# Patient Record
Sex: Male | Born: 1940
Health system: Southern US, Community
[De-identification: ages and names within clinical notes are randomized; demographics above are authoritative.]

## PROBLEM LIST (undated history)

## (undated) DIAGNOSIS — I219 Acute myocardial infarction, unspecified: Secondary | ICD-10-CM

## (undated) DIAGNOSIS — F0390 Unspecified dementia without behavioral disturbance: Secondary | ICD-10-CM

## (undated) DIAGNOSIS — K529 Noninfective gastroenteritis and colitis, unspecified: Secondary | ICD-10-CM

## (undated) DIAGNOSIS — G459 Transient cerebral ischemic attack, unspecified: Secondary | ICD-10-CM

## (undated) DIAGNOSIS — B3781 Candidal esophagitis: Secondary | ICD-10-CM

## (undated) DIAGNOSIS — K922 Gastrointestinal hemorrhage, unspecified: Secondary | ICD-10-CM

## (undated) DIAGNOSIS — E119 Type 2 diabetes mellitus without complications: Secondary | ICD-10-CM

## (undated) DIAGNOSIS — E785 Hyperlipidemia, unspecified: Secondary | ICD-10-CM

## (undated) DIAGNOSIS — K5520 Angiodysplasia of colon without hemorrhage: Secondary | ICD-10-CM

## (undated) DIAGNOSIS — I10 Essential (primary) hypertension: Secondary | ICD-10-CM

## (undated) DIAGNOSIS — I2510 Atherosclerotic heart disease of native coronary artery without angina pectoris: Secondary | ICD-10-CM

## (undated) DIAGNOSIS — J984 Other disorders of lung: Secondary | ICD-10-CM

## (undated) DIAGNOSIS — G4730 Sleep apnea, unspecified: Secondary | ICD-10-CM

## (undated) DIAGNOSIS — I6523 Occlusion and stenosis of bilateral carotid arteries: Secondary | ICD-10-CM

## (undated) DIAGNOSIS — K3189 Other diseases of stomach and duodenum: Secondary | ICD-10-CM

## (undated) DIAGNOSIS — F32A Depression, unspecified: Secondary | ICD-10-CM

## (undated) DIAGNOSIS — N400 Enlarged prostate without lower urinary tract symptoms: Secondary | ICD-10-CM

## (undated) DIAGNOSIS — J449 Chronic obstructive pulmonary disease, unspecified: Secondary | ICD-10-CM

## (undated) DIAGNOSIS — D649 Anemia, unspecified: Secondary | ICD-10-CM

## (undated) DIAGNOSIS — K766 Portal hypertension: Secondary | ICD-10-CM

## (undated) DIAGNOSIS — K219 Gastro-esophageal reflux disease without esophagitis: Secondary | ICD-10-CM

## (undated) DIAGNOSIS — F329 Major depressive disorder, single episode, unspecified: Secondary | ICD-10-CM

## (undated) HISTORY — DX: Gastrointestinal hemorrhage, unspecified: K92.2

## (undated) HISTORY — DX: Atherosclerotic heart disease of native coronary artery without angina pectoris: I25.10

## (undated) HISTORY — DX: Occlusion and stenosis of bilateral carotid arteries: I65.23

## (undated) HISTORY — DX: Candidal esophagitis: B37.81

## (undated) HISTORY — DX: Type 2 diabetes mellitus without complications: E11.9

## (undated) HISTORY — PX: OTHER SURGICAL HISTORY: SHX169

## (undated) HISTORY — DX: Gastro-esophageal reflux disease without esophagitis: K21.9

## (undated) HISTORY — DX: Depression, unspecified: F32.A

## (undated) HISTORY — PX: CORONARY ANGIOPLASTY WITH STENT PLACEMENT: SHX49

## (undated) HISTORY — DX: Anemia, unspecified: D64.9

## (undated) HISTORY — PX: COLONOSCOPY: SHX174

## (undated) HISTORY — DX: Essential (primary) hypertension: I10

## (undated) HISTORY — PX: CORONARY ARTERY BYPASS GRAFT: SHX141

## (undated) HISTORY — DX: Other disorders of lung: J98.4

## (undated) HISTORY — DX: Benign prostatic hyperplasia without lower urinary tract symptoms: N40.0

## (undated) HISTORY — DX: Noninfective gastroenteritis and colitis, unspecified: K52.9

## (undated) HISTORY — PX: CARDIAC CATHETERIZATION: SHX172

## (undated) HISTORY — DX: Major depressive disorder, single episode, unspecified: F32.9

## (undated) HISTORY — DX: Angiodysplasia of colon without hemorrhage: K55.20

## (undated) HISTORY — DX: Transient cerebral ischemic attack, unspecified: G45.9

## (undated) HISTORY — DX: Hyperlipidemia, unspecified: E78.5

---

## 2000-10-17 ENCOUNTER — Emergency Department (HOSPITAL_COMMUNITY): Admission: EM | Admit: 2000-10-17 | Discharge: 2000-10-17 | Payer: Self-pay | Admitting: Emergency Medicine

## 2005-12-21 ENCOUNTER — Emergency Department (HOSPITAL_COMMUNITY): Admission: EM | Admit: 2005-12-21 | Discharge: 2005-12-21 | Payer: Self-pay | Admitting: Emergency Medicine

## 2006-05-04 ENCOUNTER — Inpatient Hospital Stay (HOSPITAL_COMMUNITY): Admission: EM | Admit: 2006-05-04 | Discharge: 2006-05-07 | Payer: Self-pay | Admitting: Emergency Medicine

## 2006-05-04 ENCOUNTER — Ambulatory Visit: Payer: Self-pay | Admitting: Internal Medicine

## 2006-05-04 ENCOUNTER — Ambulatory Visit: Payer: Self-pay | Admitting: Cardiology

## 2006-05-05 ENCOUNTER — Ambulatory Visit: Payer: Self-pay | Admitting: Gastroenterology

## 2006-05-05 ENCOUNTER — Encounter (INDEPENDENT_AMBULATORY_CARE_PROVIDER_SITE_OTHER): Payer: Self-pay | Admitting: Specialist

## 2006-05-07 ENCOUNTER — Ambulatory Visit: Payer: Self-pay | Admitting: Gastroenterology

## 2006-05-15 ENCOUNTER — Emergency Department (HOSPITAL_COMMUNITY): Admission: EM | Admit: 2006-05-15 | Discharge: 2006-05-15 | Payer: Self-pay | Admitting: Emergency Medicine

## 2006-05-21 ENCOUNTER — Ambulatory Visit (HOSPITAL_COMMUNITY): Admission: RE | Admit: 2006-05-21 | Discharge: 2006-05-21 | Payer: Self-pay | Admitting: Gastroenterology

## 2006-05-27 ENCOUNTER — Ambulatory Visit: Payer: Self-pay | Admitting: Gastroenterology

## 2006-06-22 HISTORY — PX: COLONOSCOPY: SHX174

## 2006-06-24 ENCOUNTER — Ambulatory Visit: Payer: Self-pay | Admitting: *Deleted

## 2006-06-28 ENCOUNTER — Ambulatory Visit: Payer: Self-pay | Admitting: Gastroenterology

## 2006-07-12 ENCOUNTER — Encounter (INDEPENDENT_AMBULATORY_CARE_PROVIDER_SITE_OTHER): Payer: Self-pay | Admitting: Specialist

## 2006-07-12 ENCOUNTER — Ambulatory Visit (HOSPITAL_COMMUNITY): Admission: RE | Admit: 2006-07-12 | Discharge: 2006-07-12 | Payer: Self-pay | Admitting: Gastroenterology

## 2006-07-21 ENCOUNTER — Ambulatory Visit: Payer: Self-pay | Admitting: Gastroenterology

## 2007-01-13 ENCOUNTER — Ambulatory Visit: Payer: Self-pay | Admitting: Gastroenterology

## 2007-01-20 ENCOUNTER — Ambulatory Visit: Payer: Self-pay | Admitting: *Deleted

## 2007-03-24 HISTORY — PX: OTHER SURGICAL HISTORY: SHX169

## 2007-06-21 ENCOUNTER — Ambulatory Visit: Payer: Self-pay | Admitting: Gastroenterology

## 2007-08-08 ENCOUNTER — Ambulatory Visit (HOSPITAL_COMMUNITY): Admission: RE | Admit: 2007-08-08 | Discharge: 2007-08-08 | Payer: Self-pay | Admitting: Internal Medicine

## 2007-08-10 ENCOUNTER — Encounter (HOSPITAL_COMMUNITY): Admission: RE | Admit: 2007-08-10 | Discharge: 2007-09-09 | Payer: Self-pay | Admitting: Internal Medicine

## 2007-08-11 ENCOUNTER — Emergency Department (HOSPITAL_COMMUNITY): Admission: EM | Admit: 2007-08-11 | Discharge: 2007-08-11 | Payer: Self-pay | Admitting: Emergency Medicine

## 2007-08-11 ENCOUNTER — Ambulatory Visit (HOSPITAL_COMMUNITY): Payer: Self-pay | Admitting: Oncology

## 2007-08-17 ENCOUNTER — Ambulatory Visit: Payer: Self-pay | Admitting: Gastroenterology

## 2007-10-13 ENCOUNTER — Ambulatory Visit: Payer: Self-pay | Admitting: Gastroenterology

## 2007-10-14 ENCOUNTER — Encounter (HOSPITAL_COMMUNITY): Admission: RE | Admit: 2007-10-14 | Discharge: 2007-11-13 | Payer: Self-pay | Admitting: Gastroenterology

## 2007-10-26 ENCOUNTER — Ambulatory Visit (HOSPITAL_COMMUNITY): Admission: RE | Admit: 2007-10-26 | Discharge: 2007-10-26 | Payer: Self-pay | Admitting: Gastroenterology

## 2007-10-30 ENCOUNTER — Ambulatory Visit: Payer: Self-pay | Admitting: Gastroenterology

## 2007-11-22 HISTORY — PX: ESOPHAGOGASTRODUODENOSCOPY: SHX1529

## 2007-11-22 HISTORY — PX: UPPER GASTROINTESTINAL ENDOSCOPY: SHX188

## 2007-11-23 ENCOUNTER — Ambulatory Visit (HOSPITAL_COMMUNITY): Payer: Self-pay | Admitting: Internal Medicine

## 2007-11-23 ENCOUNTER — Encounter (HOSPITAL_COMMUNITY): Admission: RE | Admit: 2007-11-23 | Discharge: 2007-12-19 | Payer: Self-pay | Admitting: Internal Medicine

## 2007-11-23 ENCOUNTER — Ambulatory Visit: Payer: Self-pay | Admitting: Gastroenterology

## 2007-11-30 ENCOUNTER — Ambulatory Visit: Payer: Self-pay | Admitting: Gastroenterology

## 2007-11-30 ENCOUNTER — Ambulatory Visit (HOSPITAL_COMMUNITY): Admission: RE | Admit: 2007-11-30 | Discharge: 2007-11-30 | Payer: Self-pay | Admitting: Gastroenterology

## 2007-11-30 ENCOUNTER — Encounter: Payer: Self-pay | Admitting: Gastroenterology

## 2007-12-06 ENCOUNTER — Ambulatory Visit (HOSPITAL_COMMUNITY): Admission: RE | Admit: 2007-12-06 | Discharge: 2007-12-06 | Payer: Self-pay | Admitting: Family Medicine

## 2008-02-22 ENCOUNTER — Ambulatory Visit: Payer: Self-pay | Admitting: Gastroenterology

## 2008-05-21 HISTORY — PX: OTHER SURGICAL HISTORY: SHX169

## 2008-05-25 ENCOUNTER — Encounter: Payer: Self-pay | Admitting: Gastroenterology

## 2008-05-25 ENCOUNTER — Ambulatory Visit: Payer: Self-pay | Admitting: Gastroenterology

## 2008-05-25 DIAGNOSIS — K5900 Constipation, unspecified: Secondary | ICD-10-CM | POA: Insufficient documentation

## 2008-05-25 DIAGNOSIS — R197 Diarrhea, unspecified: Secondary | ICD-10-CM | POA: Insufficient documentation

## 2008-05-25 DIAGNOSIS — K219 Gastro-esophageal reflux disease without esophagitis: Secondary | ICD-10-CM | POA: Insufficient documentation

## 2008-05-25 DIAGNOSIS — D500 Iron deficiency anemia secondary to blood loss (chronic): Secondary | ICD-10-CM | POA: Insufficient documentation

## 2008-05-31 ENCOUNTER — Telehealth (INDEPENDENT_AMBULATORY_CARE_PROVIDER_SITE_OTHER): Payer: Self-pay | Admitting: *Deleted

## 2008-06-08 ENCOUNTER — Encounter: Payer: Self-pay | Admitting: Gastroenterology

## 2008-06-08 LAB — CONVERTED CEMR LAB
Ferritin: 8 ng/mL — ABNORMAL LOW (ref 22–322)
HCT: 37.5 % — ABNORMAL LOW (ref 39.0–52.0)
Hemoglobin: 11.3 g/dL — ABNORMAL LOW (ref 13.0–17.0)

## 2008-06-18 ENCOUNTER — Ambulatory Visit (HOSPITAL_COMMUNITY): Admission: RE | Admit: 2008-06-18 | Discharge: 2008-06-18 | Payer: Self-pay | Admitting: Gastroenterology

## 2008-06-20 ENCOUNTER — Ambulatory Visit: Payer: Self-pay | Admitting: Gastroenterology

## 2008-07-06 ENCOUNTER — Encounter: Payer: Self-pay | Admitting: Gastroenterology

## 2008-07-09 ENCOUNTER — Encounter: Payer: Self-pay | Admitting: Gastroenterology

## 2008-07-19 ENCOUNTER — Emergency Department (HOSPITAL_COMMUNITY): Admission: EM | Admit: 2008-07-19 | Discharge: 2008-07-19 | Payer: Self-pay | Admitting: Emergency Medicine

## 2008-07-20 ENCOUNTER — Telehealth (INDEPENDENT_AMBULATORY_CARE_PROVIDER_SITE_OTHER): Payer: Self-pay

## 2008-07-22 ENCOUNTER — Emergency Department (HOSPITAL_COMMUNITY): Admission: EM | Admit: 2008-07-22 | Discharge: 2008-07-22 | Payer: Self-pay | Admitting: Emergency Medicine

## 2008-08-06 ENCOUNTER — Encounter (INDEPENDENT_AMBULATORY_CARE_PROVIDER_SITE_OTHER): Payer: Self-pay

## 2008-08-17 ENCOUNTER — Encounter: Payer: Self-pay | Admitting: Gastroenterology

## 2008-08-21 ENCOUNTER — Telehealth: Payer: Self-pay | Admitting: Gastroenterology

## 2008-09-10 ENCOUNTER — Encounter: Payer: Self-pay | Admitting: Gastroenterology

## 2008-09-12 LAB — CONVERTED CEMR LAB: Hemoglobin: 11.3 g/dL — ABNORMAL LOW (ref 13.0–17.0)

## 2008-09-13 DIAGNOSIS — K200 Eosinophilic esophagitis: Secondary | ICD-10-CM | POA: Insufficient documentation

## 2008-09-13 DIAGNOSIS — K7689 Other specified diseases of liver: Secondary | ICD-10-CM | POA: Insufficient documentation

## 2008-09-13 DIAGNOSIS — F17200 Nicotine dependence, unspecified, uncomplicated: Secondary | ICD-10-CM | POA: Insufficient documentation

## 2008-09-13 DIAGNOSIS — D126 Benign neoplasm of colon, unspecified: Secondary | ICD-10-CM | POA: Insufficient documentation

## 2008-09-13 DIAGNOSIS — K648 Other hemorrhoids: Secondary | ICD-10-CM | POA: Insufficient documentation

## 2008-09-13 DIAGNOSIS — E119 Type 2 diabetes mellitus without complications: Secondary | ICD-10-CM | POA: Insufficient documentation

## 2008-09-19 ENCOUNTER — Ambulatory Visit: Payer: Self-pay | Admitting: Gastroenterology

## 2008-09-20 ENCOUNTER — Encounter: Payer: Self-pay | Admitting: Gastroenterology

## 2008-09-20 DIAGNOSIS — Q279 Congenital malformation of peripheral vascular system, unspecified: Secondary | ICD-10-CM | POA: Insufficient documentation

## 2008-09-27 ENCOUNTER — Ambulatory Visit (HOSPITAL_COMMUNITY): Payer: Self-pay | Admitting: Oncology

## 2008-09-27 ENCOUNTER — Encounter (HOSPITAL_COMMUNITY): Admission: RE | Admit: 2008-09-27 | Discharge: 2008-10-27 | Payer: Self-pay | Admitting: Oncology

## 2008-12-03 ENCOUNTER — Ambulatory Visit (HOSPITAL_COMMUNITY): Payer: Self-pay | Admitting: Oncology

## 2008-12-03 ENCOUNTER — Encounter (HOSPITAL_COMMUNITY): Admission: RE | Admit: 2008-12-03 | Discharge: 2008-12-19 | Payer: Self-pay | Admitting: Oncology

## 2009-01-16 ENCOUNTER — Ambulatory Visit: Payer: Self-pay | Admitting: Gastroenterology

## 2009-03-04 ENCOUNTER — Ambulatory Visit (HOSPITAL_COMMUNITY): Payer: Self-pay | Admitting: Oncology

## 2009-03-04 ENCOUNTER — Encounter (HOSPITAL_COMMUNITY): Admission: RE | Admit: 2009-03-04 | Discharge: 2009-03-20 | Payer: Self-pay | Admitting: Oncology

## 2009-04-17 ENCOUNTER — Encounter (HOSPITAL_COMMUNITY): Admission: RE | Admit: 2009-04-17 | Discharge: 2009-05-17 | Payer: Self-pay | Admitting: Oncology

## 2009-04-19 ENCOUNTER — Ambulatory Visit (HOSPITAL_COMMUNITY): Payer: Self-pay | Admitting: Oncology

## 2009-05-07 ENCOUNTER — Ambulatory Visit: Payer: Self-pay | Admitting: Gastroenterology

## 2009-06-21 ENCOUNTER — Ambulatory Visit (HOSPITAL_COMMUNITY): Payer: Self-pay | Admitting: Oncology

## 2009-06-21 ENCOUNTER — Encounter (HOSPITAL_COMMUNITY): Admission: RE | Admit: 2009-06-21 | Discharge: 2009-07-21 | Payer: Self-pay | Admitting: Oncology

## 2009-08-23 ENCOUNTER — Ambulatory Visit (HOSPITAL_COMMUNITY): Payer: Self-pay | Admitting: Oncology

## 2009-08-23 ENCOUNTER — Encounter (HOSPITAL_COMMUNITY): Admission: RE | Admit: 2009-08-23 | Discharge: 2009-09-22 | Payer: Self-pay | Admitting: Oncology

## 2009-09-04 ENCOUNTER — Encounter: Payer: Self-pay | Admitting: Gastroenterology

## 2009-10-18 ENCOUNTER — Ambulatory Visit (HOSPITAL_COMMUNITY): Payer: Self-pay | Admitting: Oncology

## 2009-10-18 ENCOUNTER — Encounter (HOSPITAL_COMMUNITY)
Admission: RE | Admit: 2009-10-18 | Discharge: 2009-11-17 | Payer: Self-pay | Source: Home / Self Care | Admitting: Oncology

## 2009-11-20 ENCOUNTER — Ambulatory Visit: Payer: Self-pay | Admitting: Gastroenterology

## 2009-11-20 DIAGNOSIS — K589 Irritable bowel syndrome without diarrhea: Secondary | ICD-10-CM | POA: Insufficient documentation

## 2009-11-21 DIAGNOSIS — K529 Noninfective gastroenteritis and colitis, unspecified: Secondary | ICD-10-CM

## 2009-11-21 HISTORY — DX: Noninfective gastroenteritis and colitis, unspecified: K52.9

## 2009-12-04 ENCOUNTER — Ambulatory Visit (HOSPITAL_COMMUNITY): Payer: Self-pay | Admitting: Oncology

## 2009-12-04 ENCOUNTER — Encounter (HOSPITAL_COMMUNITY)
Admission: RE | Admit: 2009-12-04 | Discharge: 2010-01-03 | Payer: Self-pay | Source: Home / Self Care | Admitting: Oncology

## 2009-12-16 ENCOUNTER — Encounter: Payer: Self-pay | Admitting: *Deleted

## 2009-12-17 ENCOUNTER — Encounter: Payer: Self-pay | Admitting: Cardiology

## 2009-12-17 ENCOUNTER — Encounter (INDEPENDENT_AMBULATORY_CARE_PROVIDER_SITE_OTHER): Payer: Self-pay | Admitting: *Deleted

## 2009-12-24 ENCOUNTER — Ambulatory Visit: Payer: Self-pay | Admitting: Cardiology

## 2009-12-25 ENCOUNTER — Encounter: Payer: Self-pay | Admitting: Cardiology

## 2010-01-02 ENCOUNTER — Encounter: Payer: Self-pay | Admitting: Gastroenterology

## 2010-01-03 ENCOUNTER — Ambulatory Visit: Payer: Self-pay | Admitting: Cardiology

## 2010-01-03 ENCOUNTER — Encounter: Payer: Self-pay | Admitting: Cardiology

## 2010-01-03 ENCOUNTER — Encounter (HOSPITAL_COMMUNITY)
Admission: RE | Admit: 2010-01-03 | Discharge: 2010-02-02 | Payer: Self-pay | Source: Home / Self Care | Admitting: Cardiology

## 2010-01-07 ENCOUNTER — Encounter: Payer: Self-pay | Admitting: Cardiology

## 2010-01-08 ENCOUNTER — Encounter (INDEPENDENT_AMBULATORY_CARE_PROVIDER_SITE_OTHER): Payer: Self-pay | Admitting: *Deleted

## 2010-01-08 ENCOUNTER — Encounter: Payer: Self-pay | Admitting: Cardiology

## 2010-01-15 ENCOUNTER — Ambulatory Visit: Payer: Self-pay | Admitting: Cardiology

## 2010-01-21 ENCOUNTER — Encounter: Payer: Self-pay | Admitting: Cardiology

## 2010-01-29 ENCOUNTER — Encounter: Payer: Self-pay | Admitting: Cardiology

## 2010-02-12 ENCOUNTER — Encounter: Payer: Self-pay | Admitting: Cardiology

## 2010-02-21 ENCOUNTER — Ambulatory Visit (HOSPITAL_COMMUNITY): Payer: Self-pay | Admitting: Oncology

## 2010-02-21 ENCOUNTER — Encounter (HOSPITAL_COMMUNITY)
Admission: RE | Admit: 2010-02-21 | Discharge: 2010-03-23 | Payer: Self-pay | Source: Home / Self Care | Attending: Oncology | Admitting: Oncology

## 2010-02-27 ENCOUNTER — Emergency Department (HOSPITAL_COMMUNITY): Admission: EM | Admit: 2010-02-27 | Discharge: 2009-12-07 | Payer: Self-pay | Admitting: Emergency Medicine

## 2010-03-25 ENCOUNTER — Ambulatory Visit
Admission: RE | Admit: 2010-03-25 | Discharge: 2010-03-25 | Payer: Self-pay | Source: Home / Self Care | Attending: Gastroenterology | Admitting: Gastroenterology

## 2010-03-25 ENCOUNTER — Telehealth (INDEPENDENT_AMBULATORY_CARE_PROVIDER_SITE_OTHER): Payer: Self-pay

## 2010-04-02 ENCOUNTER — Encounter (HOSPITAL_COMMUNITY)
Admission: RE | Admit: 2010-04-02 | Discharge: 2010-04-22 | Payer: Self-pay | Source: Home / Self Care | Attending: Oncology | Admitting: Oncology

## 2010-04-22 NOTE — Miscellaneous (Signed)
Summary: echo 01/03/2010  Clinical Lists Changes  Observations: Added new observation of ECHOINTERP:  Study Conclusions    - Left ventricle: The cavity size was normal. Wall thickness was     increased in a pattern of moderate LVH. The estimated ejection     fraction was 55%. Probable hypokinesis of the basal inferolateral     myocardium. The study is not technically sufficient to allow     evaluation of LV diastolic function.   - Aortic valve: Mildly calcified annulus. Mildly calcified leaflets.   - Mitral valve: Mild regurgitation.   - Left atrium: The atrium was moderately dilated.   - Tricuspid valve: Mild regurgitation.   - Pulmonary arteries: Systolic pressure was mildly increased.   - Pericardium, extracardiac: There was no pericardial effusion.  (01/03/2010 9:37)      Echocardiogram  Procedure date:  01/03/2010  Findings:       Study Conclusions    - Left ventricle: The cavity size was normal. Wall thickness was     increased in a pattern of moderate LVH. The estimated ejection     fraction was 55%. Probable hypokinesis of the basal inferolateral     myocardium. The study is not technically sufficient to allow     evaluation of LV diastolic function.   - Aortic valve: Mildly calcified annulus. Mildly calcified leaflets.   - Mitral valve: Mild regurgitation.   - Left atrium: The atrium was moderately dilated.   - Tricuspid valve: Mild regurgitation.   - Pulmonary arteries: Systolic pressure was mildly increased.   - Pericardium, extracardiac: There was no pericardial effusion.

## 2010-04-22 NOTE — Letter (Signed)
Summary: Appointment - Missed  [place], [place]    Phone:   Fax:      December 17, 2009 MRN: [id number]   Donald Gaines [place], [place]  [address]   Dear Donald Gaines,  Our records indicate you missed your appointment on        12/17/09                with Dr.       .       MCDOWELL                             It is very important that we reach you to reschedule this appointment. We look forward to participating in your health care needs. Please contact us at the number listed above at your earliest convenience to reschedule this appointment.     Sincerely,    Public relations account executive

## 2010-04-22 NOTE — Letter (Signed)
Summary: DR Nevada Crane RECORDS  DR [facility]   Imported By: Alphonsus Sias [facility] 12/17/2009 16:50:16  _____________________________________________________________________  External Attachment:    Type:   Image     Comment:   External Document

## 2010-04-22 NOTE — Letter (Signed)
Summary: LABS 12/06/09  LABS 12/06/09   Imported By: Nevada Crane 12/25/2009 09:46:18  _____________________________________________________________________  External Attachment:    Type:   Image     Comment:   External Document

## 2010-04-22 NOTE — Letter (Signed)
Summary: DR Nevada Crane OFFICE NOTE 12/13/09  DR HALL OFFICE NOTE 12/13/09   Imported By: Nevada Crane 12/25/2009 09:47:07  _____________________________________________________________________  External Attachment:    Type:   Image     Comment:   External Document

## 2010-04-22 NOTE — Letter (Signed)
Summary: [facility]   Imported By: Hoy Morn 01/02/2010 10:41:40  _____________________________________________________________________  External Attachment:    Type:   Image     Comment:   External Document

## 2010-04-22 NOTE — Assessment & Plan Note (Signed)
Summary: **np6-full cardiac work up disc [facility]   Visit Type:  Initial Consult Primary Tyller Bowlby:  Dr. Wende Neighbors   History of Present Illness: 70 year old male referred for cardiology consultation, having missed his original visit. Records indicate that the patient has followed from a cardiac perspective at [facility] in the past, Dr. Quentin Cornwall, and has also been seen by Dr. Wynonia Lawman as well as in consultation by Dr. Lattie Haw. He states that he was due to see Dr. Quentin Cornwall yesterday for a followup, in light of his emergency department visit back in September, however decided to cancel this and establish care with our practice.  He was seen in the emergency department back in mid September reporting progressive orthopnea and lower extremity edema as well as intermittent chest pain. He had to get up out of bed and sit up to catch his breath, and started to use nitroglycerin more regularly. Lasix dosing was increased and he reports improvement in symptoms with only a single sublingual nitroglycerin since that time. Does not appear that he has had any followup cardiac structural or ischemic evaluation.  Recent labs from September showed CK-MB 2.0, total CK 93, sodium 142, potassium 3.7, BUN 11, creatinine 0.8, AST 14, ALT 10, hemoglobin 13.0, platelets 29, troponin I 0.02, BNP 487. Followup labs more recently showed a BNP 41, potassium 4.1, BUN 12, creatinine 0.8.  He is very inactive, stating that he "sits around" most of the time. No regular exercise. Reports arthritic leg pain. No palpitations or syncope. He has not been on regular aspirin since prior trouble with GI bleeding. He has been able to tolerate Plavix.  We discussed smoking cessation. He made an attempt the past, quitting for 3 months. He has used both Chantix and Wellbutrin SR in the past. He does state that he has been thinking about quitting, not yet prepared for a quit date however. We discussed the Quit Line and also strategies to help him  stop smoking.   Preventive Screening-Counseling & Management  Alcohol-Tobacco     Smoking Status: current  Current Medications (verified): 1)  Plavix 75 Mg Tabs (Clopidogrel Bisulfate) .... Qd 2)  Omeprazole 20 Mg  Cpdr (Omeprazole) .Marland Kitchen.. 1 30 Minutes Before Meals Twice Daily 3)  Isosorbide Mononitrate Cr 60 Mg Xr24h-Tab (Isosorbide Mononitrate) .... Take 1 Tablet By Mouth Once A Day 4)  Janumet 50-1000 Mg Tabs (Sitagliptin-Metformin Hcl) .... Take 1 Tablet By Mouth Two Times A Day 5)  Glimepiride 2 Mg Tabs (Glimepiride) .... Take One Tablet in The Am 6)  Furosemide 40 Mg Tabs (Furosemide) .... Take 1 Tablet By Mouth Once A Day 7)  Crestor 20 Mg Tabs (Rosuvastatin Calcium) .... Take 1 Tablet By Mouth Once A Day 8)  Carvedilol 6.25 Mg Tabs (Carvedilol) .... Take 1 Tablet By Mouth Two Times A Day 9)  Paroxetine Hcl 30 Mg Tabs (Paroxetine Hcl) .... Take 1 Tablet By Mouth Once A Day 10)  Flomax 0.4 Mg Xr24h-Cap (Tamsulosin Hcl) .... Take 1 Capsule By Mouth At Bedtime 11)  Avodart 0.5 Mg Caps (Dutasteride) .... Take 1 Capsule By Mouth Once A Day 12)  Advair Diskus .... Take Two Times A Day 13)  Proair .... As Directed 14)  Cetirizine Hcl 10 Mg Tabs (Cetirizine Hcl) .... Take 1 Tablet By Mouth Once A Day 15)  Alprazolam 0.5 Mg Tabs (Alprazolam) .... One Tablet At Bedtime and One During Day As Needed 16)  Vicodin 5-500 Mg Tabs (Hydrocodone-Acetaminophen) .... As Needed 17)  Potassium .... As Directed  18)  Losartan Potassium 25 Mg Tabs (Losartan Potassium) .... Take 1 Tablet By Mouth Once A Day  Allergies (verified): 1)  ! Penicillin  Comments:  Nurse/Medical Assistant: reviewed meds with patient and wife reviewed med list from emergency room visit   Past History:  Family History: Last updated: 12/24/2009 No FH of Colon Cancer Mother: died with hypertensive crisis Siblings: 2 brothers suffered fatal myocardial infarctions  Social History: Last updated: 12/24/2009 Married Tobacco  Use - Yes. Alcohol Use - no (former history of abuse)  Past Medical History: TIA 2005 GERD CAD - multivessel, BMS SVG to RCA 2000, reportedly "2" additional stents in the interim Hyperlipidemia Hypertension Bilateral carotid artery stenosis Diabetes Type 2 Melena & anemia **EGD/TCS 2008x2-Duodenal lipoma, SIMPLE ADENOMA,  two large cecal ulcer ?from ASA, simple adenoma, ASA held & restarted 3/09,  **MAY 2009: Hb 8.5, MCV 77.9, FERRITIN 5-->EGD/TCS/ablation: SEP 2009: cecal ulcer resolved,  AVMS-ascending colon, duodenum, and stomach, nl gasric and duodenal mucosa, OCT 2009: HB 10.9, MCV 79 **CAPSULE ENDOSCOPY: MAR 2010-multiple small bowel AVMs, no active bleeding Hemorrhoids IBS-m **2009: HYDROGEN BREATH TEST/GES for NVBLOATING: NL Candida esophagitis 2009 Renal and hepatic cysts CT 2008: LEFT ADRENAL ADENOMA CT 2009: L adrena ladenoma unchanged, Gallstones, fatty liver  Past Surgical History: Right inguinal hernia repair CABG 1994  Family History: No FH of Colon Cancer Mother: died with hypertensive crisis Siblings: 2 brothers suffered fatal myocardial infarctions  Social History: Married Tobacco Use - Yes. Alcohol Use - no (former history of abuse)  Review of Systems       The patient complains of dyspnea on exertion.  The patient denies anorexia, fever, chest pain, syncope, peripheral edema, prolonged cough, hemoptysis, melena, hematochezia, and severe indigestion/heartburn.         Otherwise reviewed and negative except as outlined.  Vital Signs:  Patient profile:   70 year old male Weight:      208 pounds BMI:     33.69 Pulse rate:   69 / minute BP sitting:   146 / 78  (right arm)  Vitals Entered By: Doretha Sou, CNA (December 24, 2009 9:00 AM)  Physical Exam  Additional Exam:  Obese male no acute distress. HEENT: Conjunctiva and lids normal, oropharynx with poor dentition. Neck: Supple, no elevated JVP or bruits, or thyromegaly. Lungs: Clear to  auscultation, nonlabored. Cardiac: Regular rate and rhythm, indistinct PMI, soft basal systolic murmur, no S3 gallop. Abdomen: Protuberant, obese, bowel sounds present, no tenderness. Skin: Warm and dry, scattered tattoos noted. Extremities: Trace ankle edema, distal pulses one plus. Musculoskeletal: No kyphosis. Neuropsychiatric: Alert and oriented x3, affect appropriate.   Echocardiogram  Procedure date:  05/04/2006  Findings:       M-MODE:  Aorta 3.3, left atrium 5.1, septum 1.6, posterior wall 1.6, LV  diastole 4.5, LV systole 3.4.   IMPRESSION:  1. Technically suboptimal but adequate echocardiographic study.  2. Moderate left atrial enlargement; normal right atrial size.  3. Normal right ventricular size and function; moderate RVH.  4. Normal mitral valve; mild annular calcification.  5. Mild sclerosis of a trileaflet aortic valve.  6. Normal internal dimension of the proximal ascending aorta; mild      calcification of the wall and annulus.  7. Normal tricuspid valve with physiologic regurgitation.  8. Normal pulmonic valve and proximal pulmonary artery.  9. Normal left ventricular size; moderate hypertrophy; normal regional      and global function.  Carotid Doppler  Procedure date:  01/20/2007  Findings:  IMPRESSION:  20-39% ICA stenosis bilaterally   EKG  Procedure date:  12/24/2009  Findings:      Sinus rhythm at 65 beats per minutes with inferolateral T-wave changes, possibly repolarization abnormalities, rule out ischemia or evidence of prior infarct. Occasional fusion beats.  Impression & Recommendations:  Problem # 1:  CORONARY ATHEROSCLEROSIS NATIVE CORONARY ARTERY (ICD-414.01)  History of multivessel CAD based on limited information, status post previous bypass surgery in 1994 with subsequent percutaneous interventions, details pending. Most of his cardiology care over the years has been through [facility]. No recent ischemic or structural cardiac  evaluation. In light of symptoms noted below, plan will be to proceed with a 2-D echocardiogram as well as a Lexiscan Myoview on medical therapy. I will bring him back to the office to discuss the results and additional plans. Encouraged him to seek urgent medical attention if his symptoms suddenly escalate.  His updated medication list for this problem includes:    Plavix 75 Mg Tabs (Clopidogrel bisulfate) ..... Qd    Isosorbide Mononitrate Cr 60 Mg Xr24h-tab (Isosorbide mononitrate) .Marland Kitchen... Take 1 tablet by mouth once a day    Carvedilol 6.25 Mg Tabs (Carvedilol) .Marland Kitchen... Take 1 tablet by mouth two times a day  Orders: 2-D Echocardiogram (2D Echo) Nuclear Stress Test (Nuc Stress Test)  Problem # 2:  SHORTNESS OF BREATH (ICD-786.05)  Symptoms consistent with element of volume overload, perhaps also ischemic in etiology. Last LVEF was in normal range as of 2008, not more recently assessed as best I can tell. BNP level was also modestly increased recently. Continue Lasix at 40 mg p.o. b.i.d., and follow up with 2-D echocardiogram as noted above.  His updated medication list for this problem includes:    Furosemide 40 Mg Tabs (Furosemide) .Marland Kitchen... Take 1 tablet by mouth once a day    Carvedilol 6.25 Mg Tabs (Carvedilol) .Marland Kitchen... Take 1 tablet by mouth two times a day    Losartan Potassium 25 Mg Tabs (Losartan potassium) .Marland Kitchen... Take 1 tablet by mouth once a day  Orders: 2-D Echocardiogram (2D Echo) Nuclear Stress Test (Nuc Stress Test)  Problem # 3:  CAROTID ARTERY STENOSIS (ICD-433.10)  Nonobstructive by Doppler study in 2008. Concurrently on Plavix.  His updated medication list for this problem includes:    Plavix 75 Mg Tabs (Clopidogrel bisulfate) ..... Qd  Problem # 4:  TOBACCO ABUSE (ICD-305.1)  We discussed smoking cessation today as described.  Problem # 5:  HYPERLIPIDEMIA (B2193296.4)  Continues on Crestor. Has been followed by Dr. Nevada Crane.  His updated medication list for this problem  includes:    Crestor 20 Mg Tabs (Rosuvastatin calcium) .Marland Kitchen... Take 1 tablet by mouth once a day  Problem # 6:  HYPERTENSION (ICD-401.9)  Blood pressure elevated today. He will likely need further medication titration over time. Also discussed sodium restriction.  His updated medication list for this problem includes:    Furosemide 40 Mg Tabs (Furosemide) .Marland Kitchen... Take 1 tablet by mouth once a day    Carvedilol 6.25 Mg Tabs (Carvedilol) .Marland Kitchen... Take 1 tablet by mouth two times a day    Losartan Potassium 25 Mg Tabs (Losartan potassium) .Marland Kitchen... Take 1 tablet by mouth once a day  Patient Instructions: 1)  Your physician recommends that you schedule a follow-up appointment in: 3 weeks 2)  Your physician recommends that you continue on your current medications as directed. Please refer to the Current Medication list given to you today. 3)  Your physician has requested that you  have an echocardiogram.  Echocardiography is a painless test that uses sound waves to create images of your heart. It provides your doctor with information about the size and shape of your heart and how well your heart's chambers and valves are working.  This procedure takes approximately one hour. There are no restrictions for this procedure. 4)  Your physician has requested that you have an [employer].  For further information please visit [email or web address].  Please follow instruction sheet, as given.

## 2010-04-22 NOTE — Letter (Signed)
Summary: LABS BY DR Nevada Crane  LABS BY DR Nevada Crane   Imported By: Nevada Crane 02/12/2010 13:06:45  _____________________________________________________________________  External Attachment:    Type:   Image     Comment:   External Document

## 2010-04-22 NOTE — Letter (Signed)
Summary: [facility]  [facility] RECORDS   Imported By: Nevada Crane 01/07/2010 16:47:53  _____________________________________________________________________  External Attachment:    Type:   Image     Comment:   External Document

## 2010-04-22 NOTE — Letter (Signed)
Summary: Mona Treadmill ([place])  [facility] [place]. [address], [place] [address]   Phone: [phone]  Fax: [phone]    Nuclear Medicine 1-Day Stress Test Information Sheet  Re:     KERION WIEBELHAUS   DOB:     Aug 10, 1940 MRN:     [id number] Weight:  Appointment Date: Register at: Appointment Time: Referring MD:  ___Exercise Stress  __Adenosine   __Dobutamine  _X_Lexiscan  __Persantine   __Thallium  Urgency: ____1 (next day)   ____2 (one week)    ____3 (PRN)  Patient will receive Follow Up call with results: Patient needs follow-up appointment:  Instructions regarding medication:  How to prepare for your stress test: 1. DO NOT eat or dring 6 hours prior to your arrival time. This includes no caffeine (coffee, tea, sodas, chocolate) if you were instructed to take your medications, drink water with it. 2. DO NOT use any tobacco products for at leaset 8 hours prior to arrival. 3. DO NOT wear dresses or any clothing that may have metal clasps or buttons. 4. Wear short sleeve shirts, loose clothing, and comfortalbe walking shoes. 5. DO NOT use lotions, oils or powder on your chest before the test. 6. The test will take approximately 3-4 hours from the time you arrive until completion. 7. To register the day of the test, go to the Short Stay entrance at [facility]. 8. If you must cancel your test, call [phone] as soon as you are aware. 9. Please do not take Furosemide and Glimepiride the morning of test.  After you arrive for test:   When you arrive at [facility], you will go to Short Stay to be registered. They will then send you to Radiology to check in. The Nuclear Medicine Tech will get you and start an IV in your arm or hand. A small amount of a radioactive tracer will then be injected into your IV. This tracer will then have to circulate for 30-45 minutes. During this time you will wait in the waiting room and you will be able  to drink something without caffeine. A series of pictures will be taken of your heart follwoing this waiting period. After the 1st set of pictures you will go to the stress lab to get ready for your stress test. During the stress test, another small amount of a radioactive tracer will be injected through your IV. When the stress test is complete, there is a short rest period while your heart rate and blood pressure will be monitored. When this monitoring period is complete you will have another set of pictrues taken. (The same as the 1st set of pictures). These pictures are taken between 15 minutes and 1 hour after the stress test. The time depends on the type of stress test you had. Your doctor will inform you of your test results within 7 days after test.    The possibilities of certain changes are possible during the test. They include abnormal blood pressure and disorders of the heart. Side effects of persantine or adenosine can include flushing, chest pain, shortness of breath, stomach tightness, headache and light-headedness. These side effects usually do not last long and are self-resolving. Every effort will be made to keep you comfortable and to minimize complications by obtaining a medical history and by close observation during the test. Emergency equipment, medications, and trained personnel are available to deal with any unusual situation which may arise.  Please notify office  at least 48 hours in advance if you are unable to keep this appt.

## 2010-04-22 NOTE — Letter (Signed)
Summary: External Other  External Other   Imported By: Sherry Ruffing 09/04/2009 14:43:41  _____________________________________________________________________  External Attachment:    Type:   Image     Comment:   External Document

## 2010-04-22 NOTE — Letter (Signed)
Summary: [facility], general practice at [place]. [address], [place] [address]   Phone: [phone]  Fax: [phone]      January 21, 2010 MRN: [id number]   LEONIDAS DUNSTAN [place], [place]  [address]   Dear Mr. Ojo,  Your test ordered by Rande Lawman has been reviewed by your physician (or physician assistant) and was found to be normal or stable. Your physician (or physician assistant) felt no changes were needed at this time.  ____ Echocardiogram  ____ Cardiac Stress Test  ____ Lab Work  __X__ Peripheral vascular study of arms, legs or neck  ____ CT scan or X-ray  ____ Lung or Breathing test  ____ Other:  Please continue on current medical treatment.  Thank you.   Rozann Lesches, MD, F.A.C.C

## 2010-04-22 NOTE — Assessment & Plan Note (Signed)
Summary: 3 wk f/uper checkout on 12/24/09/tg   Visit Type:  Follow-up Primary Donald Gaines:  Dr. Wende Neighbors   History of Present Illness: 70 year old male presents for followup. He was seen as a new patient earlier in the month. Outside cardiac records as well as followup cardiac testing are reviewed below. He has followed with Dr. Quentin Cornwall of [facility], last seen in October 2010. Previous followup Myoview had been in 2007, details not entirely clear.  He is here with his wife today. I reviewed the results of his stress testing and echocardiogram. The studies are consistent with his history of CAD, although symptomatically he reports no major changes. He has occasional angina and nitroglycerin use, NYHA class 2-3 dyspnea exertion the setting of ongoing tobacco use.  He is interested in quitting smoking. We have discussed this before. He plans to call the Quit Line to help him work out a quit date. Use of Wellbutrin SR and nicotine patches would be a good option for him. He seems to be fairly motivated. Has support from his wife.  I discussed with him continuing medical therapy at this point, although we could certainly consider further assessment if he manifests progressive symptomatology.  Current Medications (verified): 1)  Plavix 75 Mg Tabs (Clopidogrel Bisulfate) .... Qd 2)  Omeprazole 20 Mg  Cpdr (Omeprazole) .Marland Kitchen.. 1 30 Minutes Before Meals Twice Daily 3)  Isosorbide Mononitrate Cr 60 Mg Xr24h-Tab (Isosorbide Mononitrate) .... Take 1 Tablet By Mouth Once A Day 4)  Janumet 50-1000 Mg Tabs (Sitagliptin-Metformin Hcl) .... Take 1 Tablet By Mouth Two Times A Day 5)  Glimepiride 2 Mg Tabs (Glimepiride) .... Take One Tablet in The Am 6)  Furosemide 40 Mg Tabs (Furosemide) .... Take 1 Tablet By Mouth Once A Day 7)  Crestor 20 Mg Tabs (Rosuvastatin Calcium) .... Take 1 Tablet By Mouth Once A Day 8)  Carvedilol 6.25 Mg Tabs (Carvedilol) .... Take 1 Tablet By Mouth Two Times A Day 9)  Paroxetine Hcl 30 Mg  Tabs (Paroxetine Hcl) .... Take 1 Tablet By Mouth Once A Day 10)  Flomax 0.4 Mg Xr24h-Cap (Tamsulosin Hcl) .... Take 1 Capsule By Mouth At Bedtime 11)  Avodart 0.5 Mg Caps (Dutasteride) .... Take 1 Capsule By Mouth Once A Day 12)  Advair Diskus .... Take Two Times A Day 13)  Proair .... As Directed 14)  Cetirizine Hcl 10 Mg Tabs (Cetirizine Hcl) .... Take 1 Tablet By Mouth Once A Day 15)  Alprazolam 0.5 Mg Tabs (Alprazolam) .... One Tablet At Bedtime and One During Day As Needed 16)  Vicodin 5-500 Mg Tabs (Hydrocodone-Acetaminophen) .... As Needed 17)  Potassium .... As Directed 18)  Losartan Potassium 25 Mg Tabs (Losartan Potassium) .... Take 1 Tablet By Mouth Once A Day  Allergies: 1)  ! Penicillin  Comments:  Nurse/Medical Assistant: patient reviewed med list from previous ov and stated all meds are correct  Past History:  Past Surgical History: Last updated: 12/24/2009 Right inguinal hernia repair CABG 1994  Social History: Last updated: 12/24/2009 Married Tobacco Use - Yes. Alcohol Use - no (former history of abuse)  Past Medical History: TIA 2005 GERD CAD - multivessel, BMS SVG to RCA 2000, reportedly "2" additional stents in the interim Hyperlipidemia Hypertension Bilateral carotid artery stenosis Paralyzed right hemidiaphragm Depression Diabetes Type 2 Restrictive lung disease  Melena & anemia **EGD/TCS 2008x2-Duodenal lipoma, SIMPLE ADENOMA,  two large cecal ulcer ?from ASA, simple adenoma, ASA held & restarted 3/09,  **MAY 2009: Hb 8.5, MCV 77.9, FERRITIN  5-->EGD/TCS/ablation: SEP 2009: cecal ulcer resolved,  AVMS-ascending colon, duodenum, and stomach, nl gasric and duodenal mucosa, OCT 2009: HB 10.9, MCV 79 **CAPSULE ENDOSCOPY: MAR 2010-multiple small bowel AVMs, no active bleeding Hemorrhoids IBS-m **2009: HYDROGEN BREATH TEST/GES for NVBLOATING: NL Candida esophagitis 2009 Renal and hepatic cysts CT 2008: LEFT ADRENAL ADENOMA CT 2009: L adrena  ladenoma unchanged, Gallstones, fatty liver  Review of Systems       The patient complains of dyspnea on exertion.  The patient denies anorexia, fever, weight loss, chest pain, syncope, peripheral edema, prolonged cough, melena, and hematochezia.         Otherwise reviewed and negative.  Vital Signs:  Patient profile:   70 year old male Weight:      203 pounds BMI:     32.88 Pulse rate:   65 / minute BP sitting:   118 / 74  (right arm)  Vitals Entered By: Doretha Sou, CNA (January 15, 2010 1:47 PM)  Physical Exam  Additional Exam:  Obese male no acute distress. HEENT: Conjunctiva and lids normal, oropharynx with poor dentition. Neck: Supple, no elevated JVP or bruits, or thyromegaly. Lungs: Clear to auscultation, nonlabored. Cardiac: Regular rate and rhythm, indistinct PMI, soft basal systolic murmur, no S3 gallop. Abdomen: Protuberant, obese, bowel sounds present, no tenderness. Skin: Warm and dry, scattered tattoos noted. Extremities: Trace ankle edema, distal pulses one plus. Musculoskeletal: No kyphosis. Neuropsychiatric: Alert and oriented x3, affect appropriate.   Echocardiogram  Procedure date:  01/03/2010  Findings:       Study Conclusions    - Left ventricle: The cavity size was normal. Wall thickness was     increased in a pattern of moderate LVH. The estimated ejection     fraction was 55%. Probable hypokinesis of the basal inferolateral     myocardium. The study is not technically sufficient to allow     evaluation of LV diastolic function.   - Aortic valve: Mildly calcified annulus. Mildly calcified leaflets.   - Mitral valve: Mild regurgitation.   - Left atrium: The atrium was moderately dilated.   - Tricuspid valve: Mild regurgitation.   - Pulmonary arteries: Systolic pressure was mildly increased.   - Pericardium, extracardiac: There was no pericardial effusion.  Nuclear Study  Procedure date:  01/03/2010  Findings:      IMPRESSION:    Abnormal Lexiscan Myoview as outlined.  There were no clearly   diagnostic ST-segment changes noted.  Perfusion data show evidence   of probable scar affecting the mid to basal inferolateral wall with   mild periinfarct ischemia, also potential element of scar in the   basal anterior wall.  LVEF is 53% with suggestion of inferior   hypokinesis to akinesis.  This would be consistent with underlying   CAD.  Impression & Recommendations:  Problem # 1:  CORONARY ATHEROSCLEROSIS NATIVE CORONARY ARTERY (ICD-414.01)  Records reviewed from [facility] Quentin [facility] at [facility]. Also went over followup echocardiogram and Myoview. He has evidence of ischemic heart disease, predominately with element of scar in the mid to basal inferolateral wall, mild peri-infarct ischemia, and LVEF 53%. Medical therapy is fairly good. Blood pressure and heart rate are stable. At this point plan to continue observation on medical therapy. We will see him back in 6 months, sooner if needed.  His updated medication list for this problem includes:    Plavix 75 Mg Tabs (Clopidogrel bisulfate) ..... Qd    Isosorbide Mononitrate Cr 60 Mg Xr24h-tab (Isosorbide mononitrate) .Marland Kitchen... Take 1 tablet  by mouth once a day    Carvedilol 6.25 Mg Tabs (Carvedilol) .Marland Kitchen... Take 1 tablet by mouth two times a day  Problem # 2:  TOBACCO ABUSE (ICD-305.1)  He seems motivated to quit. Quit Line number was given. Next step is picking a quit date, proceeded 2 weeks by initiation of Wellbutrin SR, and then use of nicotine patches on his actual quit date. He seems to have fairly good support.  Problem # 3:  CAROTID ARTERY STENOSIS (ICD-433.10)  This has not been assessed in some time. We plan a followup carotid duplex.  His updated medication list for this problem includes:    Plavix 75 Mg Tabs (Clopidogrel bisulfate) ..... Qd  Orders: Carotid Duplex (Carotid Duplex)  Problem # 4:  HYPERTENSION (ICD-401.9)  Blood pressure well controlled today.  His  updated medication list for this problem includes:    Furosemide 40 Mg Tabs (Furosemide) .Marland Kitchen... Take 1 tablet by mouth once a day    Carvedilol 6.25 Mg Tabs (Carvedilol) .Marland Kitchen... Take 1 tablet by mouth two times a day    Losartan Potassium 25 Mg Tabs (Losartan potassium) .Marland Kitchen... Take 1 tablet by mouth once a day  Patient Instructions: 1)  Your physician recommends that you schedule a follow-up appointment in: 6 months 2)  Your physician recommends that you continue on your current medications as directed. Please refer to the Current Medication list given to you today. 3)  Your physician discussed the hazards of tobacco use.  Tobacco use cessation is recommended and techniques and options to help you quit were discussed. 4)  Your physician has requested that you have a carotid duplex. This test is an ultrasound of the carotid arteries in your neck. It looks at blood flow through these arteries that supply the brain with blood. Allow one hour for this exam. There are no restrictions or special instructions.

## 2010-04-22 NOTE — Letter (Signed)
Summary: External Other  External Other   Imported By: Sherry Ruffing 09/04/2009 14:03:49  _____________________________________________________________________  External Attachment:    Type:   Image     Comment:   External Document

## 2010-04-22 NOTE — Letter (Signed)
Summary: TRANSTHORACIC  TRANSTHORACIC   Imported By: Nevada Crane 01/08/2010 10:49:36  _____________________________________________________________________  External Attachment:    Type:   Image     Comment:   External Document

## 2010-04-22 NOTE — Letter (Signed)
Summary: FAX TO [facility] FOR RECORDS  FAX TO [facility] FOR RECORDS   Imported By: Nevada Crane 01/07/2010 13:05:20  _____________________________________________________________________  External Attachment:    Type:   Image     Comment:   External Document

## 2010-04-22 NOTE — Assessment & Plan Note (Signed)
Summary: IBS, AVMS   Visit Type:  Follow-up Visit Primary Care Provider:  Wende Neighbors, M.D.  Chief Complaint:  F/U.  History of Present Illness: No blood in his stools. Having lower abdpain and up into L side. No problems with urinating. Flomax helping. Buining when he passes his urine: 3x/week. Has dark urine but no red blood in urine. Bms: 3 times a day. Happens right before bms-pain better after BMs. Feeling sick in stomach because everytime he eats 15 mins later in the BR w/ diarrhea. Feels like a gas build up and then explosive BMs. Last labs: ??  Still getting iron IV. Still eating ice cream. ? non-dairy creamer with coffee. Cheese: no.  Current Medications (verified): 1)  Plavix 75 Mg Tabs (Clopidogrel Bisulfate) .... Qd 2)  Omeprazole 20 Mg  Cpdr (Omeprazole) .Marland Kitchen.. 1 30 Minutes Before Meals Twice Daily 3)  Isosorbide Mononitrate Cr 60 Mg Xr24h-Tab (Isosorbide Mononitrate) .... Take 1 Tablet By Mouth Once A Day 4)  Janumet 50-1000 Mg Tabs (Sitagliptin-Metformin Hcl) .... Take 1 Tablet By Mouth Two Times A Day 5)  Glimepiride 2 Mg Tabs (Glimepiride) .... Take One Tablet in The Am 6)  Furosemide 40 Mg Tabs (Furosemide) .... Take 1 Tablet By Mouth Once A Day 7)  Crestor 20 Mg Tabs (Rosuvastatin Calcium) .... Take 1 Tablet By Mouth Once A Day 8)  Carvedilol 6.25 Mg Tabs (Carvedilol) .... Take 1 Tablet By Mouth Two Times A Day 9)  Paroxetine Hcl 30 Mg Tabs (Paroxetine Hcl) .... Take 1 Tablet By Mouth Once A Day 10)  Flomax 0.4 Mg Xr24h-Cap (Tamsulosin Hcl) .... Take 1 Capsule By Mouth At Bedtime 11)  Avodart 0.5 Mg Caps (Dutasteride) .... Take 1 Capsule By Mouth Once A Day 12)  Advair Diskus .... Take Two Times A Day 13)  Proair .... As Directed 14)  Cetirizine Hcl 10 Mg Tabs (Cetirizine Hcl) .... Take 1 Tablet By Mouth Once A Day 15)  Alprazolam 0.5 Mg Tabs (Alprazolam) .... One Tablet At Bedtime and One During Day As Needed 16)  Vicodin 5-500 Mg Tabs (Hydrocodone-Acetaminophen) .... As  Needed 17)  Potassium .... As Directed 18)  Losartan Potassium 25 Mg Tabs (Losartan Potassium) .... Take 1 Tablet By Mouth Once A Day  Allergies (verified): 1)  ! Penicillin  Past History:  Past Medical History: Last updated: 05/07/2009 Diabetes GERD CAD/STENT-on Plavix indefinitely Melena & anemia **EGD/TCS 2008x2-Duodenal lipoma, SIMPLE ADENOMA,  two large cecal ulcer ?from ASA, simple adenoma, ASA held & restarted 3/09,  **MAY 2009: Hb 8.5, MCV 77.9, FERRITIN 5-->EGD/TCS/ablation: SEP 2009: cecal ulcer resolved,  AVMS-ascending colon, duodenum, and stomach, nl gasric and duodenal mucosa, OCT 2009: HB 10.9, MCV 79 **CAPSULE ENDOSCOPY: MAR 2010-multiple small bowel AVMs, no active bleeding Hemorrhoids IBS-m **2009: HYDROGEN BREATH TEST/GES for NVBLOATING: NL Hyperlipidemia Hypertension Bilateral carotid artery stenosis Candida esophagitis 2009 Renal and hepatic cysts CT 2008: LEFT ADRENAL ADENOMA CT 2009: L adrena ladenoma unchanged, Gallstones, fatty liver  Review of Systems       JUNE 2011: HB 13.1  MCV 94      FERRITIN 98     PLT 189  JULY 2011: HB 13.2 MCV 96.2    FERRITIN 166   PLT 133  Vital Signs:  Patient profile:   70 year old male Height:      66 inches Weight:      205 pounds BMI:     33.21 Temp:     97.9 degrees F oral Pulse rate:  64 / minute BP sitting:   138 / 80  (left arm) Cuff size:   regular  Vitals Entered By: Waldon Merl LPN (August 31, 624THL 9:32 AM)  Physical Exam  General:  Well developed, well nourished, no acute distress. Head:  Normocephalic and atraumatic. Eyes:  PERRL, no icterus. Mouth:  No deformity or lesions. Neck:  Supple; no masses. Lungs:  Clear throughout to auscultation. Heart:  Regular rate and rhythm; no murmurs Abdomen:  Soft, nontender and nondistended. Normal bowel sounds. Extremities:  No edema or deformities noted. Neurologic:  Alert and  oriented x4;  grossly normal neurologically.  Impression &  Recommendations:  Problem # 1:  IBS (ICD-564.1) Assessment Unchanged Mixed pattern and may be exacerbated by dairy intake. FOLLOW A DAIRY FREE/HIGH FIBER Diabetic diet. SEE HOs. Take Calcium every morning with meals. Follow up in 6 mos.  Problem # 2:  ARTERIOVENOUS MALFORMATION (ICD-747.60) No active bleeding. Obtain labs from Dr. Nevada Crane. OPV in 6 mos.  CC: Drs. Neijstrom and Hall  Patient Instructions: 1)  FOLLOW A DAIRY FREE/HIGH FIBER Diabetic diet. SEE HOs. 2)  Take Calcium every morning with meals. 3)  Follow up in 6 mos. 4)  The medication list was reviewed and reconciled.  All changed / newly prescribed medications were explained.  A complete medication list was provided to the patient / caregiver.  Appended Document: Orders Update    Clinical Lists Changes  Orders: Added new Service order of Est. Patient Level IV [id number]) - Signed      Appended Document: IBS, AVMS 6 MONTH F/U OPV IS IN THE COMPUTER

## 2010-04-22 NOTE — Letter (Signed)
Summary: [facility]  [facility]   Imported By: Nevada Crane 01/29/2010 11:52:21  _____________________________________________________________________  External Attachment:    Type:   Image     Comment:   External Document

## 2010-04-22 NOTE — Assessment & Plan Note (Signed)
Summary: OCCULT GIB/GI AVMs   Visit Type:  Follow-up Visit Primary Care Provider:  Wende Neighbors  Chief Complaint:  "Losing iron".  History of Present Illness: Not seeing any blood. Appetite: not good. BM: "runs every AM", 1-2x. No blood in stool, or black stool that looks like tar. Took off iron pills and started infusions 2 weeks ago. Eats [employer] PB ice cream but not every day.  Current Medications (verified): 1)  Plavix 75 Mg Tabs (Clopidogrel Bisulfate) .... Qd 2)  Omeprazole 20 Mg  Cpdr (Omeprazole) .Marland Kitchen.. 1 30 Minutes Before Meals Twice Daily 3)  Isosorbide Mononitrate Cr 60 Mg Xr24h-Tab (Isosorbide Mononitrate) .... Take 1 Tablet By Mouth Once A Day 4)  Janumet 50-1000 Mg Tabs (Sitagliptin-Metformin Hcl) .... Take 1 Tablet By Mouth Two Times A Day 5)  Glimepiride 2 Mg Tabs (Glimepiride) .... Take One Tablet in The Am 6)  Furosemide 40 Mg Tabs (Furosemide) .... Take 1 Tablet By Mouth Once A Day 7)  Crestor 20 Mg Tabs (Rosuvastatin Calcium) .... Take 1 Tablet By Mouth Once A Day 8)  Carvedilol 6.25 Mg Tabs (Carvedilol) .... Take 1 Tablet By Mouth Once A Day 9)  Cyclobenzaprine Hcl 10 Mg Tabs (Cyclobenzaprine Hcl) .... Take 1 Tablet By Mouth Two Times A Day 10)  Paroxetine Hcl 30 Mg Tabs (Paroxetine Hcl) .... Take 1 Tablet By Mouth Once A Day 11)  Flomax 0.4 Mg Xr24h-Cap (Tamsulosin Hcl) .... Take 1 Capsule By Mouth At Bedtime 12)  Avodart 0.5 Mg Caps (Dutasteride) .... Take 1 Capsule By Mouth Once A Day 13)  Advair Diskus .... Take Two Times A Day 14)  Proair .... As Directed 15)  Cetirizine Hcl 10 Mg Tabs (Cetirizine Hcl) .... Take 1 Tablet By Mouth Once A Day 16)  Alprazolam 0.5 Mg Tabs (Alprazolam) .... 1/2 To One Tablet Two Times A Day As Needed 17)  Vicodin 5-500 Mg Tabs (Hydrocodone-Acetaminophen) .... Take 1 Tablet At Bedtime 18)  Potassium .... As Directed  Allergies (verified): 1)  ! Penicillin  Past History:  Past Medical History: Diabetes GERD CAD/STENT-on Plavix  indefinitely Melena & anemia **EGD/TCS 2008x2-Duodenal lipoma, SIMPLE ADENOMA,  two large cecal ulcer ?from ASA, simple adenoma, ASA held & restarted 3/09,  **MAY 2009: Hb 8.5, MCV 77.9, FERRITIN 5-->EGD/TCS/ablation: SEP 2009: cecal ulcer resolved,  AVMS-ascending colon, duodenum, and stomach, nl gasric and duodenal mucosa, OCT 2009: HB 10.9, MCV 79 **CAPSULE ENDOSCOPY: MAR 2010-multiple small bowel AVMs, no active bleeding Hemorrhoids IBS-m **2009: HYDROGEN BREATH TEST/GES for NVBLOATING: NL Hyperlipidemia Hypertension Bilateral carotid artery stenosis Candida esophagitis 2009 Renal and hepatic cysts CT 2008: LEFT ADRENAL ADENOMA CT 2009: L adrena ladenoma unchanged, Gallstones, fatty liver  Family History: Reviewed history from 01/16/2009 and no changes required. No FH of Colon Cancer  Physical Exam  General:  Well developed, well nourished, no acute distress. Head:  Normocephalic and atraumatic. Eyes:  PERRLA, no icterus. Mouth:  No deformity or lesions. Neck:  Supple; no masses. Lungs:  Clear throughout to auscultation. Heart:  Regular rate and rhythm; no murmurs Abdomen:  Soft, nontender and nondistended. Normal bowel sounds. Neurologic:  Alert and  oriented x4;  grossly normal neurologically.  Impression & Recommendations:  Problem # 1:  ABDOMINAL BLOATING (ICD-787.3) and pain likley 2o to lactose intolerance, IBS-M, AND/OR Diabetic enteropathy. Add Digestive Adv LI daily when available. OPV in 6 mos.   ADDENDUM [id number]: PT CONTACTED. Will pick up DA-LI.  Problem # 2:  ANEMIA (N067566.9) Assessment: Unchanged 2o to occult GIB/GI  AVMs. Continue iron infusions. Obtain labs from Dr. Tressie Stalker. If unable to maintain Hb, then will need evaluation and ?DBE at [facility]. EGD or TCS at [facility] f t has active bleeding.  CC: PCP and Dr. Ivin Poot  Problem # 3:  COLONIC POLYPS, ADENOMATOUS, BENIGN (ICD-569.0) Assessment: Comment Only TCS 2019.  Other Orders: Est. Patient Level III  [id number])    Appended Document: OCCULT GIB/GI AVMs DEC 2011: HB 12.8 PLT 236  CR 4.78 NL HFP FERRITIN 31 Mar 2009: HB 11.3 PLT 248 FERRITIN 6

## 2010-04-24 NOTE — Progress Notes (Signed)
Summary: RX REFILL  Phone Note Call from Patient Call back at Home Phone [phone]   Caller: PT WIFE Summary of Call: PT IS [place], [place], [place], [place]. [facility]  Initial call taken by: Nevada Crane,  March 25, 2010 1:17 PM  Follow-up for Phone Call        Pt. aware. Follow-up by: Jeani Hawking Via LPN,  January  3, [id number] 2:30 PM    Prescriptions: CRESTOR 20 MG TABS (ROSUVASTATIN CALCIUM) Take 1 tablet by mouth once a day  #30 x 4   Entered by:   Jeani Hawking Via LPN   Authorized by:   Beckie Salts, MD, [facility]   Signed by:   Jeani Hawking Via LPN on 075-GRM   Method used:   [facility]        [id number]       [address], [place]  [address]       Ph: [phone] or [phone]       Fax: [phone]   RxID:   [id number] ISOSORBIDE MONONITRATE CR 60 MG XR24H-TAB (ISOSORBIDE MONONITRATE) Take 1 tablet by mouth once a day  #30 x 4   Entered by:   Jeani Hawking Via LPN   Authorized by:   Beckie Salts, MD, [facility]   Signed by:   Jeani Hawking Via LPN on 075-GRM   Method used:   [facility]        [id number]       [address]       [place], [place]  [address]       Ph: [phone] or [phone]       Fax: [phone]   RxID:   [id number] PLAVIX 75 MG TABS (CLOPIDOGREL BISULFATE) qd  #30 x 4   Entered by:   Jeani Hawking Via LPN   Authorized by:   Beckie Salts, MD, [facility]   Signed by:   Jeani Hawking Via LPN on 075-GRM   Method used:   [facility]ronically to        [id number]       [address]       [place], [place]  [address]       Ph: [phone] or [phone]       Fax: [phone]   RxID:   [id number]

## 2010-04-24 NOTE — Assessment & Plan Note (Signed)
Summary: WT LOSS,DIARRHEA,INCONTINENCE/SS   Visit Type:  Follow-up Visit Primary Care Provider:  Dr. Wende Neighbors  CC:  weight loss and diarrhea.  History of Present Illness: Donald Gaines presents today in follow-up, hx of IBS and IDA. Has been drinking cream in coffee but avoiding other types of milk products such as milk, icecream, etc. Last seen August 2011 with Dr. Oneida Alar. States feels IBS has worsened since last visit. Yesterday avoided creamer, reports no diarrhea. Today lost check book, got nervous, then had diarrhea. Reports normally has several loose stool daily, no brbpr, no melena. Lower abdominal cramp, relieved after using bathroom. Described as feeling like "trapped gas". No supplemental fiber. Not taking calcium as instructed at last visit.   Current Medications (verified): 1)  Plavix 75 Mg Tabs (Clopidogrel Bisulfate) .... Qd 2)  Omeprazole 20 Mg  Cpdr (Omeprazole) .Marland Kitchen.. 1 30 Minutes Before Meals Twice Daily 3)  Isosorbide Mononitrate Cr 60 Mg Xr24h-Tab (Isosorbide Mononitrate) .... Take 1 Tablet By Mouth Once A Day 4)  Janumet 50-1000 Mg Tabs (Sitagliptin-Metformin Hcl) .... Take 1 Tablet By Mouth Two Times A Day 5)  Glimepiride 2 Mg Tabs (Glimepiride) .... Take One Tablet in The Am 6)  Furosemide 40 Mg Tabs (Furosemide) .... Take 1 Tablet By Mouth Once A Day 7)  Crestor 20 Mg Tabs (Rosuvastatin Calcium) .... Take 1 Tablet By Mouth Once A Day 8)  Carvedilol 6.25 Mg Tabs (Carvedilol) .... Take 1 Tablet By Mouth Two Times A Day 9)  Paroxetine Hcl 30 Mg Tabs (Paroxetine Hcl) .... Take 1 Tablet By Mouth Once A Day 10)  Flomax 0.4 Mg Xr24h-Cap (Tamsulosin Hcl) .... Take 1 Capsule By Mouth At Bedtime 11)  Avodart 0.5 Mg Caps (Dutasteride) .... Take 1 Capsule By Mouth Once A Day 12)  Advair Diskus .... Take Two Times A Day 13)  Proair .... As Directed 14)  Alprazolam 0.5 Mg Tabs (Alprazolam) .... One Tablet At Bedtime and One During Day As Needed 15)  Vicodin 5-500 Mg Tabs  (Hydrocodone-Acetaminophen) .... As Needed 16)  Potassium .... As Directed 17)  Losartan Potassium 25 Mg Tabs (Losartan Potassium) .... Take 1 Tablet By Mouth Once A Day  Allergies (verified): 1)  ! Penicillin  Past History:  Past Medical History: Last updated: 01/15/2010 TIA 2005 GERD CAD - multivessel, BMS SVG to RCA 2000, reportedly "2" additional stents in the interim Hyperlipidemia Hypertension Bilateral carotid artery stenosis Paralyzed right hemidiaphragm Depression Diabetes Type 2 Restrictive lung disease  Melena & anemia **EGD/TCS 2008x2-Duodenal lipoma, SIMPLE ADENOMA,  two large cecal ulcer ?from ASA, simple adenoma, ASA held & restarted 3/09,  **MAY 2009: Hb 8.5, MCV 77.9, FERRITIN 5-->EGD/TCS/ablation: SEP 2009: cecal ulcer resolved,  AVMS-ascending colon, duodenum, and stomach, nl gasric and duodenal mucosa, OCT 2009: HB 10.9, MCV 79 **CAPSULE ENDOSCOPY: MAR 2010-multiple small bowel AVMs, no active bleeding Hemorrhoids IBS-m **2009: HYDROGEN BREATH TEST/GES for NVBLOATING: NL Candida esophagitis 2009 Renal and hepatic cysts CT 2008: LEFT ADRENAL ADENOMA CT 2009: L adrena ladenoma unchanged, Gallstones, fatty liver  Review of Systems General:  Denies fever, chills, and anorexia. Eyes:  Denies blurring, irritation, and discharge. ENT:  Denies sore throat, hoarseness, and difficulty swallowing. CV:  Denies chest pains and syncope. Resp:  Denies dyspnea at rest and wheezing. GI:  Complains of diarrhea; denies difficulty swallowing, pain on swallowing, nausea, indigestion/heartburn, abdominal pain, bloody BM's, and black BMs. GU:  Denies urinary burning and urinary frequency. MS:  Denies joint pain / LOM, joint swelling, and joint  stiffness. Derm:  Denies rash, itching, and dry skin. Neuro:  Denies weakness and syncope. Psych:  Denies depression and anxiety. Endo:  Denies cold intolerance and heat intolerance. Heme:  Denies bruising and bleeding.  Vital  Signs:  Patient profile:   70 year old male Height:      66 inches Weight:      202 pounds BMI:     32.72 Temp:     97.7 degrees F oral Pulse rate:   68 / minute BP sitting:   120 / 80  (left arm) Cuff size:   regular  Vitals Entered By: Burnadette Peter LPN (January  3, [id number] 9:17 AM)  Physical Exam  General:  Well developed, well nourished, no acute distress. Head:  Normocephalic and atraumatic. Eyes:  sclera without icterus, conjuctiva clear Mouth:  No deformity or lesions, dentition normal. Lungs:  Clear throughout to auscultation. Heart:  Regular rate and rhythm; no murmurs, rubs,  or bruits. Abdomen:  normal bowel sounds, obese, without guarding, without rebound, no tenderness, no masses, and no hepatomegally or splenomegaly.   Msk:  Symmetrical with no gross deformities. Normal posture. Pulses:  Normal pulses noted. Extremities:  No clubbing, cyanosis, edema or deformities noted. Neurologic:  Alert and  oriented x4;  grossly normal neurologically. Skin:  Intact without significant lesions or rashes. Psych:  Alert and cooperative. Normal mood and affect.  Impression & Recommendations:  Problem # 1:  IBS (ICD-59.52)  70 year old Caucasian male with hx of IBS, mixed symptoms, feels stool frequency increased since last visit in August. Denies brbpr or melena. Lower abdominal cramping relieved by BM. Continues to use dairy creamer, yet had no diarrhea yesterday when avoided. Not taking fiber or calcium as instructed at last visit.  Calcium two tabs daily Fiber of choice daily (metamucil samples given) Avoid dairy, including coffee creamer Return in 3 mos  Orders: Est. Patient Level II [id number])  Patient Instructions: 1)  Take 2 tums daily in the morning  2)  Supplemental fiber of choice daily (gave Metamucil samples) 3)  Avoid creamer in coffee, follow lactose-free diet 4)  Return in 3 mos 5)  The medication list was reviewed and reconciled.  All changed / newly prescribed  medications were explained.  A complete medication list was provided to the patient / caregiver.   Appended Document: WT LOSS,DIARRHEA,INCONTINENCE/SS 3 MONTH F/U OPV IS IN THE COMPUTER

## 2010-05-09 ENCOUNTER — Encounter (INDEPENDENT_AMBULATORY_CARE_PROVIDER_SITE_OTHER): Payer: Self-pay | Admitting: *Deleted

## 2010-05-14 ENCOUNTER — Telehealth (INDEPENDENT_AMBULATORY_CARE_PROVIDER_SITE_OTHER): Payer: Self-pay

## 2010-05-14 NOTE — Letter (Signed)
Summary: Recall Office Visit  [facility]  [address]   [place], [place] [address]   Phone: [phone]  Fax: [phone]      May 09, 2010   Donald Gaines [address] [place], [place]  [address] [birth year]   Dear Mr. Helget,   According to our records, it is time for you to schedule a follow-up office visit with [place].   At your convenience, please call [phone] to schedule an office visit. If you have any questions, concerns, or feel that this letter is in error, we would appreciate your call.   Sincerely,    [facility] Ph: [phone]   Fax: [phone]

## 2010-05-20 NOTE — Progress Notes (Signed)
Summary: Plavix Questions  Phone Note Call from Patient   Caller: Patient Reason for Call: Talk to Nurse Summary of Call: pt would like to speak with nurse regarding Plavix / states he just to find out some information / states he has seen some commercials on TV about the side effects of it / tg Initial call taken by: Alphonsus Sias [facility],  May 14, 2010 4:04 PM  Follow-up for Phone Call        Pt. states he has been on Plavix since 2006 and is concerned about side effects of medication. He wants to know if he can stop taking. Please advise. Follow-up by: Jeani Hawking Via LPN,  February 22, [id number] 5:05 PM  Additional Follow-up for Phone Call Additional follow up Details #1::        In review of his records, seemingly he has tolerated Plavix fairly well. He has not however been on aspirin, with reported prior history of GI bleeding. If he stops Plavix, I would suggest that he start back on enteric coated aspirin, perhaps beginning at 81 mg daily and see if he tolerates it. Being on no antiplatelet therapy going forward would not be a good option with his known CAD and previous intervention. Additional Follow-up by: Beckie Salts, MD, [facility],  May 14, 2010 6:01 PM    Additional Follow-up for Phone Call Additional follow up Details #2::    Pt. advised that he can stop taking Plavix and start taking 81mg  enteric coated asa. He states he understands instructions given. Follow-up by: Jeani Hawking Via LPN,  February 23, [id number] 9:26 AM  New/Updated Medications: ASPIRIN 81 MG TBEC (ASPIRIN) Take one tablet by mouth daily

## 2010-05-27 ENCOUNTER — Encounter: Payer: Self-pay | Admitting: Gastroenterology

## 2010-06-02 ENCOUNTER — Ambulatory Visit (INDEPENDENT_AMBULATORY_CARE_PROVIDER_SITE_OTHER): Payer: Medicare Other | Admitting: Gastroenterology

## 2010-06-02 ENCOUNTER — Encounter: Payer: Self-pay | Admitting: Gastroenterology

## 2010-06-02 DIAGNOSIS — D509 Iron deficiency anemia, unspecified: Secondary | ICD-10-CM

## 2010-06-02 DIAGNOSIS — Q279 Congenital malformation of peripheral vascular system, unspecified: Secondary | ICD-10-CM

## 2010-06-02 DIAGNOSIS — K589 Irritable bowel syndrome without diarrhea: Secondary | ICD-10-CM

## 2010-06-02 LAB — CBC
Hemoglobin: 12.7 g/dL — ABNORMAL LOW (ref 13.0–17.0)
MCH: 33.2 pg (ref 26.0–34.0)
MCHC: 35.4 g/dL (ref 30.0–36.0)
MCV: 93.7 fL (ref 78.0–100.0)
Platelets: 164 10*3/uL (ref 150–400)
RBC: 3.83 MIL/uL — ABNORMAL LOW (ref 4.22–5.81)

## 2010-06-05 LAB — CBC
HCT: 35.9 % — ABNORMAL LOW (ref 39.0–52.0)
HCT: 37 % — ABNORMAL LOW (ref 39.0–52.0)
Hemoglobin: 12.4 g/dL — ABNORMAL LOW (ref 13.0–17.0)
Hemoglobin: 12.9 g/dL — ABNORMAL LOW (ref 13.0–17.0)
MCH: 33.6 pg (ref 26.0–34.0)
MCH: 33.8 pg (ref 26.0–34.0)
MCHC: 34.6 g/dL (ref 30.0–36.0)
MCV: 96.5 fL (ref 78.0–100.0)
Platelets: 186 10*3/uL (ref 150–400)
RBC: 3.83 MIL/uL — ABNORMAL LOW (ref 4.22–5.81)
RDW: 14 % (ref 11.5–15.5)
WBC: 5.9 10*3/uL (ref 4.0–10.5)

## 2010-06-05 LAB — BASIC METABOLIC PANEL
BUN: 10 mg/dL (ref 6–23)
Chloride: 105 mEq/L (ref 96–112)
Glucose, Bld: 144 mg/dL — ABNORMAL HIGH (ref 70–99)
Potassium: 3.4 mEq/L — ABNORMAL LOW (ref 3.5–5.1)

## 2010-06-05 LAB — POCT CARDIAC MARKERS: Myoglobin, poc: 81.4 ng/mL (ref 12–200)

## 2010-06-05 LAB — DIFFERENTIAL
Basophils Absolute: 0 10*3/uL (ref 0.0–0.1)
Basophils Relative: 1 % (ref 0–1)
Eosinophils Absolute: 0.1 10*3/uL (ref 0.0–0.7)
Monocytes Absolute: 0.5 10*3/uL (ref 0.1–1.0)
Monocytes Relative: 8 % (ref 3–12)
Neutro Abs: 3.9 10*3/uL (ref 1.7–7.7)

## 2010-06-07 LAB — CBC
HCT: 38.4 % — ABNORMAL LOW (ref 39.0–52.0)
Hemoglobin: 13.2 g/dL (ref 13.0–17.0)
MCH: 32.9 pg (ref 26.0–34.0)
MCHC: 34.3 g/dL (ref 30.0–36.0)
MCV: 96 fL (ref 78.0–100.0)
RBC: 4 MIL/uL — ABNORMAL LOW (ref 4.22–5.81)

## 2010-06-07 LAB — FERRITIN: Ferritin: 166 ng/mL (ref 22–322)

## 2010-06-08 LAB — CBC
HCT: 34.8 % — ABNORMAL LOW (ref 39.0–52.0)
Hemoglobin: 11.3 g/dL — ABNORMAL LOW (ref 13.0–17.0)
MCV: 85.2 fL (ref 78.0–100.0)
Platelets: 248 10*3/uL (ref 150–400)
RDW: 15.5 % (ref 11.5–15.5)

## 2010-06-09 LAB — CBC
MCHC: 33.8 g/dL (ref 30.0–36.0)
MCV: 94.8 fL (ref 78.0–100.0)
RBC: 4.09 MIL/uL — ABNORMAL LOW (ref 4.22–5.81)
RDW: 15.7 % — ABNORMAL HIGH (ref 11.5–15.5)

## 2010-06-09 LAB — FERRITIN: Ferritin: 98 ng/mL (ref 22–322)

## 2010-06-10 NOTE — Assessment & Plan Note (Signed)
Summary: IRON DEF/ANEMIA/LAW   Vital Signs:  Patient profile:   70 year old male Height:      66 inches Weight:      198.50 pounds BMI:     32.15 Temp:     97.7 degrees F oral Pulse rate:   60 / minute BP sitting:   160 / 100  (left arm)  Vitals Entered By: Loney Loh LPN (March 12, [id number] AM)  Visit Type:  f/u  Primary Care Provider:  Wende Neighbors  Chief Complaint:  IDA.  History of Present Illness: Patient here for six month f/u. H/O IDA from SB AVMs. Some breakthrough heartburn, but not often. BMs much better, less diarrhea. Three BMs daily. Only one episode of abd pain since last visit. BMs dark and light, no obvious blood. No significant weight loss. Taking Plavix but not ASA. Still receiving iron infusions. Recently had labs with Dr. Nevada Crane, will request records.  12/11-->ferritin 277, Hgb 12.7.  Current Medications (verified): 1)  Omeprazole 20 Mg  Cpdr (Omeprazole) .Marland Kitchen.. 1 30 Minutes Before Meals Twice Daily 2)  Isosorbide Mononitrate Cr 60 Mg Xr24h-Tab (Isosorbide Mononitrate) .... Take 1 Tablet By Mouth Once A Day 3)  Janumet 50-1000 Mg Tabs (Sitagliptin-Metformin Hcl) .... Take 1 Tablet By Mouth Two Times A Day 4)  Glimepiride 2 Mg Tabs (Glimepiride) .... Take One Tablet in The Am 5)  Furosemide 40 Mg Tabs (Furosemide) .... Take 1 Tablet By Mouth Once A Day 6)  Crestor 20 Mg Tabs (Rosuvastatin Calcium) .... Take 1 Tablet By Mouth Once A Day 7)  Carvedilol 6.25 Mg Tabs (Carvedilol) .... Take 1 Tablet By Mouth Two Times A Day 8)  Paroxetine Hcl 30 Mg Tabs (Paroxetine Hcl) .... Take 1 Tablet By Mouth Once A Day 9)  Flomax 0.4 Mg Xr24h-Cap (Tamsulosin Hcl) .... Take 1 Capsule By Mouth At Bedtime 10)  Avodart 0.5 Mg Caps (Dutasteride) .... Take 1 Capsule By Mouth Once A Day 11)  Advair Diskus .... Take Two Times A Day 12)  Proair .... As Directed 13)  Alprazolam 0.5 Mg Tabs (Alprazolam) .... One Tablet At Bedtime and One During Day As Needed 14)  Vicodin 5-500 Mg Tabs  (Hydrocodone-Acetaminophen) .... As Needed 15)  Potassium .... As Directed 16)  Losartan Potassium 25 Mg Tabs (Losartan Potassium) .... Take 1 Tablet By Mouth Once A Day 17)  Plavix 75 Mg Tabs (Clopidogrel Bisulfate) .... Take One Once Daily 18)  Nitrostat 0.4 Mg Subl (Nitroglycerin) .... Take One Under Tongue As Directed 19)  Fluoxetine Hcl 20 Mg Caps (Fluoxetine Hcl) .... Take One Two Times A Day 20)  Fluticasone Propionate 50 Mcg/act Susp (Fluticasone Propionate) .... Squirt Two Sprays in Each Nostrile As Directed Prn 21)  Vesicare 5 Mg Tabs (Solifenacin Succinate) .... Take One Once Daily  Allergies: 1)  ! Penicillin  Past History:  Past Medical History: Last updated: 01/15/2010 TIA 2005 GERD CAD - multivessel, BMS SVG to RCA 2000, reportedly "2" additional stents in the interim Hyperlipidemia Hypertension Bilateral carotid artery stenosis Paralyzed right hemidiaphragm Depression Diabetes Type 2 Restrictive lung disease  Melena & anemia **EGD/TCS 2008x2-Duodenal lipoma, SIMPLE ADENOMA,  two large cecal ulcer ?from ASA, simple adenoma, ASA held & restarted 3/09,  **MAY 2009: Hb 8.5, MCV 77.9, FERRITIN 5-->EGD/TCS/ablation: SEP 2009: cecal ulcer resolved,  AVMS-ascending colon, duodenum, and stomach, nl gasric and duodenal mucosa, OCT 2009: HB 10.9, MCV 79 **CAPSULE ENDOSCOPY: MAR 2010-multiple small bowel AVMs, no active bleeding Hemorrhoids IBS-m **2009: HYDROGEN BREATH  TEST/GES for NVBLOATING: NL Candida esophagitis 2009 Renal and hepatic cysts CT 2008: LEFT ADRENAL ADENOMA CT 2009: L adrena ladenoma unchanged, Gallstones, fatty liver  Review of Systems      See HPI  Physical Exam  General:  Well developed, well nourished, no acute distress. Head:  Normocephalic and atraumatic. Eyes:  sclera nonicterc Mouth:  op moist Abdomen:  Bowel sounds normal.  Abdomen is soft, nontender, nondistended.  No rebound or guarding.  No hepatosplenomegaly, masses or hernias.  No  abdominal bruits.  Extremities:  No clubbing, cyanosis, edema or deformities noted. Neurologic:  Alert and  oriented x4;  grossly normal neurologically. Skin:  Intact without significant lesions or rashes. Psych:  Alert and cooperative. Normal mood and affect.   Impression & Recommendations:  Problem # 1:  IBS (ICD-564.1)  Doing well at this time.  Orders: Est. Patient Level II [id number])  Problem # 2:  ANEMIA-IRON DEFICIENCY (ICD-280.9) IDA with h/o SB AVMS and prior cecal ulcer possibly related to ASA. Labs looked good in 12/11. Will see what most recent labs with Dr. Wende Neighbors showed. OV in six months for f/u.   Orders: Est. Patient Level II [id number])   Orders Added: 1)  Est. Patient Level II [id number]  Appended Document: IRON DEF/ANEMIA/LAW 6 MONTH F/U OPV IS IN THE COMPUTER

## 2010-06-11 LAB — CBC
HCT: 39 % (ref 39.0–52.0)
MCV: 89.8 fL (ref 78.0–100.0)
Platelets: 203 10*3/uL (ref 150–400)
RDW: 18.3 % — ABNORMAL HIGH (ref 11.5–15.5)

## 2010-06-11 LAB — FERRITIN: Ferritin: 24 ng/mL (ref 22–322)

## 2010-06-12 ENCOUNTER — Ambulatory Visit (HOSPITAL_COMMUNITY): Payer: Medicare Other

## 2010-06-19 ENCOUNTER — Ambulatory Visit (HOSPITAL_COMMUNITY): Payer: Medicare Other

## 2010-06-19 ENCOUNTER — Encounter (HOSPITAL_COMMUNITY): Payer: Medicare Other | Attending: Oncology

## 2010-06-19 DIAGNOSIS — D509 Iron deficiency anemia, unspecified: Secondary | ICD-10-CM

## 2010-06-24 LAB — CBC
MCHC: 33.2 g/dL (ref 30.0–36.0)
RBC: 4.37 MIL/uL (ref 4.22–5.81)
RDW: 15 % (ref 11.5–15.5)

## 2010-06-24 LAB — FERRITIN: Ferritin: 9 ng/mL — ABNORMAL LOW (ref 22–322)

## 2010-06-25 NOTE — Progress Notes (Signed)
Please call and request most recent labs from Dr. Wende Neighbors.

## 2010-06-25 NOTE — Progress Notes (Signed)
Requested from [place] at Dr. Juel Burrow office.

## 2010-06-26 ENCOUNTER — Other Ambulatory Visit: Payer: Self-pay

## 2010-06-26 ENCOUNTER — Encounter (HOSPITAL_COMMUNITY): Payer: Medicare Other | Attending: Oncology

## 2010-06-26 DIAGNOSIS — D509 Iron deficiency anemia, unspecified: Secondary | ICD-10-CM

## 2010-06-26 DIAGNOSIS — D649 Anemia, unspecified: Secondary | ICD-10-CM

## 2010-06-26 NOTE — Progress Notes (Signed)
Pt informed. Lab orders faxed to [facility].

## 2010-06-26 NOTE — Progress Notes (Signed)
Received labs from Dr. Juel Burrow office. Done in November 2011. LFTs normal. No CBC done then. Last CBC in December 2011 by Dr. Tressie Stalker.  Please check CBC now. Keep OV planned in 5-6 months.

## 2010-06-27 LAB — CBC
Platelets: 200 10*3/uL (ref 150–400)
RBC: 4.36 MIL/uL (ref 4.22–5.81)
WBC: 5.5 10*3/uL (ref 4.0–10.5)

## 2010-06-27 LAB — FERRITIN: Ferritin: 35 ng/mL (ref 22–322)

## 2010-06-29 LAB — COMPREHENSIVE METABOLIC PANEL
ALT: 8 U/L (ref 0–53)
AST: 18 U/L (ref 0–37)
Alkaline Phosphatase: 86 U/L (ref 39–117)
CO2: 28 mEq/L (ref 19–32)
Calcium: 9.2 mg/dL (ref 8.4–10.5)
Chloride: 104 mEq/L (ref 96–112)
GFR calc Af Amer: >60 mL/min (ref >60)
GFR calc non Af Amer: >60 mL/min (ref >60)
Potassium: 4 mEq/L (ref 3.5–5.1)
Sodium: 138 mEq/L (ref 135–145)
Total Bilirubin: 0.4 mg/dL (ref 0.3–1.2)

## 2010-06-29 LAB — IRON AND TIBC: Iron: 83 ug/dL (ref 42–135)

## 2010-06-29 LAB — FERRITIN: Ferritin: 6 ng/mL — ABNORMAL LOW (ref 22–322)

## 2010-06-29 LAB — TSH: TSH: 2.064 u[IU]/mL (ref 0.350–4.500)

## 2010-06-29 LAB — DIFFERENTIAL
Eosinophils Absolute: 0.1 10*3/uL (ref 0.0–0.7)
Eosinophils Relative: 1 % (ref 0–5)
Lymphs Abs: 1.2 10*3/uL (ref 0.7–4.0)

## 2010-06-29 LAB — HEMOCCULT GUIAC POC 1CARD (OFFICE)
Fecal Occult Bld: POSITIVE
Fecal Occult Bld: POSITIVE
Fecal Occult Bld: POSITIVE

## 2010-06-29 LAB — CBC
RBC: 4.26 MIL/uL (ref 4.22–5.81)
WBC: 7.4 10*3/uL (ref 4.0–10.5)

## 2010-06-30 LAB — CREATININE, SERUM
Albumin: 5.2
Alkaline Phosphatase: 96 U/L

## 2010-07-01 LAB — URINE MICROSCOPIC-ADD ON

## 2010-07-01 LAB — CBC
MCHC: 32.6 g/dL (ref 30.0–36.0)
MCV: 81.7 fL (ref 78.0–100.0)
Platelets: 312 10*3/uL (ref 150–400)

## 2010-07-01 LAB — DIFFERENTIAL
Eosinophils Relative: 0 % (ref 0–5)
Lymphocytes Relative: 14 % (ref 12–46)
Lymphs Abs: 2 10*3/uL (ref 0.7–4.0)
Monocytes Absolute: 1 10*3/uL (ref 0.1–1.0)
Neutro Abs: 11.3 10*3/uL — ABNORMAL HIGH (ref 1.7–7.7)

## 2010-07-01 LAB — COMPREHENSIVE METABOLIC PANEL
AST: 27 U/L (ref 0–37)
Albumin: 3.6 g/dL (ref 3.5–5.2)
BUN: 10 mg/dL (ref 6–23)
Calcium: 8.8 mg/dL (ref 8.4–10.5)
Creatinine, Ser: 0.92 mg/dL (ref 0.4–1.5)
GFR calc Af Amer: >60 mL/min (ref >60)
GFR calc non Af Amer: >60 mL/min (ref >60)

## 2010-07-01 LAB — URINALYSIS, ROUTINE W REFLEX MICROSCOPIC
Leukocytes, UA: NEGATIVE
Nitrite: NEGATIVE
Specific Gravity, Urine: 1.02 (ref 1.005–1.030)
pH: 6 (ref 5.0–8.0)

## 2010-07-02 LAB — APTT: aPTT: 39 seconds — ABNORMAL HIGH (ref 24–37)

## 2010-07-02 LAB — BRAIN NATRIURETIC PEPTIDE: Pro B Natriuretic peptide (BNP): 232 pg/mL — ABNORMAL HIGH (ref 0.0–100.0)

## 2010-07-02 LAB — CBC
HCT: 36.1 % — ABNORMAL LOW (ref 39.0–52.0)
Hemoglobin: 11.9 g/dL — ABNORMAL LOW (ref 13.0–17.0)
MCV: 81 fL (ref 78.0–100.0)
Platelets: 293 10*3/uL (ref 150–400)
WBC: 10.5 10*3/uL (ref 4.0–10.5)

## 2010-07-02 LAB — BASIC METABOLIC PANEL
BUN: 12 mg/dL (ref 6–23)
Chloride: 106 mEq/L (ref 96–112)
GFR calc non Af Amer: >60 mL/min (ref >60)
Potassium: 3.6 mEq/L (ref 3.5–5.1)
Sodium: 138 mEq/L (ref 135–145)

## 2010-07-02 LAB — POCT CARDIAC MARKERS
CKMB, poc: <1 ng/mL — ABNORMAL LOW (ref 1.0–8.0)
Myoglobin, poc: 65.7 ng/mL (ref 12–200)
Troponin i, poc: <0.05 ng/mL (ref 0.00–0.09)
Troponin i, poc: <0.05 ng/mL (ref 0.00–0.09)

## 2010-07-03 ENCOUNTER — Encounter: Payer: Self-pay | Admitting: Gastroenterology

## 2010-07-03 ENCOUNTER — Other Ambulatory Visit: Payer: Self-pay | Admitting: Gastroenterology

## 2010-07-04 LAB — CBC WITH DIFFERENTIAL/PLATELET
Basophils Absolute: 0 10*3/uL (ref 0.0–0.1)
Basophils Relative: 1 % (ref 0–1)
Eosinophils Relative: 1 % (ref 0–5)
HCT: 39.3 % (ref 39.0–52.0)
Hemoglobin: 12.8 g/dL — ABNORMAL LOW (ref 13.0–17.0)
MCH: 31.7 pg (ref 26.0–34.0)
MCHC: 32.6 g/dL (ref 30.0–36.0)
MCV: 97.3 fL (ref 78.0–100.0)
Monocytes Absolute: 0.4 10*3/uL (ref 0.1–1.0)
Monocytes Relative: 7 % (ref 3–12)
Neutro Abs: 4.5 10*3/uL (ref 1.7–7.7)
RDW: 14.6 % (ref 11.5–15.5)

## 2010-07-08 ENCOUNTER — Other Ambulatory Visit: Payer: Self-pay

## 2010-07-08 DIAGNOSIS — D649 Anemia, unspecified: Secondary | ICD-10-CM

## 2010-07-09 ENCOUNTER — Ambulatory Visit (INDEPENDENT_AMBULATORY_CARE_PROVIDER_SITE_OTHER): Payer: Medicare Other | Admitting: Cardiology

## 2010-07-09 ENCOUNTER — Encounter: Payer: Self-pay | Admitting: Cardiology

## 2010-07-09 VITALS — BP 120/68 | HR 65 | Ht 65.0 in | Wt 196.0 lb

## 2010-07-09 DIAGNOSIS — I10 Essential (primary) hypertension: Secondary | ICD-10-CM

## 2010-07-09 DIAGNOSIS — I6529 Occlusion and stenosis of unspecified carotid artery: Secondary | ICD-10-CM

## 2010-07-09 DIAGNOSIS — I2510 Atherosclerotic heart disease of native coronary artery without angina pectoris: Secondary | ICD-10-CM

## 2010-07-09 DIAGNOSIS — E782 Mixed hyperlipidemia: Secondary | ICD-10-CM

## 2010-07-09 NOTE — Assessment & Plan Note (Signed)
LDL tightly controlled based on most recent lab work.

## 2010-07-09 NOTE — Patient Instructions (Signed)
Your physician recommends that you continue on your current medications as directed. Please refer to the Current Medication list given to you today.  Your physician recommends that you schedule a follow-up appointment in: 6 months  

## 2010-07-09 NOTE — Progress Notes (Signed)
Clinical Summary Mr. Breed is a 70 y.o.male presenting for followup. He was seen in October 2011.  He reports regular episodes of chest pain, sometimes left-sided, sometimes right-sided. Description of the least some of these episodes seemed consistent with angina, others more suggestive of reflux. He is using sublingual nitroglycerin with good effect. He's had no prolonged episodes.  We reviewed his most recent cardiac testing, outlined below. Medical therapy looks fairly reasonable, and blood pressure is well controlled. Today we discussed continuing observation on medical therapy, versus proceeding to invasive cardiac testing to assess for any potential revascularization options. At this point he is most comfortable with observation. We discussed warning signs, and earlier followup if symptoms escalate.  LDL was 46 in November 2011.   Allergies  Allergen Reactions  . Penicillins     Current outpatient prescriptions:Albuterol Sulfate (PROAIR HFA IN), Inhale into the lungs.  , Disp: , Rfl: ;  ALPRAZolam (XANAX) 0.5 MG tablet, Take 0.5 mg by mouth at bedtime as needed.  , Disp: , Rfl: ;  carvedilol (COREG) 6.25 MG tablet, Take 6.25 mg by mouth 2 (two) times daily with a meal.  , Disp: , Rfl: ;  cetirizine (ZYRTEC) 10 MG tablet, Take 10 mg by mouth daily.  , Disp: , Rfl:  dutasteride (AVODART) 0.5 MG capsule, Take 0.5 mg by mouth daily. , Disp: , Rfl: ;  FLUoxetine (PROZAC) 20 MG capsule, Take 20 mg by mouth 2 (two) times daily. , Disp: , Rfl: ;  fluticasone (FLONASE) 50 MCG/ACT nasal spray, , Disp: , Rfl: ;  furosemide (LASIX) 40 MG tablet, Take 40 mg by mouth 1 dose over 46 hours.  , Disp: , Rfl: ;  glimepiride (AMARYL) 2 MG tablet, Take 2 mg by mouth daily before breakfast.  , Disp: , Rfl:  HYDROcodone-acetaminophen (VICODIN) 5-500 MG per tablet, Take 1 tablet by mouth every 6 (six) hours as needed.  , Disp: , Rfl: ;  isosorbide mononitrate (IMDUR) 60 MG 24 hr tablet, Take 60 mg by mouth daily.   , Disp: , Rfl: ;  NITROSTAT 0.4 MG SL tablet, , Disp: , Rfl: ;  omeprazole (PRILOSEC) 20 MG capsule, Take 20 mg by mouth 2 (two) times daily. 1 30 MIN BEFORE MEALS TWICE DAILY, Disp: , Rfl:  PLAVIX 75 MG tablet, Take 75 mg by mouth daily. , Disp: , Rfl: ;  rosuvastatin (CRESTOR) 20 MG tablet, Take 20 mg by mouth daily.  , Disp: , Rfl: ;  sitaGLIPtan-metformin (JANUMET) 50-1000 MG per tablet, Take 1 tablet by mouth 2 (two) times daily with a meal.  , Disp: , Rfl: ;  Tamsulosin HCl (FLOMAX) 0.4 MG CAPS, Take 0.4 mg by mouth at bedtime as needed.  , Disp: , Rfl: ;  VESICARE 5 MG tablet, Take 5 mg by mouth daily. , Disp: , Rfl:  DISCONTD: aspirin 81 MG tablet, Take 81 mg by mouth daily.  , Disp: , Rfl: ;  DISCONTD: losartan (COZAAR) 25 MG tablet, Take 25 mg by mouth daily.  , Disp: , Rfl: ;  DISCONTD: PARoxetine (PAXIL) 30 MG tablet, Take 30 mg by mouth every morning.  , Disp: , Rfl:   Past Medical History  Diagnosis Date  . TIA (transient ischemic attack)     2005  . GERD (gastroesophageal reflux disease)   . Coronary atherosclerosis of native coronary artery     Multivessel, BMS SVG TO RCA 2000, reportedly  "2" additional stents in interim  . Hyperlipidemia   .  Hypertension   . Bilateral carotid artery stenosis   . Depression   . Diabetes mellitus, type 2   . Restrictive lung disease   . Candida esophagitis   . Hemorrhoids   . Arteriovenous malformation small bowel     Capsule study 3/10  . GI bleeding     Cecal ulcers, arteriovenous malformations    Social History Mr. Frymire reports that he has quit smoking. His smoking use included Cigarettes. He has never used smokeless tobacco. Mr. Criger reports that he does not drink alcohol.  Review of Systems Otherwise reviewed and negative except as outlined.  Physical Examination Filed Vitals:   07/09/10 1404  BP: 120/68  Pulse: 65   Obese male no acute distress. HEENT: Conjunctiva and lids normal, oropharynx with poor  dentition. Neck: Supple, no elevated JVP or bruits, or thyromegaly. Lungs: Clear to auscultation, nonlabored. Cardiac: Regular rate and rhythm, indistinct PMI, soft basal systolic murmur, no S3 gallop. Abdomen: Protuberant, obese, bowel sounds present, no tenderness. Skin: Warm and dry, scattered tattoos noted. Extremities: Trace ankle edema, distal pulses one plus. Musculoskeletal: No kyphosis. Neuropsychiatric: Alert and oriented x3, affect appropriate.  ECG Normal sinus rhythm at 61 beats per minutes with R. Prime in lead V1, inferolateral T-wave inversions which are old.  Studies Echocardiogram 01/03/2010: - Left ventricle: The cavity size was normal. Wall thickness was     increased in a pattern of moderate LVH. The estimated ejection     fraction was 55%. Probable hypokinesis of the basal inferolateral     myocardium. The study is not technically sufficient to allow     evaluation of LV diastolic function.   - Aortic valve: Mildly calcified annulus. Mildly calcified leaflets.   - Mitral valve: Mild regurgitation.   - Left atrium: The atrium was moderately dilated.   - Tricuspid valve: Mild regurgitation.   - Pulmonary arteries: Systolic pressure was mildly increased.   - Pericardium, extracardiac: There was no pericardial effusion.  Myoview 01/03/2010: Abnormal Lexiscan Myoview as outlined.  There were no clearly   diagnostic ST-segment changes noted.  Perfusion data show evidence   of probable scar affecting the mid to basal inferolateral wall with   mild periinfarct ischemia, also potential element of scar in the   basal anterior wall.  LVEF is 53% with suggestion of inferior   hypokinesis to akinesis.  This would be consistent with underlying   CAD.  Carotid Dopplers 01/21/2010: Minimal bilateral carotid atherosclerosis.  No hemodynamically significant ICA stenosis on either side.  Problem List and Plan

## 2010-07-09 NOTE — Assessment & Plan Note (Signed)
Blood pressure is well controlled, no changes made today.

## 2010-07-09 NOTE — Progress Notes (Signed)
Pt has OV for 7/12 @ 3pm with SF

## 2010-07-09 NOTE — Assessment & Plan Note (Signed)
He is having angina, however prefers observation and medical therapy for the time being. Certainly if his symptoms escalate, we can discuss a cardiac catheterization to evaluate for revascularization options. He is already on fairly good medical therapy with good blood pressure heart rate control.

## 2010-07-09 NOTE — Assessment & Plan Note (Signed)
No hemodynamically significant stenoses noted by Doppler in November 2011.

## 2010-07-28 DIAGNOSIS — I10 Essential (primary) hypertension: Secondary | ICD-10-CM | POA: Insufficient documentation

## 2010-07-28 DIAGNOSIS — E782 Mixed hyperlipidemia: Secondary | ICD-10-CM | POA: Insufficient documentation

## 2010-07-28 NOTE — Progress Notes (Signed)
Addended by: Tye Savoy on: 07/28/2010 04:19 PM   Modules accepted: Orders

## 2010-08-05 NOTE — Op Note (Signed)
NAME:  Donald Gaines, Donald Gaines              ACCOUNT NO.:  [id number]   MEDICAL RECORD NO.:  [id number]          PATIENT TYPE:  AMB   LOCATION:  DAY                           FACILITY:  [facility]   PHYSICIAN:  Caro Hight, M.D.      DATE OF BIRTH:  04-24-1940   DATE OF PROCEDURE:  DATE OF DISCHARGE:                               OPERATIVE REPORT   REFERRING PHYSICIAN:  Delphina Cahill, MD   PROCEDURE:  1. Ileocolonoscopy with BICAP of ascending colon AVM.  2. Esophagogastroduodenoscopy with cold forceps biopsy of the gastric      and duodenal mucosa as well as ablation of a gastric AVM.   INDICATION FOR EXAM:  Donald Gaines is a 70 year old male who complains  of loose stool and has a transfusion-dependent anemia.  He has a history  of cecal ulcer due to aspirin use.  He can maintain chronically on  Plavix due to history of coronary artery disease with stent placement.  He continues to complain of abdominal pain and bloating.   FINDINGS:  1. Normal terminal ileum, approximately 10 cm visualized.  2. Single ascending colon AVM, ablated using BICAP (200/20).  3. Frequent sigmoid colon diverticula.  Otherwise, no polyps, masses,      or inflammatory changes seen.  4. Small internal hemorrhoids.  Otherwise, normal retroflexed view the      rectum.  5. Normal esophagus without evidence of Barrett's mass, erosion,      ulceration or stricture.  6. Patchy erythema of the gastric mucosa extending into the antrum and      sparing the antrum.  Biopsies obtained via cold forceps to evaluate      for H. pylori gastritis.  7. Single gastric AVM seen in the proximal stomach, which was ablated      using BICAP (200/25).  8. Normal duodenal bulb and second portion of the duodenum.  Moderate      bile staining.  Biopsies obtained via cold forceps to evaluate for      celiac sprue as an etiology for his anemia and his diarrhea.   DIAGNOSES:  1. Gastritis could explain his abdominal pain.  2. It is possible that  his anemia may be due to gastric and ascending      colon AVMs.  He may have small bowel AVMs that were not visualized      within the limitations of the scope.   RECOMMENDATIONS:  1. Continue Plavix.  2. If his anemia persists, then would add IV iron.  He states oral      iron makes his nipples hard and tender.  He also will need capsule      endoscopy if no evidence of celiac sprue is found on his biopsies      and his anemia persists.  3. Recheck his hemoglobin and hematocrit in 2 weeks.  4. He should continue his omeprazole.  5. Will await his biopsies and call him with the results.  6. Schedule CT scan of the abdomen and pelvis with IV contrast and      oral contrast to evaluate his abdominal  pain and bloating.   MEDICATIONS:  MAC provided by anesthesia.   PROCEDURE TECHNIQUE:  Physical exam was performed.  Informed consent was  obtained from the patient explaining the benefits, risks, and  alternatives of the procedure.  The patient was connected to monitor and  placed in left lateral position.  Continuous oxygen was provided by  nasal cannula.  IV medicine administered through an indwelling cannula.  After administration of sedation and rectal exam, the patient's rectum  was intubated.  The scope was advanced under direct visualization to the  distal terminal ileum.  The scope was removed slowly by carefully  examining the color, texture, anatomy, and integrity of mucosa on the  way out.   After the colonoscopy, the patient's esophagus was intubated with a  diagnostic gastroscope.  Endoscope was advanced under direct  visualization to the second portion of the duodenum.  The scope was  removed slowly by carefully examining the color, texture, anatomy, and  integrity of mucosa on the way out.  The patient was recovered in  endoscopy and discharged home in satisfactory condition. Discussed this  plan with Dr. Delphina Cahill.      Caro Hight, M.D.  Electronically  Signed     SM/MEDQ  D:  11/30/2007  T:  12/01/2007  Job:  [id number]   cc:   Delphina Cahill, M.D.  Fax: [phone]

## 2010-08-05 NOTE — Op Note (Signed)
NAME:  DRAXTON, PARROW NO.:  [id number]   MEDICAL RECORD NO.:  [id number]          PATIENT TYPE:  AMB   LOCATION:  DAY                           FACILITY:  [facility]   PHYSICIAN:  Caro Hight, M.D.      DATE OF BIRTH:  1941/01/07   DATE OF PROCEDURE:  10/30/2007  DATE OF DISCHARGE:                               OPERATIVE REPORT   REFERRING PHYSICIAN:  Delphina Cahill, MD   PROCEDURE:  Hydrogen breath test.   INDICATION FOR EXAM:  Mr. Woolard is a 69 year old male who complains  of swelling in his feet and bloating in his abdomen.  He did states that  he has one good bowel movement a day.  He also has nausea.  He states  Cialis really make his stomach swell.  He had a gastric emptying  studying to evaluate for delayed gastric emptying due to his history of  diabetes.  His gastric emptying study was normal.  He presents for  evaluation for small bowel bacterial overgrowth possibly secondary to  his diabetic enteropathy. His last CT scan was in February 2008 and it  only revealed gallstones, diverticulosis, and an enlarged prostate.   RESULTS:  At 30 minutes the breathalyzer read 17 parts per million.  A  trough occurred at 75 minutes at 7 parts per million.  The second peak  occurred at 120 minutes at 11 parts per million.   IMPRESSION:  Hydrogen breath test not consistent with small bowel  bacterial overgrowth.  Most likely etiology for Mr. Mitchell's symptoms  are dietary fiber, mild diabetic enteropathy, or irritable bowel  syndrome.   RECOMMENDATIONS:  He should continue to avoid dairy products.  He was  given a handout to clearly define what contained dairy and what does  not.  He also may want to decrease the fiber in his diet, which may be  contributing to bloating.      Caro Hight, M.D.  Electronically Signed     SM/MEDQ  D:  11/02/2007  T:  11/03/2007  Job:  [id number]   cc:   Delphina Cahill, M.D.  Fax: [phone]   Dr. Caffie Damme

## 2010-08-05 NOTE — Assessment & Plan Note (Signed)
NAME:  Donald Gaines, Donald Gaines               CHART#:  [id number]   DATE:  06/21/2007                       DOB:  12-03-1940   REFERRING PHYSICIAN:  Dr. Delphina Cahill.   PROBLEM LIST:  1. Gastroesophageal reflux disease.  2. Constipation.  3. Diabetes.  4. Hypertension.  5. Hyperlipidemia.  6. Cecal ulcer, likely secondary to NSAIDs.  7. Coronary artery disease/CABG with stent placement.  8. Colonoscopy and esophagogastroduodenoscopy in February of 2008 with      adenomatous polyp.  9. Small left adrenal adenoma seen on CT scan in 2009.  10.History of alcohol abuse, last alcohol use 5 years ago.  11.Tobacco abuse.  12.Carotid artery stenosis bilaterally.   SUBJECTIVE:  Mr. Petricca is a 70 year old who presents as a routine  patient visit. He complains of intermittent gas pain two times a week in  his left lower quadrant. Passing gas makes it better. He is still having  to strain to have bowel movements. He has a bowel movement every two  days. He is using MiraLax intermittently. His heartburn is controlled  with omeprazole twice a day.   MEDICATIONS:  1. Vytorin.  2. Cyclobenzaprine.  3. Coreg.  4. Lasix.  5. Aricept.  6. Amlodipine.  7. Cozaar.  8. Glimepiride.  9. Paxil.  10.Plavix.  11.Omeprazole 20 mg b.i.d.  12.Advair.  13.New medicine for enlarged prostate.  14.New blood sugar pill.   OBJECTIVE:  Weight 220 pounds (up 12 pounds since April 2008), height 5  foot 5-1/2 inches, temperature 97.6, blood pressure 130/62, pulse 88.  GENERAL:  He is in no apparent distress, alert and oriented x4. LUNGS:  Clear to auscultation bilaterally. CARDIOVASCULAR EXAM:  Regular rhythm.  No murmur.  ABDOMEN:  Bowel sounds are present, soft, nontender, nondistended.   ASSESSMENT:  Mr. Hajny is a 70 year old male with gastroesophageal  reflux disease that is well controlled. He has intermittent lower  abdominal pain which is likely secondary to irritable bowel and is  exacerbated by  constipation. Thank you for allowing me to see Mr.  Jowers in consultation. My recommendations follow.   RECOMMENDATIONS:  1. He was concerned about his gallstones which were seen on CT scan in      2009. He is not having any symptoms of biliary colic. I explained      those symptoms to him, and if he does those symptoms, he is asked      to notify me, and we will arrange for him to have his gallbladder      removed.  2. He is asked to use MiraLax Monday, Wednesday, Friday to treat his      constipation.  3. He should continue his omeprazole 20 mg b.i.d. and is given a      prescription.  4. He has a follow up appointment to see me in 6 months.       Caro Hight, M.D.  Electronically Signed     SM/MEDQ  D:  06/21/2007  T:  06/21/2007  Job:  [id number]   cc:   Delphina Cahill, M.D.

## 2010-08-05 NOTE — Assessment & Plan Note (Signed)
NAME:  Donald Gaines, Donald Gaines               CHART#:  [id number]   DATE:  10/13/2007                       DOB:  1941-02-24   PRIMARY CARDIOLOGIST:  Dr Eileen Stanford.   PRIMARY CARE PHYSICIAN:  Delphina Cahill, MD   PROBLEM LIST:  1. Cecal ulceration and anemia, felt secondary to aspirin-induced      disease.  2. Adenomatous polyps in April 2008.  3. GERD.  4. Constipation.  5. Diabetes.  6. Hypertension.  7. Hyperlipidemia.  8. Coronary artery disease status post bypass graft with stent      placement.  9. Left adrenal adenoma.  10.History of alcohol abuse, quit 5 years ago.  11.Tobacco abuse.  12.Carotid artery stenosis bilaterally.  13.Asymptomatic cholelithiasis.  14.Small renal and hepatic cyst.   SUBJECTIVE:  The patient is a 70 year old male who has presented for  return patient visit.  He was last seen in May 2009.  I had a discussion  with Dr. Quentin Cornwall, and he felt comfortable allowing the aspirin to be  discontinued due to his persistent anemia and no other source was  identified for his anemia except for the presumed NSAID-induced ulcer.  He states he cannot use a CPAP because it causes him to have nightmares.  He is complaining of his feet swelling up and bloating his abdomen.  He  does get pain in his lower abdomen when he is distended.  He has one  good bowel movement a day.  He is not using any MiraLax.  If he used it  his bowel movements are usually fairly large.  He has not changed his  diet.  He denies consumption of milk, cheese, or ice cream.  He does use  non-dairy creamer in his coffee.  He is nauseated 2-3 times a week.  He  denies any vomiting.  He does have a problem with warm water in my  mouth a lot.  He has heartburn and indigestion 3 times a week.  He  denies any problems swallowing.  He does eat broccoli and cauliflower,  and has tried to add fiber to his diet.  Salad really makes his stomach  swell up.  He denies any swelling in his neck, face, or arms.   Sometimes  he has swelling in his wrist.  He has been sleeping on 3 pillows for  over 20 years.  He reports waking up 3 times a week, short of breath in  the middle of the night.   MEDICATIONS:  1. Isosorbide.  2. Janumet.  3. Glimepiride.  4. Amlodipine.  5. Lasix.  6. Vytorin.  7. Crestor.  8. Aricept.  9. Plavix 75 mg daily.  10.Carvedilol.  11.Cyclobenzaprine.  12.Fluoxetine.  13.Exforge.  14.Flomax.  15.Omeprazole 20 mg b.i.d.  16.Avodart.  17.Remeron 15 mg q.h.s. for insomnia.   OBJECTIVE:   PHYSICAL EXAMINATION:  VITAL SIGNS:  Weight 219 pounds (up 6 pounds  since March 2008), height 5 feet 5 inches, temperature 98.1, blood  pressure 130/70, and pulse 80.  GENERAL:  He is in no apparent distress.  Alert and oriented x4. HEENT:  Atraumatic and normocephalic.  Pupils equal and reactive to light.  Mouth, no oral lesions.  Posterior pharynx is without erythema or  exudate. LUNGS:  Clear to auscultation bilaterally. CARDIOVASCULAR:  Regular rhythm.  No murmurs. ABDOMEN:  Bowel sounds  are present.  Soft,  nontender, and nondistended.  No rebound or guarding.  Obese.  EXTREMITIES:  Trace edema bilaterally. NEURO:  There is no focal  neurologic deficits.   ASSESSMENT:  The patient is a 70 year old male who complains of nausea  and postprandial bloating.  It is likely secondary to a diabetic  neuropathy, or functional gut disorder, or gastroparesis.  He may have  lactose intolerance.  Thank you for allowing me to see the patient in  consultation.  My recommendations follow.   RECOMMENDATIONS:  1. Will order gastric emptying study and if it does not reveal any      etiology for his bloating and his nausea, then we would consider      hydrogen breath test.  2. He should avoid dairy products.  I gave him a handout on lactose-      free diet.  3. He is asked to call Dr. Quentin Cornwall, if the swelling in his legs and      his wrists persist or he has      chest pain.  4. He  has a follow up appointment to see me in 6 weeks.       Caro Hight, M.D.  Electronically Signed     SM/MEDQ  D:  10/13/2007  T:  10/14/2007  Job:  [id number]   cc:   Delphina Cahill, M.D.  Dr Eileen Stanford

## 2010-08-05 NOTE — Assessment & Plan Note (Signed)
NAME:  Donald Gaines               CHART#:  [id number]   DATE:  08/17/2007                       DOB:  1940/11/23   CARDIOVASCULAR THORACIC SURGEON:  Dr. Eileen Stanford at [facility].   PRIMARY CARE PHYSICIAN:  Dr. Delphina Cahill.   CHIEF COMPLAINT:  GI bleed.   PROBLEM LIST:  1. History of cecal ulcers felt to be secondary to nonsteroidal anti-      inflammatory drugs.  2. Significant anemia requiring transfusion on Aug 08, 2007.      Hemoglobin 8.5, up to 10 post transfusion.  3. History of adenomatous colon polyps.  On last colonoscopy he was      found to have adenomatous colon polyp on July 12, 2006 by Dr.      Stann Mainland.  The cecum and ileocecal valve were normal at that time.  He      has sigmoid colon diverticulosis and small internal hemorrhoids.      Colonoscopy from May 05, 2006 showed cecal and non-cecal valve      ulcers of unclear etiology felt to be due to nonsteroidal anti-      inflammatory drugs versus ischemia.  Biopsy consistent with ulcer;      it had markedly atypical cells characterized by nuclear      enlargement, irregular with prominent nucleoli and a few mitotic      figures.  4. Gastroesophageal reflux disease.  5. Constipation.  6. Diabetes mellitus.  7. Hypertension.  8. Hyperlipidemia.  9. Coronary disease status post coronary artery bypass graft with      stent placement.  10.Small left stable adrenal adenoma.  11.History of alcohol abuse, quit 5 years ago.  12.Tobacco abuse.  13.Carotid artery stenosis bilaterally, followed by Dr. Quentin Cornwall.  14.Asymptomatic cholelithiasis.  15.Small renal and hepatic cysts.   SUBJECTIVE:  [name] is a 70 year old Caucasian male.  He had a  recent drop in his hemoglobin down to 8.5 grams.  He was transfused 2  units of packed RBCs on Aug 10, 2007.  Just prior to that he describes 1  episode of large-volume melena.  He is having some cramp-like bilateral  lower  quadrant pain.  He had some nausea but denies any daily abdominal  pain.  His aspirin was discontinued with his episode of melena.  He  continues to remain on Plavix 75 mg daily.  He denies any further NSAID  use including Goody's Powders or BCs.  He denies any heartburn,  indigestion, dysphagia, odynophagia, anorexia or early satiety.  He had  a bowel movement.  He generally has a bowel movement every 2 days.  Denies any constipation at this point.  He is on omeprazole 20 mg daily.   CURRENT MEDICATIONS:  See the list from Aug 17, 2007.   ALLERGIES:  PENICILLIN.   OBJECTIVE:  Laboratory studies from Aug 08, 2007, WBC 8.8, platelets  284.  Normal comprehensive metabolic panel except for a glucose of 116.  He had BMP of 506.  He had a hemoglobin A1c of 7.4.   PHYSICAL EXAM:  VITAL SIGNS:  Weight 219, pounds, height 65-1/2 inches,  temp 98.1, blood pressure 130/72, pulse 80.  GENERAL:  Donald Gaines is an obese Caucasian male who is alert,  pleasant, cooperative, in no acute distress.  HEENT:  Clear, nonicteric.  Oropharynx pink and moist without lesions.  NECK:  Supple without any  masses or thyromegaly.  CHEST:  Heart regular rate and rhythm.  Normal  S1 and S2.  Without murmurs, rubs, or gallops.  LUNGS:  Clear to auscultation bilaterally.  ABDOMEN:  Positive bowel  sounds x4.  No bruits auscultated.  Soft, nontender, nondistended.  He  does have diathesis rectus.  There was no rebound tenderness or  guarding.  No hepatosplenomegaly or masses.  Good body habitus.  EXTREMITIES:  Without clubbing or edema bilaterally.   ASSESSMENT:  Donald Gaines is a 70 year old Caucasian male with melena  and anemia requiring transfusion.  Melena has resolved at this point  since his aspirin has been held.  I have discussed this case with Dr.  Stann Mainland, who suspects that he is having recurrent colonic ulcers as the  culprit of his bleeding.   PLAN:  1. She would like him to hold his Plavix for 2 weeks  and then resume      and will discuss this further with Dr. Quentin Cornwall.  2. She also feels as though his aspirin should be held indefinitely.  3. Will check a CBC tomorrow.  4. I would like him to continue omeprazole 20 mg daily.  5. He is going to give [place] call back if he has recurrence of melena or      significant bleeding.  6. Office visit in 6 weeks with Dr. Caro Hight.   ADDENDUM:  Spoke with Dr. Quentin Cornwall. Okay to hold ASA.       Vickey Huger, N.P.  Electronically Signed     Caro Hight, M.D.  Electronically Signed    KJ/MEDQ  D:  08/17/2007  T:  08/17/2007  Job:  [id number]   cc:   Eileen Stanford, MD  Delphina Cahill, M.D.

## 2010-08-05 NOTE — Assessment & Plan Note (Signed)
NAME:  LIEM, MURAOKA               CHART#:  [id number]   DATE:  02/22/2008                       DOB:  [birth year]   REFERRING PHYSICIAN:  Delphina Cahill, M.D.   PROBLEM LIST:  1. Candida esophagitis treated with Diflucan.  2. Gallstones.  3. Fatty liver disease.  4. Coronary artery disease.  5. Ablation of gastric and ascending colon arteriovenous malformations      in September 2009.  6. Cecal ulcers caused by aspirin in February 2008 with a normal      followup colonoscopy in April 2008.  7. Internal hemorrhoids.  8. Adenomatous polyps in April 2008.  9. Colon lymphoma.  10.Gastroesophageal reflux disease.  11.Constipation with intermittent diarrhea.  12.Diabetes.  13.Hypertension.  14.Hyperlipidemia.  15.Left renal adenoma.  16.History of alcohol abuse, quit 5 years ago.  17.Tobacco abuse.  18.Bilateral carotid artery stenosis.  19.Renal and hepatic cyst.   SUBJECTIVE:  The patient is a 70 year old male who presents as a return  patient visit.  He was last seen in September 2009.  He presents  complaining of a chest cold today.  He is to be on iron pills for 3  weeks.  He denies any blood in his stool.  He denies any black tarry  stools.  He has 1 bowel movement a day. Sometimes it is normal, and  sometimes it is hard.  He denies any nausea or vomiting.  He denies any  heartburn.  He only had indigestion once since his last visit.  The  Thanksgiving went well.   MEDICATIONS:  1. Isosorbide.  2. Janumet.  3. Glimepiride.  4. Lasix.  5. Crestor.  6. Plavix daily.  7. Carvedilol.  8. Cyclobenzaprine.  9. Paroxetine.  10.Exforge.  11.Flomax.  12.Omeprazole 20 mg b.i.d.  13.Avodart.  14.Remeron 15 mg nightly.  15.Digestive enzyme daily.  16.Iron pills daily.  17.Advair twice a day.   OBJECTIVE:  VITAL SIGNS:  Weight 218 pounds (up 4 pounds since September  2009), height 5 feet 5 inches, temperature 97.7, blood pressure 128/82,  and pulse 76.  GENERAL:  He is in  no apparent distress.  Alert and oriented x4.HEENT:  Atraumatic, normocephalic.  Pupils equal and reactive to light, mouth  has 1 white plaque in the left upper hard palate posteriorly.  It was  nontender.  His posterior pharynx had no erythema or exudate.  Actually,  his posterior pharynx had mild erythema, but no exudate.NECK:  Full  range of motion.  No lymphadenopathy.LUNGS:  Have coarse breath sounds  bilaterally.CARDIOVASCULAR:  Regular rhythm.  No murmur.  Normal S1 and  S2.ABDOMEN:  Bowel sounds are present, soft, nontender, nondistended,  obese.EXTREMITIES:  No cyanosis or edema.  NEUROLOGIC:  He has no focal neurologic deficits.   ASSESSMENT:  The patient is a 70 year old male who has had an extensive  gastrointestinal workup for anemia.  His hemoglobin was 8.8 in August  2009 and increased to 10.3 in October 2009.  His aspirin has been  discontinued and he continues on Plavix.  Currently, the oral iron is  not making his nipples hard or tender.  Thank you for allowing me to see  the patient in consultation.  My recommendations follow.  He also  appears to have thrush.   RECOMMENDATIONS:  1. He is to increase his iron to twice a  day.  If he is unable to      tolerate oral iron, then he will be referred to Dr. Tressie Stalker for      IV iron.  We could also consider completing his evaluation with a      capsule endoscopy.  The last time his ferritin was measured was in      September 2009 prior to his ablation of his AVMs.  2. Follow up visit in March in 2010 and repeat this hemoglobin,      hematocrit, and ferritin at that time.  3. Will add nystatin oral suspension 1 teaspoon 4 times a day swish      and swallow for 10 days for his thrush.  He is to call me in 2      weeks if his symptoms is the white patch on his hard      palate does not resolve.  If it does not resolve, he will need an      ear, nose, and throat referral.       Caro Hight, M.D.  Electronically  Signed     SM/MEDQ  D:  02/22/2008  T:  02/22/2008  Job:  [id number]   cc:   Delphina Cahill, M.D.  Milana Kidney, MD

## 2010-08-05 NOTE — Procedures (Signed)
CAROTID DUPLEX EXAM   INDICATION:  Followup evaluation of known carotid artery disease.   HISTORY:  Diabetes:  No  Cardiac:  Coronary artery bypass graft in 1994, 3 coronary artery stents  Hypertension:  Yes  Smoking:  Pack per day  Previous Surgery:  No  CV History:  Every 2-3 months the patient experiences 10 minute episode  of left arm and leg numbness  Amaurosis Fugax No, Paresthesias Yes, Hemiparesis No                                       RIGHT             LEFT  Brachial systolic pressure:         144               156  Brachial Doppler waveforms:         Triphasic         Triphasic  Vertebral direction of flow:        Antegrade         Antegrade  DUPLEX VELOCITIES (cm/sec)  CCA peak systolic                   98                [id number]  ECA peak systolic                   144               0000000  ICA peak systolic                   108               [id number]  ICA end diastolic                   34                30  PLAQUE MORPHOLOGY:                  Mixed             Mixed  PLAQUE AMOUNT:                      Mild to moderate  Mild  PLAQUE LOCATION:                    Proximal ICA, ECA Proximal ICA   IMPRESSION:  20-39% ICA stenosis bilaterally   ___________________________________________  P. Drucie Opitz, M.D.   MC/MEDQ  D:  01/20/2007  T:  01/21/2007  Job:  [id number]
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
## 2010-08-05 NOTE — H&P (Signed)
NAME:  JOREY, JAHODA NO.:  [id number]   MEDICAL RECORD NO.:  [id number]         PATIENT TYPE:  PAMB   LOCATION:                                FACILITY:  [facility]   PHYSICIAN:  Caro Hight, M.D.      DATE OF BIRTH:  1941/02/27   DATE OF ADMISSION:  11/23/2007  DATE OF DISCHARGE:                              HISTORY & PHYSICAL   PRIMARY CARE PHYSICIAN:  Delphina Cahill, M.D.   PRIMARY CARDIOLOGIST:  Eileen Stanford, M.D.   PROBLEM LIST:  1. History of large cecal ulcers causing anemia in February 2008 with      follow-up colonoscopy in April 2008, showing no evidence of cecal      ulcers.  2. Internal hemorrhoids.  3. Colon lipoma.  4. Adenomatous polyps in April 2008.  5. Gastroesophageal reflux disease.  6. Constipation.  7. Diabetes.  8. Hypertension.  9. Hyperlipidemia.  10.Coronary artery disease, status post bypass graft with stent      placement requiring Plavix.  11.Left renal adenoma.  12.History of alcohol abuse, quit 5 years ago.  13.Tobacco abuse.  14.Bilateral carotid artery stenosis.  15.Gallstones.  16.Renal and hepatic cysts.   SUBJECTIVE:  Mr. Trayer is a 70 year old male who presents as a return  patient visit.  He was last seen in February 2009, when he was  complaining of nausea and postprandial bloating.  It was thought that he  either has a functional gut disorder, diabetic enteropathy or  gastroparesis or lactose intolerance.  He had a gastric emptying study  and it was normal.  His hydrogen breath test was normal.  He complained  of feeling tired and weak and Dr. Nevada Crane ordered a CBC.  On November 18, 2007, his hemoglobin was 8.8 with an MCV of 76.3 and a platelet count of  280.  His BUN was 11, his creatinine was 0.82.  He denies seeing any  rectal bleeding.  He has persistent diarrhea.  He has nausea but no  vomiting.  Rarely has heartburn.  He never has indigestion.  His bowels  have been runny since July 2009.  He did try the  Digestive Advantage  but does not feel like it is making any difference.  He does not consume  any dairy products.  He does not consume any processed foods that might  inadvertently contain dairy.  He has city water, has never traveled and  has not taken any antibiotics.  He is not having any fever.  He does not  like taking oral iron because it makes his nipples hard and when the  shirt rubs up against it, it rubs the skin off.   MEDICATIONS:  1. Isosorbide.  2. Janumet 50/500.  3. Glimepiride 2 mg b.i.d.  4. Furosemide 40 mg daily.  5. Crestor.  6. Plavix 75 mg daily.  7. Carvedilol 6.25 mg b.i.d.  8. Cyclobenzaprine 10 mg t.i.d.  9. Paroxetine 30 mg daily.  10.Exforge 10/160 daily.  11.Flomax 0.4 mg nightly.  12.Omeprazole 20 mg b.i.d.  13.Avodart 0.5 mg daily.  14.Remeron 15 mg nightly.  15.Digestive  Advantage daily.   PHYSICAL EXAMINATION:  VITAL SIGNS:  Weight 214 pounds (down 5 pounds  since July 2009), height 5 feet 5 inches, BMI 35.6 (severely obese),  temperature 97.8, blood pressure 112/78, pulse 72. GENERAL APPEARANCE:  He is in no apparent distress, alert and oriented x4.NECK:  Full range  of motion with no lymphadenopathy.  LUNGS:  Clear to auscultation  bilaterally. CARDIOVASCULAR:  Regular rhythm, no murmur.  Normal S1 and  S2. ABDOMEN:  Bowel sounds present, soft, nontender, nondistended, no  rebound or guarding, obese. NEUROLOGIC:  He has no focal neurologic  deficits.   ASSESSMENT:  Mr. Finkle is a 70 year old male who presents with a  microcytic anemia and a history of a cecal ulcer which is felt to be  nonsteroidal anti-inflammatory drug-induced.  His anemia persists in  spite of his discontinuing the use of aspirin.  The differential  diagnosis for his microcytic anemia includes early iron-deficiency  anemia secondary to arteriovenous malformations, return of cecal ulcer,  inflammatory bowel disease, and a low likelihood of celiac sprue. Thank  you  for allowing me to see Mr. Dollens in consultation.  My  recommendations follow.   RECOMMENDATIONS:  1. He is given a prescription for Bentyl and asked to take 10 mg 30      minutes before breakfast, lunch and supper.  2. He will be scheduled for an EGD and colonoscopy with propofol next      Wednesday.  3. He needs propofol because he has required high doses of Demerol,      Versed and Phenergan for his last 2 procedures, likely due to his      use of chronic pain medications and anxiolytics.  He will have      TriLyte bowel prep.  4. He will hold his Janumet the morning of his procedure and his      glimepiride the evening and the morning of his procedure.  He      should continue his Plavix.  5. Will check TIBC, ferritin, basic metabolic panel, CBC with      differential, fecal lactoferrin and Giardia antigen.  I think he      has a low likelihood of an infectious etiology for his diarrhea but      it remains in the differential diagnosis.  6. He has a follow-up appointment to see me in 2-3 months.      Caro Hight, M.D.  Electronically Signed     SM/MEDQ  D:  11/23/2007  T:  11/23/2007  Job:  [id number]   cc:   Delphina Cahill, M.D.

## 2010-08-08 NOTE — Consult Note (Signed)
NAME:  Donald Gaines, Donald Gaines              ACCOUNT NO.:  [id number]   MEDICAL RECORD NO.:  [id number]          PATIENT TYPE:  INP   LOCATION:  A204                          FACILITY:  [facility]   PHYSICIAN:  Hildred Laser, M.D.    DATE OF BIRTH:  31-Jul-1940   DATE OF CONSULTATION:  05/04/2006  DATE OF DISCHARGE:                                 CONSULTATION   REQUESTING PHYSICIAN:  [facility]   REASON FOR CONSULTATION:  Melena, GI bleed, anemia.   PRIMARY CARE PHYSICIAN:  Duke Salvia, M.D., [facility].   HISTORY OF PRESENT ILLNESS:  The patient is 70 year old Caucasian  gentleman who presented to [facility] emergency department with  atypical chest pain and shortness of breath.  He had actually gone to  [facility] yesterday but was told that he would have a  long wait, and he decided to come here.  He states over the last few  weeks he has had some intermittent crampy lungs.  He has had some  shortness of breath and cough.  Yesterday, however, the pain was very  severe, and he thought he was going to die.  He also found out about  three weeks ago that he was anemic.  He was being scheduled for an  outpatient colonoscopy, but this has not been done yet.  He was started  on one iron tablet daily about a week ago, and he took about four doses,  the last dose about three days ago.  He states that his stool had turned  very black recently but had been very dark off and on for a couple of  weeks.  He has not seen any fresh blood per rectum.  He generally has  two to three stools a week.  He complains of nausea but no vomiting.  About three weeks ago, he started having some dysphagia to solid foods.  He denies any heartburn as long as he is on his omeprazole.  He  complains of a five-year history of intermittent crampy lower abdominal  pain which he has had ever since his inguinal hernia repair.  He says he  has not had any workup of this.   He has had cardiac enzymes x1  which were negative.  There are no old  EKGs to compare his current EKGs with.  He is being ruled out for MI.   HOME MEDICATIONS:  1. Aricept 10 mg daily.  2. Aspirin 81 mg daily.  3. Coreg 3.125 mg b.i.d.  4. Cozaar 50 mg daily.  5. Flexeril 01 mg t.i.d.  6. Glimepiride 2 mg daily.  7. Isosorbide mononitrate 120 mg daily.  8. Lasix 40 mg daily.  9. Mirtazapine 15 mg q.h.s.  10.Paroxetine 30 mg daily.  11.Plavix 75 mg daily.  12.Norvasc 10 mg daily.  13.Vytorin 10/20 mg daily.  14.Omeprazole 40 mg daily.  15.Vicodin p.r.n. pain.   He denies any BC's, Goody Powders, Advil, Aleve, or other NSAIDs.   ALLERGIES:  PENICILLIN.   PAST MEDICAL HISTORY:  1. Diabetes mellitus.  2. Hypertension.  3. Hypercholesterolemia.  4. Mild dementia.  5. GERD.  6. Coronary artery disease, status post coronary artery bypass graft.      He subsequently had some stents placed.  7. Right inguinal hernia repair in 2002.  8. Last EGD and colonoscopy in 2002.  He denies having a history of      polyps or ulcers.   FAMILY HISTORY:  Brother succumbed to lung cancer.  Another brother had  heart disease.  No family history of colorectal cancer.   SOCIAL HISTORY:  He lives in [place] with his wife.  He is on disability.  He has one child.  He smokes one pack of cigarettes daily.  He has over  a 50-pack-year history of tobacco use.  Previously, heavier alcohol  consumer but has had no alcohol in five years.   REVIEW OF SYSTEMS:  GI:  See HPI.  CONSTITUTIONAL:  He denies any  significant weight loss.  CARDIOPULMONARY:  He presented with atypical  chest pain and initially started on the left and migrated to the right.  He describes it as crampy lung pain.  Worse with breathing.  He has  had a cough but no production of sputum.  He states that his chest pain  is much better this morning.   PHYSICAL EXAMINATION:  VITAL SIGNS:  Temperature 97.5, pulse 66,  respirations 20, blood pressure 114/67, weight 95.9  kg, height 65  inches.  GENERAL:  Pleasant, elderly, obese Caucasian male in no acute distress.  SKIN:  Warm and dry.  No jaundice.  HEENT:  Sclerae nonicteric.  Oropharyngeal mucosa moist and pink.  CHEST:  Lungs are clear to auscultation.  CARDIAC:  Regular rate and rhythm.  No murmurs, rubs, or gallops.  ABDOMEN:  Positive bowel sounds.  Obese but symmetrical.  Soft.  Moderate tenderness throughout the entire lower abdomen to deep  palpation.  No rebound tenderness or guarding.  No organomegaly or  masses.  EXTREMITIES:  No edema.   LABORATORY DATA:  From May 03, 2006, white count 9400, hemoglobin  8.3, hematocrit 26.6, MCV 68, platelets 397,000.  BUN 11, creatinine  0.99, potassium 3.9.  INR 1.3, D-dimer 0.4.  Today, his LFTs are normal.  Total CK 265, CK-MB 2.7, troponin 0.03, BNP 140.   Chest x-ray revealed cardiomegaly with mild vascular congestion and mild  bibasilar atelectasis.   IMPRESSION:  The patient is a 70 year old gentleman with:  1. Microcytic anemia and heme-positive stools.  He has had some black      or darker stools since starting his iron.  I doubt that we are      dealing with true melena.  His last iron dose was two to three days      ago.  Currently, his stool is brown in the emergency room.  Suspect      chronic occult gastrointestinal bleed which needs to be further      evaluated with colonoscopy.  In addition, he is having some      dysphagia and chronic gastroesophageal reflux disease.  I will go      ahead and proceed with esophagogastroduodenoscopy as well.  I      discussed the risks, alternatives, and benefits with the patient      with regards to the risks of bleeding, infection, perforation, and      he is agreeable to proceed.  2. Chronic abdominal pain of greater than five years' duration since      his inguinal hernia repair.  Will consider outpatient workup if  not     previously done, i.e., CT of the abdomen and pelvis.  3. Atypical  chest pain being worked up by [facility].  He is in      the process of being ruled out for a myocardial infarction.   RECOMMENDATIONS:  1. EGD and colonoscopy tomorrow.  2. Followup pending anemia panel.  Will check H&H one hour post-      transfusion and again in the morning.  3. Continue PPI therapy as planned.      Neil Crouch, P.A.      Hildred Laser, M.D.  Electronically Signed    LL/MEDQ  D:  05/04/2006  T:  05/04/2006  Job:  [id number]   cc:   [facility]   Duke Salvia, M.D.  Fax: [phone]

## 2010-08-08 NOTE — Consult Note (Signed)
NAME:  Donald Gaines, Donald Gaines              ACCOUNT NO.:  [id number]   MEDICAL RECORD NO.:  [id number]          PATIENT TYPE:  INP   LOCATION:  A204                          FACILITY:  [facility]   PHYSICIAN:  Cristopher Estimable. Lattie Haw, MD, FACCDATE OF BIRTH:  [birth year]   DATE OF CONSULTATION:  05/04/2006  DATE OF DISCHARGE:                                 CONSULTATION   REFERRING PHYSICIAN:  Dr. Maryland Pink.   PRIMARY CARDIOLOGIST:  Dr. Wynonia Lawman and Dr. Quentin Cornwall at [facility].   HISTORY OF PRESENT ILLNESS:  A 70 year old gentleman admitted to the  hospital with chest pain in the setting of anemia and apparent GI  bleeding.  The patient was interviewed and examined.  His case was  discussed with his family.  Prior records from [facility] and Dr.  Ruffin Frederick office were obtained.  Mr. Langbehn cardiac history dates  to 70 when he underwent CABG surgery.  He subsequently did well until  2000 when repeat catheterization revealed stenosis in the saphenous vein  graft to the right coronary artery.  A stent was placed with good  results.  He subsequently underwent stress testing intermittently with  evidence for inferolateral ischemia, but repeat coronary angiography was  not undertaken.  Risk factors were modified appropriately except that  the patient has never refrained from cigarette smoking.  Hypertension  and hyperlipidemia have apparently been well treated as has his  diabetes.   The patient has additional vascular disease.  He suffered an apparent  TIA in 2005 and was found to have left external carotid artery disease  at that time.  He has peripheral vascular disease and has previously  undergone angiography.  Abdominal and renal vessels looked fairly good.   Additional medical problems include restrictive lung disease, sleep  apnea, a history of excessive alcohol use, a history of paralysis of the  right hemidiaphragm, presumably related to bypass surgery, erectile  dysfunction,  depression, chronic headaches.  The patient describes a  right inguinal herniorrhaphy that resulted in some sort of complication,  which is not clear based upon his description.  The surgery occurred in  June of 2001.   The patient presents to our emergency department complaining  predominantly of leg cramps.  These did not clearly represent  claudication.  With additional discussion, he also reported melena,  chest discomfort and palpitations.  He describes his chest discomfort as  left-sided and mild to at worst moderate.  There was some radiation  towards the right.  The quality is crampy discomfort that has a pressure  component.  There was some intermittency to his symptoms.  There was no  associated dyspnea nor diaphoresis.  There was a pleuritic component.   SOCIAL HISTORY:  Remote history of excessive alcohol use; greater than  100 pack-year history of cigarette smoking, which continues at  approximately one pack per day.  No use of illicit drugs.  Disabled.   FAMILY HISTORY:  Two brothers suffered fatal myocardial infarctions; his  mother died with hypertensive crisis.   REVIEW OF SYSTEMS:  Is notable for vaccinations being up to date.  The  patient  reports some urinary hesitancy.  He has had arthritic discomfort  in the knees and hips.  All other systems reviewed and are negative.   EXAM:  Pleasant gentleman in no acute distress.  The blood pressure is  120/75, heart rate 75 and regular, respirations 16, afebrile.  HEENT:  Anicteric sclerae; normal lids and conjunctivae.  NECK:  Left carotid bruits; normal carotid upstrokes.  ENDOCRINE:  No thyromegaly.  SKIN:  No significant lesions.  HEMATOPOIETIC:  No adenopathy.  LUNGS:  Decreased breath sounds at the bases; some increase in the  expiratory phase.  Few bibasilar rales.  CARDIAC:  Normal first and second heart sounds; fourth heart sound and  modest basilar systolic ejection murmur noted; normal PMI.  ABDOMEN:  Soft  and nontender; no bruits; no organomegaly.  No masses.  EXTREMITIES:  Decreased but present.  Distal pulses; no edema.  NEUROMUSCULAR:  Symmetric strength and tone; normal cranial nerves.   EKG:  Normal sinus rhythm; deep anterior T-wave inversions with more  shallow lateral inverted T-waves.  Chest x-ray:  Cardiomegaly; basilar  atelectasis; vascular redistribution.  Cardiac markers are negative.  Chemistry profile is normal.  Initial CBC showed an MCV of 68,  hemoglobin of 8.3 and hematocrit of 26.6.   IMPRESSION:  Mr. Nutting has known widespread atherosclerotic disease.  This reflects multiple cardiovascular risk factors and ongoing tobacco  abuse.  His chest discomfort is not classic for angina, but he certainly  could have experienced myocardial ischemia related to anemia and  underlying coronary disease.  The rule out myocardial infarction  protocol will be completed.  An echocardiogram will be obtained to  verify that he continues to have normal left ventricular systolic  function, especially in light of his EKG abnormalities.  If symptoms  abate with treatment of his anemia, no urgent further testing will be  required.  Since he has had positive stress test in the past, this might  not be of value.  Consideration will need to be given to repeat coronary  angiography.   Mr. Sobers has a carotid bruit.  Vascular studies have not been  obtained for some time.  Carotid ultrasound will be ordered.   He appears to have received good medical therapy for hypertension,  diabetes and hyperlipidemia.  His treatment will be reassessed.   He has been told that he has no significant lung disease other than  sleep apnea, but this appears unlikely.  We will assess him  symptomatically and monitor oxygen saturation.   I greatly appreciate the request for consultation and would be happy to  follow this gentleman with you in hospital.  If he chooses to move his care from [facility], I will  be happy to see him on an outpatient basis as  well.      Cristopher Estimable. Lattie Haw, MD, [facility]  Electronically Signed     RMR/MEDQ  D:  05/05/2006  T:  05/05/2006  Job:  [id number]

## 2010-08-08 NOTE — Op Note (Signed)
NAME:  Donald Gaines, Donald Gaines              ACCOUNT NO.:  [id number]   MEDICAL RECORD NO.:  [id number]          PATIENT TYPE:  AMB   LOCATION:  DAY                           FACILITY:  [facility]   PHYSICIAN:  Caro Hight, M.D.      DATE OF BIRTH:  [birth year]   DATE OF PROCEDURE:  07/12/2006  DATE OF DISCHARGE:                               OPERATIVE REPORT   REFERRING PHYSICIAN:  Delphina Cahill, M.D.   PROCEDURE:  Colonoscopy with cold forceps polypectomy and snare cautery  polypectomy.   INDICATION FOR EXAM:  Donald Gaines is a 70 year old male who was  initially seen for colonoscopy in March 2008 when he presented with  black, tarry stool and anemia.  He had been maintained chronically on  aspirin for his coronary artery disease.  The biopsy of a large ulcer at  the ileocecal valve and the cecum showed some markedly atypical cells  characterized by nuclear enlargement and irregularity with prominent  nucleoli and few mitotic figures.  They may have represented reactive  inflammatory atypia related to the presence of ulceration and fibrosis,  but the etiology of the findings was unclear.  It was felt that these  ulcers may be related to ischemia or anti-inflammatory drugs.The  colonoscopy is done to reevaluate this area and to ensure that healing  has occurred and these ulcers are not malignant.   FINDINGS:  1. The cecum and the ileocecal valve are normal in appearance.  2. Three mm cecal polyp removed via cold forceps.  3. Six mm descending colon polyp and 6 mm sigmoid colon polyp removed      via snare cautery.  Otherwise, no masses, inflammatory changes seen      throughout the colon.  4. Sigmoid colon diverticulosis.  5. Normal retroflexed view of the rectum.  6. Donald Gaines has a tortuous colon which made intubating the cecum      rather challenging.  7. Small internal hemorrhoids.   RECOMMENDATIONS:  1. High fiber diet:  Donald Gaines is given information on      diverticulosis, polyps,  high fiber diet, and hemorrhoids.  2. No aspirin, anti-inflammatory drugs, or anticoagulation x7 days.  3. He may restart his Plavix on July 20, 2006.  4. We will call Donald Gaines with the results of his biopsies.  5. Screening colonoscopy in 5 years.  He should have a polyethylene      Glycol bowel prep.   MEDICATIONS:  1. Demerol 125 mg IV.  2. Versed 8 mg IV.  3. Phenergan 12.5 mg IV.   PROCEDURE TECHNIQUE:  Physical exam was performed, and informed consent  was obtained from the patient after explaining the benefits, risks, and  alternatives to the procedure.  The patient was connected to the monitor  and placed in the left lateral position.  Continuous oxygen was provided  by nasal cannula and IV medicine administered through an indwelling  cannula.  After administration of sedation and rectal exam, the  patient's rectum was intubated, and the scope was advanced under direct  visualization to the cecum.  The scope was subsequently  removed slowly  by carefully examining the color, texture, anatomy, and integrity of the  mucosa on the way out.  The patient was recovered in endoscopy and  discharged home in satisfactory condition.      Caro Hight, M.D.  Electronically Signed     SM/MEDQ  D:  07/12/2006  T:  07/12/2006  Job:  [id number]   cc:   Delphina Cahill, M.D.  Fax: [phone]

## 2010-08-08 NOTE — H&P (Signed)
NAME:  Donald Gaines, Donald Gaines NO.:  [id number]   MEDICAL RECORD NO.:  [id number]          PATIENT TYPE:  INP   LOCATION:  A204                          FACILITY:  [facility]   PHYSICIAN:  Bonnielee Haff, MD     DATE OF BIRTH:  06/23/1940   DATE OF ADMISSION:  05/03/2006  DATE OF DISCHARGE:  LH                              HISTORY & PHYSICAL   PRIMARY CARE PHYSICIAN:  Dr. Jeanie Cooks at [place], [place].   ADMISSION DIAGNOSES:  1. Atypical chest pain, unclear etiology.  2. Melena, rule out gastrointestinal bleeding.  3. Microcytic anemia, possibly from gastrointestinal bleeding.  4. Type 2 diabetes.  5. Hypertension.  6. Coronary artery disease.   CHIEF COMPLAINT:  Chest pain, weakness for the past two days.   HISTORY OF PRESENT ILLNESS:  The patient is a 70 year old Caucasian male  with a history of diabetes, hypertension, coronary artery disease,  dementia who was doing well until about two to three days ago when he  started noticing left-sided chest pain which apparently migrated to the  right side.  He describes them as lungs cramping.  He has had this  cramping-type sensation on and off.  At worst it was about 9/10 in  intensity, associated with shortness of breath but no palpitations, no  diaphoresis, no nausea and no vomiting.  No history of fever, slight  chills today.  His wife has been sick for the past two days.  The pain  is worse with movement and with breathing.  He has also noted a cough  with no expectoration.   He also mentioned that about two weeks ago he had a blood test done in  his doctor's office and was told that he has a severe anemia and he was  scheduled for a colonoscopy in the near future.  According to the  patient, he was supposed to see this gastroenterologist tomorrow.   The patient also complains of lower abdominal pain and leg cramping in  both his calves which has been ongoing on and off since 2002, ever since  he had a hernia  surgery.   MEDICATIONS AT HOME:  1. Aricept 10 mg once a day.  2. Aspirin 81 mg once a day.  3. Coreg 3.125 mg b.i.d.  4. Cozaar 50 mg daily.  5. Cyclobenzaprine 10 mg t.i.d.  6. Glimepiride 2 mg once a day.  7. Isosorbide mononitrate 120 mg once a day.  8. Lasix 40 mg once a day.  9. Mirtazapine 15 mg q.h.s.  10.Paroxetine 30 mg once a day.  11.Plavix 75 mg once a day.  12.Norvasc 10 mg once a day.  13.Vytorin 10/20 once a day.  14.Omeprazole 40 mg once a day.  15.Hydrocodone as needed for pain.   ALLERGIES:  PENICILLIN unknown reaction.   PAST MEDICAL HISTORY:  1. Coronary artery disease, status post coronary artery bypass      grafting done in 1994.  He has had stent placement as well.  2. He also has a history of diabetes type 2.  3. Dyslipidemia.  4. Hypertension.  5. Dementia.  6. GERD.   PAST SURGICAL HISTORY:  Includes right-sided possible inguinal hernia  repair in 2002.   SOCIAL HISTORY:  Lives in [place] with his wife.  He is on disability.  Smokes a pack of cigarettes per day.  Has a 50 pack-year history of  smoking.  No alcohol consumption in five years.  No illicit drug use.  Independent with his daily activities.   FAMILY HISTORY:  Includes lung cancer, some other kind of unknown  cancer, hypertension, diabetes and cardiac disease.   REVIEW OF SYSTEMS:  HEENT:  Unremarkable.  CARDIOVASCULAR:  See HPI.  RESPIRATORY:  See HPI.  GI:  Positive for black stools over the past two  weeks.  No history suggestive of hematochezia.  GU:  Unremarkable.  RENAL:  Unremarkable.  ID: Unremarkable.  MUSCULOSKELETAL:  Unremarkable, maybe the leg cramping but I am not sure at this point if  they are related to musculoskeletal issues or not.  NEUROLOGICAL:  Unremarkable.   PHYSICAL EXAMINATION:  VITAL SIGNS:  Temperature 97.8, blood pressure  122/53, heart rate 77, respiratory rate 18, saturation 98%.  I was told  it dropped to about 80% on room air and then when he was put  back on  oxygen it went up to 90 something.  GENERAL:  An obese white male in no distress.  HEENT:  There is mild pallor.  No icterus.  Mucus membrane is moist.  No  oral lesions are noted.  LUNGS:  Clear to auscultation bilaterally.  CARDIOVASCULAR:  S1-S2, regular.  Systolic murmur appreciated at the  aortic area.  No carotid bruits appreciated.  No S3-S4.  ABDOMEN:  Obese, nontender, nondistended.  Bowel sounds present.  There  is slight discomfort in the right lower quadrant but no rebound,  rigidity or guarding is present.  RECTAL:  Done by the ED physician and was positive for occult blood.  There was brown stool.  EXTREMITIES:  Without edema.  Peripheral pulses are poor by palpable.  NEUROLOGICAL:  The patient does not have any focal neurological  deficits.  He is alert and oriented x3.   LABORATORY DATA:  Normal white count, hemoglobin is 8.3 with MCV of 68,  platelet count of 297.  RDW is 19.6, d-dimer is 0.4, BMET reveals a  glucose of 125, otherwise unremarkable.  Occult blood was positive in  the stool.   He also had an EKG which showed a sinus rhythm with normal axis and does  appear to be within normal range.  I do not appreciate any Q-waves.  There are a lot of T-wave inversions in noted I, aVL, V5, V6 and also in  V2-V4, also in lead II.  Mild ST depression in V3, V4 and V5 also noted.  I do not have any old EKGs to compare at this time.  He also had a chest  x-ray which showed cardiomegaly with mild vascular congestion, mild  basilar subsegmental atelectasis.   ASSESSMENT:  1. This is a 70 year old Caucasian male with medical problems stated      above who presents with three main issues:  One of them is chest      pain, which is very atypical for coronary artery disease.  He does      have nonspecific EKG changes.  I do not know how much of this is      new and how much is old since we do not have an old EKG.  His d-     dimer  is normal, hence pulmonary embolus  is less likely.  It could      be related to acid reflux disease.  2. He also has anemia along with a history of black stools.  Hence,      the gastrointestinal bleeding has to be in the top of the      differential reasons for his anemia.  He has a cramping pain in his      lower extremities which are also not very specific but have been      ongoing for a few years; hence, they are not acute at this time.   PLAN:  1. Chest pain:  We will admit to telemetry.  Rule him out for acute      coronary syndrome.  Check a lipid profile in the morning.  Check      TSH.  I will order EKGs to make sure he does not have any dynamic      changes.  We will defer cardiology input for now unless he rules      in.  We will also check an echocardiogram to look for his EF and      wall motion and also to evaluate his systolic murmur as he could      have aortic stenosis.  2. Possible gastrointestinal bleed with history of melena and anemia:      His stool was positive for hemoccult testing.  We will obtain a GI      consult in the morning to consider upper and lower endoscopy on      this individual.  Continue PPI for now.  3. Anemia:  We will check iron profile studies.  Because of his      history of coronary artery disease, I think we will go ahead and      transfuse him two units of blood to make sure he is around 10 grams      of hemoglobin.  We will also check LFTs.  4. Coronary artery disease as above.  5. Diabetes:  We will hold his glimepiride and just put him on sliding      scale.  Give him some diabetic clear liquids for now.  6. Hypertension:  Continue all of his medications.  Keep a close watch      on his blood pressure.  7. DVT prophylaxis with TED stockings and SCDs and we want to avoid      any heparin products at this time.  We will also withhold his      aspirin and Plavix for now.  As soon as GI has made the      recommendations and if it is okay, this may be initiated in the       next day or two.   He is a full code at this time.   Further management decision will be based on his initial testing and the  patient's response to treatment.      Bonnielee Haff, MD  Electronically Signed     GK/MEDQ  D:  05/04/2006  T:  05/04/2006  Job:  [id number]   cc:   Duke Salvia, M.D.  Fax: [phone]

## 2010-08-08 NOTE — Procedures (Signed)
NAME:  Donald Gaines, Donald Gaines              ACCOUNT NO.:  [id number]   MEDICAL RECORD NO.:  [id number]          PATIENT TYPE:  INP   LOCATION:  A204                          FACILITY:  [facility]   PHYSICIAN:  Edward L. Luan Pulling, M.D.DATE OF BIRTH:  [birth year]   DATE OF PROCEDURE:  DATE OF DISCHARGE:  05/07/2006                            PULMONARY FUNCTION TEST   1. Spirometry shows a mild ventilatory defect with evidence of airflow      obstruction.  2. Lung volumes show a total lung capacity of 77%, suggesting a      separate restrictive lung disease.  Noting the patient's height and      weight this may be related to body habitus.  3. DLCO is moderately reduced.  4. Arterial blood gases are normal.      Edward L. Luan Pulling, M.D.  Electronically Signed     ELH/MEDQ  D:  05/08/2006  T:  05/08/2006  Job:  [id number]   cc:   Cristopher Estimable. Lattie Haw, [place], [address]  [place], [place] [id number]

## 2010-08-08 NOTE — Consult Note (Signed)
NAME:  Donald Gaines, Donald Gaines              ACCOUNT NO.:  [id number]   MEDICAL RECORD NO.:  [id number]          PATIENT TYPE:  AMB   LOCATION:                                FACILITY:  [facility]   PHYSICIAN:  Caro Hight, M.D.      DATE OF BIRTH:  12-Sep-1940   DATE OF CONSULTATION:  DATE OF DISCHARGE:                                 CONSULTATION   PROBLEM LIST:  1. Cecal and ileocecal valve ulcers of unclear etiology.  The      differential diagnosis includes ischemia versus nonsteroidal anti-      inflammatory drug-induced ulcer.  Pathology included fragments of      exudate with small amounts of attached granulation tissue      consistent with ulcer.  The tissue had markedly atypical cells      characterized by nuclear enlargement and irregularity with      prominent nucleoli and few mitotic figures.  While the cells were      atypical and associated with exudate most likely represent atypical      stromal or endothelial cells in the setting of ablation and no high-      grade dysplasia or invasive carcinoma was identified, the etiology      of these cells were unclear.  2. Coronary artery disease.  3. Diabetes.  4. Adenomatous polyps diagnosed in 2008.  5. Duodenal lipoma.  6. Hyperlipidemia.  7. Gastroesophageal reflux disease.  8. History of coronary artery bypass graft in 1994.  9. Angioplasty in 2000.  10.Right inguinal hernia repair.  11.History of alcohol abuse, last alcohol use 5 years ago.  12.Tobacco abuse, 15 cigarettes per day.   SUBJECTIVE:  Donald Gaines is a 70 year old male who presents as a return  patient visit.  He has had no bleeding from his bowels.  He has 2-3  bowel movements a day.  He complains of sweating all the time.  He has  no abdominal pain.  He saw Dr. Michela Pitcher for urinary his complaints.  He  was seen and evaluated by Dr. Amedeo Plenty for his coronary artery stenosis and  he was told to stop smoking.  He is down from one pack a day to 15  cigarettes a day.  He was  seen by Dr. Delphina Cahill for his hypertension.   MEDICATIONS:  1. Vytorin 10/20 daily.  2. Cyclobenzaprine 10 mg daily.  3. Coreg 6.5 mg b.i.d.  4. Lasix 40 mg daily.  5. Aricept 10 mg daily.  6. Amlodipine 10 mg daily.  7. Cozaar 50 mg daily.  8. Glimepiride 2 mg daily.  9. Paxil 30 mg daily.  10.Plavix 75 mg daily.  11.Omeprazole 20 mg b.i.d.  12.Advair 250/50 daily.   OBJECTIVE:  VITAL SIGNS:  Weight 208 pounds, height 5 feet 7-1/2 inches,  temperature 97.9, blood pressure 100/60, pulse 80.  GENERAL:  He is in no apparent distress.LUNGS:  Clear to auscultation  bilaterally.CARDIOVASCULAR:  Exam is a regular rhythm.  No murmur.  Normal S1 and S2.ABDOMEN:  Bowel sounds present and soft, nontender,  nondistended, no rebound or guarding.  ASSESSMENT:  Donald Gaines is a 70 year old male with a history of cecal  ulcer with atypical cells.  The ulcers were thought secondary to  ischemia versus nonsteroidal anti-inflammatory drug-induced. Thank you  for allowing me to see Donald Gaines in consultation.  My recommendations  follow.   RECOMMENDATIONS:  1. A colonoscopy will be scheduled within the next month to reevaluate      his cecum because of the atypical cells with nuclear enlargement      and irregularity seen on the biopsies performed in February 2008.      I would like to document the healing of this area and if the ulcers      persist re-biopsy to look for dysplasia and evidence of invasive      carcinoma.  If atypical cells persist then Donald Gaines would      consider right hemicolectomy.  He is asked to stop his Plavix 5      days prior to his procedure.  He will hold his glimepiride on the      morning of his procedure.  He will hold his amlodipine of the      morning of his procedure.  He will take half of his Lasix dose on      the day prior to his procedure.  2. He will be scheduled a follow up appointment based on the results      of his colonoscopy.  He will take a  HalfLytely bowel prep.      Caro Hight, M.D.  Electronically Signed     SM/MEDQ  D:  06/28/2006  T:  06/28/2006  Job:  [id number]   cc:   Delphina Cahill, M.D.  Fax: [phone]

## 2010-08-08 NOTE — Op Note (Signed)
NAME:  JAGAN, VODA              ACCOUNT NO.:  [id number]   MEDICAL RECORD NO.:  [id number]          PATIENT TYPE:  INP   LOCATION:  A204                          FACILITY:  [facility]   PHYSICIAN:  Caro Hight, M.D.      DATE OF BIRTH:  April 20, 1940   DATE OF PROCEDURE:  05/05/2006  DATE OF DISCHARGE:                                PROCEDURE NOTE   PROCEDURE:  1. Colonoscopy with cold forceps biopsy and snare cautery polypectomy.  2. Esophagogastroduodenoscopy with cold forceps biopsy.   INDICATION FOR EXAMINATION:  Mr. Escue is a 70 year old male who  presents with reported black tarry stools and anemia.  He is chronically  maintained on aspirin due to his coronary artery disease.  He was  originally scheduled for an upper endoscopy today as an outpatient.  He  presented to the emergency department on yesterday with shortness of  breath and atypical chest pain.  He denies any bright red blood per  rectum.  He denies any use of BCs, Goody's, Advil, Aleve, ibuprofen, or  Motrin.  He does take Flexeril, Remeron, Paxil, and Vicodin as an  outpatient.  His last colonoscopy was in 2002, and he denies any history  of polyps.  His hemoglobin on admission was 10.7 with an MCV of 74.3.   FINDINGS:  1. Mr. Rosenboom had a tortuous sigmoid colon which made intubating his      terminal ileum impossible.  I was able to intubate his cecum.  He      had a 6-mm flat cecal ulcer with no visible vessel.  Biopsies were      obtained via cold forceps.  2. He had 2 large ileocecal ulcers that were flat.  Biopsies were      taken via jumbo forceps.  He had left-sided diverticulosis.  3. A 6-mm rectosigmoid polyp removed via snare cautery.  4. Moderate-sized internal hemorrhoids.  5. A 6-mm submucosal lesion with pill-like consistency, likely a      lipoma.  Cold forceps biopsies taken.  Otherwise, normal esophagus      without evidence of Barrett's.  Normal stomach without evidence of      erythema,  erosions, or ulcerations.   RECOMMENDATIONS:  1. Mr. Hadsell cecal and ileocecal valve ulcers are likely the      source of his GI Bleed.  He should continue to take his iron as an      outpatient.  I will call him with the results of his biopsies.  2. He should continue a low-residue diabetic diet for the next 3      weeks.  3. He should avoid aspirin and Plavix until he sees me as a followup      in 3 weeks.  4. He should have no anticoagulation for 7 days due to his biopsy and      polyp removal.  5. He will likely need a screening colonoscopy in 5 years.  If this      polyp adenomatous, then his children and brothers and sisters      should have a screening colonoscopy beginning  at age 71 and then      every 10 years, unless they are diagnosed with an adenomatous      polyp.   MEDICATIONS:  1. Colonoscopy-Demerol 125 mg IV, Versed 7 mg IV.  2. Esophagogastroduodenoscopy-Demerol 25 mg IV, Versed 1 mg IV.   PROCEDURAL TECHNIQUE:  Physical exam was performed and informed consent  was obtained for the patient after explaining the benefits, risks, and  alternatives to the procedure.  The patient was connected to the monitor  and placed in the left lateral position.  Continuous oxygen was provided  by nasal cannula and IV medicine administered through the indwelling  cannula.  After administration of sedation and rectal exam, the  patient's rectum was intubated and the scope was advanced under direct  visualization to the cecum.  The scope was subsequently removed slowly  by carefully examining the color, texture, anatomy, integrity, and  mucosa on the way out.   After the colonoscopy, the patient's esophagus was intubated with a  diagnostic gastroscope and the scope was advanced under direct  visualization to the second portion of the duodenum.  The scope was  subsequently removed slowly by carefully examining the color, texture,  anatomy, integrity, and mucosa on the way out.   Patient was recovered in  the endoscopy suite and discharged to the floor in satisfactory  condition.      Caro Hight, M.D.  Electronically Signed     SM/MEDQ  D:  05/05/2006  T:  05/05/2006  Job:  [id number]   cc:   Duke Salvia, M.D.  Fax: [phone]

## 2010-08-08 NOTE — Procedures (Signed)
NAME:  Donald Gaines, Donald Gaines              ACCOUNT NO.:  [id number]   MEDICAL RECORD NO.:  [id number]          PATIENT TYPE:  INP   LOCATION:  A204                          FACILITY:  [facility]   PHYSICIAN:  Cristopher Estimable. Lattie Haw, MD, FACCDATE OF BIRTH:  October 28, 1940   DATE OF PROCEDURE:  05/04/2006  DATE OF DISCHARGE:                                ECHOCARDIOGRAM   REFERRING:  1. Dr. Maryland Pink.  2. Dr. Lattie Haw.   CLINICAL DATA:  A 70 year old gentleman with a systolic murmur,  myocardial infarction, hypertension and diabetes.   M-MODE:  Aorta 3.3, left atrium 5.1, septum 1.6, posterior wall 1.6, LV  diastole 4.5, LV systole 3.4.   IMPRESSION:  1. Technically suboptimal but adequate echocardiographic study.  2. Moderate left atrial enlargement; normal right atrial size.  3. Normal right ventricular size and function; moderate RVH.  4. Normal mitral valve; mild annular calcification.  5. Mild sclerosis of a trileaflet aortic valve.  6. Normal internal dimension of the proximal ascending aorta; mild      calcification of the wall and annulus.  7. Normal tricuspid valve with physiologic regurgitation.  8. Normal pulmonic valve and proximal pulmonary artery.  9. Normal left ventricular size; moderate hypertrophy; normal regional      and global function.   Per      Cristopher Estimable. Lattie Haw, MD, [facility]  Electronically Signed     RMR/MEDQ  D:  05/04/2006  T:  05/05/2006  Job:  [id number]

## 2010-08-08 NOTE — Op Note (Signed)
NAME:  BATU, KITTLES              ACCOUNT NO.:  [id number]   MEDICAL RECORD NO.:  [id number]          PATIENT TYPE:  AMB   LOCATION:  DAY                           FACILITY:  [facility]   PHYSICIAN:  Caro Hight, M.D.      DATE OF BIRTH:  05-26-1940   DATE OF PROCEDURE:  06/20/2008  DATE OF DISCHARGE:  06/18/2008                               OPERATIVE REPORT   PRIMARY CARE PHYSICIAN:  Delphina Cahill, M.D.   PROCEDURE:  Capsule endoscopy.   DATE OF PROCEDURE:  June 20, 2008.   INDICATION FOR EXAM:  Mr. Graney is a 70 year old male who presented  with a persistent iron deficiency anemia.  A ferritin was 8 and his  hemoglobin was 11.3.  He has a history of anemia caused by gastric and  descending colon AVMs as well as cecal ulcers caused by aspirin.  He  requires chronic maintenance on Plavix for his coronary artery disease.   PATIENT DATA:  Height 67 inches, weight 209 pounds, waist 47 inches,  build protuberant.  Gastric passage time 28 minutes, small bowel passage  time 3 hours and 42 minutes.   FINDINGS:  1. Limited visualization of the gastric mucosa due to retained      contents.  The evaluation of the small bowel begins at 30 hours and      15 minutes.  At 33 hours and 31 minutes the first AVM was      visualized.  He had continued visualization of AVMs up until 1 hour      and 11 minutes and 25 seconds.  The AVMs are located in the first      20% of the small intestines and constitute 18% of the small bowel      capsule time.  The AVMs seemed to be most numerous between 33 hours      33 minutes 31 seconds and 41 minutes 40 seconds.  No active      bleeding was noted.  No masses were seen or ulcerations.  Limited      views of the colon due to retained colonic material.   DIAGNOSIS:  Mr. Dehring has a persistent iron-deficiency anemia  secondary to small-bowel arteriovenous malformations.  His condition is  exacerbated by the need for antiplatelet therapy.    RECOMMENDATIONS:  1. I discussed the findings of the capsule endoscopy with Mr.      Ebersole.  He is to continue his Plavix.  He is to continue iron      twice a day indefinitely.  If he is unable to keep up with his iron      losses then could consider IV iron infusion or double balloon      enteroscopy to ablate the AVMs, which could be performed at [facility].  2. He has a follow-up appointment to see me in 3 months and will      recheck his hemoglobin and hematocrit at that time.   ADDENDUM 6110:  Hb 11-May 2010, OPV June 30.  Caro Hight, M.D.  Electronically Signed     SM/MEDQ  D:  06/28/2008  T:  06/28/2008  Job:  [id number]   cc:   Delphina Cahill, M.D.  Fax: [phone]

## 2010-08-08 NOTE — Discharge Summary (Signed)
NAME:  Donald Gaines, CAMPI NO.:  [id number]   MEDICAL RECORD NO.:  [id number]          PATIENT TYPE:  INP   LOCATION:  A204                          FACILITY:  [facility]   PHYSICIAN:  Salem Caster, DO    DATE OF BIRTH:  Oct 24, 1940   DATE OF ADMISSION:  05/03/2006  DATE OF DISCHARGE:  02/15/2008LH                               DISCHARGE SUMMARY   DISCHARGE SUMMARY   DISCHARGE DIAGNOSES:  1. Acute gastrointestinal bleed.  2. Ileocecal ulcers.  3. Atypical chest pain.  4. Anemia.  5. Type 2 diabetes.  6. Hypertension.  7. Coronary artery disease.   BRIEF HOSPITAL COURSE:  This is a 70 year old Caucasian male with a  history of diabetes, hypertension, coronary artery disease and some  dementia who presented with 2-3 days of left-sided chest pain which  migrated to his right side.  The patient at that time described the pain  as 9/10 and associated with shortness of breath.  Denied any  palpitations, diaphoresis, nausea or vomiting.  The patient also stated  that about 2 weeks ago he had a blood test done in his primary care  physician's office and was told he had severe anemia and was scheduled  for a colonoscopy in the near future.  Patient was seen in the emergency  room and found to have his hemoglobin in the 8.3 range and he was  hemoccult positive for his stool.  The patient also had an EKG which  showed normal sinus rhythm.  He did not have any Q-waves.  There were  some T-wave inversions noted.  He had some ST depression.  There were no  old EKGs for comparison at that time.  Chest x-ray showed some  cardiomegaly with some mild vascular congestion and some mild basilar  submental atelectasis.  The patient was admitted, placed on chest pain  protocol and patient had a Cardiology consult.  The patient's enzymes  were negative and he no longer had any chest pain.  The patient was  instructed to have a follow-up visit with Cardiology as an outpatient.  The  patient did have a carotid ultrasound which showed plaque formation  bilaterally, carotid, with the right ICA 50-69 percent stenosed, the  left ICA slightly less and probably approaching 50 percent.  For  patient's anemia, patient was transfused 2 units of packed red blood  cells.  GI consult was also obtained and patient was found to have  ileocecal ulcers.  The patient was stopped on all aspirin, NSAIDs or an  anticoagulants during hospital stay.  The patient's hemoglobin has  improved.  The patient is pain free and now ready for discharge.   Vitals on discharge:  Temperature is 97.1, pulse 67, respirations 18,  blood pressure is 152/84.  The patient was sating 96% on room air.  Patient's white count was 7.1, hemoglobin was 10.4, hematocrit 33.3.  Sodium 143, potassium 3.8, chloride 106, CO2 26, glucose 109, BUN 8,  creatinine 0.99.   MEDICATIONS ON DISCHARGE:  Will include:  Aricept 10 mg daily.  Coreg 3.125 p.o. b.i.d.  Cozaar 50 mg daily.  Flexeril 10 mg t.i.d.  Glimepiride 2 mg daily.  Isosorbide mononitrate 120 mg once a day.  Lasix 40 mg daily.  Mirtazapine 50 mg q.h.s.  Paroxetine 30 mg once a day.  Norvasc 10 mg daily.  Vytorin 10/20 once a day.  Omeprazole 20 mg once a day.  Hydrocodone p.o. p.r.n.  Hemocyte 1 tab p.o. b.i.d.   CONDITION ON DISCHARGE:  Stable.   DIET:  The patient is to be on a low fiber diet.   ACTIVITY:  Increase activity slowly.   Patient is instructed to follow up with Dr. Feliciana Rossetti in 1-2 weeks.  Patient  probably will also need a repeat CBC to check his hemoglobin level.  Patient also instructed to follow up with Dr. Stann Mainland in 3 weeks.  The  phone number is [phone].  Patient approached about leaving AMA before  discharge instructions were given.  I tried to explain to patient need  to have discharge instructions before he left.  Patient was adamant that  he wanted to leave, so patient was not given these discharge  instructions.  These were to  be kept on the chart and sent to the  patient.  Patient was instructed to follow up again in the emergency  room if he notices any rectal bleeding or severe abdominal pain.  Of  note, patient's carotid did show some stenosis.  Patient will need a  consult or a referral to a vascular surgeon regarding his carotid artery  stenosis.  Unable to explain this to patient.  The patient probably  needs to follow up with Dr. Feliciana Rossetti regarding a referral to a vascular  surgeon regarding this.      Salem Caster, DO  Electronically Signed     SM/MEDQ  D:  05/07/2006  T:  05/07/2006  Job:  [id number]   cc:   Duke Salvia, M.D.  Fax: [phone]   Caro Hight, M.D.  [address]  [place] , [place] [address]

## 2010-08-25 ENCOUNTER — Other Ambulatory Visit: Payer: Self-pay

## 2010-08-25 MED ORDER — CLOPIDOGREL BISULFATE 75 MG PO TABS: 75.0000 mg | ORAL_TABLET | Freq: Every day | ORAL | Status: DC

## 2010-08-25 MED ORDER — ROSUVASTATIN CALCIUM 20 MG PO TABS: 20.0000 mg | ORAL_TABLET | Freq: Every day | ORAL | Status: DC

## 2010-08-27 ENCOUNTER — Other Ambulatory Visit: Payer: Self-pay | Admitting: *Deleted

## 2010-08-27 MED ORDER — CLOPIDOGREL BISULFATE 75 MG PO TABS: 75.0000 mg | ORAL_TABLET | Freq: Every day | ORAL | Status: DC

## 2010-09-03 ENCOUNTER — Other Ambulatory Visit: Payer: Self-pay | Admitting: *Deleted

## 2010-09-03 MED ORDER — ROSUVASTATIN CALCIUM 20 MG PO TABS: 20.0000 mg | ORAL_TABLET | Freq: Every day | ORAL | Status: DC

## 2010-09-18 ENCOUNTER — Ambulatory Visit (INDEPENDENT_AMBULATORY_CARE_PROVIDER_SITE_OTHER): Payer: Medicare Other | Admitting: Cardiology

## 2010-09-18 ENCOUNTER — Encounter: Payer: Self-pay | Admitting: Cardiology

## 2010-09-18 VITALS — BP 100/59 | HR 68 | Ht 65.0 in | Wt 191.0 lb

## 2010-09-18 DIAGNOSIS — E782 Mixed hyperlipidemia: Secondary | ICD-10-CM

## 2010-09-18 DIAGNOSIS — F17200 Nicotine dependence, unspecified, uncomplicated: Secondary | ICD-10-CM

## 2010-09-18 DIAGNOSIS — I2510 Atherosclerotic heart disease of native coronary artery without angina pectoris: Secondary | ICD-10-CM

## 2010-09-18 DIAGNOSIS — I10 Essential (primary) hypertension: Secondary | ICD-10-CM

## 2010-09-18 NOTE — Patient Instructions (Signed)
Your physician recommends that you continue on your current medications as directed. Please refer to the Current Medication list given to you today.  Your physician recommends that you schedule a follow-up appointment in: 6 months  

## 2010-09-18 NOTE — Assessment & Plan Note (Signed)
Continue to recommend smoking cessation. 

## 2010-09-18 NOTE — Assessment & Plan Note (Signed)
Blood pressure well-controlled today. 

## 2010-09-18 NOTE — Progress Notes (Signed)
Clinical Summary [name] is a 70 y.o.male presenting for followup. He was last seen in April.  He presents for followup of his wife. Reports no progressive angina or shortness of breath, has used to sublingual much with her and since his last visit. Medical therapy looks good overall, and blood pressure and heart rate are well controlled.  We have discussed the possibility of ultimately considering a cardiac catheterization if angina progresses on optimal medical therapy. Limiting this strategy however, is a history of gastrointestinal bleeding with small bowel AVMs. Fortunately, he has been able to stay on Plavix, although has not been on dual antiplatelet therapy for quite some time. Obviously, if he did have a percutaneous intervention, he would need at least a limited course of dual antiplatelet therapy, therefore increasing his bleeding risk.   Allergies  Allergen Reactions  . Penicillins     Current outpatient prescriptions:Albuterol Sulfate (PROAIR HFA IN), Inhale into the lungs.  , Disp: , Rfl: ;  ALPRAZolam (XANAX) 0.5 MG tablet, Take 0.5 mg by mouth at bedtime as needed.  , Disp: , Rfl: ;  carvedilol (COREG) 6.25 MG tablet, Take 6.25 mg by mouth 2 (two) times daily with a meal.  , Disp: , Rfl: ;  cetirizine (ZYRTEC) 10 MG tablet, Take 10 mg by mouth daily.  , Disp: , Rfl:  clopidogrel (PLAVIX) 75 MG tablet, Take 1 tablet (75 mg total) by mouth daily., Disp: 30 tablet, Rfl: 6;  dutasteride (AVODART) 0.5 MG capsule, Take 0.5 mg by mouth daily. , Disp: , Rfl: ;  FLUoxetine (PROZAC) 20 MG capsule, Take 20 mg by mouth 2 (two) times daily. , Disp: , Rfl: ;  fluticasone (FLONASE) 50 MCG/ACT nasal spray, , Disp: , Rfl: ;  furosemide (LASIX) 40 MG tablet, Take 40 mg by mouth 1 dose over 46 hours.  , Disp: , Rfl:  glimepiride (AMARYL) 2 MG tablet, Take 2 mg by mouth daily before breakfast.  , Disp: , Rfl: ;  HYDROcodone-acetaminophen (VICODIN) 5-500 MG per tablet, Take 1 tablet by mouth every 6  (six) hours as needed.  , Disp: , Rfl: ;  isosorbide mononitrate (IMDUR) 60 MG 24 hr tablet, Take 60 mg by mouth daily.  , Disp: , Rfl: ;  NITROSTAT 0.4 MG SL tablet, , Disp: , Rfl:  omeprazole (PRILOSEC) 20 MG capsule, Take 20 mg by mouth 2 (two) times daily. 1 30 MIN BEFORE MEALS TWICE DAILY, Disp: , Rfl: ;  rosuvastatin (CRESTOR) 20 MG tablet, Take 1 tablet (20 mg total) by mouth daily., Disp: 30 tablet, Rfl: 6;  sitaGLIPtan-metformin (JANUMET) 50-1000 MG per tablet, Take 1 tablet by mouth 2 (two) times daily with a meal.  , Disp: , Rfl:  Tamsulosin HCl (FLOMAX) 0.4 MG CAPS, Take 0.4 mg by mouth at bedtime as needed.  , Disp: , Rfl: ;  VESICARE 5 MG tablet, Take 5 mg by mouth daily. , Disp: , Rfl:   Past Medical History  Diagnosis Date  . TIA (transient ischemic attack)     2005  . GERD (gastroesophageal reflux disease)   . Coronary atherosclerosis of native coronary artery     Multivessel, BMS SVG TO RCA 2000, reportedly  "2" additional stents in interim  . Hyperlipidemia   . Hypertension   . Bilateral carotid artery stenosis   . Depression   . Diabetes mellitus, type 2   . Restrictive lung disease   . Candida esophagitis   . Hemorrhoids   . Arteriovenous malformation small  bowel     Capsule study 3/10  . GI bleeding     Cecal ulcers, arteriovenous malformations    Past Surgical History  Procedure Date  . Esophagogastroduodenoscopy 03/09    DUODENAL LIPOMA  . Colonoscopy 03/09  . Capsule endo 03/10  . Right inguinal hernia repair   . Coronary artery bypass graft 1994  . Hydrogen breath test 2009    No family history on file.  Social History [name] reports that he has been smoking Cigarettes.  He has never used smokeless tobacco. [name] reports that he does not drink alcohol.  Review of Systems Complains of problems with intermittent bowel and bladder incontinence. Stable appetite. Does report weight loss approximately 20 pounds over several months. No fevers  or chills. No orthopnea or PND. No reported melena or hematochezia. Otherwise negative.  Physical Examination Filed Vitals:   09/18/10 1131  BP: 100/59  Pulse: 68   Obese male no acute distress.  HEENT: Conjunctiva and lids normal, oropharynx with poor dentition.  Neck: Supple, no elevated JVP or bruits, or thyromegaly.  Lungs: Clear to auscultation, nonlabored.  Cardiac: Regular rate and rhythm, indistinct PMI, soft basal systolic murmur, no S3 gallop.  Abdomen: Protuberant, obese, bowel sounds present, no tenderness.  Skin: Warm and dry, scattered tattoos noted.  Extremities: Trace ankle edema, distal pulses one plus.  Musculoskeletal: No kyphosis.  Neuropsychiatric: Alert and oriented x3, affect appropriate.   ECG Sinus rhythm at 72 beats per minute with nonspecific ST-T wave changes and LVH.   Problem List and Plan

## 2010-09-18 NOTE — Assessment & Plan Note (Signed)
Has been tightly controlled over time on statin therapy.

## 2010-09-18 NOTE — Assessment & Plan Note (Signed)
At this point relatively stable symptomatically on medical therapy. No changes made today.

## 2010-09-26 ENCOUNTER — Encounter: Payer: Self-pay | Admitting: Cardiology

## 2010-09-30 ENCOUNTER — Other Ambulatory Visit: Payer: Self-pay | Admitting: Gastroenterology

## 2010-10-01 LAB — HEMOGLOBIN: Hemoglobin: 12.4 g/dL — ABNORMAL LOW (ref 13.0–17.0)

## 2010-10-02 ENCOUNTER — Encounter: Payer: Self-pay | Admitting: Gastroenterology

## 2010-10-02 ENCOUNTER — Ambulatory Visit (INDEPENDENT_AMBULATORY_CARE_PROVIDER_SITE_OTHER): Payer: Medicare Other | Admitting: Gastroenterology

## 2010-10-02 VITALS — BP 92/55 | HR 73 | Temp 98.0°F | Ht 65.0 in | Wt 192.4 lb

## 2010-10-02 DIAGNOSIS — R197 Diarrhea, unspecified: Secondary | ICD-10-CM

## 2010-10-02 MED ORDER — DICYCLOMINE HCL 10 MG PO CAPS: ORAL_CAPSULE | ORAL | Status: DC

## 2010-10-02 NOTE — Progress Notes (Signed)
Cc to PCP & Dr. Lattie Haw

## 2010-10-02 NOTE — Patient Instructions (Addendum)
Take TWO TUMS WITH MEALS. Use no more than 6 TUMS a day. Use DICYCLOMINE as needed for diarrhea. Submit stool studies. Follow up in 3 mos.

## 2010-10-02 NOTE — Assessment & Plan Note (Signed)
Etiology unclear: ? Infection, diabetic enteropathy, lactose intolerance, IBS-mixed pattern.  Take TWO TUMS WITH MEALS. Use no more than 6 TUMS a day. Use DICYCLOMINE as needed for diarrhea. Submit stool studies. Follow up in 3 mos.

## 2010-10-02 NOTE — Progress Notes (Signed)
Subjective:    Patient ID: Donald Gaines, male    DOB: 14-Apr-1940, 70 y.o.   MRN: [id number]  PCP: [facility]  1o CV: ROTHBART  HPI WAS CONSTIPATED NOW HAS DIARRHEA. Loose stool and 80% time it's brown water. Sx since SEP last year. Today: nl stool. Pain in abd(lwr): 2x/week. Gets better after BM. Appetite: no good, doesn't care whether he eats. Just doesn't get hungry. Late evening he eats. Decreased appetite for 3 mos. No blood in stool. Does eat chocolate ice cream. Eats NABS AND BANANAS during the day to keep sugar up. NO TRAVEL, OR ABX IN 2012.   Past Medical History  Diagnosis Date  . TIA (transient ischemic attack)     2005  . GERD (gastroesophageal reflux disease)   . Coronary atherosclerosis of native coronary artery     Multivessel, BMS SVG TO RCA 2000, reportedly  "2" additional stents in interim  . Hyperlipidemia   . Hypertension   . Bilateral carotid artery stenosis   . Depression   . Diabetes mellitus, type 2   . Restrictive lung disease   . Candida esophagitis   . Hemorrhoids   . Arteriovenous malformation small bowel     Capsule study 3/10  . GI bleeding     Cecal ulcers, arteriovenous malformations  . Chronic diarrhea SEP 2011    ?ETIOLOGY-DIABETIC ENTEROPATHY, LACTOSE INTOLERANCE,  OR IBS-MIXED  . Bloating AUG 2009    NL HBT FOR SIBO  . Anemia FEB 2008 FEDA 2o to AVMs, & large colon ulcers    HB 10.7 MCV 74.3    Past Surgical History  Procedure Date  . Esophagogastroduodenoscopy SEP 09    DUODENAL LIPOMA  . Capsule endo 03/10  . Right inguinal hernia repair   . Coronary artery bypass graft 1994  . Hydrogen breath test 2009  . Colonoscopy APR 2008    SIMPLE ADENOMA, [place] TICS, IH  . Colonoscopy FEB 2008 ANEMIA. MELENA     2 LARGE ILEOCEAL ULCERS 2o to ASA, [place] TICS, IH  . Colonoscopy SEP 2009 TRANSFUSION DEP ANEMIA    AC AVM-ABLATED, [place] TICS, IH  . Upper gastrointestinal endoscopy SEP 2009    GASTRIC AVM ABLATED, NL DUO Bx   Allergies  Allergen  Reactions  . Penicillins     Current Outpatient Prescriptions  Medication Sig Dispense Refill  . ADVAIR DISKUS 250-50 MCG/DOSE AEPB       . Albuterol Sulfate (PROAIR HFA IN) Inhale into the lungs.        . ALPRAZolam (XANAX) 0.5 MG tablet Take 0.5 mg by mouth at bedtime as needed.        . carvedilol (COREG) 6.25 MG tablet Take 6.25 mg by mouth 2 (two) times daily with a meal.        . cetirizine (ZYRTEC) 10 MG tablet Take 10 mg by mouth daily.        . clopidogrel (PLAVIX) 75 MG tablet Take 1 tablet (75 mg total) by mouth daily.  30 tablet  6  . dutasteride (AVODART) 0.5 MG capsule Take 0.5 mg by mouth daily.       Marland Kitchen Fesoterodine Fumarate (TOVIAZ) 8 MG TB24 Take 8 mg by mouth.        Marland Kitchen FLUoxetine (PROZAC) 20 MG capsule Take 20 mg by mouth 2 (two) times daily.       . fluticasone (FLONASE) 50 MCG/ACT nasal spray       . furosemide (LASIX) 40 MG tablet Take 40 mg  by mouth 1 dose over 46 hours.        Marland Kitchen glimepiride (AMARYL) 2 MG tablet Take 2 mg by mouth at BEDTIME        . HYDROcodone-acetaminophen (VICODIN) 5-500 MG per tablet Take 1 tablet by mouth every 6 (six) hours as needed.        . isosorbide mononitrate (IMDUR) 60 MG 24 hr tablet Take 60 mg by mouth daily.        Marland Kitchen losartan (COZAAR) 50 MG tablet       . NITROSTAT 0.4 MG SL tablet       . omeprazole (PRILOSEC) 20 MG capsule Take 20 mg by mouth 2 (two) times daily. 1 30 MIN BEFORE MEALS TWICE DAILY      . rosuvastatin (CRESTOR) 20 MG tablet Take 1 tablet (20 mg total) by mouth daily.  30 tablet  6  . sitaGLIPtan-metformin (JANUMET) 50-1000 MG per tablet Take 1 tablet by mouth daily with a meal.        . Tamsulosin HCl (FLOMAX) 0.4 MG CAPS Take 0.4 mg by mouth at bedtime as needed.        . VESICARE 5 MG tablet Take 5 mg by mouth daily.            Review of Systems     Objective:   Physical Exam  Vitals reviewed. Constitutional: He is oriented to person, place, and time. He appears well-nourished. No distress.  HENT:    Head: Normocephalic.  Mouth/Throat: No oropharyngeal exudate.  Eyes: Pupils are equal, round, and reactive to light. No scleral icterus.  Neck: Normal range of motion. Neck supple.  Cardiovascular: Normal rate, regular rhythm and normal heart sounds.   Pulmonary/Chest: Effort normal and breath sounds normal.  Abdominal: Soft. Bowel sounds are normal. He exhibits no distension. There is no tenderness.  Lymphadenopathy:    He has no cervical adenopathy.  Neurological: He is alert and oriented to person, place, and time.  Psychiatric: He has a normal mood and affect.          Assessment & Plan:

## 2010-10-03 ENCOUNTER — Telehealth: Payer: Self-pay

## 2010-10-03 NOTE — Telephone Encounter (Signed)
Per Dr Oneida Alar, I called Dr. Juel Burrow this AM , spoke to [facility] and scheduled pt appt at 11:15 AM today. Called and told the pt's wife who said they will be sure and go.

## 2010-10-04 NOTE — Telephone Encounter (Signed)
NOTED

## 2010-10-07 NOTE — Progress Notes (Signed)
PT SCHEDULED FOR 42M APPT ON 10/10 W/ DR FIELDS

## 2010-10-09 ENCOUNTER — Encounter (HOSPITAL_COMMUNITY): Payer: Medicare Other | Attending: Oncology

## 2010-10-09 ENCOUNTER — Other Ambulatory Visit: Payer: Self-pay | Admitting: Gastroenterology

## 2010-10-09 VITALS — BP 103/69 | HR 59 | Temp 98.2°F

## 2010-10-09 DIAGNOSIS — D638 Anemia in other chronic diseases classified elsewhere: Secondary | ICD-10-CM

## 2010-10-09 DIAGNOSIS — D509 Iron deficiency anemia, unspecified: Secondary | ICD-10-CM

## 2010-10-09 LAB — CBC
MCH: 32.7 pg (ref 26.0–34.0)
MCHC: 34.2 g/dL (ref 30.0–36.0)
Platelets: 165 10*3/uL (ref 150–400)
RBC: 3.76 MIL/uL — ABNORMAL LOW (ref 4.22–5.81)

## 2010-10-09 MED ORDER — FERUMOXYTOL INJECTION 510 MG/17 ML
510.0000 mg | Freq: Once | INTRAVENOUS | Status: AC
  Administered 2010-10-09: 510 mg via INTRAVENOUS
  Filled 2010-10-09: qty 17

## 2010-10-10 LAB — FERRITIN: Ferritin: 282 ng/mL (ref 22–322)

## 2010-10-15 ENCOUNTER — Other Ambulatory Visit (HOSPITAL_COMMUNITY): Payer: Self-pay | Admitting: Oncology

## 2010-10-16 ENCOUNTER — Encounter (HOSPITAL_BASED_OUTPATIENT_CLINIC_OR_DEPARTMENT_OTHER): Payer: Medicare Other

## 2010-10-16 VITALS — BP 105/66 | HR 59 | Temp 97.7°F

## 2010-10-16 DIAGNOSIS — D509 Iron deficiency anemia, unspecified: Secondary | ICD-10-CM

## 2010-10-16 MED ORDER — HEPARIN SOD (PORK) LOCK FLUSH 100 UNIT/ML IV SOLN: 500.0000 [IU] | Freq: Once | INTRAVENOUS | Status: DC | PRN

## 2010-10-16 MED ORDER — SODIUM CHLORIDE 0.9 % IV SOLN: Freq: Once | INTRAVENOUS | Status: DC

## 2010-10-16 MED ORDER — SODIUM CHLORIDE 0.9 % IV SOLN
1020.0000 mg | Freq: Once | INTRAVENOUS | Status: AC
  Administered 2010-10-16: 1020 mg via INTRAVENOUS
  Filled 2010-10-16: qty 34

## 2010-10-16 MED ORDER — SODIUM CHLORIDE 0.9 % IJ SOLN: 3.0000 mL | INTRAMUSCULAR | Status: DC | PRN

## 2010-10-16 MED ORDER — SODIUM CHLORIDE 0.9 % IJ SOLN
INTRAMUSCULAR | Status: AC
  Administered 2010-10-16: 10 mL
  Filled 2010-10-16: qty 10

## 2010-10-16 MED ORDER — ALTEPLASE 2 MG IJ SOLR: 2.0000 mg | Freq: Once | INTRAMUSCULAR | Status: DC | PRN

## 2010-10-16 MED ORDER — HEPARIN SOD (PORK) LOCK FLUSH 100 UNIT/ML IV SOLN: 250.0000 [IU] | Freq: Once | INTRAVENOUS | Status: DC | PRN

## 2010-10-16 MED ORDER — SODIUM CHLORIDE 0.9 % IJ SOLN
10.0000 mL | INTRAMUSCULAR | Status: DC | PRN
  Administered 2010-10-16: 10 mL

## 2010-12-03 ENCOUNTER — Encounter (HOSPITAL_COMMUNITY): Payer: BC Managed Care – PPO

## 2010-12-03 ENCOUNTER — Ambulatory Visit (HOSPITAL_COMMUNITY): Payer: Self-pay

## 2010-12-03 ENCOUNTER — Encounter (HOSPITAL_COMMUNITY): Payer: BC Managed Care – PPO | Attending: Oncology | Admitting: Oncology

## 2010-12-03 ENCOUNTER — Encounter (HOSPITAL_COMMUNITY): Payer: Self-pay | Admitting: Oncology

## 2010-12-03 VITALS — BP 104/61 | HR 71 | Temp 97.5°F | Ht 65.0 in

## 2010-12-03 DIAGNOSIS — D638 Anemia in other chronic diseases classified elsewhere: Secondary | ICD-10-CM

## 2010-12-03 DIAGNOSIS — R3915 Urgency of urination: Secondary | ICD-10-CM

## 2010-12-03 DIAGNOSIS — D509 Iron deficiency anemia, unspecified: Secondary | ICD-10-CM

## 2010-12-03 LAB — URINALYSIS, ROUTINE W REFLEX MICROSCOPIC
Leukocytes, UA: NEGATIVE
Protein, ur: NEGATIVE mg/dL
Urobilinogen, UA: 0.2 mg/dL (ref 0.0–1.0)

## 2010-12-03 NOTE — Progress Notes (Addendum)
Wende Neighbors, [place]. [address] [place] [address]  1. Urinary urgency  Urinalysis, Routine w reflex microscopic  2. ANEMIA-IRON DEFICIENCY  CBC, Differential, Comprehensive metabolic panel, Ferritin  3. ANEMIA OF CHRONIC DISEASE  CBC, Differential, Comprehensive metabolic panel, Ferritin    CURRENT THERAPY: Feraheme 1020 mg every 16 weeks  INTERVAL HISTORY: Donald Gaines 70 y.o. male returns for  regular  visit for followup of anemia of chronic disease and iron deficiency anemia.  The patient, his wife, and I had a very long discussion about tobacco abuse and cessation.  Patient education was given to the patient about smoking, side effects of smoking, and cessation options.  We spent a significant amount of time on that subject.  We spoke about the electronic cigarette.  This may be a good option for him at this point in time with the end goal of complete cessation.  The patient reports that he has quit alcohol, marijuana, and cocaine in the past, but he cannot kick the nicotine habit.  A significant amount of time was spent going over the major side effects of tobacco abuse.  The patient reports that his large toes are beginning to change colors and have a cyanotic hue.  The patient tells me that his PCP is following this.  I went over patient education regarding his anemia.  I spent time going over the pathophysiology of iron deficiency anemia and the role og Hgb and ferritin within the body.  The patient admits to urinary urgency and pain.  The patient denies any hematologic complaints.  He denies any cough, SOB, Chest pain, blood in stool, black tarry stool, hematuria.   Past Medical History  Diagnosis Date  . TIA (transient ischemic attack)     2005  . GERD (gastroesophageal reflux disease)   . Coronary atherosclerosis of native coronary artery     Multivessel, BMS SVG TO RCA 2000, reportedly  "2" additional stents in interim  . Hyperlipidemia   . Hypertension   .  Bilateral carotid artery stenosis   . Depression   . Diabetes mellitus, type 2   . Restrictive lung disease   . Candida esophagitis   . Hemorrhoids   . Arteriovenous malformation small bowel     Capsule study 3/10  . GI bleeding     Cecal ulcers, arteriovenous malformations  . Chronic diarrhea SEP 2011    ?ETIOLOGY-DIABETIC ENTEROPATHY, LACTOSE INTOLERANCE,  OR IBS-MIXED  . Bloating AUG 2009    NL HBT FOR SIBO  . Anemia FEB 2008 FEDA 2o to AVMs, & large colon ulcers    HB 10.7 MCV 74.3    has LYMPHOMA; COLONIC POLYPS, ADENOMATOUS; BENIGN NEOPLASM OF ADRENAL GLAND; DIABETES MELLITUS; HYPERLIPIDEMIA; ANEMIA-IRON DEFICIENCY; ANEMIA OF CHRONIC DISEASE; ANEMIA; TOBACCO ABUSE; HYPERTENSION; CORONARY ARTERY DISEASE; CORONARY ATHEROSCLEROSIS NATIVE CORONARY ARTERY; CAROTID ARTERY STENOSIS; HEMORRHOIDS, INTERNAL; EOSINOPHILIC ESOPHAGITIS; GERD; CONSTIPATION; IBS; COLONIC POLYPS, ADENOMATOUS, BENIGN; FATTY LIVER DISEASE; HEPATIC CYST; GALLSTONES; RENAL CYST; ARTERIOVENOUS MALFORMATION; SHORTNESS OF BREATH; NAUSEA; ABDOMINAL BLOATING; DIARRHEA; ABDOMINAL PAIN-GENERALIZED; ALCOHOL ABUSE, HX OF; Essential hypertension, benign; and Mixed hyperlipidemia on his problem list.     is allergic to penicillins.  Donald Gaines does not currently have medications on file.  Past Surgical History  Procedure Date  . Esophagogastroduodenoscopy SEP 09    DUODENAL LIPOMA  . Capsule endo 03/10  . Right inguinal hernia repair   . Coronary artery bypass graft 1994  . Hydrogen breath test 2009  . Colonoscopy APR 2008    SIMPLE ADENOMA,  [place] TICS, IH  . Colonoscopy FEB 2008 ANEMIA. MELENA     2 LARGE ILEOCEAL ULCERS 2o to ASA, [place] TICS, IH  . Colonoscopy SEP 2009 TRANSFUSION DEP ANEMIA    AC AVM-ABLATED,  TICS, IH  . Upper gastrointestinal endoscopy SEP 2009    GASTRIC AVM ABLATED, NL DUO Bx    Denies any headaches, dizziness, double vision, fevers, chills, night sweats, nausea, vomiting, diarrhea,  constipation, chest pain, heart palpitations, shortness of breath, blood in stool, black tarry stool, urinary pain, urinary burning, urinary frequency, hematuria.   PHYSICAL EXAMINATION  ECOG PERFORMANCE STATUS: 1 - Symptomatic but completely ambulatory  Filed Vitals:   12/03/10 1125  BP: 104/61  Pulse: 71  Temp: 97.5 F (36.4 C)    GENERAL:alert, no distress, well nourished, well developed, comfortable, cooperative and smiling SKIN: skin color, texture, turgor are normal HEAD: Normocephalic EYES: normal EARS: External ears normal OROPHARYNX:mucous membranes are moist  NECK: trachea midline LYMPH:  No epitrochlear nodes noted BREAST:not examined LUNGS: clear to auscultation and percussion, with diffusely decreased breath sounds HEART: regular rate & rhythm, no murmurs, no gallops, S1 normal and S2 normal ABDOMEN:abdomen soft, non-tender, obese and normal bowel sounds BACK: Back symmetric, no curvature., No CVA tenderness EXTREMITIES:less then 2 second capillary refill, no joint deformities, effusion, or inflammation, no edema, no skin discoloration, no clubbing, no cyanosis  NEURO: alert & oriented x 3 with fluent speech, no focal motor/sensory deficits, gait normal   LABORATORY DATA: CBC    Component Value Date/Time   WBC 6.9 10/09/2010 1328   RBC 3.76* 10/09/2010 1328   HGB 12.3* 10/09/2010 1328   HCT 36.0* 10/09/2010 1328   PLT 165 10/09/2010 1328   MCV 95.7 10/09/2010 1328   MCH 32.7 10/09/2010 1328   MCHC 34.2 10/09/2010 1328   RDW 13.3 10/09/2010 1328   LYMPHSABS 1.1 07/03/2010 1415   MONOABS 0.4 07/03/2010 1415   EOSABS 0.1 07/03/2010 1415   BASOSABS 0.0 07/03/2010 1415     ASSESSMENT:  1. Iron deficiency anemia 2. Anemia of chronic disease 3. Tobacco abuse   PLAN:  1. Feraheme 1020mg  every 16 weeks as ordered 2. Lab work on 02/05/11:CBC diff, CMET, ferritin 3. Return in 6 months for follow-up 4. Feraheme 1020 mg as scheduled on 02/05/11 5. I personally reviewed  and went over laboratory results with the patient. 6. Patient education regarding tobacco abuse, side effects, and cessation programs. 7. Patient education regarding anemia 8. UA today   All questions were answered. The patient knows to call the clinic with any problems, questions or concerns. We can certainly see the patient much sooner if necessary.  I spent 40 minutes counseling the patient face to face. The total time spent in the appointment was 50 minutes. More than 50% of the time spent with the patient was utilized for counseling.   KEFALAS,THOMAS

## 2010-12-03 NOTE — Patient Instructions (Signed)
[facility]  Discharge Instructions  RECOMMENDATIONS MADE BY THE CONSULTANT AND ANY TEST RESULTS WILL BE SENT TO YOUR REFERRING DOCTOR.     MEDICATIONS PRESCRIBED: Feraheme every 16 weeks and is due in November with labwork   SPECIAL INSTRUCTIONS/FOLLOW-UP: Return to Clinic on  See schedule of appts. given    I acknowledge that I have been informed and understand all the instructions given to me and received a copy. I do not have any more questions at this time, but understand that I may call the Specialty Clinic at [facility] at [phone] during business hours should I have any further questions or need assistance in obtaining follow-up care.    __________________________________________  _____________  __________ Signature of Patient or Authorized Representative            Date                   Time    __________________________________________ Nurse's Signature

## 2010-12-04 ENCOUNTER — Telehealth (HOSPITAL_COMMUNITY): Payer: Self-pay

## 2010-12-04 NOTE — Telephone Encounter (Signed)
Notes Recorded by Robynn Pane, PA on 12/04/2010 at 9:07 AM No UTI. Follow-up with Dr. Maryland Pink  12/04/10 1548 Information given to wife. Simmie Davies, RN

## 2010-12-12 ENCOUNTER — Other Ambulatory Visit: Payer: Self-pay | Admitting: *Deleted

## 2010-12-12 MED ORDER — ISOSORBIDE MONONITRATE ER 60 MG PO TB24: 60.0000 mg | ORAL_TABLET | Freq: Every day | ORAL | Status: DC

## 2010-12-17 LAB — COMPREHENSIVE METABOLIC PANEL
AST: 22
Albumin: 3.6
Calcium: 8.9
Creatinine, Ser: 1.18
GFR calc Af Amer: >60
GFR calc non Af Amer: >60
Total Protein: 6.9

## 2010-12-17 LAB — DIFFERENTIAL
Eosinophils Relative: 2
Lymphocytes Relative: 21
Lymphs Abs: 1.6
Monocytes Relative: 9

## 2010-12-17 LAB — HEMOGLOBIN AND HEMATOCRIT, BLOOD
HCT: 26.1 — ABNORMAL LOW
Hemoglobin: 8.4 — ABNORMAL LOW

## 2010-12-17 LAB — CROSSMATCH: ABO/RH(D): O POS

## 2010-12-17 LAB — CBC
MCHC: 33.3
MCV: 75.8 — ABNORMAL LOW
Platelets: 279
RDW: 18.4 — ABNORMAL HIGH

## 2010-12-24 LAB — BASIC METABOLIC PANEL
BUN: 8
Chloride: 106
Glucose, Bld: 105 — ABNORMAL HIGH
Potassium: 3.9

## 2010-12-24 LAB — CROSSMATCH
Antibody Screen: POSITIVE
Donor AG Type: NEGATIVE
PT AG Type: NEGATIVE

## 2010-12-24 LAB — CBC
HCT: 35.3 — ABNORMAL LOW
MCV: 74.9 — ABNORMAL LOW
Platelets: 271
RDW: 20.1 — ABNORMAL HIGH

## 2010-12-24 LAB — HEMOGLOBIN AND HEMATOCRIT, BLOOD: Hemoglobin: 8.9 — ABNORMAL LOW

## 2010-12-31 ENCOUNTER — Ambulatory Visit: Payer: Medicare Other | Admitting: Gastroenterology

## 2010-12-31 ENCOUNTER — Telehealth: Payer: Self-pay | Admitting: Gastroenterology

## 2010-12-31 NOTE — Telephone Encounter (Signed)
Pt was a no show

## 2011-01-05 ENCOUNTER — Encounter: Payer: Self-pay | Admitting: Cardiology

## 2011-01-07 ENCOUNTER — Encounter: Payer: Self-pay | Admitting: Cardiology

## 2011-01-07 ENCOUNTER — Ambulatory Visit (INDEPENDENT_AMBULATORY_CARE_PROVIDER_SITE_OTHER): Payer: BC Managed Care – PPO | Admitting: Cardiology

## 2011-01-07 VITALS — BP 125/69 | HR 68 | Resp 16 | Ht 65.0 in | Wt 196.0 lb

## 2011-01-07 DIAGNOSIS — I2510 Atherosclerotic heart disease of native coronary artery without angina pectoris: Secondary | ICD-10-CM

## 2011-01-07 DIAGNOSIS — Q279 Congenital malformation of peripheral vascular system, unspecified: Secondary | ICD-10-CM

## 2011-01-07 DIAGNOSIS — F17200 Nicotine dependence, unspecified, uncomplicated: Secondary | ICD-10-CM

## 2011-01-07 DIAGNOSIS — I10 Essential (primary) hypertension: Secondary | ICD-10-CM

## 2011-01-07 DIAGNOSIS — E782 Mixed hyperlipidemia: Secondary | ICD-10-CM

## 2011-01-07 NOTE — Progress Notes (Signed)
Clinical Summary Donald Gaines is a 70 y.o.male presenting for followup. He was seen back in June. He is here with his wife, reports no progressive angina or dyspnea over baseline.  He indicates a fall recently, accidentally  tripped over a chair leg. No palpitations or syncope.  He reports compliance with his medications. His wife helps him keep track of things as he says his memory is "not that good."  We discussed followup lipids.  He has just started using an electronic cigarette to try and help stop smoking.   Allergies  Allergen Reactions  . Penicillins     Medication list reviewed.  Past Medical History  Diagnosis Date  . TIA (transient ischemic attack)     2005  . GERD (gastroesophageal reflux disease)   . Coronary atherosclerosis of native coronary artery     Multivessel, BMS SVG TO RCA 2000, reportedly  "2" additional stents in interim  . Hyperlipidemia   . Hypertension   . Bilateral carotid artery stenosis   . Depression   . Diabetes mellitus, type 2   . Restrictive lung disease   . Candida esophagitis   . Hemorrhoids   . Arteriovenous malformation small bowel     Capsule study 3/10  . GI bleeding     Cecal ulcers, arteriovenous malformations  . Chronic diarrhea SEP 2011    ?ETIOLOGY-DIABETIC ENTEROPATHY, LACTOSE INTOLERANCE,  OR IBS-MIXED  . Bloating AUG 2009    NL HBT FOR SIBO  . Anemia FEB 2008 FEDA 2o to AVMs, & large colon ulcers    HB 10.7 MCV 74.3    Past Surgical History  Procedure Date  . Esophagogastroduodenoscopy SEP 09    DUODENAL LIPOMA  . Capsule endo 03/10  . Right inguinal hernia repair   . Coronary artery bypass graft 1994  . Hydrogen breath test 2009  . Colonoscopy APR 2008    SIMPLE ADENOMA, [place] TICS, IH  . Colonoscopy FEB 2008 ANEMIA. MELENA     2 LARGE ILEOCEAL ULCERS 2o to ASA, [place] TICS, IH  . Colonoscopy SEP 2009 TRANSFUSION DEP ANEMIA    AC AVM-ABLATED, [place] TICS, IH  . Upper gastrointestinal endoscopy SEP 2009    GASTRIC AVM  ABLATED, NL DUO Bx    No family history on file.  Social History Donald Gaines reports that he has been smoking Cigarettes.  He has been smoking about 1 pack per day. He has never used smokeless tobacco. Donald Gaines reports that he does not drink alcohol.  Review of Systems No reported melena or hematochezia. Otherwise as outlined above.  Physical Examination Filed Vitals:   01/07/11 1015  BP: 125/69  Pulse: 68  Resp: 16    Obese male no acute distress.  HEENT: Conjunctiva and lids normal, oropharynx with poor dentition.  Neck: Supple, no elevated JVP or bruits, or thyromegaly.  Lungs: Clear to auscultation, nonlabored.  Cardiac: Regular rate and rhythm, indistinct PMI, soft basal systolic murmur, no S3 gallop.  Abdomen: Protuberant, obese, bowel sounds present, no tenderness.  Skin: Warm and dry, scattered tattoos noted.  Extremities: Trace ankle edema, distal pulses one plus.  Musculoskeletal: No kyphosis.  Neuropsychiatric: Alert and oriented x3, affect appropriate.    Problem List and Plan

## 2011-01-07 NOTE — Patient Instructions (Signed)
**Note De-Identified  Obfuscation** Your physician recommends that you return for lab work in: this week  Your physician recommends that you continue on your current medications as directed. Please refer to the Current Medication list given to you today.  Your physician recommends that you schedule a follow-up appointment in: 6 months

## 2011-01-07 NOTE — Assessment & Plan Note (Signed)
We continue to discuss smoking cessation strategies. He has not been able to quit.

## 2011-01-07 NOTE — Assessment & Plan Note (Signed)
History of iron deficiency anemia and recurrent GI bleeding. He has been able to tolerate his current antiplatelet regimen. This diagnosis may represent a significant barrier to future percutaneous interventions and more aggressive dual antiplatelet or anticoagulant therapy.

## 2011-01-07 NOTE — Assessment & Plan Note (Signed)
Followup fasting lipid profile and liver function tests will be obtained.

## 2011-01-07 NOTE — Assessment & Plan Note (Signed)
Symptomatically stable on medical therapy. No change in pattern or intensity of angina or baseline shortness of breath. Continue observation for now.

## 2011-01-07 NOTE — Assessment & Plan Note (Signed)
Blood pressure is well-controlled today. 

## 2011-01-09 LAB — HEPATIC FUNCTION PANEL
Albumin: 4.5 g/dL (ref 3.5–5.2)
Total Protein: 6.8 g/dL (ref 6.0–8.3)

## 2011-01-09 LAB — LIPID PANEL
HDL: 37 mg/dL — ABNORMAL LOW (ref >39)
LDL Cholesterol: 50 mg/dL (ref 0–99)
Triglycerides: 167 mg/dL — ABNORMAL HIGH (ref <150)
VLDL: 33 mg/dL (ref 0–40)

## 2011-01-12 ENCOUNTER — Telehealth: Payer: Self-pay | Admitting: Cardiology

## 2011-01-12 NOTE — Telephone Encounter (Signed)
Pt wife is calling to let us know that she has already taken patient to get labs done. Patient was told to get it done a week before seeing Dr De Hollingshead, that is not till 04/2011. She has been having a lot going on and wants to know if this is going to be okay or is she going to have to take him again.

## 2011-01-16 ENCOUNTER — Telehealth: Payer: Self-pay | Admitting: Gastroenterology

## 2011-01-16 NOTE — Telephone Encounter (Signed)
Pt's wife called for pt to let us know that the medicine he is taking (doesn't know the name) is causing him to be constipated with discomfort and was asking if something else could be called in for him.  Please return call to [phone]

## 2011-01-16 NOTE — Telephone Encounter (Signed)
Spoke with pt's wife. He is still having problems with diarrhea. Got constipated once after taking a dicyclomine and never took any more. Scheduled an OV on Mon 01/19/2011 with Vickey Huger, NP.

## 2011-01-19 ENCOUNTER — Ambulatory Visit (INDEPENDENT_AMBULATORY_CARE_PROVIDER_SITE_OTHER): Payer: BC Managed Care – PPO | Admitting: Urgent Care

## 2011-01-19 ENCOUNTER — Encounter: Payer: Self-pay | Admitting: Urgent Care

## 2011-01-19 DIAGNOSIS — K219 Gastro-esophageal reflux disease without esophagitis: Secondary | ICD-10-CM

## 2011-01-19 DIAGNOSIS — D126 Benign neoplasm of colon, unspecified: Secondary | ICD-10-CM

## 2011-01-19 DIAGNOSIS — R197 Diarrhea, unspecified: Secondary | ICD-10-CM

## 2011-01-19 DIAGNOSIS — K589 Irritable bowel syndrome without diarrhea: Secondary | ICD-10-CM

## 2011-01-19 DIAGNOSIS — Q279 Congenital malformation of peripheral vascular system, unspecified: Secondary | ICD-10-CM

## 2011-01-19 DIAGNOSIS — D509 Iron deficiency anemia, unspecified: Secondary | ICD-10-CM

## 2011-01-19 NOTE — Progress Notes (Signed)
Referring Provider: Wende Neighbors, MD Primary Care Physician:  Wende Neighbors, MD Primary Gastroenterologist:  Dr. Oneida Alar  Chief Complaint  Patient presents with  . Diarrhea   HPI:  Donald Gaines is a 70 y.o. male here for follow up for diarrhea, iron deficiency anemia, history of cecal AVM and small bowel ulcers/AVMs.  C/o diarrhea.  Wears depends all the time.  C/o severe fatigue & weakness.  Having problems w/ memory.  2-3 BMs awaken from sleeping and 2 or so BM per day.  Denies rectal bleeding or melena.  Intermittent sharp pains across bilat lower abd.  Took 1 docyclomine & caused 4 days of constipation.  So he has not tried any more anti-spasmodics. He also gives history of glaucoma. Denies nausea, vomiting.  Rare heartburn & indigestion.  Takes prilosec 20mg  BID.  Takes Tums before meals at times.  Worse in evenings.    Past Medical History  Diagnosis Date  . TIA (transient ischemic attack)     2005  . GERD (gastroesophageal reflux disease)   . Coronary atherosclerosis of native coronary artery     Multivessel, BMS SVG TO RCA 2000, reportedly  "2" additional stents in interim  . Hyperlipidemia   . Hypertension   . Bilateral carotid artery stenosis   . Depression   . Diabetes mellitus, type 2   . Restrictive lung disease   . Candida esophagitis   . Hemorrhoids   . Arteriovenous malformation small bowel     Capsule study 3/10  . GI bleeding     Cecal ulcers, arteriovenous malformations  . Chronic diarrhea SEP 2011    ?ETIOLOGY-DIABETIC ENTEROPATHY, LACTOSE INTOLERANCE,  OR IBS-MIXED  . Bloating AUG 2009    NL HBT FOR SIBO  . Anemia FEB 2008 FEDA 2o to AVMs, & large colon ulcers    HB 10.7 MCV 74.3; Dr Tressie Stalker  . BPH (benign prostatic hypertrophy)     Dr Maryland Pink  . Glaucoma   . Cataract    Past Surgical History  Procedure Date  . Esophagogastroduodenoscopy SEP 09    DUODENAL LIPOMA  . Capsule endo 03/10  . Right inguinal hernia repair   . Coronary artery bypass graft  1994  . Hydrogen breath test 2009  . Colonoscopy APR 2008    SIMPLE ADENOMA, [place] TICS, IH  . Colonoscopy FEB 2008 ANEMIA. MELENA     2 LARGE ILEOCEAL ULCERS 2o to ASA, [place] TICS, IH  . Colonoscopy SEP 2009 TRANSFUSION DEP ANEMIA    AC AVM-ABLATED, [place] TICS, IH  . Upper gastrointestinal endoscopy SEP 2009    GASTRIC AVM ABLATED, NL DUO Bx   Current Outpatient Prescriptions  Medication Sig Dispense Refill  . ADVAIR DISKUS 250-50 MCG/DOSE AEPB 1 puff.       . Albuterol Sulfate (PROAIR HFA IN) Inhale into the lungs.        . ALPRAZolam (XANAX) 0.5 MG tablet Take 0.5 mg by mouth at bedtime as needed.        . carvedilol (COREG) 6.25 MG tablet Take 6.25 mg by mouth 2 (two) times daily with a meal.        . cetirizine (ZYRTEC) 10 MG tablet Take 10 mg by mouth daily.        . clopidogrel (PLAVIX) 75 MG tablet Take 1 tablet (75 mg total) by mouth daily.  30 tablet  6  . dutasteride (AVODART) 0.5 MG capsule Take 0.5 mg by mouth daily.       Marland Kitchen Fesoterodine Fumarate (TOVIAZ)  8 MG TB24 Take 8 mg by mouth.        Marland Kitchen FLUoxetine (PROZAC) 20 MG capsule Take 20 mg by mouth 2 (two) times daily.       . fluticasone (FLONASE) 50 MCG/ACT nasal spray as needed.       . furosemide (LASIX) 40 MG tablet Take 40 mg by mouth 2 (two) times daily.       Marland Kitchen glimepiride (AMARYL) 2 MG tablet Take 2 mg by mouth daily before breakfast.        . HYDROcodone-acetaminophen (VICODIN) 5-500 MG per tablet Take 1 tablet by mouth every 6 (six) hours as needed.       . isosorbide mononitrate (IMDUR) 60 MG 24 hr tablet Take 1 tablet (60 mg total) by mouth daily.  30 tablet  6  . losartan (COZAAR) 50 MG tablet Take 50 mg by mouth daily.       Marland Kitchen NITROSTAT 0.4 MG SL tablet Place under the tongue as needed.       Marland Kitchen omeprazole (PRILOSEC) 20 MG capsule Take 20 mg by mouth 2 (two) times daily. 1 30 MIN BEFORE MEALS TWICE DAILY      . rosuvastatin (CRESTOR) 20 MG tablet Take 1 tablet (20 mg total) by mouth daily.  30 tablet  6  .  sitaGLIPtan-metformin (JANUMET) 50-1000 MG per tablet Take 1 tablet by mouth 2 (two) times daily with a meal.        . Tamsulosin HCl (FLOMAX) 0.4 MG CAPS Take 0.4 mg by mouth daily. Takes in AM       Allergies as of 01/19/2011 - Review Complete 01/19/2011  Allergen Reaction Noted  . Penicillins  05/25/2008   Review of Systems: Gen: see HPI CV: Denies chest pain, angina, palpitations, syncope, orthopnea, PND, peripheral edema, and claudication. Resp: Denies dyspnea at rest, dyspnea with exercise, cough, sputum, wheezing, coughing up blood, and pleurisy. GI: Denies vomiting blood, jaundice.  Derm: Denies rash, itching, dry skin, hives, moles, warts, or unhealing ulcers.  Psych: Denies depression, anxiety, memory loss, suicidal ideation, hallucinations, paranoia, and confusion. Heme: Denies bruising and enlarged lymph nodes.  Physical Exam: BP 117/66  Pulse 65  Temp(Src) 97.6 F (36.4 C) (Temporal)  Ht 5\' 5"  (1.651 m)  Wt 195 lb 12.8 oz (88.814 kg)  BMI 32.58 kg/m2 General:   Alert,  Well-developed, obese, pleasant and cooperative in NAD. Accompanied by his wife. Head:  Normocephalic and atraumatic. Eyes:  Sclera clear, no icterus.   Conjunctiva pink. Mouth:  No deformity or lesions, oropharynx pink and moist. Neck:  Supple; no masses or thyromegaly. Heart:  Regular rate and rhythm; no murmurs, clicks, rubs,  or gallops. Abdomen:  Soft, protuberant, nontender and nondistended. No masses, hepatosplenomegaly or hernias noted. Normal bowel sounds, without guarding, and without rebound.   Msk:  Symmetrical without gross deformities. Normal posture. Pulses:  Normal pulses noted. Extremities:  With clubbing.  Trace pretibial edema bilaterally. Neurologic:  Alert and  oriented x4;  grossly normal neurologically. Skin:  Intact without significant lesions or rashes. Cervical Nodes:  No significant cervical adenopathy. Psych:  Alert and cooperative. Normal mood and affect.

## 2011-01-19 NOTE — Patient Instructions (Signed)
Imodium 2mg  every morning before you get out of bed ALIGN 1 daily Go get your labs draw & we will call w/ results

## 2011-01-20 ENCOUNTER — Encounter: Payer: Self-pay | Admitting: Urgent Care

## 2011-01-20 LAB — CBC WITH DIFFERENTIAL/PLATELET
Basophils Absolute: 0 10*3/uL (ref 0.0–0.1)
Basophils Relative: 0 % (ref 0–1)
Eosinophils Relative: 1 % (ref 0–5)
Lymphocytes Relative: 15 % (ref 12–46)
MCV: 97.1 fL (ref 78.0–100.0)
Neutro Abs: 5.5 10*3/uL (ref 1.7–7.7)
Platelets: 204 10*3/uL (ref 150–400)
RDW: 13.3 % (ref 11.5–15.5)
WBC: 7.3 10*3/uL (ref 4.0–10.5)

## 2011-01-20 LAB — COMPREHENSIVE METABOLIC PANEL
AST: 13 U/L (ref 0–37)
Albumin: 4.5 g/dL (ref 3.5–5.2)
Alkaline Phosphatase: 101 U/L (ref 39–117)
BUN: 12 mg/dL (ref 6–23)
Creat: 1 mg/dL (ref 0.50–1.35)
Potassium: 4.2 mEq/L (ref 3.5–5.3)
Total Bilirubin: 0.6 mg/dL (ref 0.3–1.2)

## 2011-01-20 LAB — IGA: IgA: 144 mg/dL (ref 68–379)

## 2011-01-20 NOTE — Progress Notes (Signed)
Chronic diarrhea SINCE SEP 2011-?ETIOLOGY-DIABETIC ENTEROPATHY, LACTOSE INTOLERANCE, OR IBS-MIXED. PT HAD NL SB bX, & HBT. REVIEWED.

## 2011-01-20 NOTE — Assessment & Plan Note (Addendum)
Chronic diarrhea.  Unable to tolerate anti-spasmodic. Known small bowel ulcers, AVMs, & cecal AVM in setting of previous NSAIDs. He also has iron deficiency anemia and is currently on Plavix.  Possibility of underlying Crohn's disease & celiac disease remain in the differential as well.    Imodium 2mg  every morning before you get out of bed ALIGN 1 daily TTG IgA, IgA, CMP

## 2011-01-20 NOTE — Assessment & Plan Note (Signed)
Controlled on PPI °

## 2011-01-20 NOTE — Assessment & Plan Note (Signed)
Check CBC and ferritin 

## 2011-01-20 NOTE — Progress Notes (Signed)
Cc to PCP 

## 2011-01-21 NOTE — Progress Notes (Signed)
Quick Note:  Pt informed ______ 

## 2011-01-21 NOTE — Progress Notes (Signed)
Quick Note:  Please call patient his blood count is stable and there is no evidence of celiac disease on his lab work. Trial of Imodium each morning and probiotic. If no relief would consider Questran. Office visit with Dr. Oneida Alar only in 6 weeks ______

## 2011-01-23 ENCOUNTER — Encounter: Payer: Self-pay | Admitting: Gastroenterology

## 2011-01-27 ENCOUNTER — Ambulatory Visit (HOSPITAL_COMMUNITY): Payer: BC Managed Care – PPO

## 2011-01-31 ENCOUNTER — Other Ambulatory Visit (HOSPITAL_COMMUNITY): Payer: Self-pay | Admitting: Oncology

## 2011-02-02 ENCOUNTER — Other Ambulatory Visit (HOSPITAL_COMMUNITY): Payer: Self-pay | Admitting: Oncology

## 2011-02-03 ENCOUNTER — Encounter (HOSPITAL_COMMUNITY): Payer: Medicare Other | Attending: Oncology

## 2011-02-03 DIAGNOSIS — D509 Iron deficiency anemia, unspecified: Secondary | ICD-10-CM | POA: Insufficient documentation

## 2011-02-03 DIAGNOSIS — D638 Anemia in other chronic diseases classified elsewhere: Secondary | ICD-10-CM | POA: Insufficient documentation

## 2011-02-03 LAB — COMPREHENSIVE METABOLIC PANEL
Albumin: 3.8 g/dL (ref 3.5–5.2)
BUN: 9 mg/dL (ref 6–23)
Creatinine, Ser: 0.97 mg/dL (ref 0.50–1.35)
GFR calc Af Amer: >90 mL/min (ref >90)
Glucose, Bld: 96 mg/dL (ref 70–99)
Total Protein: 7.3 g/dL (ref 6.0–8.3)

## 2011-02-03 LAB — DIFFERENTIAL
Basophils Relative: 1 % (ref 0–1)
Eosinophils Absolute: 0.1 10*3/uL (ref 0.0–0.7)
Eosinophils Relative: 1 % (ref 0–5)
Lymphs Abs: 1.1 10*3/uL (ref 0.7–4.0)
Monocytes Absolute: 0.4 10*3/uL (ref 0.1–1.0)
Monocytes Relative: 7 % (ref 3–12)

## 2011-02-03 LAB — CBC
HCT: 36.7 % — ABNORMAL LOW (ref 39.0–52.0)
Hemoglobin: 12.1 g/dL — ABNORMAL LOW (ref 13.0–17.0)
MCH: 31.9 pg (ref 26.0–34.0)
MCHC: 33 g/dL (ref 30.0–36.0)
MCV: 96.8 fL (ref 78.0–100.0)

## 2011-02-03 LAB — FERRITIN: Ferritin: 397 ng/mL — ABNORMAL HIGH (ref 22–322)

## 2011-02-03 NOTE — Progress Notes (Signed)
Labs drawn today for cmp,cbc/diff,ferritin

## 2011-02-04 ENCOUNTER — Other Ambulatory Visit (HOSPITAL_COMMUNITY): Payer: Self-pay | Admitting: Oncology

## 2011-02-04 DIAGNOSIS — D509 Iron deficiency anemia, unspecified: Secondary | ICD-10-CM

## 2011-02-05 ENCOUNTER — Other Ambulatory Visit (HOSPITAL_COMMUNITY): Payer: BC Managed Care – PPO

## 2011-02-06 ENCOUNTER — Ambulatory Visit (HOSPITAL_COMMUNITY): Payer: BC Managed Care – PPO

## 2011-02-13 ENCOUNTER — Other Ambulatory Visit: Payer: Self-pay | Admitting: *Deleted

## 2011-02-13 MED ORDER — CLOPIDOGREL BISULFATE 75 MG PO TABS: 75.0000 mg | ORAL_TABLET | Freq: Every day | ORAL | Status: DC

## 2011-02-13 MED ORDER — ROSUVASTATIN CALCIUM 20 MG PO TABS: 20.0000 mg | ORAL_TABLET | Freq: Every day | ORAL | Status: DC

## 2011-03-05 ENCOUNTER — Encounter: Payer: Self-pay | Admitting: Gastroenterology

## 2011-03-05 ENCOUNTER — Ambulatory Visit (INDEPENDENT_AMBULATORY_CARE_PROVIDER_SITE_OTHER): Payer: Medicare Other | Admitting: Gastroenterology

## 2011-03-05 DIAGNOSIS — Q279 Congenital malformation of peripheral vascular system, unspecified: Secondary | ICD-10-CM

## 2011-03-05 DIAGNOSIS — R197 Diarrhea, unspecified: Secondary | ICD-10-CM

## 2011-03-05 NOTE — Patient Instructions (Signed)
CONTINUE IMODIUM. CONTINUE ALIGN OR [employer] PROBIOTIC EVERY DAY. FOLLOW UP IN 4 MOS.

## 2011-03-05 NOTE — Assessment & Plan Note (Signed)
MOST LIKELY 2O TO DIABETIC ENTEROPATHY & IBS. Sx improved with imodium and align.  CONTINUE IMODIUM. CONTINUE ALIGN OR [employer] PROBIOTIC EVERY DAY. FOLLOW UP IN 4 MOS.

## 2011-03-05 NOTE — Assessment & Plan Note (Signed)
Hb stable at [place] 2012. NO ACTIVE GIB. OPV IN 4 MOS. WILL REVIEW LABS AT THAT TIME.

## 2011-03-05 NOTE — Progress Notes (Signed)
Cc to PCP 

## 2011-03-05 NOTE — Progress Notes (Signed)
Subjective:    Patient ID: Donald Gaines, male    DOB: 1941-02-03, 70 y.o.   MRN: [id number]  PCP: [facility] 1o CARDS: DR. MCDOWELL  HPI TAKING IRON IV. NO INFUSION FOR AT LEAST 6 MOS. NO RECTAL BLEEDING. Sx improved with Imodium and ALign prior to getting out of bed in the AM. Needs some Align. Bms: sometimes twice. Appetite: doesn't care if he doesn't eat. EATING WEIGHT WATCHER BECAUSE WIFE IS ON THE PLAN. LOST 7 LBS [place].  Past Medical History  Diagnosis Date  . TIA (transient ischemic attack)     2005  . GERD (gastroesophageal reflux disease)   . Coronary atherosclerosis of native coronary artery     Multivessel, BMS SVG TO RCA 2000, reportedly  "2" additional stents in interim  . Hyperlipidemia   . Hypertension   . Bilateral carotid artery stenosis   . Depression   . Diabetes mellitus, type 2   . Restrictive lung disease   . Candida esophagitis   . Hemorrhoids   . Arteriovenous malformation small bowel     Capsule study 3/10  . GI bleeding     Cecal ulcers, arteriovenous malformations  . Chronic diarrhea SEP 2011    ?ETIOLOGY-DIABETIC ENTEROPATHY, LACTOSE INTOLERANCE,  OR IBS-MIXED  . Bloating AUG 2009    NL HBT FOR SIBO  . Anemia FEB 2008 FEDA 2o to AVMs, & large colon ulcers    HB 10.7 MCV 74.3; Dr Tressie Stalker  . BPH (benign prostatic hypertrophy)     Dr [place] Pink  . Glaucoma   . Cataract     Past Surgical History  Procedure Date  . Esophagogastroduodenoscopy SEP 09    DUODENAL LIPOMA  . Capsule endo 03/10  . Right inguinal hernia repair   . Coronary artery bypass graft 1994  . Hydrogen breath test 2009  . Colonoscopy APR 2008    SIMPLE ADENOMA, [place] TICS, IH  . Colonoscopy FEB 2008 ANEMIA. MELENA     2 LARGE ILEOCEAL ULCERS 2o to ASA, [place] TICS, IH  . Colonoscopy SEP 2009 TRANSFUSION DEP ANEMIA    AC AVM-ABLATED, [place] TICS, IH  . Upper gastrointestinal endoscopy SEP 2009    GASTRIC AVM ABLATED, NL DUO Bx    Allergies  Allergen Reactions  .  Penicillins     Current Outpatient Prescriptions  Medication Sig Dispense Refill  . ADVAIR DISKUS 250-50 MCG/DOSE AEPB Inhale 1 puff into the lungs.       . Albuterol Sulfate (PROAIR HFA IN) Inhale into the lungs.        . ALPRAZolam (XANAX) 0.5 MG tablet Take 0.5 mg by mouth at bedtime as needed.        . carvedilol (COREG) 6.25 MG tablet Take 6.25 mg by mouth 2 (two) times daily with a meal.        . cetirizine (ZYRTEC) 10 MG tablet Take 10 mg by mouth daily.        Marland Kitchen dutasteride (AVODART) 0.5 MG capsule Take 0.5 mg by mouth daily.       Marland Kitchen Fesoterodine Fumarate (TOVIAZ) 8 MG TB24 Take 8 mg by mouth.        Marland Kitchen FLUoxetine (PROZAC) 20 MG capsule Take 20 mg by mouth 2 (two) times daily.       . fluticasone (FLONASE) 50 MCG/ACT nasal spray as needed.       . furosemide (LASIX) 40 MG tablet Take 40 mg by mouth 2 (two) times daily.       Marland Kitchen  glimepiride (AMARYL) 2 MG tablet Take 2 mg by mouth daily before breakfast.        . HYDROcodone-acetaminophen (VICODIN) 5-500 MG per tablet Take 1 tablet by mouth every 6 (six) hours as needed.       . isosorbide mononitrate (IMDUR) 60 MG 24 hr tablet Take 1 tablet (60 mg total) by mouth daily.    Marland Kitchen losartan (COZAAR) 50 MG tablet Take 50 mg by mouth daily.     Marland Kitchen NITROSTAT 0.4 MG SL tablet Place under the tongue as needed.     Marland Kitchen omeprazole (PRILOSEC) 20 MG capsule Take 20 mg by mouth 2 (two) times daily. 1 30 MIN BEFORE MEALS TWICE DAILY    . Probiotic Product (ALIGN) 4 MG CAPS Take 4 mg by mouth daily.      . rosuvastatin (CRESTOR) 20 MG tablet Take 1 tablet (20 mg total) by mouth daily.    . sitaGLIPtan-metformin (JANUMET) 50-1000 MG per tablet Take 1 tablet by mouth 2 (two) times daily with a meal.      . Tamsulosin HCl (FLOMAX) 0.4 MG CAPS Take 0.4 mg by mouth daily. Takes in AM    . clopidogrel (PLAVIX) 75 MG tablet Take 1 tablet (75 mg total) by mouth daily.     No family history on file.     Review of Systems     Objective:   Physical Exam    Vitals reviewed. Constitutional: He is oriented to person, place, and time. He appears well-developed and well-nourished. No distress.  HENT:  Head: Normocephalic and atraumatic.  Mouth/Throat: Oropharynx is clear and moist. No oropharyngeal exudate.  Eyes: No scleral icterus.  Neck: Normal range of motion. Neck supple.  Cardiovascular: Normal rate, regular rhythm and normal heart sounds.   Pulmonary/Chest: Effort normal and breath sounds normal. No respiratory distress.  Abdominal: Soft. Bowel sounds are normal. He exhibits no distension. There is no tenderness.  Musculoskeletal: He exhibits no edema.  Lymphadenopathy:    He has no cervical adenopathy.  Neurological: He is alert and oriented to person, place, and time.       NO FOCAL DEFICITS           Assessment & Plan:

## 2011-03-12 NOTE — Progress Notes (Signed)
Reminder in epic to follow up in 4 months °

## 2011-03-19 ENCOUNTER — Other Ambulatory Visit (HOSPITAL_COMMUNITY): Payer: BC Managed Care – PPO

## 2011-03-26 ENCOUNTER — Other Ambulatory Visit (HOSPITAL_COMMUNITY): Payer: Self-pay

## 2011-04-03 ENCOUNTER — Encounter: Payer: Self-pay | Admitting: Gastroenterology

## 2011-04-15 ENCOUNTER — Other Ambulatory Visit (HOSPITAL_COMMUNITY): Payer: Self-pay | Admitting: Oncology

## 2011-04-17 ENCOUNTER — Other Ambulatory Visit: Payer: Self-pay

## 2011-04-17 ENCOUNTER — Other Ambulatory Visit: Payer: Self-pay | Admitting: *Deleted

## 2011-04-17 MED ORDER — CLOPIDOGREL BISULFATE 75 MG PO TABS: 75.0000 mg | ORAL_TABLET | Freq: Every day | ORAL | Status: DC

## 2011-04-17 MED ORDER — ROSUVASTATIN CALCIUM 20 MG PO TABS: 20.0000 mg | ORAL_TABLET | Freq: Every day | ORAL | Status: DC

## 2011-04-21 ENCOUNTER — Other Ambulatory Visit: Payer: Self-pay | Admitting: *Deleted

## 2011-06-01 ENCOUNTER — Encounter (HOSPITAL_COMMUNITY): Payer: Self-pay | Admitting: Oncology

## 2011-06-01 ENCOUNTER — Encounter (HOSPITAL_COMMUNITY): Payer: Medicare Other | Attending: Oncology | Admitting: Oncology

## 2011-06-01 VITALS — BP 115/66 | HR 71 | Temp 97.6°F | Wt 192.9 lb

## 2011-06-01 DIAGNOSIS — F3289 Other specified depressive episodes: Secondary | ICD-10-CM

## 2011-06-01 DIAGNOSIS — I6529 Occlusion and stenosis of unspecified carotid artery: Secondary | ICD-10-CM | POA: Insufficient documentation

## 2011-06-01 DIAGNOSIS — F329 Major depressive disorder, single episode, unspecified: Secondary | ICD-10-CM

## 2011-06-01 DIAGNOSIS — D509 Iron deficiency anemia, unspecified: Secondary | ICD-10-CM

## 2011-06-01 DIAGNOSIS — K219 Gastro-esophageal reflux disease without esophagitis: Secondary | ICD-10-CM | POA: Insufficient documentation

## 2011-06-01 DIAGNOSIS — I10 Essential (primary) hypertension: Secondary | ICD-10-CM | POA: Insufficient documentation

## 2011-06-01 DIAGNOSIS — J449 Chronic obstructive pulmonary disease, unspecified: Secondary | ICD-10-CM

## 2011-06-01 DIAGNOSIS — F0390 Unspecified dementia without behavioral disturbance: Secondary | ICD-10-CM

## 2011-06-01 DIAGNOSIS — F17200 Nicotine dependence, unspecified, uncomplicated: Secondary | ICD-10-CM | POA: Insufficient documentation

## 2011-06-01 DIAGNOSIS — K7689 Other specified diseases of liver: Secondary | ICD-10-CM | POA: Insufficient documentation

## 2011-06-01 DIAGNOSIS — K5520 Angiodysplasia of colon without hemorrhage: Secondary | ICD-10-CM | POA: Insufficient documentation

## 2011-06-01 DIAGNOSIS — J4489 Other specified chronic obstructive pulmonary disease: Secondary | ICD-10-CM

## 2011-06-01 DIAGNOSIS — I658 Occlusion and stenosis of other precerebral arteries: Secondary | ICD-10-CM | POA: Insufficient documentation

## 2011-06-01 DIAGNOSIS — Z8673 Personal history of transient ischemic attack (TIA), and cerebral infarction without residual deficits: Secondary | ICD-10-CM | POA: Insufficient documentation

## 2011-06-01 LAB — CBC
HCT: 35.1 % — ABNORMAL LOW (ref 39.0–52.0)
Hemoglobin: 11.4 g/dL — ABNORMAL LOW (ref 13.0–17.0)
MCHC: 32.5 g/dL (ref 30.0–36.0)
RDW: 13.7 % (ref 11.5–15.5)
WBC: 7.3 10*3/uL (ref 4.0–10.5)

## 2011-06-01 LAB — FERRITIN: Ferritin: 145 ng/mL (ref 22–322)

## 2011-06-01 NOTE — Patient Instructions (Signed)
Donald Gaines  [id number] 08-19-1940   [facility]  Discharge Instructions  RECOMMENDATIONS MADE BY THE CONSULTANT AND ANY TEST RESULTS WILL BE SENT TO YOUR REFERRING DOCTOR.   EXAM FINDINGS BY MD TODAY AND SIGNS AND SYMPTOMS TO REPORT TO CLINIC OR PRIMARY MD: Will check some labs today to see what your blood counts are doing.  Ask your wife to call Mickie Kay, RN at [phone].  We need to know if you are still taking aricept.  MEDICATIONS PRESCRIBED: none   INSTRUCTIONS GIVEN AND DISCUSSED: Other:  SPECIAL INSTRUCTIONS/FOLLOW-UP: Lab work Needed today and Return to Clinic in 4 months to see PA.   I acknowledge that I have been informed and understand all the instructions given to me and received a copy. I do not have any more questions at this time, but understand that I may call the Specialty Clinic at [facility] at [phone] during business hours should I have any further questions or need assistance in obtaining follow-up care.    __________________________________________  _____________  __________ Signature of Patient or Authorized Representative            Date                   Time    __________________________________________ Nurse's Signature

## 2011-06-01 NOTE — Progress Notes (Signed)
This office note has been dictated.

## 2011-06-01 NOTE — Progress Notes (Signed)
CC:   Delphina Cahill, M.D. Barney Drain, M.D.  DIAGNOSES: 1. Severe iron deficiency anemia but with an excellent response to IV     Feraheme though we have not had to give him any since August. 2. Arteriovenous malformation of the small bowel and possibly large     bowel with ablation September 2009, still losing iron     intermittently. 3. Dementia. 4. Chronic depression, on Prozac 20 mg b.i.d. and p.r.n. Xanax. 5. Chronic obstructive pulmonary disease, still smoking. 6. Hypertension. 7. Fatty liver. 8. Bilateral carotid artery stenosis. 9. History of stroke in the past. 10.Depression as a young man with suicidal tendencies years ago. 11.Gastroesophageal reflux disease. 12.Alcoholism in the past, quitting years ago. 13.Cocaine use in the past, quitting years ago. 14.Marijuana use in the past, quitting years ago. 15.Right orchiectomy secondary to trauma and he has been him impotent     since 2001.  His labs are pending from today, but they have been very, very good. His most recent hemoglobin in November was 12.1 g, white count and platelets were fine.  Ferritin was 397.  Liver enzymes were intact.  BUN and creatinine were intact.  He just does not feel good and he continues to smoke which I have discouraged.  He also cannot remember whether he has taken the Aricept that he has at home.  So we will call his wife and see if that is indeed happening.  I certainly wish he would quit smoking but I am not sure he is going to.  He tried the electronic cigarette he states in the past but started coughing and never "coughed" when he smoked real cigarettes so he went back to real cigarettes.  I have tried to explain to him about the association between small vessel disease, strokes and smoking and I am not sure he has bought in to quitting per se.  His vital signs are stable.  He does not look any different but he will continue to come back and see [place] on a regular basis.  I will see him  one way or the other in about 4 months, sooner if need be.    ______________________________ Gaston Islam. Tressie Stalker, MD ESN/MEDQ  D:  06/01/2011  T:  06/01/2011  Job:  [id number]

## 2011-06-01 NOTE — Progress Notes (Signed)
Spoke with Mrs. Donald Gaines who stated that one of Donald Gaines physicians stopped his aricept some time ago.  She is not sure who or why.

## 2011-06-16 NOTE — Progress Notes (Signed)
REVIEWED.  

## 2011-06-16 NOTE — Progress Notes (Signed)
Mar 2013 hb 11.4 ferritin 145

## 2011-07-08 ENCOUNTER — Encounter: Payer: Self-pay | Admitting: Cardiology

## 2011-07-08 ENCOUNTER — Ambulatory Visit (INDEPENDENT_AMBULATORY_CARE_PROVIDER_SITE_OTHER): Payer: Medicare Other | Admitting: Cardiology

## 2011-07-08 VITALS — BP 119/65 | HR 65 | Resp 16 | Ht 65.0 in | Wt 193.0 lb

## 2011-07-08 DIAGNOSIS — I10 Essential (primary) hypertension: Secondary | ICD-10-CM

## 2011-07-08 DIAGNOSIS — F17200 Nicotine dependence, unspecified, uncomplicated: Secondary | ICD-10-CM

## 2011-07-08 DIAGNOSIS — E782 Mixed hyperlipidemia: Secondary | ICD-10-CM

## 2011-07-08 DIAGNOSIS — I2510 Atherosclerotic heart disease of native coronary artery without angina pectoris: Secondary | ICD-10-CM

## 2011-07-08 NOTE — Assessment & Plan Note (Signed)
Blood pressure well controlled today. No changes made. 

## 2011-07-08 NOTE — Progress Notes (Signed)
Clinical Summary Donald Gaines is a medically complex 71 y.o.male presenting for followup. He was seen in October 2012. He is here with his wife.  Lab work from March reviewed finding hemoglobin 11.4, platelets 198. Continues to see Dr. Tressie Stalker follow iron deficiency anemia.  He reports two episodes of angina since last visit that required NTG. Still on Plavix, but not ASA.  Denies any melena or hematochezia.  Allergies  Allergen Reactions  . Penicillins     Current Outpatient Prescriptions  Medication Sig Dispense Refill  . ADVAIR DISKUS 250-50 MCG/DOSE AEPB Inhale 1 puff into the lungs 2 (two) times daily.       . Albuterol Sulfate (PROAIR HFA IN) Inhale 2 puffs into the lungs 3 (three) times daily.       Marland Kitchen ALPRAZolam (XANAX) 0.5 MG tablet Take 0.5 mg by mouth at bedtime as needed.        . carvedilol (COREG) 6.25 MG tablet Take 6.25 mg by mouth 2 (two) times daily with a meal.        . cetirizine (ZYRTEC) 10 MG tablet Take 10 mg by mouth daily.        . clopidogrel (PLAVIX) 75 MG tablet Take 1 tablet (75 mg total) by mouth daily.  30 tablet  8  . dutasteride (AVODART) 0.5 MG capsule Take 0.5 mg by mouth daily.       Marland Kitchen Fesoterodine Fumarate (TOVIAZ) 8 MG TB24 Take 8 mg by mouth daily.       Marland Kitchen FLUoxetine (PROZAC) 20 MG capsule TAKE 1 CAPSULE TWICE A DAY  60 capsule  3  . fluticasone (FLONASE) 50 MCG/ACT nasal spray as needed.       . furosemide (LASIX) 40 MG tablet Take 40 mg by mouth daily.       Marland Kitchen glimepiride (AMARYL) 2 MG tablet Take 2 mg by mouth daily before breakfast.        . HYDROcodone-acetaminophen (VICODIN) 5-500 MG per tablet Take 1 tablet by mouth every 6 (six) hours as needed.       . isosorbide mononitrate (IMDUR) 60 MG 24 hr tablet Take 1 tablet (60 mg total) by mouth daily.  30 tablet  6  . losartan (COZAAR) 50 MG tablet Take 50 mg by mouth daily.       Marland Kitchen NITROSTAT 0.4 MG SL tablet Place under the tongue as needed.       Marland Kitchen omeprazole (PRILOSEC) 20 MG capsule  Take 20 mg by mouth 2 (two) times daily. 1 30 MIN BEFORE MEALS TWICE DAILY      . Probiotic Product (ALIGN) 4 MG CAPS Take 4 mg by mouth daily.        . rosuvastatin (CRESTOR) 20 MG tablet Take 1 tablet (20 mg total) by mouth daily.  30 tablet  8  . sitaGLIPtan-metformin (JANUMET) 50-1000 MG per tablet Take 1 tablet by mouth 2 (two) times daily with a meal.        . Tamsulosin HCl (FLOMAX) 0.4 MG CAPS Take 0.4 mg by mouth daily. Takes in AM        Past Medical History  Diagnosis Date  . TIA (transient ischemic attack)     2005  . GERD (gastroesophageal reflux disease)   . Coronary atherosclerosis of native coronary artery     Multivessel, BMS SVG TO RCA 2000, reportedly  "2" additional stents in interim  . Hyperlipidemia   . Hypertension   . Bilateral carotid artery stenosis   .  Depression   . Diabetes mellitus, type 2   . Restrictive lung disease   . Candida esophagitis   . Hemorrhoids   . Arteriovenous malformation small bowel     Capsule study 3/10  . GI bleeding     Cecal ulcers, arteriovenous malformations  . Chronic diarrhea SEP 2011    ?ETIOLOGY-DIABETIC ENTEROPATHY, LACTOSE INTOLERANCE,  OR IBS-MIXED  . Bloating AUG 2009    NL HBT FOR SIBO  . Anemia FEB 2008 FEDA 2o to AVMs, & large colon ulcers    HB 10.7 MCV 74.3; Dr Tressie Stalker  . BPH (benign prostatic hypertrophy)     Dr Maryland Pink  . Glaucoma   . Cataract     Social History Mr. Witherell reports that he has been smoking Cigarettes.  He has been smoking about 1 pack per day. He has never used smokeless tobacco. Mr. Seever reports that he does not drink alcohol.  Review of Systems No palpitations, orthopnea, PND. No cough. Very iinactive. Appetite stable. Otherwise negative.  Physical Examination Filed Vitals:   07/08/11 1302  BP: 119/65  Pulse: 65  Resp: 16    Obese male no acute distress.  HEENT: Conjunctiva and lids normal, oropharynx with poor dentition.  Neck: Supple, no elevated JVP or bruits, or  thyromegaly.  Lungs: Clear to auscultation, nonlabored.  Cardiac: Regular rate and rhythm, indistinct PMI, soft basal systolic murmur, no S3 gallop.  Abdomen: Protuberant, obese, bowel sounds present, no tenderness.  Skin: Warm and dry, scattered tattoos noted.  Extremities: Trace ankle edema, distal pulses one plus.  Musculoskeletal: No kyphosis.  Neuropsychiatric: Alert and oriented x3, affect appropriate.   ECG Sinus rhythm with anterolateral T wave inversions, more prominent since last tracing.   Problem List and Plan

## 2011-07-08 NOTE — Patient Instructions (Signed)
**Note De-identified  Obfuscation** Your physician recommends that you continue on your current medications as directed. Please refer to the Current Medication list given to you today.  Your physician recommends that you schedule a follow-up appointment in: 6 months  

## 2011-07-08 NOTE — Assessment & Plan Note (Signed)
He has not been able to quit smoking.

## 2011-07-08 NOTE — Assessment & Plan Note (Signed)
Continues on statin therapy. 

## 2011-07-08 NOTE — Assessment & Plan Note (Signed)
Relatively stable on medical therapy. ECG with progression in T wave inversion since last tacing, but only two episodes on angina reported. His history of GIB and anemia secondary to AVMs presents significant barrier to revscularization options - not certain if he would be able to adequately maintain DAPT. For now continue observation. Followup arranged.

## 2011-08-07 ENCOUNTER — Other Ambulatory Visit: Payer: Self-pay | Admitting: Cardiology

## 2011-08-07 MED ORDER — ISOSORBIDE MONONITRATE ER 60 MG PO TB24: 60.0000 mg | ORAL_TABLET | Freq: Every day | ORAL | Status: DC

## 2011-08-31 ENCOUNTER — Other Ambulatory Visit: Payer: Self-pay | Admitting: Cardiology

## 2011-09-14 ENCOUNTER — Other Ambulatory Visit: Payer: Self-pay | Admitting: Cardiology

## 2011-09-14 MED ORDER — ISOSORBIDE MONONITRATE ER 60 MG PO TB24: 60.0000 mg | ORAL_TABLET | Freq: Every day | ORAL | Status: DC

## 2011-10-01 ENCOUNTER — Encounter (HOSPITAL_COMMUNITY): Payer: Medicare Other | Attending: Oncology | Admitting: Oncology

## 2011-10-01 VITALS — BP 107/58 | HR 64 | Temp 98.1°F | Ht 65.0 in | Wt 192.0 lb

## 2011-10-01 DIAGNOSIS — J449 Chronic obstructive pulmonary disease, unspecified: Secondary | ICD-10-CM

## 2011-10-01 DIAGNOSIS — K7689 Other specified diseases of liver: Secondary | ICD-10-CM | POA: Insufficient documentation

## 2011-10-01 DIAGNOSIS — I10 Essential (primary) hypertension: Secondary | ICD-10-CM | POA: Insufficient documentation

## 2011-10-01 DIAGNOSIS — J4489 Other specified chronic obstructive pulmonary disease: Secondary | ICD-10-CM | POA: Insufficient documentation

## 2011-10-01 DIAGNOSIS — Z8673 Personal history of transient ischemic attack (TIA), and cerebral infarction without residual deficits: Secondary | ICD-10-CM | POA: Insufficient documentation

## 2011-10-01 DIAGNOSIS — F329 Major depressive disorder, single episode, unspecified: Secondary | ICD-10-CM

## 2011-10-01 DIAGNOSIS — K219 Gastro-esophageal reflux disease without esophagitis: Secondary | ICD-10-CM | POA: Insufficient documentation

## 2011-10-01 DIAGNOSIS — K5520 Angiodysplasia of colon without hemorrhage: Secondary | ICD-10-CM | POA: Insufficient documentation

## 2011-10-01 DIAGNOSIS — F0390 Unspecified dementia without behavioral disturbance: Secondary | ICD-10-CM | POA: Insufficient documentation

## 2011-10-01 DIAGNOSIS — I6529 Occlusion and stenosis of unspecified carotid artery: Secondary | ICD-10-CM | POA: Insufficient documentation

## 2011-10-01 DIAGNOSIS — D509 Iron deficiency anemia, unspecified: Secondary | ICD-10-CM | POA: Insufficient documentation

## 2011-10-01 DIAGNOSIS — F3289 Other specified depressive episodes: Secondary | ICD-10-CM

## 2011-10-01 DIAGNOSIS — E119 Type 2 diabetes mellitus without complications: Secondary | ICD-10-CM | POA: Insufficient documentation

## 2011-10-01 LAB — DIFFERENTIAL
Basophils Absolute: 0 10*3/uL (ref 0.0–0.1)
Lymphocytes Relative: 13 % (ref 12–46)
Lymphs Abs: 0.9 10*3/uL (ref 0.7–4.0)
Monocytes Absolute: 0.6 10*3/uL (ref 0.1–1.0)
Monocytes Relative: 9 % (ref 3–12)
Neutro Abs: 5.4 10*3/uL (ref 1.7–7.7)

## 2011-10-01 LAB — CBC
HCT: 33.5 % — ABNORMAL LOW (ref 39.0–52.0)
Hemoglobin: 10.8 g/dL — ABNORMAL LOW (ref 13.0–17.0)
MCV: 88.2 fL (ref 78.0–100.0)
RBC: 3.8 MIL/uL — ABNORMAL LOW (ref 4.22–5.81)
WBC: 7.1 10*3/uL (ref 4.0–10.5)

## 2011-10-01 NOTE — Progress Notes (Signed)
Donald Gaines, [place]. [address] [place] [address]  1. ANEMIA-IRON DEFICIENCY  CBC, Differential, Iron and TIBC, Ferritin, CBC, Ferritin, CBC, Ferritin, CBC, Iron and TIBC, Ferritin, Differential, Comprehensive metabolic panel    CURRENT THERAPY: IV Feraheme (last infusion was on 10/16/2010)  INTERVAL HISTORY: Donald Gaines 71 y.o. male returns for  regular  visit for followup of iron deficiency anemia.    Mr. Collingwood reports that over the last few weeks he has not wanted to get out of bed.  I initially thought this was anhedonia, but he reports that it was because his energy level is down significantly.  He is noted to be pale on inspection.  He says he wanted to stay in bed all day.  His wife/significant other accompanies him today and unfortunately their family unit has been under a significant amount of stress lately because her father has end-stage heart disease.  This has been creating a lot of stress on the patient and his wife.   Other than fatigue and weakness, the patient denies any complaints.  He has not identified blood in his stool, black tarry stool, hematuria, or other indications of blood loss.   He continues to smoke 1 ppd and he is not interested in quitting smoking, but smoking cessation education was provided today.   Past Medical History  Diagnosis Date  . TIA (transient ischemic attack)     2005  . GERD (gastroesophageal reflux disease)   . Coronary atherosclerosis of native coronary artery     Multivessel, BMS SVG TO RCA 2000, reportedly  "2" additional stents in interim  . Hyperlipidemia   . Hypertension   . Bilateral carotid artery stenosis   . Depression   . Diabetes mellitus, type 2   . Restrictive lung disease   . Candida esophagitis   . Hemorrhoids   . Arteriovenous malformation small bowel     Capsule study 3/10  . GI bleeding     Cecal ulcers, arteriovenous malformations  . Chronic diarrhea SEP 2011    ?ETIOLOGY-DIABETIC ENTEROPATHY, LACTOSE  INTOLERANCE,  OR IBS-MIXED  . Bloating AUG 2009    NL HBT FOR SIBO  . Anemia FEB 2008 FEDA 2o to AVMs, & large colon ulcers    HB 10.7 MCV 74.3; Dr Tressie Stalker  . BPH (benign prostatic hypertrophy)     Dr [place] Pink  . Glaucoma   . Cataract     has COLONIC POLYPS, ADENOMATOUS; DIABETES MELLITUS; ANEMIA-IRON DEFICIENCY; TOBACCO ABUSE; CORONARY ATHEROSCLEROSIS NATIVE CORONARY ARTERY; HEMORRHOIDS, INTERNAL; EOSINOPHILIC ESOPHAGITIS; GERD; CONSTIPATION; IBS; FATTY LIVER DISEASE; ARTERIOVENOUS MALFORMATION; DIARRHEA; Essential hypertension, benign; and Mixed hyperlipidemia on his problem list.     is allergic to penicillins.  Mr. Curby had no medications administered during this visit.  Past Surgical History  Procedure Date  . Esophagogastroduodenoscopy SEP 09    DUODENAL LIPOMA  . Capsule endo 03/10  . Right inguinal hernia repair   . Coronary artery bypass graft 1994  . Hydrogen breath test 2009  . Colonoscopy APR 2008    SIMPLE ADENOMA, [place] TICS, IH  . Colonoscopy FEB 2008 ANEMIA. MELENA     2 LARGE ILEOCEAL ULCERS 2o to ASA, [place] TICS, IH  . Colonoscopy SEP 2009 TRANSFUSION DEP ANEMIA    AC AVM-ABLATED, [place] TICS, IH  . Upper gastrointestinal endoscopy SEP 2009    GASTRIC AVM ABLATED, NL DUO Bx    Denies any headaches, dizziness, double vision, fevers, chills, night sweats, nausea, vomiting, diarrhea, constipation, chest pain, heart palpitations,  shortness of breath, blood in stool, black tarry stool, urinary pain, urinary burning, urinary frequency, hematuria.   PHYSICAL EXAMINATION  ECOG PERFORMANCE STATUS: 1 - Symptomatic but completely ambulatory  Filed Vitals:   10/01/11 1019  BP: 107/58  Pulse: 64  Temp: 98.1 F (36.7 C)    GENERAL:alert, no distress, well nourished, well developed, comfortable, cooperative, obese, pale and smiling SKIN: no rashes or significant lesions, pale HEAD: Normocephalic, No masses, lesions, tenderness or abnormalities EYES: normal, pale  conjunctiva EARS: External ears normal OROPHARYNX:lips, buccal mucosa, and tongue normal and mucous membranes are moist  NECK: supple, trachea midline LYMPH:  not examined BREAST:not examined LUNGS: clear to auscultation and percussion, decreased breath sounds HEART: regular rate & rhythm, no murmurs, no gallops, S1 normal and S2 normal ABDOMEN:abdomen soft, non-tender, obese and normal bowel sounds BACK: Back symmetric, no curvature. EXTREMITIES:less then 2 second capillary refill, no joint deformities, effusion, or inflammation, no edema, no skin discoloration, no cyanosis, positive findings:  Clubbing of fingernails  NEURO: alert & oriented x 3 with fluent speech, no focal motor/sensory deficits, gait normal   LABORATORY DATA: CBC    Component Value Date/Time   WBC 7.1 10/01/2011 1134   RBC 3.80* 10/01/2011 1134   HGB 10.8* 10/01/2011 1134   HCT 33.5* 10/01/2011 1134   PLT 188 10/01/2011 1134   MCV 88.2 10/01/2011 1134   MCH 28.4 10/01/2011 1134   MCHC 32.2 10/01/2011 1134   RDW 14.5 10/01/2011 1134   LYMPHSABS 0.9 10/01/2011 1134   MONOABS 0.6 10/01/2011 1134   EOSABS 0.1 10/01/2011 1134   BASOSABS 0.0 10/01/2011 1134    Lab Results  Component Value Date   IRON 83 09/27/2008   TIBC 476* 09/27/2008   FERRITIN 145 06/01/2011      PENDING LABS: CBC diff, Ferritin, Iron/TIBC    ASSESSMENT:  1. Severe iron deficiency anemia but with an excellent response to IV Feraheme though we have not had to give him any since August.  2. Arteriovenous malformation of the small bowel and possibly large bowel with ablation September 2009, still losing iron intermittently.  3. Dementia.  4. Chronic depression, on Prozac 20 mg b.i.d. and p.r.n. Xanax.  5. Chronic obstructive pulmonary disease, still smoking.  6. Hypertension.  7. Fatty liver.  8. Bilateral carotid artery stenosis.  9. History of stroke in the past.  10.Depression as a young man with suicidal tendencies years ago.    11.Gastroesophageal reflux disease.  12.Alcoholism in the past, quitting years ago.  13.Cocaine use in the past, quitting years ago.  14.Marijuana use in the past, quitting years ago.  15.Right orchiectomy secondary to trauma and he has been him impotent since 2001.   PLAN:  1. I personally reviewed and went over laboratory results with the patient. 2. Lab work today: CBC, Iron/TIBC, Ferritin 3. Lab work every 6 weeks: CBC, ferritin. 4. Anticipate requiring Feraheme 1020 mg IV tomorrow or Monday pending lab results.  5. Smoking cessation education provided.  6. Return in 4 months for follow-up.  All questions were answered. The patient knows to call the clinic with any problems, questions or concerns. We can certainly see the patient much sooner if necessary.  Tawonna Esquer

## 2011-10-01 NOTE — Patient Instructions (Signed)
[facility]  Discharge Instructions Donald Gaines  [id number] [birth year] Dr. Everardo All    RECOMMENDATIONS MADE BY THE CONSULTANT AND ANY TEST RESULTS WILL BE SENT TO YOUR REFERRING DOCTOR.   EXAM FINDINGS BY MD TODAY AND SIGNS AND SYMPTOMS TO REPORT TO CLINIC OR PRIMARY MD:   Labs today  Probable iron infusion tomorrow  See Dr. Tressie Stalker in 4 months  Return in 6 months for labs   I acknowledge that I have been informed and understand all the instructions given to me and received a copy. I do not have any more questions at this time, but understand that I may call the Specialty Clinic at [facility] at [phone] during business hours should I have any further questions or need assistance in obtaining follow-up care.    __________________________________________  _____________  __________ Signature of Patient or Authorized Representative            Date                   Time    __________________________________________ Nurse's Signature

## 2011-10-02 ENCOUNTER — Encounter (HOSPITAL_BASED_OUTPATIENT_CLINIC_OR_DEPARTMENT_OTHER): Payer: Medicare Other

## 2011-10-02 VITALS — BP 120/55 | HR 64 | Temp 97.5°F

## 2011-10-02 DIAGNOSIS — D509 Iron deficiency anemia, unspecified: Secondary | ICD-10-CM

## 2011-10-02 LAB — IRON AND TIBC
Saturation Ratios: 6 % — ABNORMAL LOW (ref 20–55)
TIBC: 359 ug/dL (ref 215–435)
UIBC: 336 ug/dL (ref 125–400)

## 2011-10-02 MED ORDER — SODIUM CHLORIDE 0.9 % IV SOLN
1020.0000 mg | Freq: Once | INTRAVENOUS | Status: AC
  Administered 2011-10-02: 1020 mg via INTRAVENOUS
  Filled 2011-10-02: qty 34

## 2011-10-02 MED ORDER — SODIUM CHLORIDE 0.9 % IJ SOLN
INTRAMUSCULAR | Status: AC
  Filled 2011-10-02: qty 10

## 2011-10-02 MED ORDER — SODIUM CHLORIDE 0.9 % IV SOLN
Freq: Once | INTRAVENOUS | Status: AC
  Administered 2011-10-02: 13:00:00 via INTRAVENOUS

## 2011-10-02 MED ORDER — SODIUM CHLORIDE 0.9 % IJ SOLN
10.0000 mL | INTRAMUSCULAR | Status: DC | PRN
  Administered 2011-10-02: 10 mL
  Filled 2011-10-02: qty 10

## 2011-10-02 NOTE — Progress Notes (Signed)
Tolerated infusion well. 

## 2011-11-12 ENCOUNTER — Encounter (HOSPITAL_COMMUNITY): Payer: Medicare Other | Attending: Oncology

## 2011-11-12 DIAGNOSIS — D509 Iron deficiency anemia, unspecified: Secondary | ICD-10-CM

## 2011-11-12 LAB — CBC
Hemoglobin: 12.8 g/dL — ABNORMAL LOW (ref 13.0–17.0)
MCHC: 32.4 g/dL (ref 30.0–36.0)
RDW: 15.6 % — ABNORMAL HIGH (ref 11.5–15.5)
WBC: 8.9 10*3/uL (ref 4.0–10.5)

## 2011-11-12 LAB — FERRITIN: Ferritin: 170 ng/mL (ref 22–322)

## 2011-11-12 NOTE — Progress Notes (Signed)
Labs drawn today for cbc,ferr 

## 2011-11-26 ENCOUNTER — Other Ambulatory Visit (HOSPITAL_COMMUNITY): Payer: Self-pay | Admitting: Urology

## 2011-11-26 DIAGNOSIS — R32 Unspecified urinary incontinence: Secondary | ICD-10-CM

## 2011-11-26 DIAGNOSIS — N400 Enlarged prostate without lower urinary tract symptoms: Secondary | ICD-10-CM

## 2011-12-02 ENCOUNTER — Ambulatory Visit (HOSPITAL_COMMUNITY)
Admission: RE | Admit: 2011-12-02 | Discharge: 2011-12-02 | Disposition: A | Discharge status: Home / Self Care | Payer: Medicare Other | Source: Ambulatory Visit | Attending: Urology | Admitting: Urology

## 2011-12-02 DIAGNOSIS — R32 Unspecified urinary incontinence: Secondary | ICD-10-CM | POA: Insufficient documentation

## 2011-12-02 DIAGNOSIS — N138 Other obstructive and reflux uropathy: Secondary | ICD-10-CM | POA: Insufficient documentation

## 2011-12-02 DIAGNOSIS — N400 Enlarged prostate without lower urinary tract symptoms: Secondary | ICD-10-CM

## 2011-12-02 DIAGNOSIS — N401 Enlarged prostate with lower urinary tract symptoms: Secondary | ICD-10-CM | POA: Insufficient documentation

## 2011-12-24 ENCOUNTER — Encounter (HOSPITAL_COMMUNITY): Payer: Medicare Other | Attending: Oncology

## 2011-12-24 DIAGNOSIS — D509 Iron deficiency anemia, unspecified: Secondary | ICD-10-CM

## 2011-12-24 LAB — CBC
HCT: 37.7 % — ABNORMAL LOW (ref 39.0–52.0)
Platelets: 195 10*3/uL (ref 150–400)
RDW: 16.3 % — ABNORMAL HIGH (ref 11.5–15.5)
WBC: 8.5 10*3/uL (ref 4.0–10.5)

## 2011-12-24 NOTE — Progress Notes (Signed)
Labs drawn today for cbc,ferr 

## 2012-01-28 ENCOUNTER — Other Ambulatory Visit (HOSPITAL_COMMUNITY): Payer: Medicare Other

## 2012-01-29 ENCOUNTER — Encounter (HOSPITAL_COMMUNITY): Payer: Medicare Other | Attending: Oncology

## 2012-01-29 DIAGNOSIS — D509 Iron deficiency anemia, unspecified: Secondary | ICD-10-CM | POA: Insufficient documentation

## 2012-01-29 LAB — DIFFERENTIAL
Basophils Relative: 1 % (ref 0–1)
Lymphocytes Relative: 15 % (ref 12–46)
Monocytes Absolute: 0.3 10*3/uL (ref 0.1–1.0)
Monocytes Relative: 5 % (ref 3–12)

## 2012-01-29 LAB — COMPREHENSIVE METABOLIC PANEL
Alkaline Phosphatase: 79 U/L (ref 39–117)
BUN: 13 mg/dL (ref 6–23)
Chloride: 103 mEq/L (ref 96–112)
Creatinine, Ser: 0.97 mg/dL (ref 0.50–1.35)
GFR calc Af Amer: >90 mL/min (ref >90)
GFR calc non Af Amer: 82 mL/min — ABNORMAL LOW (ref >90)
Glucose, Bld: 85 mg/dL (ref 70–99)
Potassium: 3.5 mEq/L (ref 3.5–5.1)
Total Bilirubin: 0.3 mg/dL (ref 0.3–1.2)

## 2012-01-29 LAB — CBC
Platelets: 185 10*3/uL (ref 150–400)
RBC: 3.91 MIL/uL — ABNORMAL LOW (ref 4.22–5.81)
WBC: 5.2 10*3/uL (ref 4.0–10.5)

## 2012-01-29 NOTE — Progress Notes (Signed)
Donald Gaines presented for labwork. Labs per MD order drawn via Peripheral Line 25 gauge needle inserted in RFA  Procedure without incident.  Patient tolerated procedure well.

## 2012-01-30 LAB — FERRITIN: Ferritin: 52 ng/mL (ref 22–322)

## 2012-01-30 LAB — IRON AND TIBC
Iron: 38 ug/dL — ABNORMAL LOW (ref 42–135)
UIBC: 271 ug/dL (ref 125–400)

## 2012-02-01 ENCOUNTER — Encounter (HOSPITAL_BASED_OUTPATIENT_CLINIC_OR_DEPARTMENT_OTHER): Payer: Medicare Other | Admitting: Oncology

## 2012-02-01 ENCOUNTER — Encounter (HOSPITAL_COMMUNITY): Payer: Self-pay | Admitting: Oncology

## 2012-02-01 VITALS — BP 122/79 | HR 64 | Temp 97.4°F | Resp 16 | Wt 194.1 lb

## 2012-02-01 DIAGNOSIS — Q2733 Arteriovenous malformation of digestive system vessel: Secondary | ICD-10-CM

## 2012-02-01 DIAGNOSIS — D509 Iron deficiency anemia, unspecified: Secondary | ICD-10-CM

## 2012-02-01 DIAGNOSIS — K700 Alcoholic fatty liver: Secondary | ICD-10-CM

## 2012-02-01 DIAGNOSIS — J449 Chronic obstructive pulmonary disease, unspecified: Secondary | ICD-10-CM

## 2012-02-01 DIAGNOSIS — J4489 Other specified chronic obstructive pulmonary disease: Secondary | ICD-10-CM

## 2012-02-01 NOTE — Patient Instructions (Signed)
[facility]  Discharge Instructions  RECOMMENDATIONS MADE BY THE CONSULTANT AND ANY TEST RESULTS WILL BE SENT TO YOUR REFERRING DOCTOR.   EXAM FINDINGS BY MD TODAY AND SIGNS AND SYMPTOMS TO REPORT TO CLINIC OR PRIMARY MD: Exam findings as discussed by Dr. Tressie Stalker.  SPECIAL INSTRUCTIONS/FOLLOW-UP: 1.  Please follow-up with Dr. [place] Crane regarding your diabetes. 2.  We are scheduling you next week for an iron infusion. 3.  We are also scheduling you for labs in December and March. 4.  We will see you again in March after you have your labwork.  I acknowledge that I have been informed and understand all the instructions given to me and received a copy. I do not have any more questions at this time, but understand that I may call the Specialty Clinic at [facility] at [phone] during business hours should I have any further questions or need assistance in obtaining follow-up care.    __________________________________________  _____________  __________ Signature of Patient or Authorized Representative            Date                   Time    __________________________________________ Nurse's Signature

## 2012-02-01 NOTE — Progress Notes (Signed)
Problem #1 severe iron deficiency anemia with an excellent response to IV feraheme he still requires intermittently. Problem #2 AV malformations in the small bowel and possibly large bowel with ablation in September 2009 and he continues to lose iron Problem #3 diabetes mellitus Problem #4 dementia Problem #5 COPD still smoking Problem #6 cocaine and marijuana use the past as well as excessive alcohol use in the past Problem #7 history of stroke  Problem #8 hypertension  Problem #9 fatty liver Problem #10 bilateral carotid artery stenosis Problem #11 the right orchiectomy in 2011 with impotence since then His iron studies are starting to show iron deficiency once again. He does see black stools intermittently he still. He does not see bright red blood. His hemoglobin is also starting to drop a little bit as well. So we will give him feraheme once again next week. We will then check his blood work in one month and 4 months from then. I've encouraged him to go back to Dr. Nevada Crane since he has numerous questions about his diabetes as does his wife. It appears that he eats more in the way of snacks in good pulse him food for his diabetes. He also needs to probably see Dr. Oneida Alar again but he does not want to go back. I think from the standpoint of his GI blood losses he still needs to see her. He may need further ablation of other lesions. Nevertheless we will continue to replace them, I have asked him to quit smoking, and I've asked him to listen to his wife about his diabetic diet in the interim

## 2012-02-04 ENCOUNTER — Other Ambulatory Visit (HOSPITAL_COMMUNITY): Payer: Medicare Other

## 2012-02-09 ENCOUNTER — Encounter (HOSPITAL_BASED_OUTPATIENT_CLINIC_OR_DEPARTMENT_OTHER): Payer: Medicare Other

## 2012-02-09 VITALS — BP 126/74 | HR 60 | Temp 97.7°F | Resp 16

## 2012-02-09 DIAGNOSIS — D509 Iron deficiency anemia, unspecified: Secondary | ICD-10-CM

## 2012-02-09 MED ORDER — SODIUM CHLORIDE 0.9 % IJ SOLN
10.0000 mL | INTRAMUSCULAR | Status: DC | PRN
  Filled 2012-02-09: qty 10

## 2012-02-09 MED ORDER — SODIUM CHLORIDE 0.9 % IV SOLN
Freq: Once | INTRAVENOUS | Status: AC
  Administered 2012-02-09: 13:00:00 via INTRAVENOUS

## 2012-02-09 MED ORDER — SODIUM CHLORIDE 0.9 % IJ SOLN
INTRAMUSCULAR | Status: AC
  Filled 2012-02-09: qty 10

## 2012-02-09 MED ORDER — SODIUM CHLORIDE 0.9 % IV SOLN
1020.0000 mg | Freq: Once | INTRAVENOUS | Status: AC
  Administered 2012-02-09: 1020 mg via INTRAVENOUS
  Filled 2012-02-09: qty 34

## 2012-02-09 NOTE — Progress Notes (Signed)
Infusion complete, patient tolerated well.  IV removed intact.  No further infusions needed at this time until further instruction from MD.

## 2012-02-17 ENCOUNTER — Other Ambulatory Visit: Payer: Self-pay | Admitting: Cardiology

## 2012-02-17 ENCOUNTER — Telehealth: Payer: Self-pay | Admitting: Cardiology

## 2012-02-17 NOTE — Telephone Encounter (Signed)
Letter sent to patient to schedule a visit with Dr Domenic Polite.  Past due for follow up.

## 2012-02-17 NOTE — Telephone Encounter (Signed)
Made patient appointment for January.  Patient was past due for appointment.

## 2012-03-02 ENCOUNTER — Encounter (HOSPITAL_COMMUNITY): Payer: Medicare Other | Attending: Oncology

## 2012-03-02 DIAGNOSIS — D509 Iron deficiency anemia, unspecified: Secondary | ICD-10-CM

## 2012-03-02 LAB — CBC
Hemoglobin: 12.7 g/dL — ABNORMAL LOW (ref 13.0–17.0)
MCH: 30.7 pg (ref 26.0–34.0)
RBC: 4.14 MIL/uL — ABNORMAL LOW (ref 4.22–5.81)

## 2012-03-02 NOTE — Progress Notes (Signed)
Labs drawn today for cbc,ferr,Iron and IBC 

## 2012-03-03 LAB — IRON AND TIBC
Saturation Ratios: 21 % (ref 20–55)
TIBC: 310 ug/dL (ref 215–435)
UIBC: 244 ug/dL (ref 125–400)

## 2012-03-03 LAB — FERRITIN: Ferritin: 258 ng/mL (ref 22–322)

## 2012-03-14 ENCOUNTER — Other Ambulatory Visit: Payer: Self-pay | Admitting: Cardiology

## 2012-03-14 NOTE — Telephone Encounter (Signed)
rx sent to pharmacy by e-script per ntoed pt upcoming apt with SM

## 2012-03-17 ENCOUNTER — Ambulatory Visit (INDEPENDENT_AMBULATORY_CARE_PROVIDER_SITE_OTHER): Payer: Medicare Other | Admitting: Adult Health

## 2012-03-17 ENCOUNTER — Encounter: Payer: Self-pay | Admitting: Adult Health

## 2012-03-17 ENCOUNTER — Encounter: Payer: Self-pay | Admitting: *Deleted

## 2012-03-17 ENCOUNTER — Ambulatory Visit (HOSPITAL_COMMUNITY)
Admission: RE | Admit: 2012-03-17 | Discharge: 2012-03-17 | Disposition: A | Discharge status: Home / Self Care | Payer: Medicare Other | Source: Ambulatory Visit | Attending: Adult Health | Admitting: Adult Health

## 2012-03-17 ENCOUNTER — Other Ambulatory Visit: Payer: Self-pay | Admitting: Adult Health

## 2012-03-17 VITALS — BP 120/62 | HR 60 | Ht 65.0 in | Wt 193.0 lb

## 2012-03-17 DIAGNOSIS — I2510 Atherosclerotic heart disease of native coronary artery without angina pectoris: Secondary | ICD-10-CM

## 2012-03-17 DIAGNOSIS — D509 Iron deficiency anemia, unspecified: Secondary | ICD-10-CM

## 2012-03-17 DIAGNOSIS — F17200 Nicotine dependence, unspecified, uncomplicated: Secondary | ICD-10-CM

## 2012-03-17 DIAGNOSIS — I10 Essential (primary) hypertension: Secondary | ICD-10-CM

## 2012-03-17 DIAGNOSIS — I517 Cardiomegaly: Secondary | ICD-10-CM | POA: Insufficient documentation

## 2012-03-17 DIAGNOSIS — Z01818 Encounter for other preprocedural examination: Secondary | ICD-10-CM | POA: Insufficient documentation

## 2012-03-17 DIAGNOSIS — E781 Pure hyperglyceridemia: Secondary | ICD-10-CM

## 2012-03-17 DIAGNOSIS — Q279 Congenital malformation of peripheral vascular system, unspecified: Secondary | ICD-10-CM

## 2012-03-17 LAB — CBC
HCT: 36.7 % — ABNORMAL LOW (ref 39.0–52.0)
MCH: 30.9 pg (ref 26.0–34.0)
MCHC: 33 g/dL (ref 30.0–36.0)
RDW: 15.3 % (ref 11.5–15.5)

## 2012-03-17 LAB — BASIC METABOLIC PANEL
BUN: 13 mg/dL (ref 6–23)
Calcium: 9.6 mg/dL (ref 8.4–10.5)
Glucose, Bld: 124 mg/dL — ABNORMAL HIGH (ref 70–99)
Potassium: 3.7 mEq/L (ref 3.5–5.3)
Sodium: 137 mEq/L (ref 135–145)

## 2012-03-17 LAB — PROTIME-INR: INR: 0.98 (ref <1.50)

## 2012-03-17 LAB — APTT: aPTT: 38.3 seconds — ABNORMAL HIGH (ref 24–37)

## 2012-03-17 NOTE — Progress Notes (Addendum)
HPI: Donald Gaines is a medically complicated 71 y/o patient of Dr. Domenic Polite we are seeing for ongoing assessment and treatment of known CAD, with CABG, BMS to SVG to RCA in 2000, two more subsequent stents to unknown arteries, completed by cardiologist Dr. Truett Mainland, in [facility]; Bilateral carotid artery stenosis, hypertension, hyperlipidemia, history of AV malformation (followed by Dr.Fields in [place])  on Plavix only, diabetes, and other medical issues (please review history section).    He comes today with recurrent chest pain at rest. He states that two weeks ago, while lying in bed, but awake, he had severe discomfort in his chest, beginning on the right side, then substernal radiating to both the right and left side with associated dyspnea and palpitations, fluttering. Got up and took 2 NTG , sat up for about an hour and then went back to bed. Episode lasted a little over an hour. He did not seek medical attention.  He quit smoking right after this episode.   Since that episode he has taken NTG daily for recurrent chest pain and mild dyspnea at rest. The symptoms are not as severe as the initial episode 2 weeks ago. He is feeling more fatigued. Does not complain of diaphoresis or near syncope with the palpitations or fluttering. He saw PCP, Dr. Nevada Crane, who referred him to cardiology.   Most recent stress test was completed in October of 2011:  "Abnormal Lexiscan Myoview as outlined. There were no clearly diagnostic ST-segment changes noted. Perfusion data show evidence of probable scar affecting the mid to basal inferolateral wall with mild periinfarct ischemia, also potential element of scar in the basal anterior wall. LVEF is 53% with suggestion of inferior hypokinesis to akinesis. This would be consistent with underlying CAD"    Allergies  Allergen Reactions  . Penicillins     Current Outpatient Prescriptions  Medication Sig Dispense Refill  . Aclidinium Bromide (TUDORZA  PRESSAIR) 400 MCG/ACT AEPB Inhale 400 mcg into the lungs daily.      Marland Kitchen ADVAIR DISKUS 250-50 MCG/DOSE AEPB Inhale 1 puff into the lungs 2 (two) times daily.       . Albuterol Sulfate (PROAIR HFA IN) Inhale 2 puffs into the lungs 3 (three) times daily.       Marland Kitchen ALPRAZolam (XANAX) 0.5 MG tablet Take 0.5 mg by mouth at bedtime as needed.        . carvedilol (COREG) 6.25 MG tablet Take 6.25 mg by mouth 2 (two) times daily with a meal.       . cetirizine (ZYRTEC) 10 MG tablet Take 10 mg by mouth daily.        . clopidogrel (PLAVIX) 75 MG tablet Take 1 tablet (75 mg total) by mouth daily.  30 tablet  8  . clopidogrel (PLAVIX) 75 MG tablet TAKE 1 TABLET BY MOUTH ONCE DAILY  30 tablet  2  . CRESTOR 20 MG tablet TAKE 1 TABLET BY MOUTH ONCE DAILY  30 tablet  1  . dutasteride (AVODART) 0.5 MG capsule Take 0.5 mg by mouth daily.       Marland Kitchen Fesoterodine Fumarate (TOVIAZ) 8 MG TB24 Take 8 mg by mouth daily.       Marland Kitchen FLUoxetine (PROZAC) 20 MG capsule TAKE 1 CAPSULE TWICE A DAY  60 capsule  3  . fluticasone (FLONASE) 50 MCG/ACT nasal spray as needed.       Marland Kitchen FLUZONE HIGH-DOSE SUSP       . furosemide (LASIX) 40 MG tablet Take 40  mg by mouth daily.       Marland Kitchen glimepiride (AMARYL) 2 MG tablet Take 2 mg by mouth daily before breakfast.        . isosorbide mononitrate (IMDUR) 60 MG 24 hr tablet Take 1 tablet (60 mg total) by mouth daily.  30 tablet  6  . losartan (COZAAR) 50 MG tablet Take 50 mg by mouth daily.       . Memantine HCl ER (NAMENDA XR) 28 MG CP24 Take 28 mg by mouth daily.      Marland Kitchen MYRBETRIQ 25 MG TB24 25 mg daily.       Marland Kitchen NITROSTAT 0.4 MG SL tablet Place 0.4 mg under the tongue every 5 (five) minutes as needed.       . NON FORMULARY dIGESTIVE aDVANTAGE (LACTOSE DEFENSE FORMULA) DAILY      . omeprazole (PRILOSEC) 20 MG capsule Take 20 mg by mouth 2 (two) times daily. 1 30 MIN BEFORE MEALS TWICE DAILY      . Probiotic Product (ALIGN) 4 MG CAPS Take 4 mg by mouth as needed.       . rosuvastatin (CRESTOR) 20 MG  tablet Take 1 tablet (20 mg total) by mouth daily.  30 tablet  8  . sitaGLIPtan-metformin (JANUMET) 50-1000 MG per tablet Take 1 tablet by mouth 2 (two) times daily with a meal.        . Tamsulosin HCl (FLOMAX) 0.4 MG CAPS Take 0.4 mg by mouth daily. Takes in AM        Past Medical History  Diagnosis Date  . TIA (transient ischemic attack)     2005  . GERD (gastroesophageal reflux disease)   . Coronary atherosclerosis of native coronary artery     Multivessel, BMS SVG TO RCA 2000, reportedly  "2" additional stents in interim  . Hyperlipidemia   . Hypertension   . Bilateral carotid artery stenosis   . Depression   . Diabetes mellitus, type 2   . Restrictive lung disease   . Candida esophagitis   . Hemorrhoids   . Arteriovenous malformation small bowel     Capsule study 3/10  . GI bleeding     Cecal ulcers, arteriovenous malformations  . Chronic diarrhea SEP 2011    ?ETIOLOGY-DIABETIC ENTEROPATHY, LACTOSE INTOLERANCE,  OR IBS-MIXED  . Bloating AUG 2009    NL HBT FOR SIBO  . Anemia FEB 2008 FEDA 2o to AVMs, & large colon ulcers    HB 10.7 MCV 74.3; Dr Tressie Stalker  . BPH (benign prostatic hypertrophy)     Dr [place] Pink  . Glaucoma(365)   . Cataract     Past Surgical History  Procedure Date  . Esophagogastroduodenoscopy SEP 09    DUODENAL LIPOMA  . Capsule endo 03/10  . Right inguinal hernia repair   . Coronary artery bypass graft 1994  . Hydrogen breath test 2009  . Colonoscopy APR 2008    SIMPLE ADENOMA, [place] [facility], IH  . Colonoscopy FEB 2008 ANEMIA. [place]     2 LARGE ILEOCEAL ULCERS 2o to ASA, [place] [facility], IH  . Colonoscopy SEP 2009 TRANSFUSION DEP ANEMIA    AC AVM-ABLATED, [place] [facility], IH  . Upper gastrointestinal endoscopy SEP 2009    GASTRIC AVM ABLATED, NL DUO Bx    Family History: Father with CAD, Hypertension. Mother with Cancer  History   Social History  . Marital Status: Married    Spouse Name: N/A    Number of Children: N/A  . Years of Education: N/A  Occupational History  . Not on file.   Social History Main Topics  . Smoking status: Current Every Day Smoker -- 1.0 packs/day    Types: Cigarettes  . Smokeless tobacco: Never Used  . Alcohol Use: No  . Drug Use: No  . Sexually Active: Not on file   Other Topics Concern  . Not on file   Social History Narrative  . No narrative on file    [id number] of systems complete and found to be negative unless listed above  PHYSICAL EXAM BP 120/62  Pulse 60  Ht 5\' 5"  (1.651 m)  Wt 193 lb (87.544 kg)  BMI 32.12 kg/m2  SpO2 96%  General: Well developed, well nourished, in no acute distress Head: Eyes PERRLA, No xanthomas.   Normal cephalic and atramatic  Lungs: Clear bilaterally to auscultation and percussion. Heart: HRRR S1 S2, without MRG.  Pulses are 2+ & equal.            No carotid bruit. No JVD.  No abdominal bruits. No femoral bruits. Abdomen: Bowel sounds are positive, abdomen soft and non-tender without masses or                  Hernia's noted. Msk:  Back normal, normal gait. Normal strength and tone for age. Extremities: No clubbing, cyanosis or edema.  DP +1 Neuro: Alert and oriented X 3. Psych:  Good affect, responds appropriately  [id number] bradycardia rate of 58 bpm, T-wave inversion in the lateral leads, with nonspecific T-wave abnormalities in the inferior leads. Compared to last EKG in April of 2013 T-wave inversion in anterior leads has resolved.  ASSESSMENT AND PLAN

## 2012-03-17 NOTE — Patient Instructions (Addendum)
Your physician recommends that you schedule a follow-up appointment in: [place]  Your physician recommends that you return for lab work in: [place] (PT,PTT,CBC,BMET) SLIPS GIVEN  A chest x-ray takes a picture of the organs and structures inside the chest, including the heart, lungs, and blood vessels. This test can show several things, including, whether the heart is enlarges; whether fluid is building up in the lungs; and whether pacemaker / defibrillator leads are still in place.TODAY, SLIPS GIVEN  Your physician has requested that you have a cardiac catheterization. Cardiac catheterization is used to diagnose and/or treat various heart conditions. Doctors may recommend this procedure for a number of different reasons. The most common reason is to evaluate chest pain. Chest pain can be a symptom of coronary artery disease (CAD), and cardiac catheterization can show whether plaque is narrowing or blocking your heart's arteries. This procedure is also used to evaluate the valves, as well as measure the blood flow and oxygen levels in different parts of your heart. For further information please visit [email or web address]. Please follow instruction sheet, as given.

## 2012-03-17 NOTE — Assessment & Plan Note (Signed)
He stopped smoking approximately 11 days ago. Encouraged to continue cessation.

## 2012-03-17 NOTE — Assessment & Plan Note (Signed)
Followed by Dr. Barney Drain in [place]. No complaints of abdominal pain or bleeding.

## 2012-03-17 NOTE — Assessment & Plan Note (Signed)
Currently well controlled on losartan 50 mg daily and nitrates.

## 2012-03-17 NOTE — Assessment & Plan Note (Signed)
Most recent labs completed on 03/02/2012 Hgb 12.7. Hct 38.7. Platelets 174. Followed by Dr. Lexine Baton.

## 2012-03-17 NOTE — Assessment & Plan Note (Addendum)
This is a difficult situation in patient with known CAD, CABG and stents X 3 with AV malformation which would be precarious for use of DAPT if PCI is needed. However, he is having daily episodes of what appears to be unstable angina with use of NTG in addition to daily Imdur at 60 mg. I considered repeating stress test, but last one was abnormal with his previous MI and scarring and therefore cardiac catheterization is the better option.   I have called and spoken with Dr. Domenic Polite, as he is this patient's primary cardiologist to discuss the need to proceed with cath. Dr. Domenic Polite reviewed the patient's chart remotely from [facility] and agreed that catheterization is warranted. Post procedure recommendations will be discussed by interventionalist in the difficult situation of AV malformation with increased bleeding issues if ASA is used. Possible revascularization options may be considered once full evaluation is completed.    This has been discussed with the patient and his wife in the clinic area. They agree to proceed with cardiac cath. It has been scheduled with Dr. [name] on Monday, March 21, 2012. He has been advised that if he has recurrent severe chest pain, he is to call EMS and be transported to [facility], even though cath has been scheduled. He and his wife verbalize understanding.

## 2012-03-17 NOTE — Progress Notes (Deleted)
Name: Donald Gaines    DOB: 1941/01/05  Age: 71 y.o.  MR#: [id number]       PCP:  Wende Neighbors, MD      Insurance: @PAYORNAME @   CC:   No chief complaint on file.   VS BP 120/62  Pulse 60  Ht 5\' 5"  (1.651 m)  Wt 193 lb (87.544 kg)  BMI 32.12 kg/m2  SpO2 96%  Weights Current Weight  03/17/12 193 lb (87.544 kg)  02/01/12 194 lb 1.6 oz (88.043 kg)  10/01/11 192 lb (87.091 kg)    Blood Pressure  BP Readings from Last 3 Encounters:  03/17/12 120/62  02/09/12 126/74  02/01/12 122/79     Admit date:  (Not on file) Last encounter with RMR:  Visit date not found   Allergy Allergies  Allergen Reactions  . Penicillins     Current Outpatient Prescriptions  Medication Sig Dispense Refill  . Aclidinium Bromide (TUDORZA PRESSAIR) 400 MCG/ACT AEPB Inhale 400 mcg into the lungs daily.      Marland Kitchen ADVAIR DISKUS 250-50 MCG/DOSE AEPB Inhale 1 puff into the lungs 2 (two) times daily.       . Albuterol Sulfate (PROAIR HFA IN) Inhale 2 puffs into the lungs 3 (three) times daily.       Marland Kitchen ALPRAZolam (XANAX) 0.5 MG tablet Take 0.5 mg by mouth at bedtime as needed.        . carvedilol (COREG) 6.25 MG tablet Take 6.25 mg by mouth 2 (two) times daily with a meal.       . cetirizine (ZYRTEC) 10 MG tablet Take 10 mg by mouth daily.        . clopidogrel (PLAVIX) 75 MG tablet Take 1 tablet (75 mg total) by mouth daily.  30 tablet  8  . clopidogrel (PLAVIX) 75 MG tablet TAKE 1 TABLET BY MOUTH ONCE DAILY  30 tablet  2  . CRESTOR 20 MG tablet TAKE 1 TABLET BY MOUTH ONCE DAILY  30 tablet  1  . dutasteride (AVODART) 0.5 MG capsule Take 0.5 mg by mouth daily.       Marland Kitchen Fesoterodine Fumarate (TOVIAZ) 8 MG TB24 Take 8 mg by mouth daily.       Marland Kitchen FLUoxetine (PROZAC) 20 MG capsule TAKE 1 CAPSULE TWICE A DAY  60 capsule  3  . fluticasone (FLONASE) 50 MCG/ACT nasal spray as needed.       Marland Kitchen FLUZONE HIGH-DOSE SUSP       . furosemide (LASIX) 40 MG tablet Take 40 mg by mouth daily.       Marland Kitchen glimepiride (AMARYL) 2 MG  tablet Take 2 mg by mouth daily before breakfast.        . isosorbide mononitrate (IMDUR) 60 MG 24 hr tablet Take 1 tablet (60 mg total) by mouth daily.  30 tablet  6  . losartan (COZAAR) 50 MG tablet Take 50 mg by mouth daily.       . Memantine HCl ER (NAMENDA XR) 28 MG CP24 Take 28 mg by mouth daily.      Marland Kitchen MYRBETRIQ 25 MG TB24 25 mg daily.       Marland Kitchen NITROSTAT 0.4 MG SL tablet Place 0.4 mg under the tongue every 5 (five) minutes as needed.       . NON FORMULARY dIGESTIVE aDVANTAGE (LACTOSE DEFENSE FORMULA) DAILY      . omeprazole (PRILOSEC) 20 MG capsule Take 20 mg by mouth 2 (two) times daily. 1 30 MIN BEFORE MEALS TWICE DAILY      .  Probiotic Product (ALIGN) 4 MG CAPS Take 4 mg by mouth as needed.       . rosuvastatin (CRESTOR) 20 MG tablet Take 1 tablet (20 mg total) by mouth daily.  30 tablet  8  . sitaGLIPtan-metformin (JANUMET) 50-1000 MG per tablet Take 1 tablet by mouth 2 (two) times daily with a meal.        . Tamsulosin HCl (FLOMAX) 0.4 MG CAPS Take 0.4 mg by mouth daily. Takes in AM        Discontinued Meds:    Medications Discontinued During This Encounter  Medication Reason  . HYDROcodone-acetaminophen (VICODIN) 5-500 MG per tablet Error    Patient Active Problem List  Diagnosis  . COLONIC POLYPS, ADENOMATOUS  . DIABETES MELLITUS  . ANEMIA-IRON DEFICIENCY  . TOBACCO ABUSE  . CORONARY ATHEROSCLEROSIS NATIVE CORONARY ARTERY  . HEMORRHOIDS, INTERNAL  . EOSINOPHILIC ESOPHAGITIS  . GERD  . CONSTIPATION  . IBS  . FATTY LIVER DISEASE  . ARTERIOVENOUS MALFORMATION  . DIARRHEA  . Essential hypertension, benign  . Mixed hyperlipidemia    LABS Infusion on 03/02/2012  Component Date Value  . WBC 03/02/2012 6.5   . RBC 03/02/2012 4.14*  . Hemoglobin 03/02/2012 12.7*  . HCT 03/02/2012 38.7*  . MCV 03/02/2012 93.5   . [facility] 03/02/2012 30.7   . MCHC 03/02/2012 32.8   . RDW 03/02/2012 15.9*  . Platelets 03/02/2012 174   . Ferritin 03/02/2012 258   . Iron 03/02/2012 66     . TIBC 03/02/2012 310   . Saturation Ratios 03/02/2012 21   . UIBC 03/02/2012 244   Infusion on 01/29/2012  Component Date Value  . WBC 01/29/2012 5.2   . RBC 01/29/2012 3.91*  . Hemoglobin 01/29/2012 11.6*  . HCT 01/29/2012 35.1*  . MCV 01/29/2012 89.8   . [facility] 01/29/2012 29.7   . MCHC 01/29/2012 33.0   . RDW 01/29/2012 16.1*  . Platelets 01/29/2012 185   . Iron 01/29/2012 38*  . TIBC 01/29/2012 309   . Saturation Ratios 01/29/2012 12*  . UIBC 01/29/2012 271   . Ferritin 01/29/2012 52   . Neutrophils Relative 01/29/2012 78*  . Neutro Abs 01/29/2012 4.1   . Lymphocytes Relative 01/29/2012 15   . Lymphs Abs 01/29/2012 0.8   . Monocytes Relative 01/29/2012 5   . Monocytes Absolute 01/29/2012 0.3   . Eosinophils Relative 01/29/2012 1   . Eosinophils Absolute 01/29/2012 0.1   . Basophils Relative 01/29/2012 1   . Basophils Absolute 01/29/2012 0.0   . Sodium 01/29/2012 137   . Potassium 01/29/2012 3.5   . Chloride 01/29/2012 103   . CO2 01/29/2012 25   . Glucose, Bld 01/29/2012 85   . BUN 01/29/2012 13   . Creatinine, Ser 01/29/2012 0.97   . Calcium 01/29/2012 9.5   . Total Protein 01/29/2012 7.1   . Albumin 01/29/2012 3.9   . AST 01/29/2012 17   . ALT 01/29/2012 9   . Alkaline Phosphatase 01/29/2012 79   . Total Bilirubin 01/29/2012 0.3   . GFR calc non Af Amer 01/29/2012 82*  . GFR calc Af Amer 01/29/2012 >90   Infusion on 12/24/2011  Component Date Value  . WBC 12/24/2011 8.5   . RBC 12/24/2011 4.20*  . Hemoglobin 12/24/2011 12.2*  . HCT 12/24/2011 37.7*  . MCV 12/24/2011 89.8   . [facility] 12/24/2011 29.0   . MCHC 12/24/2011 32.4   . RDW 12/24/2011 16.3*  . Platelets 12/24/2011 195   . Ferritin  12/24/2011 61      Results for this Opt Visit:     Results for orders placed in visit on 03/02/12  CBC      Component Value Range   WBC 6.5  4.0 - 10.5 K/uL   RBC 4.14 (*) 4.22 - 5.81 MIL/uL   Hemoglobin 12.7 (*) 13.0 - 17.0 g/dL   HCT 38.7 (*) 39.0 - 52.0 %   MCV  93.5  78.0 - 100.0 fL   MCH 30.7  26.0 - 34.0 pg   MCHC 32.8  30.0 - 36.0 g/dL   RDW 15.9 (*) 11.5 - 15.5 %   Platelets 174  150 - 400 K/uL  FERRITIN      Component Value Range   Ferritin 258  22 - 322 ng/mL  IRON AND TIBC      Component Value Range   Iron 66  42 - 135 ug/dL   TIBC 310  215 - 435 ug/dL   Saturation Ratios 21  20 - 55 %   UIBC 244  125 - 400 ug/dL    EKG Orders placed in visit on 03/17/12  . EKG 12-LEAD     Prior Assessment and Plan Problem List as of 03/17/2012          COLONIC POLYPS, ADENOMATOUS   DIABETES MELLITUS   ANEMIA-IRON DEFICIENCY   Last Assessment & Plan Note   01/19/2011 Office Visit Signed 01/20/2011  1:30 PM by Andria Meuse, NP    Check CBC and ferritin.    TOBACCO ABUSE   Last Assessment & Plan Note   07/08/2011 Office Visit Signed 07/08/2011  1:32 PM by Satira Sark, MD    He has not been able to quit smoking.    CORONARY ATHEROSCLEROSIS NATIVE CORONARY ARTERY   Last Assessment & Plan Note   07/08/2011 Office Visit Signed 07/08/2011  1:31 PM by Satira Sark, MD    Relatively stable on medical therapy. ECG with progression in T wave inversion since last tacing, but only two episodes on angina reported. His history of GIB and anemia secondary to AVMs presents significant barrier to revscularization options - not certain if he would be able to adequately maintain DAPT. For now continue observation. Followup arranged.    HEMORRHOIDS, INTERNAL   EOSINOPHILIC ESOPHAGITIS   GERD   Last Assessment & Plan Note   01/19/2011 Office Visit Signed 01/20/2011  1:30 PM by Andria Meuse, NP    Controlled on PPI     CONSTIPATION   IBS   FATTY LIVER DISEASE   ARTERIOVENOUS MALFORMATION   Last Assessment & Plan Note   03/05/2011 Office Visit Signed 03/05/2011 10:37 AM by Danie Binder, MD    Hb stable at [place] 2012. NO ACTIVE GIB. OPV IN 4 MOS. WILL REVIEW LABS AT THAT TIME.    DIARRHEA   Last Assessment & Plan  Note   03/05/2011 Office Visit Signed 03/05/2011 10:29 AM by Danie Binder, MD    MOST LIKELY 2O TO DIABETIC ENTEROPATHY & IBS. Sx improved with imodium and align.  CONTINUE IMODIUM. CONTINUE ALIGN OR [facility] PROBIOTIC EVERY DAY. FOLLOW UP IN 4 MOS.    Essential hypertension, benign   Last Assessment & Plan Note   07/08/2011 Office Visit Signed 07/08/2011  1:31 PM by Satira Sark, MD    Blood pressure well controlled today. No changes made.    Mixed hyperlipidemia   Last Assessment & Plan Note  07/08/2011 Office Visit Signed 07/08/2011  1:32 PM by Satira Sark, MD    Continues on statin therapy.        Imaging: No results found.   FRS Calculation: Score not calculated. Missing: Total Cholesterol

## 2012-03-18 ENCOUNTER — Encounter: Payer: Self-pay | Admitting: Adult Health

## 2012-03-18 NOTE — Progress Notes (Signed)
Patient was discussed with me by phone, seen in the office by Ms. Lawrence NP. I last saw Mr. Goicoechea back in April, at which time he was relatively stable in terms of angina management on medical therapy with fairly complicated medical history. Previous cardiac interventions done in [place] with multivessel CAD status post CABG, subsequent BMS to SVG to RCA in 2000, possibly 2 additional stents as well. He has a history of iron deficiency anemia with GI bleeding, prior documentation of cecal ulcers, arteriovenous malformations -  followed by Dr. Oneida Alar in [place]. He has been on Plavix alone, not on DAPT with history of bleeding.  Patient's symptoms described to me by Ms. Lawrence NP are consistent with accelerating/unstable angina, frequent nitroglycerin use reported. Agree that pursuing stress testing at this point would not be particularly useful, as it would likely be abnormal, but not address the question of whether evascularization might help his symptoms. Cardiac catheterization is the plan at this time, and it was discussed in the office with the patient and his wife per Ms. Lawrence NP. This is scheduled for Monday with Dr. Burt Knack, although he might require hospitalization prior to this if his symptoms continue to worsen.  Cardiac catheterization will help to define his coronary and bypass graft anatomy, also determine whether revascularization would be beneficial. The problem will be in deciding what type of revascularization will be the best option, particularly since DAPT will most likely need to be minimized in duration. If BMS could be used to minimize DAPT to 1 month, this would be optimal, however if DES is better employed, he may need to be considered for no longer than 6 months DAPT. Either way he will still be at risk for recurrent GI bleeding. I will defer to our interventional team and Dr. Burt Knack to help guide the best course of action.  Satira Sark, M.D., F.A.C.C.

## 2012-03-21 ENCOUNTER — Encounter (HOSPITAL_BASED_OUTPATIENT_CLINIC_OR_DEPARTMENT_OTHER)
Admission: RE | Disposition: A | Discharge status: Home / Self Care | Payer: Self-pay | Source: Ambulatory Visit | Attending: Cardiovascular Disease

## 2012-03-21 ENCOUNTER — Inpatient Hospital Stay (HOSPITAL_BASED_OUTPATIENT_CLINIC_OR_DEPARTMENT_OTHER)
Admission: RE | Admit: 2012-03-21 | Discharge: 2012-03-21 | Disposition: A | Discharge status: Home / Self Care | Payer: Medicare Other | Source: Ambulatory Visit | Attending: Cardiovascular Disease | Admitting: Cardiovascular Disease

## 2012-03-21 DIAGNOSIS — I25810 Atherosclerosis of coronary artery bypass graft(s) without angina pectoris: Secondary | ICD-10-CM | POA: Insufficient documentation

## 2012-03-21 DIAGNOSIS — I2510 Atherosclerotic heart disease of native coronary artery without angina pectoris: Secondary | ICD-10-CM | POA: Insufficient documentation

## 2012-03-21 DIAGNOSIS — I209 Angina pectoris, unspecified: Secondary | ICD-10-CM | POA: Insufficient documentation

## 2012-03-21 LAB — POCT I-STAT GLUCOSE
Glucose, Bld: 97 mg/dL (ref 70–99)
Operator id: 194801

## 2012-03-21 SURGERY — JV LEFT HEART CATHETERIZATION WITH CORONARY ANGIOGRAM
Anesthesia: Moderate Sedation

## 2012-03-21 MED ORDER — ACETAMINOPHEN 325 MG PO TABS
650.0000 mg | ORAL_TABLET | ORAL | Status: DC | PRN
  Administered 2012-03-21: 1000 mg via ORAL

## 2012-03-21 MED ORDER — SODIUM CHLORIDE 0.9 % IJ SOLN: 3.0000 mL | Freq: Two times a day (BID) | INTRAMUSCULAR | Status: DC

## 2012-03-21 MED ORDER — ONDANSETRON HCL 4 MG/2ML IJ SOLN: 4.0000 mg | Freq: Four times a day (QID) | INTRAMUSCULAR | Status: DC | PRN

## 2012-03-21 MED ORDER — SODIUM CHLORIDE 0.9 % IV SOLN: INTRAVENOUS | Status: DC

## 2012-03-21 MED ORDER — SODIUM CHLORIDE 0.9 % IV SOLN: 250.0000 mL | INTRAVENOUS | Status: DC | PRN

## 2012-03-21 MED ORDER — SODIUM CHLORIDE 0.9 % IV SOLN: 1.0000 mL/kg/h | INTRAVENOUS | Status: DC

## 2012-03-21 MED ORDER — SODIUM CHLORIDE 0.9 % IJ SOLN: 3.0000 mL | INTRAMUSCULAR | Status: DC | PRN

## 2012-03-21 NOTE — CV Procedure (Signed)
   Cardiac Catheterization Procedure Note  Name: Donald Gaines MRN: [id number] DOB: 10-24-1940  Procedure: Left Heart Cath, Selective Coronary Angiography, LIMA angiography, SVG angiography, LV angiography  Indication: Progressive angina, CCS Class 3, known CAD s/p CABG and multiple PCI procedures   Procedural details: The right groin was prepped, draped, and anesthetized with 1% lidocaine. Using modified Seldinger technique, a 4 French sheath was introduced into the right femoral artery. Standard Judkins catheters were used for coronary angiography, LIMA angiography, and SVG angiography. A 4Fr 3DRC was used for graft angiography. Left ventriculography was performed with a pigtail catheter. Catheter exchanges were performed over a guidewire. There were no immediate procedural complications. The patient was transferred to the post catheterization recovery area for further monitoring.  Procedural Findings: Hemodynamics:  AO 108/45 mean 74 LV 115/17   Coronary angiography: Coronary dominance: right  Left mainstem: Calcified, diffuse nonobstructive disease, 30-40% distal stenosis  Left anterior descending (LAD): Heavily calcified with severe 80% proximal stenosis, fills from LIMA  Left circumflex (LCx): Total occlusion in mid vess just beyond OM1 origin. 70% proximal stenosis. OM1 patent, OM fills from left-left collaterals. AV circ beyond OM2 is severely diseased  Right coronary artery (RCA): Total proximal occlusion  SVG - Ramus Intermedius is widely patent. Native ramus is large without significant disease. Fills PL branches from left-left collaterals  SVG - RCA: occluded in proximal body of graft  LIMA - LAD: patent throughout. Fills right PDA via collaterals  Left ventriculography: Left ventricular systolic function is hyperdynamic with marked apical hypertrophy. LVEF estimated at 65-70%.  Final Conclusions:   1. Severe 3 vessel CAD 2. S/P CABG with continued patency of the  LIMA-LAD and SVG-ramus intermedius 3. Occluded SVG-RCA 4. Hypertrophic LV with preserved systolic function and normal diastolic filling pressure  Recommendations: Medical Rx, risk reduction  [name] 03/21/2012, 8:37 AM

## 2012-03-21 NOTE — OR Nursing (Signed)
Tegaderm dressing applied, site level 0, bedrest begins at [place]

## 2012-03-21 NOTE — OR Nursing (Signed)
Discharge instructions reviewed and signed, pt stated understanding, ambulated in hall without difficulty, site level 0, transported to wife's car via wheelchair 

## 2012-03-21 NOTE — OR Nursing (Signed)
Meal served 

## 2012-03-21 NOTE — OR Nursing (Signed)
Dr Cooper at bedside to discuss results and treatment plan with pt and family 

## 2012-03-28 ENCOUNTER — Other Ambulatory Visit: Payer: Self-pay | Admitting: Cardiology

## 2012-03-29 ENCOUNTER — Ambulatory Visit: Payer: Medicare Other | Admitting: Cardiology

## 2012-03-30 ENCOUNTER — Telehealth: Payer: Self-pay | Admitting: Adult Health

## 2012-03-30 NOTE — Telephone Encounter (Signed)
Patient called and stated needs to talk to [facility] regarding the heart cath that was done.  He indicated that there was an issue with his hernia "screen" during the cath procedure.

## 2012-03-30 NOTE — Telephone Encounter (Signed)
After speaking with the patient, he appears to be having difficulty understanding the results of his cardiac catherization and the mechanism of how they were able to visualize his arteries in his heart from his groin.  Attempted to give him basic information, however will have Curt Bears discuss further at follow up appointment on 1/14.

## 2012-04-04 ENCOUNTER — Other Ambulatory Visit (HOSPITAL_COMMUNITY): Payer: Medicare Other

## 2012-04-05 ENCOUNTER — Ambulatory Visit: Payer: Medicare Other | Admitting: Adult Health

## 2012-04-05 ENCOUNTER — Encounter: Payer: Self-pay | Admitting: Adult Health

## 2012-04-05 ENCOUNTER — Ambulatory Visit (INDEPENDENT_AMBULATORY_CARE_PROVIDER_SITE_OTHER): Payer: Medicare Other | Admitting: Adult Health

## 2012-04-05 VITALS — BP 102/58 | HR 63 | Ht 65.5 in | Wt 193.1 lb

## 2012-04-05 DIAGNOSIS — I10 Essential (primary) hypertension: Secondary | ICD-10-CM

## 2012-04-05 DIAGNOSIS — F17200 Nicotine dependence, unspecified, uncomplicated: Secondary | ICD-10-CM

## 2012-04-05 DIAGNOSIS — I2510 Atherosclerotic heart disease of native coronary artery without angina pectoris: Secondary | ICD-10-CM

## 2012-04-05 NOTE — Assessment & Plan Note (Signed)
Further discussion on the need to quit. He will need to be evaluated by pulmonologist for ongoing assessment for lung function and need for supplemental oxygen if necessary. Per patient, he is unable to tolerate CPAP. Will defer to PCP

## 2012-04-05 NOTE — Progress Notes (Deleted)
Name: Donald Gaines    DOB: 1940-09-18  Age: 72 y.o.  MR#: [id number]       PCP:  Wende Neighbors, MD      Insurance: @PAYORNAME @   CC:    Chief Complaint  Patient presents with  . Follow-up    Still occassional chest pains and SOB, but not as serve as before    VS BP 102/58  Pulse 63  Ht 5' 5.5" (1.664 m)  Wt 193 lb 1.9 oz (87.599 kg)  BMI 31.65 kg/m2  SpO2 95%  Weights Current Weight  04/05/12 193 lb 1.9 oz (87.599 kg)  03/21/12 193 lb (87.544 kg)  03/21/12 193 lb (87.544 kg)    Blood Pressure  BP Readings from Last 3 Encounters:  04/05/12 102/58  03/21/12 118/60  03/21/12 118/60     Admit date:  (Not on file) Last encounter with RMR:  03/30/2012   Allergy Allergies  Allergen Reactions  . Penicillins Other (See Comments)    Unknown reaction    Current Outpatient Prescriptions  Medication Sig Dispense Refill  . Aclidinium Bromide (TUDORZA PRESSAIR) 400 MCG/ACT AEPB Inhale 400 mcg into the lungs daily.      Marland Kitchen ADVAIR DISKUS 250-50 MCG/DOSE AEPB Inhale 1 puff into the lungs 2 (two) times daily.       . Albuterol Sulfate (PROAIR HFA IN) Inhale 2 puffs into the lungs 3 (three) times daily.       Marland Kitchen ALPRAZolam (XANAX) 0.5 MG tablet Take 0.5 mg by mouth at bedtime as needed.        . carvedilol (COREG) 6.25 MG tablet Take 6.25 mg by mouth 2 (two) times daily with a meal.       . cetirizine (ZYRTEC) 10 MG tablet Take 10 mg by mouth daily.        . clopidogrel (PLAVIX) 75 MG tablet Take 1 tablet (75 mg total) by mouth daily.  30 tablet  8  . clopidogrel (PLAVIX) 75 MG tablet TAKE 1 TABLET BY MOUTH ONCE DAILY  30 tablet  2  . CRESTOR 20 MG tablet TAKE 1 TABLET BY MOUTH ONCE DAILY  30 tablet  1  . dutasteride (AVODART) 0.5 MG capsule Take 0.5 mg by mouth daily.       Marland Kitchen Fesoterodine Fumarate (TOVIAZ) 8 MG TB24 Take 8 mg by mouth daily.       Marland Kitchen FLUoxetine (PROZAC) 20 MG capsule TAKE 1 CAPSULE TWICE A DAY  60 capsule  3  . fluticasone (FLONASE) 50 MCG/ACT nasal spray as needed.        Marland Kitchen FLUZONE HIGH-DOSE SUSP       . furosemide (LASIX) 40 MG tablet Take 40 mg by mouth daily.       Marland Kitchen glimepiride (AMARYL) 2 MG tablet Take 2 mg by mouth daily before breakfast.        . isosorbide mononitrate (IMDUR) 60 MG 24 hr tablet TAKE 1 TABLET EVERY DAY  30 tablet  6  . losartan (COZAAR) 50 MG tablet Take 50 mg by mouth daily.       . Memantine HCl ER (NAMENDA XR) 28 MG CP24 Take 28 mg by mouth daily.      Marland Kitchen MYRBETRIQ 25 MG TB24 25 mg daily.       Marland Kitchen NITROSTAT 0.4 MG SL tablet Place 0.4 mg under the tongue every 5 (five) minutes as needed.       . NON FORMULARY dIGESTIVE aDVANTAGE (LACTOSE DEFENSE FORMULA) DAILY      .  omeprazole (PRILOSEC) 20 MG capsule Take 20 mg by mouth 2 (two) times daily. 1 30 MIN BEFORE MEALS TWICE DAILY      . Probiotic Product (ALIGN) 4 MG CAPS Take 4 mg by mouth as needed.       . rosuvastatin (CRESTOR) 20 MG tablet Take 1 tablet (20 mg total) by mouth daily.  30 tablet  8  . sitaGLIPtan-metformin (JANUMET) 50-1000 MG per tablet Take 1 tablet by mouth 2 (two) times daily with a meal.        . Tamsulosin HCl (FLOMAX) 0.4 MG CAPS Take 0.4 mg by mouth daily. Takes in AM        Discontinued Meds:   There are no discontinued medications.  Patient Active Problem List  Diagnosis  . COLONIC POLYPS, ADENOMATOUS  . DIABETES MELLITUS  . ANEMIA-IRON DEFICIENCY  . TOBACCO ABUSE  . CORONARY ATHEROSCLEROSIS NATIVE CORONARY ARTERY  . HEMORRHOIDS, INTERNAL  . EOSINOPHILIC ESOPHAGITIS  . GERD  . CONSTIPATION  . IBS  . FATTY LIVER DISEASE  . ARTERIOVENOUS MALFORMATION  . DIARRHEA  . Essential hypertension, benign  . Mixed hyperlipidemia    LABS Admission on 03/21/2012, Discharged on 03/21/2012  Component Date Value  . Operator id 03/21/2012 [id number]   . Glucose, Bld 03/21/2012 97   Office Visit on 03/17/2012  Component Date Value  . Prothrombin Time 03/17/2012 12.9   . INR 03/17/2012 0.98   . aPTT 03/17/2012 38.3*  . WBC 03/17/2012 6.2   . RBC 03/17/2012  3.91*  . Hemoglobin 03/17/2012 12.1*  . HCT 03/17/2012 36.7*  . MCV 03/17/2012 93.9   . [facility] 03/17/2012 30.9   . MCHC 03/17/2012 33.0   . RDW 03/17/2012 15.3   . Platelets 03/17/2012 161   . Sodium 03/17/2012 137   . Potassium 03/17/2012 3.7   . Chloride 03/17/2012 100   . CO2 03/17/2012 30   . Glucose, Bld 03/17/2012 124*  . BUN 03/17/2012 13   . Creat 03/17/2012 1.09   . Calcium 03/17/2012 9.6   Infusion on 03/02/2012  Component Date Value  . WBC 03/02/2012 6.5   . RBC 03/02/2012 4.14*  . Hemoglobin 03/02/2012 12.7*  . HCT 03/02/2012 38.7*  . MCV 03/02/2012 93.5   . [facility] 03/02/2012 30.7   . MCHC 03/02/2012 32.8   . RDW 03/02/2012 15.9*  . Platelets 03/02/2012 174   . Ferritin 03/02/2012 258   . Iron 03/02/2012 66   . TIBC 03/02/2012 310   . Saturation Ratios 03/02/2012 21   . UIBC 03/02/2012 244   Infusion on 01/29/2012  Component Date Value  . WBC 01/29/2012 5.2   . RBC 01/29/2012 3.91*  . Hemoglobin 01/29/2012 11.6*  . HCT 01/29/2012 35.1*  . MCV 01/29/2012 89.8   . [facility] 01/29/2012 29.7   . MCHC 01/29/2012 33.0   . RDW 01/29/2012 16.1*  . Platelets 01/29/2012 185   . Iron 01/29/2012 38*  . TIBC 01/29/2012 309   . Saturation Ratios 01/29/2012 12*  . UIBC 01/29/2012 271   . Ferritin 01/29/2012 52   . Neutrophils Relative 01/29/2012 78*  . Neutro Abs 01/29/2012 4.1   . Lymphocytes Relative 01/29/2012 15   . Lymphs Abs 01/29/2012 0.8   . Monocytes Relative 01/29/2012 5   . Monocytes Absolute 01/29/2012 0.3   . Eosinophils Relative 01/29/2012 1   . Eosinophils Absolute 01/29/2012 0.1   . Basophils Relative 01/29/2012 1   . Basophils Absolute 01/29/2012 0.0   . Sodium 01/29/2012 137   .  Potassium 01/29/2012 3.5   . Chloride 01/29/2012 103   . CO2 01/29/2012 25   . Glucose, Bld 01/29/2012 85   . BUN 01/29/2012 13   . Creatinine, Ser 01/29/2012 0.97   . Calcium 01/29/2012 9.5   . Total Protein 01/29/2012 7.1   . Albumin 01/29/2012 3.9   . AST 01/29/2012 17    . ALT 01/29/2012 9   . Alkaline Phosphatase 01/29/2012 79   . Total Bilirubin 01/29/2012 0.3   . GFR calc non Af Amer 01/29/2012 82*  . GFR calc Af Amer 01/29/2012 >90      Results for this Opt Visit:     Results for orders placed during the hospital encounter of 03/21/12  POCT I-STAT GLUCOSE      Component Value Range   Operator id [id number]     Glucose, Bld 97  70 - 99 mg/dL    EKG Orders placed in visit on 04/05/12  . EKG 12-LEAD     Prior Assessment and Plan Problem List as of 04/05/2012            Cardiology Problems   CORONARY ATHEROSCLEROSIS NATIVE CORONARY ARTERY   Last Assessment & Plan Note   03/17/2012 Office Visit Addendum 03/17/2012  4:48 PM by Lendon Colonel, NP    This is a difficult situation in patient with known CAD, CABG and stents X 3 with AV malformation which would be precarious for use of DAPT if PCI is needed. However, he is having daily episodes of what appears to be unstable angina with use of NTG in addition to daily Imdur at 60 mg. I considered repeating stress test, but last one was abnormal with his previous MI and scarring and therefore cardiac catheterization is the better option.   I have called and spoken with Dr. Domenic Polite, as he is this patient's primary cardiologist to discuss the need to proceed with cath. Dr. Domenic Polite reviewed the patient's chart remotely from [facility] and agreed that catheterization is warranted. Post procedure recommendations will be discussed by interventionalist in the difficult situation of AV malformation with increased bleeding issues if ASA is used. Possible revascularization options may be considered once full evaluation is completed.    This has been discussed with the patient and his wife in the clinic area. They agree to proceed with cardiac cath. It has been scheduled with Dr. [name] on Monday, March 21, 2012. He has been advised that if he has recurrent severe chest pain, he is to call EMS and be  transported to [facility], even though cath has been scheduled. He and his wife verbalize understanding.    HEMORRHOIDS, INTERNAL   ARTERIOVENOUS MALFORMATION   Last Assessment & Plan Note   03/17/2012 Office Visit Signed 03/17/2012  4:30 PM by Lendon Colonel, NP    Followed by Dr. Barney Drain in [place]. No complaints of abdominal pain or bleeding.     Essential hypertension, benign   Last Assessment & Plan Note   03/17/2012 Office Visit Signed 03/17/2012  4:32 PM by Lendon Colonel, NP    Currently well controlled on losartan 50 mg daily and nitrates.    Mixed hyperlipidemia   Last Assessment & Plan Note   07/08/2011 Office Visit Signed 07/08/2011  1:32 PM by Satira Sark, MD    Continues on statin therapy.      Other   COLONIC POLYPS, ADENOMATOUS   DIABETES MELLITUS   ANEMIA-IRON DEFICIENCY   Last Assessment & Plan Note  03/17/2012 Office Visit Signed 03/17/2012  4:29 PM by Lendon Colonel, NP    Most recent labs completed on 03/02/2012 Hgb 12.7. Hct 38.7. Platelets 174. Followed by Dr. Lexine Baton.     TOBACCO ABUSE   Last Assessment & Plan Note   03/17/2012 Office Visit Signed 03/17/2012  4:30 PM by Lendon Colonel, NP    He stopped smoking approximately 11 days ago. Encouraged to continue cessation.    EOSINOPHILIC ESOPHAGITIS   GERD   Last Assessment & Plan Note   01/19/2011 Office Visit Signed 01/20/2011  1:30 PM by Andria Meuse, NP    Controlled on PPI     CONSTIPATION   IBS   FATTY LIVER DISEASE   DIARRHEA   Last Assessment & Plan Note   03/05/2011 Office Visit Signed 03/05/2011 10:29 AM by Danie Binder, MD    MOST LIKELY 2O TO DIABETIC ENTEROPATHY & IBS. Sx improved with imodium and align.  CONTINUE IMODIUM. CONTINUE ALIGN OR [employer] PROBIOTIC EVERY DAY. FOLLOW UP IN 4 MOS.        Imaging: Dg Chest 2 View  03/17/2012  *RADIOLOGY REPORT*  Clinical Data: Pre procedure  CHEST - 2 VIEW  Comparison: Chest radiograph 09/17/2011and  chest CT 10/03/2008  Findings: There are changes of median sternotomy for CABG. Cardiomegaly is stable.  Probable coronary artery stent projects over the right aspect of the heart. There is mild chronic interstitial prominence of the lung bases.  No superimposed airspace disease or pleural effusion.  The lungs are mildly hyperinflated.  IMPRESSION:  1.  Mild hyperinflation and chronic mild interstitial prominence. No acute cardiopulmonary disease identified. 2.  Stable cardiomegaly status post CABG and probable coronary artery stent placement   Original Report Authenticated By: Curlene Dolphin, M.D.      [facility] Calculation: Score not calculated. Missing: Total Cholesterol

## 2012-04-05 NOTE — Progress Notes (Signed)
HPIMr. Clemenson is a medically complicated 72 y/o patient of Dr. Domenic Polite we are seeing for ongoing assessment and treatment of known CAD, with CABG, BMS to SVG to RCA in 2000, two more subsequent stents to unknown arteries, completed by cardiologist Dr. Truett Mainland, in [facility]; Bilateral carotid artery stenosis, hypertension, hyperlipidemia, history of AV malformation (followed by Dr.Fields in [place]) on Plavix only, diabetes, and other medical issues (please review history section).     He came to last appointment  with recurrent chest pain at rest he had severe discomfort in his chest, beginning on the right side, then substernal radiating to both the right and left side with associated dyspnea and palpitations, fluttering.  Episode lasted a little over an hour. He did not seek medical attention. He quit smoking right after this episode.   I sent him for cardiac catheterization after discussion with primary cardiologist, Dr. Domenic Polite. Cardiac cath performed by Dr. Burt Knack demonstrated severe 3 vessel disease, s/p CABG with continued patency of the LIMA-LAD and SVG to Ramus, occluded SVG to RCA, hypertrophic LV with preserved systolic function and normal diastolic filling pressures. Medical management was recommended.   He comes today without further complaints with the exception of continued DOE. He is followed by Dr. [place] Crane and has been diagnosed with OSA, but is unable to tolerate CPAP. He states he was recommended for supplemental O2 but did not receive this. He has not seen Dr. [place] Crane since cardiac cath. He unfortunately continues to smoke, 3 cigarettes a week.    Allergies  Allergen Reactions  . Penicillins Other (See Comments)    Unknown reaction    Current Outpatient Prescriptions  Medication Sig Dispense Refill  . Aclidinium Bromide (TUDORZA PRESSAIR) 400 MCG/ACT AEPB Inhale 400 mcg into the lungs daily.      Marland Kitchen ADVAIR DISKUS 250-50 MCG/DOSE AEPB Inhale 1 puff into the lungs 2  (two) times daily.       . Albuterol Sulfate (PROAIR HFA IN) Inhale 2 puffs into the lungs 3 (three) times daily.       Marland Kitchen ALPRAZolam (XANAX) 0.5 MG tablet Take 0.5 mg by mouth at bedtime as needed.        . carvedilol (COREG) 6.25 MG tablet Take 6.25 mg by mouth 2 (two) times daily with a meal.       . cetirizine (ZYRTEC) 10 MG tablet Take 10 mg by mouth daily.        . clopidogrel (PLAVIX) 75 MG tablet Take 1 tablet (75 mg total) by mouth daily.  30 tablet  8  . clopidogrel (PLAVIX) 75 MG tablet TAKE 1 TABLET BY MOUTH ONCE DAILY  30 tablet  2  . CRESTOR 20 MG tablet TAKE 1 TABLET BY MOUTH ONCE DAILY  30 tablet  1  . dutasteride (AVODART) 0.5 MG capsule Take 0.5 mg by mouth daily.       Marland Kitchen Fesoterodine Fumarate (TOVIAZ) 8 MG TB24 Take 8 mg by mouth daily.       Marland Kitchen FLUoxetine (PROZAC) 20 MG capsule TAKE 1 CAPSULE TWICE A DAY  60 capsule  3  . fluticasone (FLONASE) 50 MCG/ACT nasal spray as needed.       Marland Kitchen FLUZONE HIGH-DOSE SUSP       . furosemide (LASIX) 40 MG tablet Take 40 mg by mouth daily.       Marland Kitchen glimepiride (AMARYL) 2 MG tablet Take 2 mg by mouth daily before breakfast.        . isosorbide  mononitrate (IMDUR) 60 MG 24 hr tablet TAKE 1 TABLET EVERY DAY  30 tablet  6  . losartan (COZAAR) 50 MG tablet Take 50 mg by mouth daily.       . Memantine HCl ER (NAMENDA XR) 28 MG CP24 Take 28 mg by mouth daily.      Marland Kitchen MYRBETRIQ 25 MG TB24 25 mg daily.       Marland Kitchen NITROSTAT 0.4 MG SL tablet Place 0.4 mg under the tongue every 5 (five) minutes as needed.       . NON FORMULARY dIGESTIVE aDVANTAGE (LACTOSE DEFENSE FORMULA) DAILY      . omeprazole (PRILOSEC) 20 MG capsule Take 20 mg by mouth 2 (two) times daily. 1 30 MIN BEFORE MEALS TWICE DAILY      . Probiotic Product (ALIGN) 4 MG CAPS Take 4 mg by mouth as needed.       . rosuvastatin (CRESTOR) 20 MG tablet Take 1 tablet (20 mg total) by mouth daily.  30 tablet  8  . sitaGLIPtan-metformin (JANUMET) 50-1000 MG per tablet Take 1 tablet by mouth 2 (two) times  daily with a meal.        . Tamsulosin HCl (FLOMAX) 0.4 MG CAPS Take 0.4 mg by mouth daily. Takes in AM        Past Medical History  Diagnosis Date  . TIA (transient ischemic attack)     2005  . GERD (gastroesophageal reflux disease)   . Hyperlipidemia   . Hypertension   . Depression   . Diabetes mellitus, type 2   . Restrictive lung disease   . Candida esophagitis   . Hemorrhoids   . Arteriovenous malformation small bowel     Capsule study 3/10  . GI bleeding     Cecal ulcers, arteriovenous malformations  . Chronic diarrhea SEP 2011    ?ETIOLOGY-DIABETIC ENTEROPATHY, LACTOSE INTOLERANCE,  OR IBS-MIXED  . Bloating AUG 2009    NL HBT FOR SIBO  . Anemia FEB 2008 FEDA 2o to AVMs, & large colon ulcers    HB 10.7 MCV 74.3; Dr Tressie Stalker  . BPH (benign prostatic hypertrophy)     Dr [place] Pink  . Glaucoma(365)   . Cataract   . ASCVD (arteriosclerotic cardiovascular disease)     LIMA to LAD and LIMA to intermediate branch, SVG to PD and second OM. 06/03/93  . Bilateral carotid artery stenosis     50-75% external left carotid stenosis  . Coronary atherosclerosis of native coronary artery     Multivessel, BMS SVG TO RCA 2000, reportedly  "2" additional stents in interim    Past Surgical History  Procedure Date  . Esophagogastroduodenoscopy SEP 09    DUODENAL LIPOMA  . Capsule endo 03/10  . Right inguinal hernia repair   . Hydrogen breath test 2009  . Colonoscopy APR 2008    SIMPLE ADENOMA, [place] TICS, IH  . Colonoscopy FEB 2008 ANEMIA. MELENA     2 LARGE ILEOCEAL ULCERS 2o to ASA, [place] TICS, IH  . Colonoscopy SEP 2009 TRANSFUSION DEP ANEMIA    AC AVM-ABLATED,  TICS, IH  . Upper gastrointestinal endoscopy SEP 2009    GASTRIC AVM ABLATED, NL DUO Bx  . Coronary artery bypass graft 1995    LIMA-LAD, LIMA to intermediate branch, SVG to PD and second OM,  . Cardiac catheterization 04/05/1996 [facility]    EF 45%, occluded SVG to circumflex, patent SVG to D1 and PDA and patent LIMA  to LAD with severe native vessel  disease.  . Cardiac catheterization 08/22/1998 [facility]    Mild LM, severe LAD,severe CX, occlude RCA, patient  SVG to diatal RCA, patent vein graft to D1 and patent LIMA to LAD.   Marland Kitchen Coronary angioplasty with stent placement 08/22/1998 [name]    TEC stenting of the SVG to RCA-Last seen by cardiologist in 2010.    [id number] of systems complete and found to be negative unless listed above  PHYSICAL EXAM BP 102/58  Pulse 63  Ht 5' 5.5" (1.664 m)  Wt 193 lb 1.9 oz (87.599 kg)  BMI 31.65 kg/m2  SpO2 95%  EKG: NSR with PVC's  ASSESSMENT AND PLAN

## 2012-04-05 NOTE — Assessment & Plan Note (Signed)
Review of cardiac cath results discussed with the patient. He has collateral flow noted around occlusion of the SVG and Ramus. Will continue to treat him with medical therapy to include ongoing use of ASA, plavix, statin, imdur, and coreg. He is advised to stop smoking completely. As cath report is reassuring, I recommend follow up with PCP or even pulmonologist for ongoing evaluation of lung disease causing his symptoms. The patient verbalizes understanding is will to continue risk management and discuss other options.

## 2012-04-05 NOTE — Assessment & Plan Note (Signed)
Excellent control of BP presently. Even on the low normal side. He denies dizziness or near syncope. No changes at this time.

## 2012-04-05 NOTE — Patient Instructions (Addendum)
Your physician recommends that you schedule a follow-up appointment in: 6 months  

## 2012-04-23 HISTORY — PX: OTHER SURGICAL HISTORY: SHX169

## 2012-05-06 ENCOUNTER — Other Ambulatory Visit: Payer: Self-pay

## 2012-05-06 DIAGNOSIS — G4700 Insomnia, unspecified: Secondary | ICD-10-CM

## 2012-05-08 ENCOUNTER — Other Ambulatory Visit: Payer: Self-pay | Admitting: Cardiology

## 2012-05-09 ENCOUNTER — Ambulatory Visit: Payer: Medicare Other | Attending: Neurology | Admitting: Sleep Medicine

## 2012-05-09 DIAGNOSIS — Z6830 Body mass index (BMI) 30.0-30.9, adult: Secondary | ICD-10-CM | POA: Insufficient documentation

## 2012-05-09 DIAGNOSIS — G4700 Insomnia, unspecified: Secondary | ICD-10-CM

## 2012-05-09 DIAGNOSIS — G4733 Obstructive sleep apnea (adult) (pediatric): Secondary | ICD-10-CM | POA: Insufficient documentation

## 2012-05-13 NOTE — Procedures (Signed)
Williams A. Merlene Laughter, MD     [email or web address]        NAME:  DEVARI, KETTLEWELL              ACCOUNT NO.:  [id number]  MEDICAL RECORD NO.:  [id number]          PATIENT TYPE:  OUT  LOCATION:  SLEEP LAB                     FACILITY:  [facility]  PHYSICIAN:  Yotam Rhine A. Merlene Laughter, M.D. DATE OF BIRTH:  [birth year]  DATE OF STUDY:  05/09/2012                           NOCTURNAL POLYSOMNOGRAM  REFERRING PHYSICIAN:  Kelechi Orgeron A. Merlene Laughter, M.D.  INDICATION:  This is a 72 year old who presents with hypersomnia, fatigue, and snoring.   MEDICATIONS:  Losartan, alprazolam, hydrocodone, Prozac, Zyrtec, Coreg, Lasix, Nitrostat, albuterol, Flomax, and Imdur.  EPWORTH SLEEPINESS SCALE:  9.  BMI 30.  ARCHITECTURAL SUMMARY:  This is a split night recording with initial portion being a diagnostic and the second portion a titration recording. The total recording time is 406 minutes.  Sleep efficiency 53%.  Sleep latency 66 minutes.  REM latency 216 minutes.  RESPIRATORY SUMMARY:  Baseline oxygen saturation 90, lowest saturation 83 during non-REM sleep.  Diagnostic AHI is 84.  The patient was started on positive pressure at 5 and titrated to a pressure of 15.  Optimal pressure is 14 with resolution of obstructive events and good tolerance.  LIMB MOVEMENT SUMMARY:  PLM index 0.  ELECTROCARDIOGRAM SUMMARY:  Average heart rate is 62 with no significant dysrhythmias observed.  IMPRESSION:  Severe obstructive sleep apnea syndrome, which responds well to a CPAP of 14.   Courtenay Hirth A. Merlene Laughter, M.D.    KAD/MEDQ  D:  05/13/2012 10:25:29  T:  05/13/2012 10:45:29  Job:  [id number]

## 2012-05-31 ENCOUNTER — Other Ambulatory Visit (HOSPITAL_COMMUNITY): Payer: Medicare Other

## 2012-06-01 ENCOUNTER — Other Ambulatory Visit (HOSPITAL_COMMUNITY): Payer: Medicare Other

## 2012-06-02 ENCOUNTER — Ambulatory Visit (HOSPITAL_COMMUNITY): Payer: Medicare Other | Admitting: Oncology

## 2012-06-06 ENCOUNTER — Other Ambulatory Visit (HOSPITAL_COMMUNITY): Payer: Self-pay | Admitting: Oncology

## 2012-06-16 ENCOUNTER — Encounter (HOSPITAL_COMMUNITY): Payer: Self-pay | Admitting: Oncology

## 2012-06-16 ENCOUNTER — Encounter (HOSPITAL_COMMUNITY): Payer: Medicare Other | Attending: Oncology | Admitting: Oncology

## 2012-06-16 VITALS — BP 108/58 | HR 66 | Temp 97.2°F | Resp 18 | Wt 189.2 lb

## 2012-06-16 DIAGNOSIS — F0390 Unspecified dementia without behavioral disturbance: Secondary | ICD-10-CM

## 2012-06-16 DIAGNOSIS — D509 Iron deficiency anemia, unspecified: Secondary | ICD-10-CM

## 2012-06-16 DIAGNOSIS — E119 Type 2 diabetes mellitus without complications: Secondary | ICD-10-CM

## 2012-06-16 DIAGNOSIS — J449 Chronic obstructive pulmonary disease, unspecified: Secondary | ICD-10-CM

## 2012-06-16 NOTE — Progress Notes (Signed)
Wende Neighbors, [address] [place] [address]  ANEMIA-IRON DEFICIENCY - Plan: traMADol (ULTRAM) 50 MG tablet, Carboxymethylcellul-Glycerin (REFRESH OPTIVE OP), CBC, Ferritin, Iron and TIBC  CURRENT THERAPY: Feraheme IV as needed.  lAst infusion on 02/09/2012  INTERVAL HISTORY: ORVIE GOLDENSTEIN 72 y.o. male returns for  regular  visit for followup of Severe iron deficiency anemia with with AVMs with an excellent response to IV feraheme he still requires intermittently.    I personally reviewed and went over laboratory results with the patient.  The last labs we have in [facility] is from December. He had labs performed by Dr. Wende Neighbors 2 weeks ago.  These were faxed to [place].  I reviewed the labs and if my recollection serves me correctly, everything was looking good from a lab standpoint.  I will see if Dr. Nevada Crane can fax me another copy to refresh my memory. The labs I reviewed were sent to [facility] for scanning into [facility] and they are not in the system yet.   Generoso has been having increased cardiac issues.  He underwent a cardiac cath on 03/21/12.  The impression from that follow: 1. Severe 3 vessel CAD  2. S/P CABG with continued patency of the LIMA-LAD and SVG-ramus intermedius  3. Occluded SVG-RCA  4. Hypertrophic LV with preserved systolic function and normal diastolic filling pressure Recommendations: Medical Rx, risk reduction  Daljit continues to smoke 1 ppd.  Smoking cessation education was provided.   Hematologically, he denies any complaints and ROS questioning is negative.   Past Medical History  Diagnosis Date  . TIA (transient ischemic attack)     2005  . GERD (gastroesophageal reflux disease)   . Hyperlipidemia   . Hypertension   . Depression   . Diabetes mellitus, type 2   . Restrictive lung disease   . Candida esophagitis   . Hemorrhoids   . Arteriovenous malformation small bowel     Capsule study 3/10  . GI bleeding     Cecal ulcers, arteriovenous malformations   . Chronic diarrhea SEP 2011    ?ETIOLOGY-DIABETIC ENTEROPATHY, LACTOSE INTOLERANCE,  OR IBS-MIXED  . Bloating AUG 2009    NL HBT FOR SIBO  . Anemia FEB 2008 FEDA 2o to AVMs, & large colon ulcers    HB 10.7 MCV 74.3; Dr Tressie Stalker  . BPH (benign prostatic hypertrophy)     Dr [place] Pink  . Glaucoma(365)   . Cataract   . ASCVD (arteriosclerotic cardiovascular disease)     LIMA to LAD and LIMA to intermediate branch, SVG to PD and second OM. 06/03/93  . Bilateral carotid artery stenosis     50-75% external left carotid stenosis  . Coronary atherosclerosis of native coronary artery     Multivessel, BMS SVG TO RCA 2000, reportedly  "2" additional stents in interim    has COLONIC POLYPS, ADENOMATOUS; DIABETES MELLITUS; ANEMIA-IRON DEFICIENCY; TOBACCO ABUSE; CORONARY ATHEROSCLEROSIS NATIVE CORONARY ARTERY; HEMORRHOIDS, INTERNAL; EOSINOPHILIC ESOPHAGITIS; GERD; CONSTIPATION; IBS; FATTY LIVER DISEASE; ARTERIOVENOUS MALFORMATION; DIARRHEA; Essential hypertension, benign; and Mixed hyperlipidemia on his problem list.     is allergic to penicillins.  Mr. Louallen had no medications administered during this visit.  Past Surgical History  Procedure Laterality Date  . Esophagogastroduodenoscopy  SEP 09    DUODENAL LIPOMA  . Capsule endo  03/10  . Right inguinal hernia repair    . Hydrogen breath test  2009  . Colonoscopy  APR 2008    SIMPLE ADENOMA, [place] TICS, IH  . Colonoscopy  FEB 2008 ANEMIA. MELENA     2 LARGE ILEOCEAL ULCERS 2o to ASA, [place] TICS, IH  . Colonoscopy  SEP 2009 TRANSFUSION DEP ANEMIA    AC AVM-ABLATED, [place] TICS, IH  . Upper gastrointestinal endoscopy  SEP 2009    GASTRIC AVM ABLATED, NL DUO Bx  . Coronary artery bypass graft  1995    LIMA-LAD, LIMA to intermediate branch, SVG to PD and second OM,  . Cardiac catheterization  04/05/1996 [facility]    EF 45%, occluded SVG to circumflex, patent SVG to D1 and PDA and patent LIMA to LAD with severe native vessel disease.  . Cardiac  catheterization  08/22/1998 [facility]    Mild LM, severe LAD,severe CX, occlude RCA, patient  SVG to diatal RCA, patent vein graft to D1 and patent LIMA to LAD.   Marland Kitchen Coronary angioplasty with stent placement  08/22/1998 [name]    TEC stenting of the SVG to RCA-Last seen by cardiologist in 2010.  . Cardiac catherization  04/2012    Denies any headaches, dizziness, double vision, fevers, chills, night sweats, nausea, vomiting, diarrhea, constipation, chest pain, heart palpitations, shortness of breath, blood in stool, black tarry stool, urinary pain, urinary burning, urinary frequency, hematuria.   PHYSICAL EXAMINATION  ECOG PERFORMANCE STATUS: 1 - Symptomatic but completely ambulatory  Filed Vitals:   06/16/12 1100  BP: 108/58  Pulse: 66  Temp: 97.2 F (36.2 C)  Resp: 18    GENERAL:alert, no distress, comfortable, cooperative, obese, smiling and chronically ill appearing SKIN: skin color, texture, turgor are normal, no rashes or significant lesions HEAD: Normocephalic, No masses, lesions, tenderness or abnormalities EYES: normal EARS: External ears normal OROPHARYNX:edentulous and mucous membranes are moist  NECK: supple, trachea midline LYMPH:  no palpable lymphadenopathy BREAST:not examined LUNGS: clear to auscultation, decreased breath sounds  HEART: regular rate & rhythm, no murmurs, no gallops, S1 normal and S2 normal ABDOMEN:abdomen soft, non-tender, obese, normal bowel sounds and no masses or organomegaly BACK: Back symmetric, no curvature., No CVA tenderness EXTREMITIES:less then 2 second capillary refill, no joint deformities, effusion, or inflammation, no edema, no cyanosis, positive findings:  Clubbing of fingernails with yellow of nail.  NEURO: alert & oriented x 3 with fluent speech, no focal motor/sensory deficits, gait normal   LABORATORY DATA: CBC    Component Value Date/Time   WBC 6.2 03/17/2012 1555   RBC 3.91* 03/17/2012 1555   HGB 12.1*  03/17/2012 1555   HCT 36.7* 03/17/2012 1555   PLT 161 03/17/2012 1555   MCV 93.9 03/17/2012 1555   MCH 30.9 03/17/2012 1555   MCHC 33.0 03/17/2012 1555   RDW 15.3 03/17/2012 1555   LYMPHSABS 0.8 01/29/2012 1153   MONOABS 0.3 01/29/2012 1153   EOSABS 0.1 01/29/2012 1153   BASOSABS 0.0 01/29/2012 1153      Chemistry      Component Value Date/Time   NA 137 03/17/2012 1555   K 3.7 03/17/2012 1555   CL 100 03/17/2012 1555   CO2 30 03/17/2012 1555   BUN 13 03/17/2012 1555   CREATININE 1.09 03/17/2012 1555   CREATININE 0.97 01/29/2012 1153      Component Value Date/Time   CALCIUM 9.6 03/17/2012 1555   ALKPHOS 79 01/29/2012 1153   ALKPHOS 96 01/27/2010   AST 17 01/29/2012 1153   AST 12 01/27/2010   ALT 9 01/29/2012 1153   BILITOT 0.3 01/29/2012 1153    . Lab Results  Component Value Date   IRON 66 03/02/2012  TIBC 310 03/02/2012   FERRITIN 258 03/02/2012      ASSESSMENT:  1. Severe iron deficiency anemia with an excellent response to IV feraheme he still requires intermittently.  2.  AV malformations in the small bowel and possibly large bowel with ablation in September 2009 and he continues to lose iron  3. Diabetes mellitus  4. Dementia  5.  COPD still smoking  6. Cocaine and marijuana use the past as well as excessive alcohol use in the past  7. History of stroke  8. Hypertension  9. Fatty liver  10. Bilateral carotid artery stenosis  11. Right orchiectomy in 2011 with impotence since then   PLAN:  1. I personally reviewed and went over laboratory results with the patient. 2. Labs performed by Dr. Wende Neighbors; they were reviewed and sent the [facility] for scanning, thus I do not have them readily available presently.  We will see if Dr. Nevada Crane can send [place] another copy.  3. Labs in 4 weeks and then every 6 weeks: CBC, ferritin, iron/TIBC 4. Recommend continued follow-up with cardiologist. 5. Smoking cessation education provided.  6. Will give Feraheme IV PRN 7. Return in  16 weeks  All questions were answered. The patient knows to call the clinic with any problems, questions or concerns. We can certainly see the patient much sooner if necessary.  Patient and plan will be discussed with Dr. Tressie Stalker within the next 24 hours.    Suleman Gunning

## 2012-06-16 NOTE — Patient Instructions (Addendum)
[facility] Discharge Instructions  RECOMMENDATIONS MADE BY THE CONSULTANT AND ANY TEST RESULTS WILL BE SENT TO YOUR REFERRING PHYSICIAN.  Lab work in 4 weeks from today. Then lab work every 6 weeks. Return to see MD in 4 months. Report any issues/concerns to clinic as needed.  Thank you for choosing [facility] to provide your oncology and hematology care.  To afford each patient quality time with our providers, please arrive at least 15 minutes before your scheduled appointment time.  With your help, our goal is to use those 15 minutes to complete the necessary work-up to ensure our physicians have the information they need to help with your evaluation and healthcare recommendations.    Effective January 1st, 2014, we ask that you re-schedule your appointment with our physicians should you arrive 10 or more minutes late for your appointment.  We strive to give you quality time with our providers, and arriving late affects you and other patients whose appointments are after yours.    Again, thank you for choosing [facility].  Our hope is that these requests will decrease the amount of time that you wait before being seen by our physicians.       _____________________________________________________________  Should you have questions after your visit to [facility], please contact our office at [phone] between the hours of 8:30 a.m. and 5:00 p.m.  Voicemails left after 4:30 p.m. will not be returned until the following business day.  For prescription refill requests, have your pharmacy contact our office with your prescription refill request.

## 2012-07-02 ENCOUNTER — Other Ambulatory Visit: Payer: Self-pay | Admitting: Cardiology

## 2012-08-31 ENCOUNTER — Telehealth: Payer: Self-pay | Admitting: *Deleted

## 2012-08-31 MED ORDER — ROSUVASTATIN CALCIUM 20 MG PO TABS: 20.0000 mg | ORAL_TABLET | Freq: Every day | ORAL | Status: DC

## 2012-08-31 NOTE — Telephone Encounter (Signed)
Medication sent via escribe.  

## 2012-09-07 ENCOUNTER — Encounter (HOSPITAL_COMMUNITY): Payer: Self-pay | Admitting: Pharmacy Technician

## 2012-09-11 ENCOUNTER — Other Ambulatory Visit: Payer: Self-pay | Admitting: Cardiology

## 2012-09-12 NOTE — Telephone Encounter (Signed)
Medication sent via escribe.  

## 2012-09-14 NOTE — Patient Instructions (Addendum)
Your procedure is scheduled on: 09/22/2012  Report to [facility] at 800 AM.  Call this number if you have problems the morning of surgery: [phone]   Do not eat food or drink liquids :After Midnight.      Take these medicines the morning of surgery with A SIP OF WATER: xanax, prozac, coreg, zyrtec, avodart, imdur, cozaar, prilosec,flomax, tramdol. Take your advair and proair before you come,   Do not wear jewelry, make-up or nail polish.  Do not wear lotions, powders, or perfumes.   Do not shave 48 hours prior to surgery.  Do not bring valuables to the hospital.  Contacts, dentures or bridgework may not be worn into surgery.  Leave suitcase in the car. After surgery it may be brought to your room.  For patients admitted to the hospital, checkout time is 11:00 AM the day of discharge.   Patients discharged the day of surgery will not be allowed to drive home.  :     Please read over the following fact sheets that you were given: Coughing and Deep Breathing, Surgical Site Infection Prevention, Anesthesia Post-op Instructions and Care and Recovery After Surgery    Cataract A cataract is a clouding of the lens of the eye. When a lens becomes cloudy, vision is reduced based on the degree and nature of the clouding. Many cataracts reduce vision to some degree. Some cataracts make people more near-sighted as they develop. Other cataracts increase glare. Cataracts that are ignored and become worse can sometimes look white. The white color can be seen through the pupil. CAUSES   Aging. However, cataracts may occur at any age, even in newborns.   Certain drugs.   Trauma to the eye.   Certain diseases such as diabetes.   Specific eye diseases such as chronic inflammation inside the eye or a sudden attack of a rare form of glaucoma.   Inherited or acquired medical problems.  SYMPTOMS   Gradual, progressive drop in vision in the affected eye.   Severe, rapid visual loss. This most often  happens when trauma is the cause.  DIAGNOSIS  To detect a cataract, an eye doctor examines the lens. Cataracts are best diagnosed with an exam of the eyes with the pupils enlarged (dilated) by drops.  TREATMENT  For an early cataract, vision may improve by using different eyeglasses or stronger lighting. If that does not help your vision, surgery is the only effective treatment. A cataract needs to be surgically removed when vision loss interferes with your everyday activities, such as driving, reading, or watching TV. A cataract may also have to be removed if it prevents examination or treatment of another eye problem. Surgery removes the cloudy lens and usually replaces it with a substitute lens (intraocular lens, IOL).  At a time when both you and your doctor agree, the cataract will be surgically removed. If you have cataracts in both eyes, only one is usually removed at a time. This allows the operated eye to heal and be out of danger from any possible problems after surgery (such as infection or poor wound healing). In rare cases, a cataract may be doing damage to your eye. In these cases, your caregiver may advise surgical removal right away. The vast majority of people who have cataract surgery have better vision afterward. HOME CARE INSTRUCTIONS  If you are not planning surgery, you may be asked to do the following:  Use different eyeglasses.   Use stronger or brighter lighting.  Ask your eye doctor about reducing your medicine dose or changing medicines if it is thought that a medicine caused your cataract. Changing medicines does not make the cataract go away on its own.   Become familiar with your surroundings. Poor vision can lead to injury. Avoid bumping into things on the affected side. You are at a higher risk for tripping or falling.   Exercise extreme care when driving or operating machinery.   Wear sunglasses if you are sensitive to bright light or experiencing problems with  glare.  SEEK IMMEDIATE MEDICAL CARE IF:   You have a worsening or sudden vision loss.   You notice redness, swelling, or increasing pain in the eye.   You have a fever.  Document Released: 03/09/2005 Document Revised: 02/26/2011 Document Reviewed: 10/31/2010 [facility] Patient Information 2012 [place].PATIENT INSTRUCTIONS POST-ANESTHESIA  IMMEDIATELY FOLLOWING SURGERY:  Do not drive or operate machinery for the first twenty four hours after surgery.  Do not make any important decisions for twenty four hours after surgery or while taking narcotic pain medications or sedatives.  If you develop intractable nausea and vomiting or a severe headache please notify your doctor immediately.  FOLLOW-UP:  Please make an appointment with your surgeon as instructed. You do not need to follow up with anesthesia unless specifically instructed to do so.  WOUND CARE INSTRUCTIONS (if applicable):  Keep a dry clean dressing on the anesthesia/puncture wound site if there is drainage.  Once the wound has quit draining you may leave it open to air.  Generally you should leave the bandage intact for twenty four hours unless there is drainage.  If the epidural site drains for more than 36-48 hours please call the anesthesia department.  QUESTIONS?:  Please feel free to call your physician or the hospital operator if you have any questions, and they will be happy to assist you.

## 2012-09-15 ENCOUNTER — Other Ambulatory Visit: Payer: Self-pay

## 2012-09-15 ENCOUNTER — Encounter (HOSPITAL_COMMUNITY): Payer: Self-pay | Admitting: Pharmacy Technician

## 2012-09-15 ENCOUNTER — Encounter (HOSPITAL_COMMUNITY): Payer: Self-pay

## 2012-09-15 ENCOUNTER — Encounter (HOSPITAL_COMMUNITY)
Admission: RE | Admit: 2012-09-15 | Discharge: 2012-09-15 | Disposition: A | Discharge status: Home / Self Care | Payer: Medicare Other | Source: Ambulatory Visit | Attending: Ophthalmology | Admitting: Ophthalmology

## 2012-09-15 HISTORY — DX: Chronic obstructive pulmonary disease, unspecified: J44.9

## 2012-09-15 HISTORY — DX: Acute myocardial infarction, unspecified: I21.9

## 2012-09-15 HISTORY — DX: Sleep apnea, unspecified: G47.30

## 2012-09-15 LAB — BASIC METABOLIC PANEL
Calcium: 9.4 mg/dL (ref 8.4–10.5)
GFR calc Af Amer: 75 mL/min — ABNORMAL LOW (ref >90)
GFR calc non Af Amer: 64 mL/min — ABNORMAL LOW (ref >90)
Potassium: 4.2 mEq/L (ref 3.5–5.1)
Sodium: 139 mEq/L (ref 135–145)

## 2012-09-15 LAB — HEMOGLOBIN AND HEMATOCRIT, BLOOD: HCT: 31.2 % — ABNORMAL LOW (ref 39.0–52.0)

## 2012-09-16 NOTE — Progress Notes (Signed)
Pt's pre-op labwork showed a H&H of 9.5 and 31.2. Pt has hx of anemia. Dr. Patsey Berthold made aware and he is ok with it, no orders given.

## 2012-09-21 MED ORDER — NEOMYCIN-POLYMYXIN-DEXAMETH 3.5-10000-0.1 OP OINT
TOPICAL_OINTMENT | OPHTHALMIC | Status: AC
  Filled 2012-09-21: qty 3.5

## 2012-09-21 MED ORDER — LIDOCAINE HCL 3.5 % OP GEL
OPHTHALMIC | Status: AC
  Filled 2012-09-21: qty 5

## 2012-09-21 MED ORDER — LIDOCAINE HCL (PF) 1 % IJ SOLN
INTRAMUSCULAR | Status: AC
  Filled 2012-09-21: qty 2

## 2012-09-21 MED ORDER — CYCLOPENTOLATE-PHENYLEPHRINE 0.2-1 % OP SOLN
OPHTHALMIC | Status: AC
  Filled 2012-09-21: qty 2

## 2012-09-21 MED ORDER — TETRACAINE HCL 0.5 % OP SOLN
OPHTHALMIC | Status: AC
  Filled 2012-09-21: qty 2

## 2012-09-22 ENCOUNTER — Encounter (HOSPITAL_COMMUNITY): Payer: Self-pay | Admitting: *Deleted

## 2012-09-22 ENCOUNTER — Ambulatory Visit (HOSPITAL_COMMUNITY): Payer: Medicare Other | Admitting: Anesthesiology

## 2012-09-22 ENCOUNTER — Encounter (HOSPITAL_COMMUNITY): Payer: Self-pay | Admitting: Anesthesiology

## 2012-09-22 ENCOUNTER — Ambulatory Visit (HOSPITAL_COMMUNITY)
Admission: RE | Admit: 2012-09-22 | Discharge: 2012-09-22 | Disposition: A | Discharge status: Home / Self Care | Payer: Medicare Other | Source: Ambulatory Visit | Attending: Ophthalmology | Admitting: Ophthalmology

## 2012-09-22 ENCOUNTER — Encounter (HOSPITAL_COMMUNITY)
Admission: RE | Disposition: A | Discharge status: Home / Self Care | Payer: Self-pay | Source: Ambulatory Visit | Attending: Ophthalmology

## 2012-09-22 DIAGNOSIS — H2181 Floppy iris syndrome: Secondary | ICD-10-CM | POA: Insufficient documentation

## 2012-09-22 DIAGNOSIS — Z01812 Encounter for preprocedural laboratory examination: Secondary | ICD-10-CM | POA: Insufficient documentation

## 2012-09-22 DIAGNOSIS — H2589 Other age-related cataract: Secondary | ICD-10-CM | POA: Insufficient documentation

## 2012-09-22 DIAGNOSIS — E119 Type 2 diabetes mellitus without complications: Secondary | ICD-10-CM | POA: Insufficient documentation

## 2012-09-22 DIAGNOSIS — J449 Chronic obstructive pulmonary disease, unspecified: Secondary | ICD-10-CM | POA: Insufficient documentation

## 2012-09-22 DIAGNOSIS — J4489 Other specified chronic obstructive pulmonary disease: Secondary | ICD-10-CM | POA: Insufficient documentation

## 2012-09-22 DIAGNOSIS — Z01810 Encounter for preprocedural cardiovascular examination: Secondary | ICD-10-CM | POA: Insufficient documentation

## 2012-09-22 DIAGNOSIS — I10 Essential (primary) hypertension: Secondary | ICD-10-CM | POA: Insufficient documentation

## 2012-09-22 HISTORY — PX: CATARACT EXTRACTION W/PHACO: SHX586

## 2012-09-22 SURGERY — PHACOEMULSIFICATION, CATARACT, WITH IOL INSERTION
Anesthesia: Monitor Anesthesia Care | Site: Eye | Laterality: Right | Wound class: Clean

## 2012-09-22 MED ORDER — TETRACAINE HCL 0.5 % OP SOLN
1.0000 [drp] | OPHTHALMIC | Status: AC
  Administered 2012-09-22 (×3): 1 [drp] via OPHTHALMIC

## 2012-09-22 MED ORDER — POVIDONE-IODINE 5 % OP SOLN
OPHTHALMIC | Status: DC | PRN
  Administered 2012-09-22: 1 via OPHTHALMIC

## 2012-09-22 MED ORDER — ONDANSETRON HCL 4 MG/2ML IJ SOLN: 4.0000 mg | Freq: Once | INTRAMUSCULAR | Status: DC | PRN

## 2012-09-22 MED ORDER — PHENYLEPHRINE HCL 2.5 % OP SOLN
1.0000 [drp] | OPHTHALMIC | Status: AC
  Administered 2012-09-22 (×3): 1 [drp] via OPHTHALMIC

## 2012-09-22 MED ORDER — NEOMYCIN-POLYMYXIN-DEXAMETH 0.1 % OP OINT
TOPICAL_OINTMENT | OPHTHALMIC | Status: DC | PRN
  Administered 2012-09-22: 1 via OPHTHALMIC

## 2012-09-22 MED ORDER — EPINEPHRINE HCL 1 MG/ML IJ SOLN
INTRAOCULAR | Status: DC | PRN
  Administered 2012-09-22: 09:00:00

## 2012-09-22 MED ORDER — EPINEPHRINE HCL 1 MG/ML IJ SOLN
INTRAMUSCULAR | Status: AC
  Filled 2012-09-22: qty 1

## 2012-09-22 MED ORDER — FENTANYL CITRATE 0.05 MG/ML IJ SOLN: 25.0000 ug | INTRAMUSCULAR | Status: DC | PRN

## 2012-09-22 MED ORDER — LACTATED RINGERS IV SOLN
INTRAVENOUS | Status: DC
  Administered 2012-09-22: 1000 mL via INTRAVENOUS

## 2012-09-22 MED ORDER — MIDAZOLAM HCL 2 MG/2ML IJ SOLN
INTRAMUSCULAR | Status: AC
  Filled 2012-09-22: qty 2

## 2012-09-22 MED ORDER — MIDAZOLAM HCL 2 MG/2ML IJ SOLN
1.0000 mg | INTRAMUSCULAR | Status: DC | PRN
  Administered 2012-09-22 (×2): 1 mg via INTRAVENOUS

## 2012-09-22 MED ORDER — PROVISC 10 MG/ML IO SOLN
INTRAOCULAR | Status: DC | PRN
  Administered 2012-09-22: 8.5 mg via INTRAOCULAR

## 2012-09-22 MED ORDER — CYCLOPENTOLATE-PHENYLEPHRINE 0.2-1 % OP SOLN
1.0000 [drp] | OPHTHALMIC | Status: AC
  Administered 2012-09-22 (×3): 1 [drp] via OPHTHALMIC

## 2012-09-22 MED ORDER — LIDOCAINE HCL (PF) 1 % IJ SOLN
INTRAOCULAR | Status: DC | PRN
  Administered 2012-09-22: 09:00:00 via OPHTHALMIC

## 2012-09-22 MED ORDER — LIDOCAINE HCL 3.5 % OP GEL
1.0000 "application " | Freq: Once | OPHTHALMIC | Status: AC
  Administered 2012-09-22: 1 via OPHTHALMIC

## 2012-09-22 MED ORDER — BSS IO SOLN
INTRAOCULAR | Status: DC | PRN
  Administered 2012-09-22: 15 mL via INTRAOCULAR

## 2012-09-22 SURGICAL SUPPLY — 32 items
CAPSULAR TENSION RING-AMO (OPHTHALMIC RELATED) IMPLANT
CLOTH BEACON ORANGE TIMEOUT ST (SAFETY) ×1 IMPLANT
EYE SHIELD UNIVERSAL CLEAR (GAUZE/BANDAGES/DRESSINGS) ×1 IMPLANT
GLOVE BIO SURGEON STRL SZ 6.5 (GLOVE) IMPLANT
GLOVE BIOGEL PI IND STRL 6.5 (GLOVE) IMPLANT
GLOVE BIOGEL PI IND STRL 7.0 (GLOVE) IMPLANT
GLOVE BIOGEL PI IND STRL 7.5 (GLOVE) IMPLANT
GLOVE BIOGEL PI INDICATOR 6.5 (GLOVE)
GLOVE BIOGEL PI INDICATOR 7.0 (GLOVE) ×2
GLOVE BIOGEL PI INDICATOR 7.5 (GLOVE)
GLOVE ECLIPSE 6.5 STRL STRAW (GLOVE) IMPLANT
GLOVE ECLIPSE 7.0 STRL STRAW (GLOVE) IMPLANT
GLOVE ECLIPSE 7.5 STRL STRAW (GLOVE) IMPLANT
GLOVE EXAM NITRILE LRG STRL (GLOVE) IMPLANT
GLOVE EXAM NITRILE MD LF STRL (GLOVE) IMPLANT
GLOVE SKINSENSE NS SZ6.5 (GLOVE)
GLOVE SKINSENSE NS SZ7.0 (GLOVE)
GLOVE SKINSENSE STRL SZ6.5 (GLOVE) IMPLANT
GLOVE SKINSENSE STRL SZ7.0 (GLOVE) IMPLANT
KIT VITRECTOMY (OPHTHALMIC RELATED) IMPLANT
PAD ARMBOARD 7.5X6 YLW CONV (MISCELLANEOUS) ×1 IMPLANT
PROC W NO LENS (INTRAOCULAR LENS)
PROC W SPEC LENS (INTRAOCULAR LENS)
PROCESS W NO LENS (INTRAOCULAR LENS) IMPLANT
PROCESS W SPEC LENS (INTRAOCULAR LENS) IMPLANT
RING MALYGIN (MISCELLANEOUS) ×1 IMPLANT
SIGHTPATH CAT PROC W REG LENS (Ophthalmic Related) ×2 IMPLANT
SYR TB 1ML LL NO SAFETY (SYRINGE) ×1 IMPLANT
TAPE SURG TRANSPORE 1 IN (GAUZE/BANDAGES/DRESSINGS) IMPLANT
TAPE SURGICAL TRANSPORE 1 IN (GAUZE/BANDAGES/DRESSINGS) ×1
VISCOELASTIC ADDITIONAL (OPHTHALMIC RELATED) IMPLANT
WATER STERILE IRR 250ML POUR (IV SOLUTION) ×1 IMPLANT

## 2012-09-22 NOTE — Op Note (Signed)
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  Assistants: None  Anesthesia: Topical with MAC  Indications: Painless, progressive loss of vision with compromise of daily activities.  Surgery: Cataract Extraction with Intraocular lens Implant Right Eye  Discription: The patient had dilating drops and viscous lidocaine placed into the left eye in the pre-op holding area. After transfer to the operating room, a time out was performed. The patient was then prepped and draped. Beginning with a 67 degree blade a paracentesis port was made at the surgeon's 2 o'clock position. The anterior chamber was then filled with 1% non-preserved lidocaine with epinepherine. This was followed by filling the anterior chamber with Provisc. The iris appeared floppy and the pupil diameter was about 10mm. A Malyugan ring was placed in the anterior chamber and positioned in the pupil with a Kuglan hook. A bent cystatome needle was used to create a continuous tear capsulotomy. Hydrodissection was performed with balanced salt solution on a Fine canula. The lens nucleus was then removed using the phacoemulsification handpiece. Residual cortex was removed with the I&A handpiece. The anterior chamber and capsular bag were refilled with Provisc. A posterior chamber intraocular lens was placed into the capsular bag with it's injector. The implant was positioned with the Kuglan hook. The Malyugan ring was disengaged from the iris and removed. The Provisc was then removed from the anterior chamber and capsular bag with the I&A handpiece. Stromal hydration of the main incision and paracentesis port was performed with BSS on a Fine canula. The wounds were tested for leak which was negative. The patient tolerated the procedure well. There were no operative complications. The  patient was then transferred to the recovery room in stable condition.  Complications: None  Specimen: None  EBL: None  Prosthetic device: B&L enVista, MX60, power 23.5D, [id number].

## 2012-09-22 NOTE — H&P (Signed)
I have reviewed the H&P, the patient was re-examined, and I have identified no interval changes in medical condition and plan of care since the history and physical of record  

## 2012-09-22 NOTE — Transfer of Care (Signed)
Immediate Anesthesia Transfer of Care Note  Patient: Donald Gaines  Procedure(s) Performed: Procedure(s) with comments: CATARACT EXTRACTION PHACO AND INTRAOCULAR LENS PLACEMENT (IOC) (Right) - CDE: 11.43  Patient Location: PACU and Short Stay  Anesthesia Type:MAC  Level of Consciousness: awake  Airway & Oxygen Therapy: Patient Spontanous Breathing  Post-op Assessment: Report given to PACU RN  Post vital signs: Reviewed  Complications: No apparent anesthesia complications

## 2012-09-22 NOTE — Anesthesia Postprocedure Evaluation (Signed)
  Anesthesia Post-op Note  Patient: Donald Gaines  Procedure(s) Performed: Procedure(s) with comments: CATARACT EXTRACTION PHACO AND INTRAOCULAR LENS PLACEMENT (IOC) (Right) - CDE: 11.43  Patient Location: PACU and Short Stay  Anesthesia Type:MAC  Level of Consciousness: awake, alert  and oriented  Airway and Oxygen Therapy: Patient Spontanous Breathing  Post-op Pain: none  Post-op Assessment: Post-op Vital signs reviewed, Patient's Cardiovascular Status Stable, Respiratory Function Stable, Patent Airway and No signs of Nausea or vomiting  Post-op Vital Signs: Reviewed and stable  Complications: No apparent anesthesia complications

## 2012-09-22 NOTE — Anesthesia Preprocedure Evaluation (Signed)
Anesthesia Evaluation  Patient identified by MRN, date of birth, ID band Patient awake    Reviewed: Allergy & Precautions, H&P , NPO status , Patient's Chart, lab work & pertinent test results  Airway Mallampati: II      Dental  (+) Edentulous Upper and Edentulous Lower   Pulmonary sleep apnea , COPD breath sounds clear to auscultation        Cardiovascular hypertension, + CAD, + Past MI and + Peripheral Vascular Disease Rhythm:Regular Rate:Normal     Neuro/Psych PSYCHIATRIC DISORDERS Depression TIACVA    GI/Hepatic GERD-  Medicated,  Endo/Other  diabetes, Well Controlled, Type 2  Renal/GU      Musculoskeletal   Abdominal   Peds  Hematology   Anesthesia Other Findings   Reproductive/Obstetrics                           Anesthesia Physical Anesthesia Plan  ASA: III  Anesthesia Plan: MAC   Post-op Pain Management:    Induction: Intravenous  Airway Management Planned: Nasal Cannula  Additional Equipment:   Intra-op Plan:   Post-operative Plan:   Informed Consent: I have reviewed the patients History and Physical, chart, labs and discussed the procedure including the risks, benefits and alternatives for the proposed anesthesia with the patient or authorized representative who has indicated his/her understanding and acceptance.     Plan Discussed with:   Anesthesia Plan Comments:         Anesthesia Quick Evaluation

## 2012-09-26 ENCOUNTER — Encounter (HOSPITAL_COMMUNITY): Payer: Self-pay | Admitting: Ophthalmology

## 2012-10-04 ENCOUNTER — Encounter (HOSPITAL_COMMUNITY): Payer: Self-pay | Admitting: Pharmacy Technician

## 2012-10-10 ENCOUNTER — Encounter (HOSPITAL_COMMUNITY): Payer: Self-pay

## 2012-10-10 ENCOUNTER — Encounter (HOSPITAL_COMMUNITY): Admission: RE | Admit: 2012-10-10 | Payer: Medicare Other | Source: Ambulatory Visit

## 2012-10-11 ENCOUNTER — Encounter (HOSPITAL_COMMUNITY): Payer: Medicare Other

## 2012-10-14 ENCOUNTER — Ambulatory Visit (HOSPITAL_COMMUNITY): Payer: Medicare Other | Admitting: Oncology

## 2012-10-14 MED ORDER — NEOMYCIN-POLYMYXIN-DEXAMETH 3.5-10000-0.1 OP OINT
TOPICAL_OINTMENT | OPHTHALMIC | Status: AC
  Filled 2012-10-14: qty 3.5

## 2012-10-14 MED ORDER — TETRACAINE HCL 0.5 % OP SOLN
OPHTHALMIC | Status: AC
  Filled 2012-10-14: qty 2

## 2012-10-14 MED ORDER — CYCLOPENTOLATE-PHENYLEPHRINE 0.2-1 % OP SOLN
OPHTHALMIC | Status: AC
  Filled 2012-10-14: qty 2

## 2012-10-14 MED ORDER — LIDOCAINE HCL (PF) 1 % IJ SOLN
INTRAMUSCULAR | Status: AC
  Filled 2012-10-14: qty 2

## 2012-10-14 MED ORDER — LIDOCAINE HCL 3.5 % OP GEL
OPHTHALMIC | Status: AC
  Filled 2012-10-14: qty 5

## 2012-10-14 NOTE — Progress Notes (Signed)
-  No show, letter sent-   

## 2012-10-17 ENCOUNTER — Ambulatory Visit (HOSPITAL_COMMUNITY): Payer: Medicare Other | Admitting: Anesthesiology

## 2012-10-17 ENCOUNTER — Encounter (HOSPITAL_COMMUNITY)
Admission: RE | Disposition: A | Discharge status: Home / Self Care | Payer: Self-pay | Source: Ambulatory Visit | Attending: Ophthalmology

## 2012-10-17 ENCOUNTER — Encounter (HOSPITAL_COMMUNITY): Payer: Self-pay | Admitting: Ophthalmology

## 2012-10-17 ENCOUNTER — Ambulatory Visit (HOSPITAL_COMMUNITY)
Admission: RE | Admit: 2012-10-17 | Discharge: 2012-10-17 | Disposition: A | Discharge status: Home / Self Care | Payer: Medicare Other | Source: Ambulatory Visit | Attending: Ophthalmology | Admitting: Ophthalmology

## 2012-10-17 ENCOUNTER — Encounter (HOSPITAL_COMMUNITY): Payer: Self-pay | Admitting: Anesthesiology

## 2012-10-17 DIAGNOSIS — Z79899 Other long term (current) drug therapy: Secondary | ICD-10-CM | POA: Insufficient documentation

## 2012-10-17 DIAGNOSIS — H2510 Age-related nuclear cataract, unspecified eye: Secondary | ICD-10-CM | POA: Insufficient documentation

## 2012-10-17 DIAGNOSIS — H2181 Floppy iris syndrome: Secondary | ICD-10-CM | POA: Insufficient documentation

## 2012-10-17 DIAGNOSIS — Z01812 Encounter for preprocedural laboratory examination: Secondary | ICD-10-CM | POA: Insufficient documentation

## 2012-10-17 DIAGNOSIS — E119 Type 2 diabetes mellitus without complications: Secondary | ICD-10-CM | POA: Insufficient documentation

## 2012-10-17 DIAGNOSIS — I10 Essential (primary) hypertension: Secondary | ICD-10-CM | POA: Insufficient documentation

## 2012-10-17 HISTORY — PX: CATARACT EXTRACTION W/PHACO: SHX586

## 2012-10-17 LAB — GLUCOSE, CAPILLARY: Glucose-Capillary: 106 mg/dL — ABNORMAL HIGH (ref 70–99)

## 2012-10-17 SURGERY — PHACOEMULSIFICATION, CATARACT, WITH IOL INSERTION
Anesthesia: Monitor Anesthesia Care | Site: Eye | Laterality: Left

## 2012-10-17 SURGERY — PHACOEMULSIFICATION, CATARACT, WITH IOL INSERTION
Anesthesia: Monitor Anesthesia Care | Site: Eye | Laterality: Left | Wound class: Clean

## 2012-10-17 MED ORDER — PHENYLEPHRINE HCL 2.5 % OP SOLN
1.0000 [drp] | OPHTHALMIC | Status: AC
  Administered 2012-10-17 (×3): 1 [drp] via OPHTHALMIC

## 2012-10-17 MED ORDER — PROVISC 10 MG/ML IO SOLN
INTRAOCULAR | Status: DC | PRN
  Administered 2012-10-17: 8.5 mg via INTRAOCULAR

## 2012-10-17 MED ORDER — NEOMYCIN-POLYMYXIN-DEXAMETH 0.1 % OP OINT
TOPICAL_OINTMENT | OPHTHALMIC | Status: DC | PRN
  Administered 2012-10-17: 1 via OPHTHALMIC

## 2012-10-17 MED ORDER — CYCLOPENTOLATE-PHENYLEPHRINE 0.2-1 % OP SOLN
1.0000 [drp] | OPHTHALMIC | Status: AC
  Administered 2012-10-17 (×3): 1 [drp] via OPHTHALMIC

## 2012-10-17 MED ORDER — TETRACAINE HCL 0.5 % OP SOLN
1.0000 [drp] | OPHTHALMIC | Status: AC
  Administered 2012-10-17 (×3): 1 [drp] via OPHTHALMIC

## 2012-10-17 MED ORDER — EPINEPHRINE HCL 1 MG/ML IJ SOLN
INTRAOCULAR | Status: DC | PRN
  Administered 2012-10-17: 12:00:00

## 2012-10-17 MED ORDER — BSS IO SOLN
INTRAOCULAR | Status: DC | PRN
  Administered 2012-10-17: 15 mL via INTRAOCULAR

## 2012-10-17 MED ORDER — LACTATED RINGERS IV SOLN
INTRAVENOUS | Status: DC
  Administered 2012-10-17: 1000 mL via INTRAVENOUS

## 2012-10-17 MED ORDER — LIDOCAINE HCL 3.5 % OP GEL: 1.0000 "application " | Freq: Once | OPHTHALMIC | Status: DC

## 2012-10-17 MED ORDER — PHENYLEPHRINE HCL 10 MG/ML IJ SOLN
INTRAMUSCULAR | Status: DC | PRN
  Administered 2012-10-17: 7 mL

## 2012-10-17 MED ORDER — PHENYLEPHRINE HCL 10 MG/ML IJ SOLN
INTRAMUSCULAR | Status: AC
  Filled 2012-10-17: qty 1

## 2012-10-17 MED ORDER — POVIDONE-IODINE 5 % OP SOLN
OPHTHALMIC | Status: DC | PRN
  Administered 2012-10-17: 1 via OPHTHALMIC

## 2012-10-17 MED ORDER — LIDOCAINE HCL (PF) 1 % IJ SOLN
INTRAMUSCULAR | Status: DC | PRN
  Administered 2012-10-17: 8 mL

## 2012-10-17 MED ORDER — MIDAZOLAM HCL 2 MG/2ML IJ SOLN
1.0000 mg | INTRAMUSCULAR | Status: DC | PRN
  Administered 2012-10-17: 2 mg via INTRAVENOUS

## 2012-10-17 MED ORDER — KETOROLAC TROMETHAMINE 0.5 % OP SOLN: 1.0000 [drp] | OPHTHALMIC | Status: AC

## 2012-10-17 MED ORDER — MIDAZOLAM HCL 2 MG/2ML IJ SOLN
INTRAMUSCULAR | Status: AC
  Filled 2012-10-17: qty 2

## 2012-10-17 SURGICAL SUPPLY — 32 items
CAPSULAR TENSION RING-AMO (OPHTHALMIC RELATED) IMPLANT
CLOTH BEACON ORANGE TIMEOUT ST (SAFETY) ×1 IMPLANT
EYE SHIELD UNIVERSAL CLEAR (GAUZE/BANDAGES/DRESSINGS) ×1 IMPLANT
GLOVE BIO SURGEON STRL SZ 6.5 (GLOVE) IMPLANT
GLOVE BIOGEL PI IND STRL 6.5 (GLOVE) IMPLANT
GLOVE BIOGEL PI IND STRL 7.0 (GLOVE) IMPLANT
GLOVE BIOGEL PI IND STRL 7.5 (GLOVE) IMPLANT
GLOVE BIOGEL PI INDICATOR 6.5 (GLOVE) ×1
GLOVE BIOGEL PI INDICATOR 7.0 (GLOVE)
GLOVE BIOGEL PI INDICATOR 7.5 (GLOVE)
GLOVE ECLIPSE 6.5 STRL STRAW (GLOVE) IMPLANT
GLOVE ECLIPSE 7.0 STRL STRAW (GLOVE) IMPLANT
GLOVE ECLIPSE 7.5 STRL STRAW (GLOVE) IMPLANT
GLOVE EXAM NITRILE LRG STRL (GLOVE) IMPLANT
GLOVE EXAM NITRILE MD LF STRL (GLOVE) ×1 IMPLANT
GLOVE SKINSENSE NS SZ6.5 (GLOVE)
GLOVE SKINSENSE NS SZ7.0 (GLOVE)
GLOVE SKINSENSE STRL SZ6.5 (GLOVE) IMPLANT
GLOVE SKINSENSE STRL SZ7.0 (GLOVE) IMPLANT
KIT VITRECTOMY (OPHTHALMIC RELATED) IMPLANT
PAD ARMBOARD 7.5X6 YLW CONV (MISCELLANEOUS) ×1 IMPLANT
PROC W NO LENS (INTRAOCULAR LENS)
PROC W SPEC LENS (INTRAOCULAR LENS)
PROCESS W NO LENS (INTRAOCULAR LENS) IMPLANT
PROCESS W SPEC LENS (INTRAOCULAR LENS) IMPLANT
RING MALYGIN (MISCELLANEOUS) ×1 IMPLANT
SIGHTPATH CAT PROC W REG LENS (Ophthalmic Related) ×2 IMPLANT
SYR TB 1ML LL NO SAFETY (SYRINGE) ×1 IMPLANT
TAPE SURG TRANSPORE 1 IN (GAUZE/BANDAGES/DRESSINGS) IMPLANT
TAPE SURGICAL TRANSPORE 1 IN (GAUZE/BANDAGES/DRESSINGS) ×1
VISCOELASTIC ADDITIONAL (OPHTHALMIC RELATED) IMPLANT
WATER STERILE IRR 250ML POUR (IV SOLUTION) ×1 IMPLANT

## 2012-10-17 NOTE — Anesthesia Preprocedure Evaluation (Signed)
Anesthesia Evaluation  Patient identified by MRN, date of birth, ID band Patient awake    Reviewed: Allergy & Precautions, H&P , NPO status , Patient's Chart, lab work & pertinent test results  Airway Mallampati: II      Dental  (+) Edentulous Upper and Edentulous Lower   Pulmonary sleep apnea , COPD breath sounds clear to auscultation        Cardiovascular hypertension, + CAD, + Past MI and + Peripheral Vascular Disease Rhythm:Regular Rate:Normal     Neuro/Psych PSYCHIATRIC DISORDERS Depression TIACVA    GI/Hepatic GERD-  Medicated,  Endo/Other  diabetes, Well Controlled, Type 2  Renal/GU      Musculoskeletal   Abdominal   Peds  Hematology   Anesthesia Other Findings   Reproductive/Obstetrics                           Anesthesia Physical Anesthesia Plan  ASA: III  Anesthesia Plan: MAC   Post-op Pain Management:    Induction: Intravenous  Airway Management Planned: Nasal Cannula  Additional Equipment:   Intra-op Plan:   Post-operative Plan:   Informed Consent: I have reviewed the patients History and Physical, chart, labs and discussed the procedure including the risks, benefits and alternatives for the proposed anesthesia with the patient or authorized representative who has indicated his/her understanding and acceptance.     Plan Discussed with:   Anesthesia Plan Comments:         Anesthesia Quick Evaluation

## 2012-10-17 NOTE — H&P (Signed)
I have reviewed the H&P, the patient was re-examined, and I have identified no interval changes in medical condition and plan of care since the history and physical of record  

## 2012-10-17 NOTE — Anesthesia Postprocedure Evaluation (Signed)
  Anesthesia Post-op Note  Patient: Donald Gaines  Procedure(s) Performed: Procedure(s) with comments: CATARACT EXTRACTION PHACO AND INTRAOCULAR LENS PLACEMENT (IOC) (Left) - CDE: 8.06  Patient Location: PACU and Short Stay  Anesthesia Type:MAC  Level of Consciousness: awake, alert  and oriented  Airway and Oxygen Therapy: Patient Spontanous Breathing  Post-op Pain: none  Post-op Assessment: Post-op Vital signs reviewed, Patient's Cardiovascular Status Stable, Respiratory Function Stable, Patent Airway and No signs of Nausea or vomiting  Post-op Vital Signs: Reviewed and stable  Complications: No apparent anesthesia complications

## 2012-10-17 NOTE — Transfer of Care (Signed)
Immediate Anesthesia Transfer of Care Note  Patient: Donald Gaines  Procedure(s) Performed: Procedure(s) with comments: CATARACT EXTRACTION PHACO AND INTRAOCULAR LENS PLACEMENT (IOC) (Left) - CDE: 8.06  Patient Location: PACU and Short Stay  Anesthesia Type:MAC  Level of Consciousness: awake  Airway & Oxygen Therapy: Patient Spontanous Breathing  Post-op Assessment: Report given to PACU RN  Post vital signs: Reviewed  Complications: No apparent anesthesia complications

## 2012-10-17 NOTE — Op Note (Signed)
Date of Admission: 10/17/2012  Date of Surgery: 10/17/2012  Pre-Op Dx: Cataract  Left  Eye  Post-Op Dx: Cataract  Left  Eye,  Dx Code 366.16, Intraoperative Floppy Iris Syndrome Dx Code 364.81  Surgeon: Tonny Branch, M.D.  Assistants: None  Anesthesia: Topical with MAC  Indications: Painless, progressive loss of vision with compromise of daily activities.  Surgery: Cataract Extraction with Intraocular lens Implant Left Eye  Discription: The patient had dilating drops and viscous lidocaine placed into the left eye in the pre-op holding area. After transfer to the operating room, a time out was performed. The patient was then prepped and draped. Beginning with a 44 degree blade a paracentesis port was made at the surgeon's 2 o'clock position. The anterior chamber was then filled with 1% non-preserved lidocaine with epinepherine. This was followed by filling the anterior chamber with Provisc. A Malyugan ring was placed into the anterior chamber using its injector. The loops were positioned with the Kuglan hook. A bent cystatome needle was used to create a continuous tear capsulotomy. Hydrodissection was performed with balanced salt solution on a Fine canula. The lens nucleus was then removed using the phacoemulsification handpiece. Residual cortex was removed with the I&A handpiece. The anterior chamber and capsular bag were refilled with Provisc. A posterior chamber intraocular lens was placed into the capsular bag with it's injector. The implant was positioned with the Kuglan hook. The Malyugan ring was disengaged from the iris margin and removed with its injector. The Provisc was then removed from the anterior chamber and capsular bag with the I&A handpiece. Stromal hydration of the main incision and paracentesis port was performed with BSS on a Fine canula. The wounds were tested for leak which was negative. The patient tolerated the procedure well. There were no operative complications. The patient  was then transferred to the recovery room in stable condition.  Complications: None  Specimen: None  EBL: None  Prosthetic device: B&L enVista, MX60, power 24.5D, [id number].

## 2012-10-17 NOTE — Progress Notes (Signed)
Encouraged wife to take husband to have blood check since his numbers were low in June. She stated they would go see Dr. Wende Neighbors.

## 2012-10-18 ENCOUNTER — Encounter (HOSPITAL_COMMUNITY): Payer: Self-pay | Admitting: Ophthalmology

## 2012-10-21 ENCOUNTER — Encounter: Payer: Self-pay | Admitting: Gastroenterology

## 2012-11-02 ENCOUNTER — Telehealth (INDEPENDENT_AMBULATORY_CARE_PROVIDER_SITE_OTHER): Payer: Self-pay | Admitting: *Deleted

## 2012-11-02 ENCOUNTER — Encounter (HOSPITAL_COMMUNITY): Payer: Self-pay | Admitting: Pharmacy Technician

## 2012-11-02 ENCOUNTER — Other Ambulatory Visit (INDEPENDENT_AMBULATORY_CARE_PROVIDER_SITE_OTHER): Payer: Self-pay | Admitting: *Deleted

## 2012-11-02 ENCOUNTER — Encounter (INDEPENDENT_AMBULATORY_CARE_PROVIDER_SITE_OTHER): Payer: Self-pay | Admitting: Internal Medicine

## 2012-11-02 ENCOUNTER — Ambulatory Visit (INDEPENDENT_AMBULATORY_CARE_PROVIDER_SITE_OTHER): Payer: Medicare Other | Admitting: Internal Medicine

## 2012-11-02 VITALS — BP 92/50 | HR 56 | Ht 65.5 in | Wt 182.7 lb

## 2012-11-02 DIAGNOSIS — Z1211 Encounter for screening for malignant neoplasm of colon: Secondary | ICD-10-CM

## 2012-11-02 DIAGNOSIS — D649 Anemia, unspecified: Secondary | ICD-10-CM

## 2012-11-02 DIAGNOSIS — K5520 Angiodysplasia of colon without hemorrhage: Secondary | ICD-10-CM

## 2012-11-02 DIAGNOSIS — Z86010 Personal history of colonic polyps: Secondary | ICD-10-CM

## 2012-11-02 LAB — CBC WITH DIFFERENTIAL/PLATELET
Basophils Absolute: 0 10*3/uL (ref 0.0–0.1)
Basophils Relative: 1 % (ref 0–1)
HCT: 27.2 % — ABNORMAL LOW (ref 39.0–52.0)
Hemoglobin: 8.2 g/dL — ABNORMAL LOW (ref 13.0–17.0)
MCH: 23.6 pg — ABNORMAL LOW (ref 26.0–34.0)
MCV: 78.2 fL (ref 78.0–100.0)
Neutro Abs: 5.3 10*3/uL (ref 1.7–7.7)
RBC: 3.48 MIL/uL — ABNORMAL LOW (ref 4.22–5.81)
WBC: 6.7 10*3/uL (ref 4.0–10.5)

## 2012-11-02 LAB — IRON AND TIBC: TIBC: 434 ug/dL (ref 215–435)

## 2012-11-02 NOTE — Telephone Encounter (Signed)
noted 

## 2012-11-02 NOTE — Patient Instructions (Addendum)
Colonoscopy, EGD.

## 2012-11-02 NOTE — Telephone Encounter (Signed)
Lelon Frohlich - I rec'd a phone call from [place] at Dr. Delphina Cahill. She states that Dr.Hall said it would be fine for Mr. Million to stop the Plavix 5 days prior to procedure.

## 2012-11-02 NOTE — Telephone Encounter (Signed)
Patient needs movi prep 

## 2012-11-02 NOTE — Progress Notes (Addendum)
Subjective:     Patient ID: Donald Gaines, male   DOB: 05-Dec-1940, 72 y.o.   MRN: [id number]  HPI Referred to our office by Dr. Nevada Crane for anemia. Previous workup in the past by Dr. Oneida Alar showed  Gastric and descending colon AVMS and well as cecal ulcers. Patient appears  Anemic today. He says he feels bad all the time. He does not know if he is depressed.  Appetite is not good. He has some lower abdominal pain.  Weight loss of about 10 pounds since 2012. He usually has a BM daily. Usually has 3-4 times a day. Per Dr. Nevada Crane he was guaiac positive x 3 with the stool cards.  Patient's own words: I wanted a second opinion.  His last iron infusion was greater than 7 months.    NO NSAIDS. 10/18/2012 H and H 8.8 and 29.3, MCV 80.3  09/15/2012  H and H 9.5 and 31.2.  03/02/2012 Ferritin 258, Iron 66, TIBC 21, UIBC 244.    Capsule Endoscopy 07/04/2008: Anemia: NDICATION FOR EXAM: Donald Gaines is a 72 year old male who presented  with a persistent iron deficiency anemia. A ferritin was 8 and his  hemoglobin was 11.3. He has a history of anemia caused by gastric and  descending colon AVMs as well as cecal ulcers caused by aspirin. He  requires chronic maintenance on Plavix for his coronary artery disease.   DIAGNOSIS: [name] has a persistent iron-deficiency anemia  secondary to small-bowel arteriovenous malformations. His condition is  exacerbated by the need for antiplatelet therapy.    07/12/2006 Colonoscopy:  FINDINGS:  1. The cecum and the ileocecal valve are normal in appearance.  2. Three mm cecal polyp removed via cold forceps.  3. Six mm descending colon polyp and 6 mm sigmoid colon polyp removed  via snare cautery. Otherwise, no masses, inflammatory changes seen  throughout the colon.  4. Sigmoid colon diverticulosis.  5. Normal retroflexed view of the rectum.  6. Donald Gaines has a tortuous colon which made intubating the cecum  rather challenging.  7. Small  internal hemorrhoids.  Biopsy:  FINAL DIAGNOSIS MICROSCOPIC EXAMINATION AND DIAGNOSIS  COLON, POLYP(S): ADENOMATOUS POLYP(S). NO HIGH GRADE DYSPLASIA OR INVASIVE MALIGNANCY IDENTIFIED.   RECOMMENDATIONS:  1. High fiber diet: Mr. Stotz is given information on  diverticulosis, polyps, high fiber diet, and hemorrhoids.  2. No aspirin, anti-inflammatory drugs, or anticoagulation x7 days.  3. He may restart his Plavix on July 20, 2006.  4. We will call Mr. Awadallah with the results of his biopsies.  5. Screening colonoscopy in 5 years. He should have a polyethylene  Glycol bowel prep.   05/05/2006: EGD/Colonoscopy Dr. Oneida Alar: FINDINGS:  1. Donald Gaines had a tortuous sigmoid colon which made intubating his  terminal ileum impossible. I was able to intubate his cecum. He  had a 6-mm flat cecal ulcer with no visible vessel. Biopsies were  obtained via cold forceps.  2. He had 2 large ileocecal ulcers that were flat. Biopsies were  taken via jumbo forceps. He had left-sided diverticulosis.  3. A 6-mm rectosigmoid polyp removed via snare cautery.  4. Moderate-sized internal hemorrhoids.  5. A 6-mm submucosal lesion with pill-like consistency, likely a  lipoma. Cold forceps biopsies taken. Otherwise, normal esophagus  without evidence of Barrett's. Normal stomach without evidence of  erythema, erosions, or ulcerations.  polyp adenomatous, then his children and brothers and sisters  should have a screening colonoscopy beginning at age 65 and then  every 10 years,  unless they are diagnosed with an adenomatous  polyp.   Biopsy:    1. COLON, LARGE ULCERS AT ILEOCECAL VALVE: FINDINGS CONSISTENT WITH ULCER. SEE COMMENT.  2. COLON, RECTAL POLYP: TWO FRAGMENTS OF TUBULAR ADENOMA. NO HIGH GRADE DYSPLASIA OR MALIGNANCY IDENTIFIED.  3. DUODENAL NODULE: BENIGN DUODENAL MUCOSA WITH A SMALL AMOUNT OF ATTACHED ADIPOSE TISSUE. NO ADENOMATOUS CHANGE OR MALIGNANCY IDENTIFIED.   Review of  Systems see hpi Current Outpatient Prescriptions  Medication Sig Dispense Refill  . ADVAIR DISKUS 250-50 MCG/DOSE AEPB Inhale 1 puff into the lungs 2 (two) times daily.       . Albuterol Sulfate (PROAIR HFA IN) Inhale 2 puffs into the lungs 3 (three) times daily.       Marland Kitchen ALPRAZolam (XANAX) 0.5 MG tablet Take 0.5 mg by mouth 2 (two) times daily as needed for anxiety.       Marland Kitchen Besifloxacin HCl (BESIVANCE) 0.6 % SUSP Apply to eye.      . Bromfenac Sodium (PROLENSA) 0.07 % SOLN Apply 1 drop to eye daily.      . Carboxymethylcellul-Glycerin (REFRESH OPTIVE OP) Apply to eye as needed.      . carvedilol (COREG) 6.25 MG tablet Take 6.25 mg by mouth 2 (two) times daily with a meal.       . cetirizine (ZYRTEC) 10 MG tablet Take 10 mg by mouth daily.        . clopidogrel (PLAVIX) 75 MG tablet TAKE 1 TABLET BY MOUTH ONCE DAILY  30 tablet  2  . CRESTOR 20 MG tablet TAKE 1 TABLET BY MOUTH EVERY DAY  30 tablet  3  . Difluprednate (DUREZOL) 0.05 % EMUL Apply 1 drop to eye 3 (three) times daily.      Marland Kitchen dutasteride (AVODART) 0.5 MG capsule Take 0.5 mg by mouth daily.       Marland Kitchen FLUoxetine (PROZAC) 20 MG capsule TAKE 1 CAPSULE TWICE A DAY  60 capsule  3  . furosemide (LASIX) 40 MG tablet Take 40 mg by mouth daily.       . isosorbide mononitrate (IMDUR) 60 MG 24 hr tablet TAKE 1 TABLET EVERY DAY  30 tablet  6  . losartan (COZAAR) 50 MG tablet Take 50 mg by mouth daily.       Marland Kitchen MYRBETRIQ 25 MG TB24 Take 25 mg by mouth daily.       Marland Kitchen NITROSTAT 0.4 MG SL tablet Place 0.4 mg under the tongue every 5 (five) minutes as needed.       Marland Kitchen omeprazole (PRILOSEC) 20 MG capsule Take 20 mg by mouth 2 (two) times daily. 1 30 MIN BEFORE MEALS TWICE DAILY      . sitaGLIPtan-metformin (JANUMET) 50-1000 MG per tablet Take 1 tablet by mouth 2 (two) times daily with a meal.        . Tamsulosin HCl (FLOMAX) 0.4 MG CAPS Take 0.4 mg by mouth daily. Takes in AM      . traMADol (ULTRAM) 50 MG tablet Take 50 mg by mouth every 8 (eight) hours  as needed for pain.      . hydrocodone-acetaminophen (LORCET-HD) 5-500 MG per capsule Take 1 capsule by mouth every 6 (six) hours as needed for pain.       No current facility-administered medications for this visit.   Past Medical History  Diagnosis Date  . TIA (transient ischemic attack)     2005  . GERD (gastroesophageal reflux disease)   . Hyperlipidemia   . Hypertension   .  Depression   . Diabetes mellitus, type 2   . Restrictive lung disease   . Candida esophagitis   . Hemorrhoids   . Arteriovenous malformation small bowel     Capsule study 3/10  . GI bleeding     Cecal ulcers, arteriovenous malformations  . Chronic diarrhea SEP 2011    ?ETIOLOGY-DIABETIC ENTEROPATHY, LACTOSE INTOLERANCE,  OR IBS-MIXED  . Bloating AUG 2009    NL HBT FOR SIBO  . Anemia FEB 2008 FEDA 2o to AVMs, & large colon ulcers    HB 10.7 MCV 74.3; Dr Tressie Stalker  . BPH (benign prostatic hypertrophy)     Dr Maryland Pink  . Glaucoma   . Cataract   . ASCVD (arteriosclerotic cardiovascular disease)     LIMA to LAD and LIMA to intermediate branch, SVG to PD and second OM. 06/03/93  . Bilateral carotid artery stenosis     50-75% external left carotid stenosis  . Coronary atherosclerosis of native coronary artery     Multivessel, BMS SVG TO RCA 2000, reportedly  "2" additional stents in interim  . Myocardial infarction     1994  . Stroke     mini- strokes in past.States has weakness and numbness of right leg. Short term memory loss  . COPD (chronic obstructive pulmonary disease)   . Sleep apnea    Past Surgical History  Procedure Laterality Date  . Esophagogastroduodenoscopy  SEP 09    DUODENAL LIPOMA  . Capsule endo  03/10  . Right inguinal hernia repair    . Hydrogen breath test  2009  . Colonoscopy  APR 2008    SIMPLE ADENOMA, [place] TICS, IH  . Colonoscopy  FEB 2008 ANEMIA. MELENA     2 LARGE ILEOCEAL ULCERS 2o to ASA, [place] TICS, IH  . Colonoscopy  SEP 2009 TRANSFUSION DEP ANEMIA    AC AVM-ABLATED,  [place] TICS, IH  . Upper gastrointestinal endoscopy  SEP 2009    GASTRIC AVM ABLATED, NL DUO Bx  . Cardiac catherization  04/2012  . Cardiac catheterization  04/05/1996 [facility]    EF 45%, occluded SVG to circumflex, patent SVG to D1 and PDA and patent LIMA to LAD with severe native vessel disease.  . Cardiac catheterization  08/22/1998 [facility]    Mild LM, severe LAD,severe CX, occlude RCA, patient  SVG to diatal RCA, patent vein graft to D1 and patent LIMA to LAD.   Marland Kitchen Coronary angioplasty with stent placement  08/22/1998 [name]    TEC stenting of the SVG to RCA-Last seen by cardiologist in 2010.  Marland Kitchen Coronary artery bypass graft  1995-triple bypass    LIMA-LAD, LIMA to intermediate branch, SVG to PD and second OM,  . Cataract extraction w/phaco Right 09/22/2012    Procedure: CATARACT EXTRACTION PHACO AND INTRAOCULAR LENS PLACEMENT (IOC);  Surgeon: Tonny Branch, MD;  Location: [facility];  Service: Ophthalmology;  Laterality: Right;  CDE: 11.43  . Cataract extraction w/phaco Left 10/17/2012    Procedure: CATARACT EXTRACTION PHACO AND INTRAOCULAR LENS PLACEMENT (IOC);  Surgeon: Tonny Branch, MD;  Location: [facility];  Service: Ophthalmology;  Laterality: Left;  CDE: 8.06   Allergies  Allergen Reactions  . Penicillins Other (See Comments)    Unknown reaction        Objective:   Physical Exam  Filed Vitals:   11/02/12 1042  BP: 92/50  Pulse: 56  Height: 5' 5.5" (1.664 m)  Weight: 182 lb 11.2 oz (82.872 kg)   Alert and oriented. Skin warm  and dry. Oral mucosa is moist.   . Sclera anicteric, conjunctivae is pink. Thyroid not enlarged. No cervical lymphadenopathy. Lungs clear. Heart regular rate and rhythm.  Abdomen is soft. Bowel sounds are positive. No hepatomegaly. No abdominal masses felt. No tenderness.  No edema to lower extremities. Stool brown and guaiac negative.     Assessment:    Anemia. Stool guaiac negative today. Hx of iron deficiency anemia with known hx of small bowel AVMs.   He is on chronic Plavix therapy.     Plan:    Iron studies. CBC. Colonoscopy/EGD

## 2012-11-03 MED ORDER — PEG-KCL-NACL-NASULF-NA ASC-C 100 G PO SOLR: 1.0000 | Freq: Once | ORAL | Status: DC

## 2012-11-07 MED ORDER — ONDANSETRON HCL 4 MG/2ML IJ SOLN: 4.0000 mg | Freq: Once | INTRAMUSCULAR | Status: DC | PRN

## 2012-11-07 MED ORDER — FENTANYL CITRATE 0.05 MG/ML IJ SOLN: 25.0000 ug | INTRAMUSCULAR | Status: DC | PRN

## 2012-11-08 ENCOUNTER — Telehealth: Payer: Self-pay | Admitting: General Practice

## 2012-11-08 ENCOUNTER — Ambulatory Visit (HOSPITAL_COMMUNITY)
Admission: RE | Admit: 2012-11-08 | Discharge: 2012-11-08 | Disposition: A | Discharge status: Home / Self Care | Payer: Medicare Other | Source: Ambulatory Visit | Attending: Internal Medicine | Admitting: Internal Medicine

## 2012-11-08 ENCOUNTER — Encounter (HOSPITAL_COMMUNITY): Payer: Self-pay | Admitting: *Deleted

## 2012-11-08 ENCOUNTER — Encounter (HOSPITAL_COMMUNITY)
Admission: RE | Disposition: A | Discharge status: Home / Self Care | Payer: Self-pay | Source: Ambulatory Visit | Attending: Internal Medicine

## 2012-11-08 DIAGNOSIS — Z860100 Personal history of colon polyps, unspecified: Secondary | ICD-10-CM | POA: Insufficient documentation

## 2012-11-08 DIAGNOSIS — I6529 Occlusion and stenosis of unspecified carotid artery: Secondary | ICD-10-CM | POA: Insufficient documentation

## 2012-11-08 DIAGNOSIS — D128 Benign neoplasm of rectum: Secondary | ICD-10-CM

## 2012-11-08 DIAGNOSIS — K219 Gastro-esophageal reflux disease without esophagitis: Secondary | ICD-10-CM | POA: Insufficient documentation

## 2012-11-08 DIAGNOSIS — G4730 Sleep apnea, unspecified: Secondary | ICD-10-CM | POA: Insufficient documentation

## 2012-11-08 DIAGNOSIS — K766 Portal hypertension: Secondary | ICD-10-CM

## 2012-11-08 DIAGNOSIS — I2510 Atherosclerotic heart disease of native coronary artery without angina pectoris: Secondary | ICD-10-CM | POA: Insufficient documentation

## 2012-11-08 DIAGNOSIS — Z951 Presence of aortocoronary bypass graft: Secondary | ICD-10-CM | POA: Insufficient documentation

## 2012-11-08 DIAGNOSIS — K5520 Angiodysplasia of colon without hemorrhage: Secondary | ICD-10-CM

## 2012-11-08 DIAGNOSIS — D126 Benign neoplasm of colon, unspecified: Secondary | ICD-10-CM

## 2012-11-08 DIAGNOSIS — J984 Other disorders of lung: Secondary | ICD-10-CM | POA: Insufficient documentation

## 2012-11-08 DIAGNOSIS — F17200 Nicotine dependence, unspecified, uncomplicated: Secondary | ICD-10-CM | POA: Insufficient documentation

## 2012-11-08 DIAGNOSIS — E119 Type 2 diabetes mellitus without complications: Secondary | ICD-10-CM | POA: Insufficient documentation

## 2012-11-08 DIAGNOSIS — K620 Anal polyp: Secondary | ICD-10-CM | POA: Insufficient documentation

## 2012-11-08 DIAGNOSIS — D129 Benign neoplasm of anus and anal canal: Secondary | ICD-10-CM

## 2012-11-08 DIAGNOSIS — I10 Essential (primary) hypertension: Secondary | ICD-10-CM | POA: Insufficient documentation

## 2012-11-08 DIAGNOSIS — R195 Other fecal abnormalities: Secondary | ICD-10-CM | POA: Insufficient documentation

## 2012-11-08 DIAGNOSIS — D509 Iron deficiency anemia, unspecified: Secondary | ICD-10-CM | POA: Insufficient documentation

## 2012-11-08 DIAGNOSIS — E785 Hyperlipidemia, unspecified: Secondary | ICD-10-CM | POA: Insufficient documentation

## 2012-11-08 DIAGNOSIS — J4489 Other specified chronic obstructive pulmonary disease: Secondary | ICD-10-CM | POA: Insufficient documentation

## 2012-11-08 DIAGNOSIS — Z7902 Long term (current) use of antithrombotics/antiplatelets: Secondary | ICD-10-CM | POA: Insufficient documentation

## 2012-11-08 DIAGNOSIS — Z8673 Personal history of transient ischemic attack (TIA), and cerebral infarction without residual deficits: Secondary | ICD-10-CM | POA: Insufficient documentation

## 2012-11-08 DIAGNOSIS — Z880 Allergy status to penicillin: Secondary | ICD-10-CM | POA: Insufficient documentation

## 2012-11-08 DIAGNOSIS — J449 Chronic obstructive pulmonary disease, unspecified: Secondary | ICD-10-CM | POA: Insufficient documentation

## 2012-11-08 DIAGNOSIS — IMO0002 Reserved for concepts with insufficient information to code with codable children: Secondary | ICD-10-CM | POA: Insufficient documentation

## 2012-11-08 DIAGNOSIS — I658 Occlusion and stenosis of other precerebral arteries: Secondary | ICD-10-CM | POA: Insufficient documentation

## 2012-11-08 DIAGNOSIS — K644 Residual hemorrhoidal skin tags: Secondary | ICD-10-CM | POA: Insufficient documentation

## 2012-11-08 DIAGNOSIS — I252 Old myocardial infarction: Secondary | ICD-10-CM | POA: Insufficient documentation

## 2012-11-08 DIAGNOSIS — N400 Enlarged prostate without lower urinary tract symptoms: Secondary | ICD-10-CM | POA: Insufficient documentation

## 2012-11-08 DIAGNOSIS — K319 Disease of stomach and duodenum, unspecified: Secondary | ICD-10-CM | POA: Insufficient documentation

## 2012-11-08 DIAGNOSIS — K3189 Other diseases of stomach and duodenum: Secondary | ICD-10-CM

## 2012-11-08 DIAGNOSIS — F3289 Other specified depressive episodes: Secondary | ICD-10-CM | POA: Insufficient documentation

## 2012-11-08 DIAGNOSIS — F329 Major depressive disorder, single episode, unspecified: Secondary | ICD-10-CM | POA: Insufficient documentation

## 2012-11-08 DIAGNOSIS — Z79899 Other long term (current) drug therapy: Secondary | ICD-10-CM | POA: Insufficient documentation

## 2012-11-08 HISTORY — DX: Portal hypertension: K76.6

## 2012-11-08 HISTORY — PX: COLONOSCOPY WITH ESOPHAGOGASTRODUODENOSCOPY (EGD): SHX5779

## 2012-11-08 HISTORY — DX: Other diseases of stomach and duodenum: K31.89

## 2012-11-08 LAB — CREATININE, SERUM: GFR calc Af Amer: >90 mL/min (ref >90)

## 2012-11-08 SURGERY — COLONOSCOPY WITH ESOPHAGOGASTRODUODENOSCOPY (EGD)
Anesthesia: Moderate Sedation

## 2012-11-08 MED ORDER — MIDAZOLAM HCL 5 MG/5ML IJ SOLN
INTRAMUSCULAR | Status: AC
  Filled 2012-11-08: qty 10

## 2012-11-08 MED ORDER — MEPERIDINE HCL 50 MG/ML IJ SOLN
INTRAMUSCULAR | Status: AC
  Filled 2012-11-08: qty 1

## 2012-11-08 MED ORDER — BUTAMBEN-TETRACAINE-BENZOCAINE 2-2-14 % EX AERO
INHALATION_SPRAY | CUTANEOUS | Status: DC | PRN
  Administered 2012-11-08: 2 via TOPICAL

## 2012-11-08 MED ORDER — MEPERIDINE HCL 50 MG/ML IJ SOLN
INTRAMUSCULAR | Status: DC | PRN
  Administered 2012-11-08 (×2): 25 mg via INTRAVENOUS

## 2012-11-08 MED ORDER — SODIUM CHLORIDE 0.9 % IV SOLN
INTRAVENOUS | Status: DC
  Administered 2012-11-08: 1000 mL via INTRAVENOUS

## 2012-11-08 MED ORDER — STERILE WATER FOR IRRIGATION IR SOLN
Status: DC | PRN
  Administered 2012-11-08: 08:00:00

## 2012-11-08 MED ORDER — MIDAZOLAM HCL 5 MG/5ML IJ SOLN
INTRAMUSCULAR | Status: DC | PRN
  Administered 2012-11-08 (×5): 2 mg via INTRAVENOUS

## 2012-11-08 NOTE — Telephone Encounter (Signed)
Message copied by Idamae Schuller on Tue Nov 08, 2012 10:16 AM ------      Message from: Danie Binder      Created: Tue Nov 08, 2012  9:08 AM       Please note pt is NOW A NUR PT ------

## 2012-11-08 NOTE — H&P (Signed)
Donald Gaines is an 72 y.o. male.   Chief Complaint: Patient's here for EGD and colonoscopy. HPI: Patient is 72 year old Caucasian male with multiple medical problems who is on Plavix chronically who presents with iron deficiency anemia and heme-positive stools. He denies nausea vomiting hematemesis melena or rectal bleeding. He was initially evaluated by Dr. Wende Neighbors and noted to have 3 out of 3 heme-positive stools. He was guaiac-negative in her office. He complains of weakness anorexia and has lost 12 pounds. She does not take OTC NSAIDs. He has history of iron deficiency anemia and was initially evaluated in February and April 2008 by Dr. Barney Drain and noted to have colonic ulcers adenomas as well as angiodysplasia. He also had small bowel given capsule study at that time. His last colonoscopy was over 4 years ago in 2009. Patient does not drink alcohol but he smokes a pack a day Family history is negative for CRC.  Past Medical History  Diagnosis Date  . TIA (transient ischemic attack)     2005  . GERD (gastroesophageal reflux disease)   . Hyperlipidemia   . Hypertension   . Depression   . Diabetes mellitus, type 2   . Restrictive lung disease   . Candida esophagitis   . Hemorrhoids   . Arteriovenous malformation small bowel     Capsule study 3/10  . GI bleeding     Cecal ulcers, arteriovenous malformations  . Chronic diarrhea SEP 2011    ?ETIOLOGY-DIABETIC ENTEROPATHY, LACTOSE INTOLERANCE,  OR IBS-MIXED  . Bloating AUG 2009    NL HBT FOR SIBO  . Anemia FEB 2008 FEDA 2o to AVMs, & large colon ulcers    HB 10.7 MCV 74.3; Dr Tressie Stalker  . BPH (benign prostatic hypertrophy)     Dr Maryland Pink  . Glaucoma   . Cataract   . ASCVD (arteriosclerotic cardiovascular disease)     LIMA to LAD and LIMA to intermediate branch, SVG to PD and second OM. 06/03/93  . Bilateral carotid artery stenosis     50-75% external left carotid stenosis  . Coronary atherosclerosis of native coronary  artery     Multivessel, BMS SVG TO RCA 2000, reportedly  "2" additional stents in interim  . Myocardial infarction     1994  . Stroke     mini- strokes in past.States has weakness and numbness of right leg. Short term memory loss  . COPD (chronic obstructive pulmonary disease)   . Sleep apnea     Past Surgical History  Procedure Laterality Date  . Esophagogastroduodenoscopy  SEP 09    DUODENAL LIPOMA  . Capsule endo  03/10  . Right inguinal hernia repair    . Hydrogen breath test  2009  . Colonoscopy  APR 2008    SIMPLE ADENOMA, [place] TICS, IH  . Colonoscopy  FEB 2008 ANEMIA. MELENA     2 LARGE ILEOCEAL ULCERS 2o to ASA, [place] TICS, IH  . Colonoscopy  SEP 2009 TRANSFUSION DEP ANEMIA    AC AVM-ABLATED, [place] TICS, IH  . Upper gastrointestinal endoscopy  SEP 2009    GASTRIC AVM ABLATED, NL DUO Bx  . Cardiac catherization  04/2012  . Cardiac catheterization  04/05/1996 [facility]    EF 45%, occluded SVG to circumflex, patent SVG to D1 and PDA and patent LIMA to LAD with severe native vessel disease.  . Cardiac catheterization  08/22/1998 [facility]    Mild LM, severe LAD,severe CX, occlude RCA, patient  SVG to diatal RCA,  patent vein graft to D1 and patent LIMA to LAD.   Marland Kitchen Coronary angioplasty with stent placement  08/22/1998 [name]    TEC stenting of the SVG to RCA-Last seen by cardiologist in 2010.  Marland Kitchen Coronary artery bypass graft  1995-triple bypass    LIMA-LAD, LIMA to intermediate branch, SVG to PD and second OM,  . Cataract extraction w/phaco Right 09/22/2012    Procedure: CATARACT EXTRACTION PHACO AND INTRAOCULAR LENS PLACEMENT (IOC);  Surgeon: Tonny Branch, MD;  Location: [facility];  Service: Ophthalmology;  Laterality: Right;  CDE: 11.43  . Cataract extraction w/phaco Left 10/17/2012    Procedure: CATARACT EXTRACTION PHACO AND INTRAOCULAR LENS PLACEMENT (IOC);  Surgeon: Tonny Branch, MD;  Location: [facility];  Service: Ophthalmology;  Laterality: Left;  CDE: 8.06    Family History   Problem Relation Age of Onset  . Heart attack Mother   . Heart disease Mother   . Hypertension Mother    Social History:  reports that he has been smoking Cigarettes.  He has a 60 pack-year smoking history. He has never used smokeless tobacco. He reports that he does not drink alcohol or use illicit drugs.  Allergies:  Allergies  Allergen Reactions  . Penicillins Rash    Medications Prior to Admission  Medication Sig Dispense Refill  . ADVAIR DISKUS 250-50 MCG/DOSE AEPB Inhale 1 puff into the lungs 2 (two) times daily.       Marland Kitchen albuterol (PROVENTIL HFA;VENTOLIN HFA) 108 (90 BASE) MCG/ACT inhaler Inhale 2 puffs into the lungs every 6 (six) hours as needed for wheezing.      Marland Kitchen ALPRAZolam (XANAX) 0.5 MG tablet Take 0.5 mg by mouth 2 (two) times daily as needed for anxiety.       . Bromfenac Sodium (PROLENSA) 0.07 % SOLN Place 1 drop into the left eye daily.       . Carboxymethylcellul-Glycerin (REFRESH OPTIVE OP) Place 1 drop into the right eye daily as needed (Dry Eyes).       . carvedilol (COREG) 6.25 MG tablet Take 6.25 mg by mouth 2 (two) times daily with a meal.       . Difluprednate (DUREZOL) 0.05 % EMUL Place 1 drop into the left eye 3 (three) times daily.       Marland Kitchen FLUoxetine (PROZAC) 20 MG capsule Take 20 mg by mouth 2 (two) times daily.      . furosemide (LASIX) 40 MG tablet Take 40 mg by mouth daily.       . hydrocodone-acetaminophen (LORCET-HD) 5-500 MG per capsule Take 1 capsule by mouth every 6 (six) hours as needed for pain.      . isosorbide mononitrate (IMDUR) 60 MG 24 hr tablet Take 60 mg by mouth daily.      Marland Kitchen losartan (COZAAR) 50 MG tablet Take 50 mg by mouth daily.       Marland Kitchen MYRBETRIQ 25 MG TB24 Take 25 mg by mouth daily.       Marland Kitchen NITROSTAT 0.4 MG SL tablet Place 0.4 mg under the tongue every 5 (five) minutes as needed for chest pain.       Marland Kitchen omeprazole (PRILOSEC) 20 MG capsule Take 20 mg by mouth 2 (two) times daily.       . peg 3350 powder (MOVIPREP) 100 G SOLR Take 1  kit (200 g total) by mouth once.  1 kit  0  . rosuvastatin (CRESTOR) 20 MG tablet Take 20 mg by mouth daily.      Marland Kitchen  sitaGLIPtan-metformin (JANUMET) 50-1000 MG per tablet Take 1 tablet by mouth 2 (two) times daily with a meal.        . Tamsulosin HCl (FLOMAX) 0.4 MG CAPS Take 0.4 mg by mouth daily. Takes in AM      . traMADol (ULTRAM) 50 MG tablet Take 50 mg by mouth every 8 (eight) hours as needed for pain.      Marland Kitchen clopidogrel (PLAVIX) 75 MG tablet Take 75 mg by mouth daily.        Results for orders placed during the hospital encounter of 11/08/12 (from the past 48 hour(s))  GLUCOSE, CAPILLARY     Status: Abnormal   Collection Time    11/08/12  7:11 AM      Result Value Range   Glucose-Capillary 115 (*) 70 - 99 mg/dL   No results found.  ROS  Blood pressure 112/97, pulse 66, temperature 97.5 F (36.4 C), temperature source Oral, resp. rate 22, height 5' 5.5" (1.664 m), weight 182 lb (82.555 kg), SpO2 92.00%. Physical Exam  Constitutional: He appears well-developed and well-nourished.  HENT:  Mouth/Throat: Oropharynx is clear and moist.  Eyes: No scleral icterus.  Conjunctiva is pale  Neck: No thyromegaly present.  Cardiovascular: Normal rate, regular rhythm and normal heart sounds.   No murmur heard. Respiratory: Effort normal and breath sounds normal.  GI: Soft. He exhibits no distension and no mass. There is no tenderness.  Musculoskeletal: He exhibits no edema.  Lymphadenopathy:    He has no cervical adenopathy.  Neurological: He is alert.  Skin: Skin is warm and dry.     Assessment/Plan Iron deficiency anemia and heme positive stools. History of colonic adenomas, colonic ulcers and GI angiodysplasia. EGD and colonoscopy.  REHMAN,NAJEEB U 11/08/2012, 7:37 AM

## 2012-11-08 NOTE — Telephone Encounter (Signed)
I tried to call the patient and make him aware that he needed to make an appt with Dr. Laural Golden, no answer.

## 2012-11-08 NOTE — Op Note (Addendum)
EGD AND COLONOSCOPY  PROCEDURE REPORT  PATIENT:  Donald Gaines  MR#:  [id number] Birthdate:  [birth year], 72 y.o., male Endoscopist:  Dr. Rogene Houston, MD Referred By:  Dr. Wende Neighbors, MD Procedure Date: 11/08/2012  Procedure:   EGD & Colonoscopy  Indications:  Patient is 72 year old Caucasian male with multiple medical problems who is on Plavix chronically who presents with iron deficiency anemia and heme-positive stools. He has history of GI angiodysplasia colonic adenomas and colonic ulcers. He was initially evaluated in 2000 age and 2009.            Informed Consent:  The risks, benefits, alternatives & imponderables which include, but are not limited to, bleeding, infection, perforation, drug reaction and potential missed lesion have been reviewed.  The potential for biopsy, lesion removal, esophageal dilation, etc. have also been discussed.  Questions have been answered.  All parties agreeable.  Please see history & physical in medical record for more information.  Medications:  Demerol 50 mg IV Versed 10 mg IV Cetacaine spray topically for oropharyngeal anesthesia  EGD  Description of procedure:  The endoscope was introduced through the mouth and advanced to the second portion of the duodenum without difficulty or limitations. The mucosal surfaces were surveyed very carefully during advancement of the scope and upon withdrawal.  Findings:  Esophagus:  Normal mucosa of the esophagus. Unremarkable GE junction. GEJ:  43 cm Stomach:  Stomach was empty and distended very well with insufflation. Folds in the proximal stomach are normal. Examination mucosa gastric body revealed mosaic pattern and a few red areas. There was focal antral erythema but no erosions or ulcers were noted. Pyloric channel was patent. Angularis fundus and cardia were examined by retroflex in the scope. Mucosa at fundus revealed changes similar to the ones at gastric body. No fundal varices are  identified. Duodenum:  Normal bulbar and post bulbar mucosa.  Therapeutic/Diagnostic Maneuvers Performed:  None  COLONOSCOPY Description of procedure:  After a digital rectal exam was performed, that colonoscope was advanced from the anus through the rectum and colon to the area of the cecum, ileocecal valve and appendiceal orifice. The cecum was deeply intubated. These structures were well-seen and photographed for the record. From the level of the cecum and ileocecal valve, the scope was slowly and cautiously withdrawn. The mucosal surfaces were carefully surveyed utilizing scope tip to flexion to facilitate fold flattening as needed. The scope was pulled down into the rectum where a thorough exam including retroflexion was performed. Terminal ileum was also examined.  Findings:   Prep excellent. Normal mucosa of terminal ileum. Five small cecal polyps removed; three via cold snare and two while cold biopsy. All of these polyps were submitted together. Single small cecal AV malformation which was not bleeding and was left alone. 3 mm rectal polyp cold snared. Prominent hemorrhoids below the dentate line.  Therapeutic/Diagnostic Maneuvers Performed:  See above  Complications:  None  Cecal Withdrawal Time:   21 minutes  Impression:  Portal gastropathy otherwise normal EGD. Normal mucosa of terminal ileum. Single small cecal AVM without stigmata of bleed. 5 small cecal polyps removed as above. These polyps were 4-5 mm in size. Small rectal polyp cold snared. External hemorrhoids.   Recommendations:  Will check H&H today as he appears quite pale. Abdominal pelvic CT with contrast looking for changes of portal hypertension/cirrhosis. He will also need small bowel given capsule study.  Donald Gaines,Donald Gaines  11/08/2012 8:40 AM  CC: Dr. Delphina Cahill, MD &  Dr. No ref. provider found

## 2012-11-09 ENCOUNTER — Telehealth (INDEPENDENT_AMBULATORY_CARE_PROVIDER_SITE_OTHER): Payer: Self-pay | Admitting: *Deleted

## 2012-11-09 ENCOUNTER — Encounter (HOSPITAL_COMMUNITY): Payer: Self-pay | Admitting: Internal Medicine

## 2012-11-09 NOTE — Telephone Encounter (Signed)
I have called the patient's [employer] . I spoke with Lorriane Shire who states that a prior approval is not needed for this service. An order was faxed to [facility] for the arrangements to be made for these infusions.

## 2012-11-10 ENCOUNTER — Encounter (HOSPITAL_COMMUNITY)
Admission: RE | Admit: 2012-11-10 | Discharge: 2012-11-10 | Disposition: A | Discharge status: Home / Self Care | Payer: Medicare Other | Source: Ambulatory Visit | Attending: Internal Medicine | Admitting: Internal Medicine

## 2012-11-10 ENCOUNTER — Encounter (INDEPENDENT_AMBULATORY_CARE_PROVIDER_SITE_OTHER): Payer: Self-pay

## 2012-11-10 DIAGNOSIS — D649 Anemia, unspecified: Secondary | ICD-10-CM | POA: Insufficient documentation

## 2012-11-10 MED ORDER — FERUMOXYTOL INJECTION 510 MG/17 ML
510.0000 mg | Freq: Once | INTRAVENOUS | Status: AC
  Administered 2012-11-10: 510 mg via INTRAVENOUS
  Filled 2012-11-10: qty 17

## 2012-11-10 MED ORDER — SODIUM CHLORIDE 0.9 % IV SOLN
INTRAVENOUS | Status: DC
  Administered 2012-11-10: 11:00:00 via INTRAVENOUS

## 2012-11-10 MED ORDER — SODIUM CHLORIDE 0.9 % IJ SOLN
10.0000 mL | Freq: Once | INTRAMUSCULAR | Status: AC
  Administered 2012-11-10: 10 mL via INTRAVENOUS

## 2012-11-11 ENCOUNTER — Encounter (INDEPENDENT_AMBULATORY_CARE_PROVIDER_SITE_OTHER): Payer: Self-pay | Admitting: *Deleted

## 2012-11-11 ENCOUNTER — Other Ambulatory Visit (INDEPENDENT_AMBULATORY_CARE_PROVIDER_SITE_OTHER): Payer: Self-pay | Admitting: *Deleted

## 2012-11-11 DIAGNOSIS — R195 Other fecal abnormalities: Secondary | ICD-10-CM

## 2012-11-14 ENCOUNTER — Encounter (HOSPITAL_COMMUNITY): Payer: Self-pay | Admitting: Pharmacy Technician

## 2012-11-15 ENCOUNTER — Emergency Department (HOSPITAL_COMMUNITY): Payer: Medicare Other

## 2012-11-15 ENCOUNTER — Inpatient Hospital Stay (HOSPITAL_COMMUNITY)
Admission: EM | Admit: 2012-11-15 | Discharge: 2012-11-18 | DRG: 193 | Disposition: A | Discharge status: Home / Self Care | Payer: Medicare Other | Attending: Internal Medicine | Admitting: Internal Medicine

## 2012-11-15 ENCOUNTER — Encounter (HOSPITAL_COMMUNITY): Payer: Self-pay | Admitting: *Deleted

## 2012-11-15 DIAGNOSIS — I509 Heart failure, unspecified: Secondary | ICD-10-CM | POA: Diagnosis present

## 2012-11-15 DIAGNOSIS — I10 Essential (primary) hypertension: Secondary | ICD-10-CM | POA: Diagnosis present

## 2012-11-15 DIAGNOSIS — F329 Major depressive disorder, single episode, unspecified: Secondary | ICD-10-CM | POA: Diagnosis present

## 2012-11-15 DIAGNOSIS — J9601 Acute respiratory failure with hypoxia: Secondary | ICD-10-CM | POA: Diagnosis present

## 2012-11-15 DIAGNOSIS — N400 Enlarged prostate without lower urinary tract symptoms: Secondary | ICD-10-CM | POA: Diagnosis present

## 2012-11-15 DIAGNOSIS — D500 Iron deficiency anemia secondary to blood loss (chronic): Secondary | ICD-10-CM | POA: Diagnosis present

## 2012-11-15 DIAGNOSIS — I252 Old myocardial infarction: Secondary | ICD-10-CM

## 2012-11-15 DIAGNOSIS — J189 Pneumonia, unspecified organism: Principal | ICD-10-CM | POA: Diagnosis present

## 2012-11-15 DIAGNOSIS — Z9861 Coronary angioplasty status: Secondary | ICD-10-CM

## 2012-11-15 DIAGNOSIS — I658 Occlusion and stenosis of other precerebral arteries: Secondary | ICD-10-CM | POA: Diagnosis present

## 2012-11-15 DIAGNOSIS — D509 Iron deficiency anemia, unspecified: Secondary | ICD-10-CM | POA: Diagnosis present

## 2012-11-15 DIAGNOSIS — Z8673 Personal history of transient ischemic attack (TIA), and cerebral infarction without residual deficits: Secondary | ICD-10-CM

## 2012-11-15 DIAGNOSIS — K922 Gastrointestinal hemorrhage, unspecified: Secondary | ICD-10-CM | POA: Diagnosis present

## 2012-11-15 DIAGNOSIS — G4730 Sleep apnea, unspecified: Secondary | ICD-10-CM | POA: Diagnosis present

## 2012-11-15 DIAGNOSIS — F17200 Nicotine dependence, unspecified, uncomplicated: Secondary | ICD-10-CM | POA: Diagnosis present

## 2012-11-15 DIAGNOSIS — K219 Gastro-esophageal reflux disease without esophagitis: Secondary | ICD-10-CM | POA: Diagnosis present

## 2012-11-15 DIAGNOSIS — I2510 Atherosclerotic heart disease of native coronary artery without angina pectoris: Secondary | ICD-10-CM | POA: Diagnosis present

## 2012-11-15 DIAGNOSIS — F3289 Other specified depressive episodes: Secondary | ICD-10-CM | POA: Diagnosis present

## 2012-11-15 DIAGNOSIS — E119 Type 2 diabetes mellitus without complications: Secondary | ICD-10-CM | POA: Diagnosis present

## 2012-11-15 DIAGNOSIS — Z951 Presence of aortocoronary bypass graft: Secondary | ICD-10-CM

## 2012-11-15 DIAGNOSIS — I6529 Occlusion and stenosis of unspecified carotid artery: Secondary | ICD-10-CM | POA: Diagnosis present

## 2012-11-15 DIAGNOSIS — R0789 Other chest pain: Secondary | ICD-10-CM | POA: Diagnosis present

## 2012-11-15 DIAGNOSIS — J9620 Acute and chronic respiratory failure, unspecified whether with hypoxia or hypercapnia: Secondary | ICD-10-CM | POA: Diagnosis present

## 2012-11-15 DIAGNOSIS — J9600 Acute respiratory failure, unspecified whether with hypoxia or hypercapnia: Secondary | ICD-10-CM

## 2012-11-15 DIAGNOSIS — E785 Hyperlipidemia, unspecified: Secondary | ICD-10-CM | POA: Diagnosis present

## 2012-11-15 DIAGNOSIS — J441 Chronic obstructive pulmonary disease with (acute) exacerbation: Secondary | ICD-10-CM | POA: Diagnosis present

## 2012-11-15 DIAGNOSIS — Z79899 Other long term (current) drug therapy: Secondary | ICD-10-CM

## 2012-11-15 DIAGNOSIS — E876 Hypokalemia: Secondary | ICD-10-CM | POA: Diagnosis present

## 2012-11-15 DIAGNOSIS — Z7902 Long term (current) use of antithrombotics/antiplatelets: Secondary | ICD-10-CM

## 2012-11-15 HISTORY — DX: Other diseases of stomach and duodenum: K31.89

## 2012-11-15 HISTORY — DX: Portal hypertension: K76.6

## 2012-11-15 LAB — HEMOGLOBIN A1C: Mean Plasma Glucose: 126 mg/dL — ABNORMAL HIGH (ref <117)

## 2012-11-15 LAB — CBC WITH DIFFERENTIAL/PLATELET
Basophils Absolute: 0 10*3/uL (ref 0.0–0.1)
Basophils Relative: 0 % (ref 0–1)
Eosinophils Absolute: 0 10*3/uL (ref 0.0–0.7)
Eosinophils Relative: 1 % (ref 0–5)
HCT: 29.2 % — ABNORMAL LOW (ref 39.0–52.0)
Hemoglobin: 8.6 g/dL — ABNORMAL LOW (ref 13.0–17.0)
MCH: 24.6 pg — ABNORMAL LOW (ref 26.0–34.0)
MCHC: 29.5 g/dL — ABNORMAL LOW (ref 30.0–36.0)
MCV: 83.7 fL (ref 78.0–100.0)
Monocytes Absolute: 0.5 10*3/uL (ref 0.1–1.0)
Monocytes Relative: 6 % (ref 3–12)
Neutro Abs: 6.1 10*3/uL (ref 1.7–7.7)
RDW: 20.3 % — ABNORMAL HIGH (ref 11.5–15.5)

## 2012-11-15 LAB — BASIC METABOLIC PANEL
BUN: 13 mg/dL (ref 6–23)
Calcium: 9.3 mg/dL (ref 8.4–10.5)
Chloride: 103 mEq/L (ref 96–112)
Creatinine, Ser: 0.86 mg/dL (ref 0.50–1.35)
GFR calc Af Amer: >90 mL/min (ref >90)

## 2012-11-15 LAB — APTT: aPTT: 40 seconds — ABNORMAL HIGH (ref 24–37)

## 2012-11-15 LAB — OCCULT BLOOD, POC DEVICE: Fecal Occult Bld: POSITIVE — AB

## 2012-11-15 LAB — TROPONIN I: Troponin I: <0.3 ng/mL (ref <0.30)

## 2012-11-15 LAB — TYPE AND SCREEN

## 2012-11-15 LAB — PRO B NATRIURETIC PEPTIDE: Pro B Natriuretic peptide (BNP): 2764 pg/mL — ABNORMAL HIGH (ref 0–125)

## 2012-11-15 LAB — GLUCOSE, CAPILLARY: Glucose-Capillary: 141 mg/dL — ABNORMAL HIGH (ref 70–99)

## 2012-11-15 LAB — MAGNESIUM: Magnesium: 1.8 mg/dL (ref 1.5–2.5)

## 2012-11-15 MED ORDER — INSULIN ASPART 100 UNIT/ML ~~LOC~~ SOLN
0.0000 [IU] | Freq: Three times a day (TID) | SUBCUTANEOUS | Status: DC
  Administered 2012-11-16: 2 [IU] via SUBCUTANEOUS

## 2012-11-15 MED ORDER — PREDNISONE 20 MG PO TABS
60.0000 mg | ORAL_TABLET | ORAL | Status: AC
  Administered 2012-11-15: 60 mg via ORAL
  Filled 2012-11-15: qty 3

## 2012-11-15 MED ORDER — LOSARTAN POTASSIUM 50 MG PO TABS
50.0000 mg | ORAL_TABLET | Freq: Every day | ORAL | Status: DC
Start: 2012-11-15 — End: 2012-11-18
  Administered 2012-11-16 – 2012-11-18 (×3): 50 mg via ORAL
  Filled 2012-11-15 (×5): qty 1

## 2012-11-15 MED ORDER — IPRATROPIUM BROMIDE 0.02 % IN SOLN: 0.5000 mg | Freq: Once | RESPIRATORY_TRACT | Status: DC

## 2012-11-15 MED ORDER — ACETAMINOPHEN 325 MG PO TABS: 650.0000 mg | ORAL_TABLET | Freq: Four times a day (QID) | ORAL | Status: DC | PRN

## 2012-11-15 MED ORDER — DEXTROSE 5 % IV SOLN
500.0000 mg | INTRAVENOUS | Status: DC
  Filled 2012-11-15: qty 500

## 2012-11-15 MED ORDER — SODIUM CHLORIDE 0.9 % IJ SOLN
3.0000 mL | Freq: Two times a day (BID) | INTRAMUSCULAR | Status: DC
  Administered 2012-11-15 – 2012-11-17 (×4): 3 mL via INTRAVENOUS

## 2012-11-15 MED ORDER — MIRABEGRON ER 25 MG PO TB24
25.0000 mg | ORAL_TABLET | Freq: Every day | ORAL | Status: DC
  Administered 2012-11-16 – 2012-11-18 (×3): 25 mg via ORAL
  Filled 2012-11-15 (×3): qty 1

## 2012-11-15 MED ORDER — MOMETASONE FURO-FORMOTEROL FUM 100-5 MCG/ACT IN AERO
2.0000 | INHALATION_SPRAY | Freq: Two times a day (BID) | RESPIRATORY_TRACT | Status: DC
  Administered 2012-11-15 – 2012-11-18 (×6): 2 via RESPIRATORY_TRACT
  Filled 2012-11-15: qty 8.8

## 2012-11-15 MED ORDER — CARVEDILOL 12.5 MG PO TABS
6.2500 mg | ORAL_TABLET | Freq: Two times a day (BID) | ORAL | Status: DC
  Administered 2012-11-15 – 2012-11-18 (×6): 6.25 mg via ORAL
  Filled 2012-11-15 (×6): qty 1
  Filled 2012-11-15: qty 0.5

## 2012-11-15 MED ORDER — PANTOPRAZOLE SODIUM 40 MG PO TBEC
40.0000 mg | DELAYED_RELEASE_TABLET | Freq: Every day | ORAL | Status: DC
  Administered 2012-11-15 – 2012-11-18 (×4): 40 mg via ORAL
  Filled 2012-11-15 (×4): qty 1

## 2012-11-15 MED ORDER — POTASSIUM CHLORIDE CRYS ER 20 MEQ PO TBCR
40.0000 meq | EXTENDED_RELEASE_TABLET | Freq: Two times a day (BID) | ORAL | Status: AC
  Administered 2012-11-15 – 2012-11-17 (×4): 40 meq via ORAL
  Filled 2012-11-15 (×4): qty 2

## 2012-11-15 MED ORDER — DOCUSATE SODIUM 100 MG PO CAPS
100.0000 mg | ORAL_CAPSULE | Freq: Two times a day (BID) | ORAL | Status: DC
  Administered 2012-11-15 – 2012-11-18 (×6): 100 mg via ORAL
  Filled 2012-11-15 (×8): qty 1

## 2012-11-15 MED ORDER — NITROGLYCERIN 0.4 MG SL SUBL: 0.4000 mg | SUBLINGUAL_TABLET | SUBLINGUAL | Status: DC | PRN

## 2012-11-15 MED ORDER — HYDROCODONE-ACETAMINOPHEN 5-500 MG PO CAPS: 1.0000 | ORAL_CAPSULE | Freq: Four times a day (QID) | ORAL | Status: DC | PRN

## 2012-11-15 MED ORDER — DEXTROSE 5 % IV SOLN
1.0000 g | INTRAVENOUS | Status: DC
  Administered 2012-11-16: 1 g via INTRAVENOUS
  Filled 2012-11-15: qty 10

## 2012-11-15 MED ORDER — FLUOXETINE HCL 20 MG PO CAPS
20.0000 mg | ORAL_CAPSULE | Freq: Two times a day (BID) | ORAL | Status: DC
  Administered 2012-11-15 – 2012-11-18 (×6): 20 mg via ORAL
  Filled 2012-11-15 (×8): qty 1

## 2012-11-15 MED ORDER — PREDNISONE 20 MG PO TABS
60.0000 mg | ORAL_TABLET | Freq: Every day | ORAL | Status: DC
  Administered 2012-11-16 – 2012-11-17 (×2): 60 mg via ORAL
  Filled 2012-11-15 (×2): qty 3

## 2012-11-15 MED ORDER — ALBUTEROL SULFATE (5 MG/ML) 0.5% IN NEBU: 5.0000 mg | INHALATION_SOLUTION | Freq: Once | RESPIRATORY_TRACT | Status: DC

## 2012-11-15 MED ORDER — ALUM & MAG HYDROXIDE-SIMETH 200-200-20 MG/5ML PO SUSP: 30.0000 mL | Freq: Four times a day (QID) | ORAL | Status: DC | PRN

## 2012-11-15 MED ORDER — DEXTROSE 5 % IV SOLN
500.0000 mg | Freq: Once | INTRAVENOUS | Status: AC
  Administered 2012-11-15: 500 mg via INTRAVENOUS
  Filled 2012-11-15 (×2): qty 500

## 2012-11-15 MED ORDER — ONDANSETRON HCL 4 MG PO TABS
4.0000 mg | ORAL_TABLET | Freq: Four times a day (QID) | ORAL | Status: DC | PRN
  Administered 2012-11-17: 4 mg via ORAL
  Filled 2012-11-15: qty 1

## 2012-11-15 MED ORDER — POTASSIUM CHLORIDE CRYS ER 20 MEQ PO TBCR
40.0000 meq | EXTENDED_RELEASE_TABLET | Freq: Once | ORAL | Status: AC
  Administered 2012-11-15: 40 meq via ORAL
  Filled 2012-11-15: qty 2

## 2012-11-15 MED ORDER — CLOPIDOGREL BISULFATE 75 MG PO TABS
75.0000 mg | ORAL_TABLET | Freq: Every day | ORAL | Status: DC
  Administered 2012-11-16 – 2012-11-18 (×3): 75 mg via ORAL
  Filled 2012-11-15 (×4): qty 1

## 2012-11-15 MED ORDER — DEXTROSE 5 % IV SOLN
1.0000 g | Freq: Once | INTRAVENOUS | Status: AC
  Administered 2012-11-15: 1 g via INTRAVENOUS
  Filled 2012-11-15: qty 10

## 2012-11-15 MED ORDER — ALBUTEROL SULFATE (5 MG/ML) 0.5% IN NEBU
5.0000 mg | INHALATION_SOLUTION | Freq: Once | RESPIRATORY_TRACT | Status: AC
  Administered 2012-11-15: 5 mg via RESPIRATORY_TRACT
  Filled 2012-11-15: qty 1

## 2012-11-15 MED ORDER — HYDROCODONE-ACETAMINOPHEN 5-325 MG PO TABS: 1.0000 | ORAL_TABLET | Freq: Four times a day (QID) | ORAL | Status: DC | PRN

## 2012-11-15 MED ORDER — NICOTINE 21 MG/24HR TD PT24
21.0000 mg | MEDICATED_PATCH | Freq: Every day | TRANSDERMAL | Status: DC
  Administered 2012-11-15 – 2012-11-18 (×4): 21 mg via TRANSDERMAL
  Filled 2012-11-15 (×4): qty 1

## 2012-11-15 MED ORDER — FUROSEMIDE 40 MG PO TABS
40.0000 mg | ORAL_TABLET | Freq: Every day | ORAL | Status: DC
  Administered 2012-11-17 – 2012-11-18 (×2): 40 mg via ORAL
  Filled 2012-11-15 (×2): qty 1

## 2012-11-15 MED ORDER — IPRATROPIUM BROMIDE 0.02 % IN SOLN
0.5000 mg | RESPIRATORY_TRACT | Status: DC
  Administered 2012-11-15 – 2012-11-16 (×5): 0.5 mg via RESPIRATORY_TRACT
  Filled 2012-11-15 (×5): qty 2.5

## 2012-11-15 MED ORDER — FUROSEMIDE 10 MG/ML IJ SOLN
80.0000 mg | Freq: Once | INTRAMUSCULAR | Status: AC
  Administered 2012-11-15: 80 mg via INTRAVENOUS
  Filled 2012-11-15: qty 4

## 2012-11-15 MED ORDER — ONDANSETRON HCL 4 MG/2ML IJ SOLN: 4.0000 mg | Freq: Four times a day (QID) | INTRAMUSCULAR | Status: DC | PRN

## 2012-11-15 MED ORDER — INSULIN ASPART 100 UNIT/ML ~~LOC~~ SOLN: 0.0000 [IU] | Freq: Every day | SUBCUTANEOUS | Status: DC

## 2012-11-15 MED ORDER — DIFLUPREDNATE 0.05 % OP EMUL
1.0000 [drp] | Freq: Three times a day (TID) | OPHTHALMIC | Status: DC
  Filled 2012-11-15 (×6): qty 0.1

## 2012-11-15 MED ORDER — BENZONATATE 100 MG PO CAPS
100.0000 mg | ORAL_CAPSULE | Freq: Three times a day (TID) | ORAL | Status: DC
  Administered 2012-11-15 – 2012-11-18 (×9): 100 mg via ORAL
  Filled 2012-11-15 (×9): qty 1

## 2012-11-15 MED ORDER — TAMSULOSIN HCL 0.4 MG PO CAPS
0.4000 mg | ORAL_CAPSULE | Freq: Every day | ORAL | Status: DC
  Administered 2012-11-15 – 2012-11-18 (×4): 0.4 mg via ORAL
  Filled 2012-11-15 (×6): qty 1

## 2012-11-15 MED ORDER — KETOROLAC TROMETHAMINE 0.5 % OP SOLN
1.0000 [drp] | Freq: Every day | OPHTHALMIC | Status: DC
  Administered 2012-11-15 – 2012-11-16 (×2): 1 [drp] via OPHTHALMIC
  Filled 2012-11-15: qty 3

## 2012-11-15 MED ORDER — ACETAMINOPHEN 650 MG RE SUPP: 650.0000 mg | Freq: Four times a day (QID) | RECTAL | Status: DC | PRN

## 2012-11-15 MED ORDER — ISOSORBIDE MONONITRATE ER 60 MG PO TB24
60.0000 mg | ORAL_TABLET | Freq: Every day | ORAL | Status: DC
  Administered 2012-11-15 – 2012-11-18 (×4): 60 mg via ORAL
  Filled 2012-11-15 (×6): qty 1

## 2012-11-15 MED ORDER — HYDROCOD POLST-CHLORPHEN POLST 10-8 MG/5ML PO LQCR
5.0000 mL | Freq: Two times a day (BID) | ORAL | Status: DC | PRN
  Administered 2012-11-17: 5 mL via ORAL
  Filled 2012-11-15: qty 5

## 2012-11-15 MED ORDER — ATORVASTATIN CALCIUM 40 MG PO TABS
40.0000 mg | ORAL_TABLET | Freq: Every day | ORAL | Status: DC
  Administered 2012-11-16 – 2012-11-17 (×2): 40 mg via ORAL
  Filled 2012-11-15 (×4): qty 1

## 2012-11-15 MED ORDER — ALBUTEROL SULFATE (5 MG/ML) 0.5% IN NEBU
2.5000 mg | INHALATION_SOLUTION | RESPIRATORY_TRACT | Status: DC
  Administered 2012-11-15 – 2012-11-16 (×5): 2.5 mg via RESPIRATORY_TRACT
  Filled 2012-11-15 (×5): qty 0.5

## 2012-11-15 MED ORDER — ALPRAZOLAM 0.5 MG PO TABS
0.5000 mg | ORAL_TABLET | Freq: Two times a day (BID) | ORAL | Status: DC | PRN
  Administered 2012-11-16 – 2012-11-17 (×2): 0.5 mg via ORAL
  Filled 2012-11-15 (×2): qty 1

## 2012-11-15 NOTE — ED Notes (Signed)
zithromax infusing at this time without difficulty.

## 2012-11-15 NOTE — ED Notes (Addendum)
Pt c/o sob that has gotten worse over the past two nights, hard for pt to sleep laying down due to being sob, chest pain that has been intermittent over the past two weeks located across general chest area. Admits nausea, weakness this am, does state that he took one nitro tablet sl this am with no change in pain or sob. Pulse ox on RA 88-89%, pt placed on oxygen at 2lpm via [place] with sat increased to 93%.

## 2012-11-15 NOTE — ED Provider Notes (Signed)
CSN: [id number]     Arrival date & time 11/15/12  1236 History   First MD Initiated Contact with Patient 11/15/12 1308     Chief Complaint  Patient presents with  . Chest Pain  . Shortness of Breath   (Consider location/radiation/quality/duration/timing/severity/associated sxs/prior Treatment) Patient is a 72 y.o. male presenting with chest pain and shortness of breath.  Chest Pain Associated symptoms: shortness of breath   Shortness of Breath Associated symptoms: chest pain    Pt with numerous medical problems including CAD, CABG, chronic anemia from AVM, and COPD reports increasing SOB and orthopnea over the last several days since colonoscopy done on 8/19 when he also had several polyps removed. He has had occasional CP during that time, relieved with NTG and cough productive of clear sputum. He does not wear home O2 and does not use nebs. He has inhaler as needed. Denies CP at the time of my evaluation. No leg swelling. No bloody or melanic stools, states stool has been 'orange'. Pt hypoxic on arrival on room air and placed in [place] [facility] with some improvement.   Past Medical History  Diagnosis Date  . TIA (transient ischemic attack)     2005  . GERD (gastroesophageal reflux disease)   . Hyperlipidemia   . Hypertension   . Depression   . Diabetes mellitus, type 2   . Restrictive lung disease   . Candida esophagitis   . Hemorrhoids   . Arteriovenous malformation small bowel     Capsule study 3/10  . GI bleeding     Cecal ulcers, arteriovenous malformations  . Chronic diarrhea SEP 2011    ?ETIOLOGY-DIABETIC ENTEROPATHY, LACTOSE INTOLERANCE,  OR IBS-MIXED  . Bloating AUG 2009    NL HBT FOR SIBO  . Anemia FEB 2008 FEDA 2o to AVMs, & large colon ulcers    HB 10.7 MCV 74.3; Dr Tressie Stalker  . BPH (benign prostatic hypertrophy)     Dr [place] Pink  . Glaucoma   . Cataract   . ASCVD (arteriosclerotic cardiovascular disease)     LIMA to LAD and LIMA to intermediate branch, SVG to PD and  second OM. 06/03/93  . Bilateral carotid artery stenosis     50-75% external left carotid stenosis  . Coronary atherosclerosis of native coronary artery     Multivessel, BMS SVG TO RCA 2000, reportedly  "2" additional stents in interim  . Myocardial infarction     1994  . Stroke     mini- strokes in past.States has weakness and numbness of right leg. Short term memory loss  . COPD (chronic obstructive pulmonary disease)   . Sleep apnea    Past Surgical History  Procedure Laterality Date  . Esophagogastroduodenoscopy  SEP 09    DUODENAL LIPOMA  . Capsule endo  03/10  . Right inguinal hernia repair    . Hydrogen breath test  2009  . Colonoscopy  APR 2008    SIMPLE ADENOMA, [place] TICS, IH  . Colonoscopy  FEB 2008 ANEMIA. MELENA     2 LARGE ILEOCEAL ULCERS 2o to ASA, [place] TICS, IH  . Colonoscopy  SEP 2009 TRANSFUSION DEP ANEMIA    AC AVM-ABLATED, [place] TICS, IH  . Upper gastrointestinal endoscopy  SEP 2009    GASTRIC AVM ABLATED, NL DUO Bx  . Cardiac catherization  04/2012  . Cardiac catheterization  04/05/1996 [facility]    EF 45%, occluded SVG to circumflex, patent SVG to D1 and PDA and patent LIMA to LAD with severe  native vessel disease.  . Cardiac catheterization  08/22/1998 [facility]    Mild LM, severe LAD,severe CX, occlude RCA, patient  SVG to diatal RCA, patent vein graft to D1 and patent LIMA to LAD.   Marland Kitchen Coronary angioplasty with stent placement  08/22/1998 [name]    TEC stenting of the SVG to RCA-Last seen by cardiologist in 2010.  Marland Kitchen Coronary artery bypass graft  1995-triple bypass    LIMA-LAD, LIMA to intermediate branch, SVG to PD and second OM,  . Cataract extraction w/phaco Right 09/22/2012    Procedure: CATARACT EXTRACTION PHACO AND INTRAOCULAR LENS PLACEMENT (IOC);  Surgeon: Tonny Branch, MD;  Location: [facility];  Service: Ophthalmology;  Laterality: Right;  CDE: 11.43  . Cataract extraction w/phaco Left 10/17/2012    Procedure: CATARACT EXTRACTION PHACO AND INTRAOCULAR  LENS PLACEMENT (IOC);  Surgeon: Tonny Branch, MD;  Location: [facility];  Service: Ophthalmology;  Laterality: Left;  CDE: 8.06  . Colonoscopy with esophagogastroduodenoscopy (egd) N/A 11/08/2012    Procedure: COLONOSCOPY WITH ESOPHAGOGASTRODUODENOSCOPY (EGD);  Surgeon: Rogene Houston, MD;  Location: [facility] SUITE;  Service: Endoscopy;  Laterality: N/A;  250-rescheduled to 7:30 Ann to notify pt   Family History  Problem Relation Age of Onset  . Heart attack Mother   . Heart disease Mother   . Hypertension Mother    History  Substance Use Topics  . Smoking status: Current Every Day Smoker -- 1.00 packs/day for 60 years    Types: Cigarettes  . Smokeless tobacco: Never Used     Comment: 1 pack a day  . Alcohol Use: No     Comment: No etoh in 12 yrs.    Review of Systems  Respiratory: Positive for shortness of breath.   Cardiovascular: Positive for chest pain.  All other systems reviewed and are negative except as noted in HPI.    Allergies  Penicillins  Home Medications   Current Outpatient Rx  Name  Route  Sig  Dispense  Refill  . ADVAIR DISKUS 250-50 MCG/DOSE AEPB   Inhalation   Inhale 1 puff into the lungs 2 (two) times daily.          Marland Kitchen albuterol (PROVENTIL HFA;VENTOLIN HFA) 108 (90 BASE) MCG/ACT inhaler   Inhalation   Inhale 2 puffs into the lungs every 6 (six) hours as needed for wheezing.         Marland Kitchen ALPRAZolam (XANAX) 0.5 MG tablet   Oral   Take 0.5 mg by mouth 2 (two) times daily as needed for anxiety.          . Bromfenac Sodium (PROLENSA) 0.07 % SOLN   Left Eye   Place 1 drop into the left eye daily.          . Carboxymethylcellul-Glycerin (REFRESH OPTIVE OP)   Right Eye   Place 1 drop into the right eye daily as needed (Dry Eyes).          . carvedilol (COREG) 6.25 MG tablet   Oral   Take 6.25 mg by mouth 2 (two) times daily with a meal.          . clopidogrel (PLAVIX) 75 MG tablet   Oral   Take 75 mg by mouth daily.         . Difluprednate  (DUREZOL) 0.05 % EMUL   Left Eye   Place 1 drop into the left eye 3 (three) times daily.          Marland Kitchen FLUoxetine (PROZAC) 20  MG capsule   Oral   Take 20 mg by mouth 2 (two) times daily.         . furosemide (LASIX) 40 MG tablet   Oral   Take 40 mg by mouth daily.          . hydrocodone-acetaminophen (LORCET-HD) 5-500 MG per capsule   Oral   Take 1 capsule by mouth every 6 (six) hours as needed for pain.         . isosorbide mononitrate (IMDUR) 60 MG 24 hr tablet   Oral   Take 60 mg by mouth daily.         Marland Kitchen losartan (COZAAR) 50 MG tablet   Oral   Take 50 mg by mouth daily.          Marland Kitchen MYRBETRIQ 25 MG TB24   Oral   Take 25 mg by mouth daily.          Marland Kitchen NITROSTAT 0.4 MG SL tablet   Sublingual   Place 0.4 mg under the tongue every 5 (five) minutes as needed for chest pain.          Marland Kitchen omeprazole (PRILOSEC) 20 MG capsule   Oral   Take 20 mg by mouth 2 (two) times daily.          . rosuvastatin (CRESTOR) 20 MG tablet   Oral   Take 20 mg by mouth daily.         . sitaGLIPtan-metformin (JANUMET) 50-1000 MG per tablet   Oral   Take 1 tablet by mouth 2 (two) times daily with a meal.           . Tamsulosin HCl (FLOMAX) 0.4 MG CAPS   Oral   Take 0.4 mg by mouth daily. Takes in AM         . traMADol (ULTRAM) 50 MG tablet   Oral   Take 50 mg by mouth every 8 (eight) hours as needed for pain.          BP 144/62  Pulse 63  Temp(Src) 97.6 F (36.4 C) (Oral)  Resp 24  SpO2 94% Physical Exam  Nursing note and vitals reviewed. Constitutional: He is oriented to person, place, and time. He appears well-developed and well-nourished.  HENT:  Head: Normocephalic and atraumatic.  Eyes: EOM are normal. Pupils are equal, round, and reactive to light.  Neck: Normal range of motion. Neck supple.  Cardiovascular: Normal rate, normal heart sounds and intact distal pulses.   Pulmonary/Chest: Effort normal. He has wheezes. He has rales. He exhibits no  tenderness.  Speaks in broken sentences  Abdominal: Bowel sounds are normal. He exhibits no distension. There is tenderness (diffuse mild). There is no rebound and no guarding.  Genitourinary:  Non tender, stool is light colored, no masses or bleeding  Musculoskeletal: Normal range of motion. He exhibits no edema and no tenderness.  Neurological: He is alert and oriented to person, place, and time. He has normal strength. No cranial nerve deficit or sensory deficit.  Skin: Skin is warm and dry. No rash noted.  Psychiatric: He has a normal mood and affect.    ED Course  Procedures (including critical care time) Labs Review Labs Reviewed  CBC WITH DIFFERENTIAL - Abnormal; Notable for the following:    RBC 3.49 (*)    Hemoglobin 8.6 (*)    HCT 29.2 (*)    MCH 24.6 (*)    MCHC 29.5 (*)    RDW 20.3 (*)  Neutrophils Relative % 82 (*)    Lymphocytes Relative 11 (*)    All other components within normal limits  BASIC METABOLIC PANEL - Abnormal; Notable for the following:    Potassium 3.0 (*)    Glucose, Bld 138 (*)    GFR calc non Af Amer 86 (*)    All other components within normal limits  APTT - Abnormal; Notable for the following:    aPTT 40 (*)    All other components within normal limits  PRO B NATRIURETIC PEPTIDE - Abnormal; Notable for the following:    Pro B Natriuretic peptide (BNP) 2764.0 (*)    All other components within normal limits  OCCULT BLOOD, POC DEVICE - Abnormal; Notable for the following:    Fecal Occult Bld POSITIVE (*)    All other components within normal limits  TROPONIN I  PROTIME-INR  TROPONIN I  MAGNESIUM  GLUCOSE, CAPILLARY  TSH  TROPONIN I  TROPONIN I  HEMOGLOBIN A1C  COMPREHENSIVE METABOLIC PANEL  CBC  TYPE AND SCREEN   Imaging Review Dg Chest 2 View  11/15/2012   CLINICAL DATA:  Shortness of breath, dizziness, chest pain.  EXAM: CHEST  2 VIEW  COMPARISON:  03/17/2012  FINDINGS: Prior median sternotomy and CABG. Mild hyperinflation  compatible with COPD. Patchy right lower lung airspace opacity with small right pleural effusion. Cannot exclude pneumonia. Scarring in the left base, stable. No acute bony abnormality.  IMPRESSION: Patchy right lower lung airspace opacity and small right effusion. Cannot exclude pneumonia.  Cardiomegaly, COPD.   Electronically Signed   By: Rolm Baptise   On: 11/15/2012 14:39    MDM   1. CAP (community acquired pneumonia)   2. CHF (congestive heart failure)   3. Hypokalemia   4. Acute respiratory failure with hypoxia   5. Atypical chest pain   6. COPD with acute exacerbation       Date: 11/15/2012  Rate: 69  Rhythm: normal sinus rhythm  QRS Axis: normal  Intervals: normal  ST/T Wave abnormalities: nonspecific ST/T changes  Conduction Disutrbances:none  Narrative Interpretation:   Old EKG Reviewed: previous T wave inversion in lateral leads improved  Anemia at baseline, CXR concerning for PNA vs asymmetric CHF. Pt also may have aspirated during procedure but for now will treat as CAP, also given lasix for possible fluid overload. Admit for treatment.     Charles B. Karle Starch, MD 11/15/12 Einar Crow

## 2012-11-15 NOTE — H&P (Signed)
[facility] History and Physical  Donald Gaines [id number] DOB: [birth year] DOA: 11/15/2012  Referring physician: ED physician, Donald Gaines PCP: Donald Cahill, MD  Specialists: Donald Gaines cardiology  Chief Complaint: Shortness of breath and chest pain.  HPI: Donald Gaines is a 72 y.o. male with a history significant for coronary artery disease, TIA, cecal AVM, COPD, and chronic anemia, who presents to the emergency department today with a chief complaint of shortness of breath. The patient reports shortness of breath for several months, but it has gotten progressively worse over the last couple days. He has had shortness of breath at rest and with activity. He has had shortness of breath when he lays flat. He has had a productive cough with brown sputum. He has had subjective fever and chills. He has had nausea but no vomiting. He has had right-sided chest pain that radiates to the left. It is not pleuritic. The pain is similar to a tightness. He has had no unusual swelling in his legs, but admits that he has chronic mild swelling in his left leg from vein harvesting for his previous CABG. He continues to smoke one pack of cigarettes per day.  In the emergency department, he is afebrile and hemodynamically stable. He was oxygenating 88% on room air, but his oxygen saturation improved to 93 on 2 L of oxygen. His EKG revealed nonspecific T-wave abnormalities in the inferior and lateral leads, prolonged QT, PVCs, and a heart rate of 69 beats per minute. His chest x-ray reveals cardiomegaly, COPD, and a patchy right lower lobe airspace opacity. His lab data are significant for potassium of 3.0, glucose 138, troponin I of less than 0.30, proBNP is 2764, and hemoglobin of 8.6. He is being admitted for further evaluation and management.    Review of Systems: As above in history present illness. Reviewed. Otherwise review of systems is negative.  Past Medical History  Diagnosis Date  . TIA  (transient ischemic attack)     2005  . GERD (gastroesophageal reflux disease)   . Hyperlipidemia   . Hypertension   . Depression   . Diabetes mellitus, type 2   . Restrictive lung disease   . Candida esophagitis   . Hemorrhoids   . Arteriovenous malformation small bowel     Capsule study 3/10  . GI bleeding     Cecal ulcers, arteriovenous malformations  . Chronic diarrhea SEP 2011    ?ETIOLOGY-DIABETIC ENTEROPATHY, LACTOSE INTOLERANCE,  OR IBS-MIXED  . Bloating AUG 2009    NL HBT FOR SIBO  . Anemia FEB 2008 FEDA 2o to AVMs, & large colon ulcers    HB 10.7 MCV 74.3; Dr Donald Gaines  . BPH (benign prostatic hypertrophy)     Dr Donald Gaines  . Glaucoma   . Cataract   . ASCVD (arteriosclerotic cardiovascular disease)     LIMA to LAD and LIMA to intermediate Gaines, SVG to PD and second OM. 06/03/93  . Bilateral carotid artery stenosis     50-75% external left carotid stenosis  . Coronary atherosclerosis of native coronary artery     Multivessel, BMS SVG TO RCA 2000, reportedly  "2" additional stents in interim  . Myocardial infarction     1994  . Stroke     mini- strokes in past.States has weakness and numbness of right leg. Short term memory loss  . COPD (chronic obstructive pulmonary disease)   . Sleep apnea   . Portal hypertensive gastropathy 11/08/2012    EGD. Donald Gaines  .  AVM (arteriovenous malformation) of colon 10/2012    Cecum & cecal polyps  . External hemorrhoids 10/2012   Past Surgical History  Procedure Laterality Date  . Esophagogastroduodenoscopy  SEP 09    DUODENAL LIPOMA  . Capsule endo  03/10  . Right inguinal hernia repair    . Hydrogen breath test  2009  . Colonoscopy  APR 2008    SIMPLE ADENOMA, [place] TICS, IH  . Colonoscopy  FEB 2008 ANEMIA. MELENA     2 LARGE ILEOCEAL ULCERS 2o to ASA, [place] TICS, IH  . Colonoscopy  SEP 2009 TRANSFUSION DEP ANEMIA    AC AVM-ABLATED, Donald Gaines TICS, IH  . Upper gastrointestinal endoscopy  SEP 2009    GASTRIC AVM ABLATED, NL DUO Bx   . Cardiac catherization  04/2012  . Cardiac catheterization  04/05/1996 [facility]    EF 45%, occluded SVG to circumflex, patent SVG to D1 and PDA and patent LIMA to LAD with severe native vessel disease.  . Cardiac catheterization  08/22/1998 [facility]    Mild LM, severe LAD,severe CX, occlude RCA, patient  SVG to diatal RCA, patent vein graft to D1 and patent LIMA to LAD.   Marland Kitchen Coronary angioplasty with stent placement  08/22/1998 Donald Gaines    TEC stenting of the SVG to RCA-Last seen by cardiologist in 2010.  Marland Kitchen Coronary artery bypass graft  1995-triple bypass    LIMA-LAD, LIMA to intermediate Gaines, SVG to PD and second OM,  . Cataract extraction w/phaco Right 09/22/2012    Procedure: CATARACT EXTRACTION PHACO AND INTRAOCULAR LENS PLACEMENT (IOC);  Surgeon: Donald Branch, MD;  Location: [facility] ORS;  Service: Ophthalmology;  Laterality: Right;  CDE: 11.43  . Cataract extraction w/phaco Left 10/17/2012    Procedure: CATARACT EXTRACTION PHACO AND INTRAOCULAR LENS PLACEMENT (IOC);  Surgeon: Donald Branch, MD;  Location: [facility] ORS;  Service: Ophthalmology;  Laterality: Left;  CDE: 8.06  . Colonoscopy with esophagogastroduodenoscopy (egd) N/A 11/08/2012    Procedure: COLONOSCOPY WITH ESOPHAGOGASTRODUODENOSCOPY (EGD);  Surgeon: Donald Houston, MD;  Location: [facility] ENDO SUITE;  Service: Endoscopy;  Laterality: N/A;  250-rescheduled to 7:30 Ann to notify pt   Social History: He is married. He lives with his wife in [place]. He has one daughter. He is retired/disabled. He smokes one pack of cigarettes per day. He has a 60-pack-year history. He denies illicit drug use and alcohol use.    Allergies  Allergen Reactions  . Penicillins Rash    Family History  Problem Relation Age of Onset  . Heart attack Mother   . Heart disease Mother   . Hypertension Mother      Prior to Admission medications   Medication Sig Start Date End Date Taking? Authorizing Provider  ADVAIR DISKUS 250-50 MCG/DOSE AEPB Inhale 1 puff  into the lungs 2 (two) times daily.  10/02/10  Yes Historical Provider, MD  albuterol (PROVENTIL HFA;VENTOLIN HFA) 108 (90 BASE) MCG/ACT inhaler Inhale 2 puffs into the lungs every 6 (six) hours as needed for wheezing.   Yes Historical Provider, MD  ALPRAZolam Duanne Moron) 0.5 MG tablet Take 0.5 mg by mouth 2 (two) times daily as needed for anxiety.    Yes Historical Provider, MD  Bromfenac Sodium (PROLENSA) 0.07 % SOLN Place 1 drop into the left eye daily.    Yes Historical Provider, MD  Carboxymethylcellul-Glycerin (REFRESH OPTIVE OP) Place 1 drop into the right eye daily as needed (Dry Eyes).    Yes Historical Provider, MD  carvedilol (COREG) 6.25 MG tablet  Take 6.25 mg by mouth 2 (two) times daily with a meal.    Yes Historical Provider, MD  clopidogrel (PLAVIX) 75 MG tablet Take 75 mg by mouth daily.   Yes Historical Provider, MD  Difluprednate (DUREZOL) 0.05 % EMUL Place 1 drop into the left eye 3 (three) times daily.    Yes Historical Provider, MD  FLUoxetine (PROZAC) 20 MG capsule Take 20 mg by mouth 2 (two) times daily.   Yes Historical Provider, MD  furosemide (LASIX) 40 MG tablet Take 40 mg by mouth daily.    Yes Historical Provider, MD  hydrocodone-acetaminophen (LORCET-HD) 5-500 MG per capsule Take 1 capsule by mouth every 6 (six) hours as needed for pain.   Yes Historical Provider, MD  isosorbide mononitrate (IMDUR) 60 MG 24 hr tablet Take 60 mg by mouth daily.   Yes Historical Provider, MD  losartan (COZAAR) 50 MG tablet Take 50 mg by mouth daily.  09/23/10  Yes Historical Provider, MD  MYRBETRIQ 25 MG TB24 Take 25 mg by mouth daily.  01/26/12  Yes Historical Provider, MD  NITROSTAT 0.4 MG SL tablet Place 0.4 mg under the tongue every 5 (five) minutes as needed for chest pain.  05/23/10  Yes Historical Provider, MD  omeprazole (PRILOSEC) 20 MG capsule Take 20 mg by mouth 2 (two) times daily.    Yes Historical Provider, MD  rosuvastatin (CRESTOR) 20 MG tablet Take 20 mg by mouth daily.   Yes  Historical Provider, MD  sitaGLIPtan-metformin (JANUMET) 50-1000 MG per tablet Take 1 tablet by mouth 2 (two) times daily with a meal.     Yes Historical Provider, MD  Tamsulosin HCl (FLOMAX) 0.4 MG CAPS Take 0.4 mg by mouth daily. Takes in AM   Yes Historical Provider, MD  traMADol (ULTRAM) 50 MG tablet Take 50 mg by mouth every 8 (eight) hours as needed for pain.   Yes Historical Provider, MD   Physical Exam: Filed Vitals:   11/15/12 1519  BP: 157/66  Pulse:   Temp:   Resp: 22   Temperature 97.6. Pulse rate 59-63. Blood pressure 157/66. Oxygen saturation 97%.   General:  72 year old Caucasian man sitting up in bed, in no acute distress.  Eyes: Pupils equal, round, and reactive to light. Extraocular was are intact. Conjunctivae are clear. Sclerae are white.  ENT:  Oropharynx mildly dry mixed membranes. No teeth. No posterior exudates or erythema.reveals  Neck: supple, no adenopathy, no thyromegaly, no JVD.   Cardiovascular: S1, S2, with borderline bradycardia.   Respiratory: scattered rhonchus wheezes bilaterally. Breathing nonlabored at rest. Productive cough, not expectorated.   Abdomen: soft, positive bowel sounds, nontender, nondistended.  Skin: well-healed sternotomy scar. Well-healed left lower extremity scar. Good turgor. No rashes. Tattoo noted on chest.   Musculoskeletal/extremities : no acute hot red joints. Pedal pulses palpable. Trace of left lower extremity edema. No right lower extremity edema.   Psychiatric: flat affect. Alert and oriented x3.   Neurologic: cranial nerves II through XII are intact.   Labs on Admission:  Basic Metabolic Panel:  Recent Labs Lab 11/15/12 1308  NA 140  K 3.0*  CL 103  CO2 26  GLUCOSE 138*  BUN 13  CREATININE 0.86  CALCIUM 9.3   Liver Function Tests: No results found for this basename: AST, ALT, ALKPHOS, BILITOT, PROT, ALBUMIN,  in the last 168 hours No results found for this basename: LIPASE, AMYLASE,  in the last  168 hours No results found for this basename: AMMONIA,  in the  last 168 hours CBC:  Recent Labs Lab 11/15/12 1308  WBC 7.4  NEUTROABS 6.1  HGB 8.6*  HCT 29.2*  MCV 83.7  PLT 249   Cardiac Enzymes:  Recent Labs Lab 11/15/12 1308  TROPONINI <0.30    BNP (last 3 results)  Recent Labs  11/15/12 1308  PROBNP 2764.0*   CBG: No results found for this basename: GLUCAP,  in the last 168 hours  Radiological Exams on Admission: Dg Chest 2 View  11/15/2012   CLINICAL DATA:  Shortness of breath, dizziness, chest pain.  EXAM: CHEST  2 VIEW  COMPARISON:  03/17/2012  FINDINGS: Prior median sternotomy and CABG. Mild hyperinflation compatible with COPD. Patchy right lower lung airspace opacity with small right pleural effusion. Cannot exclude pneumonia. Scarring in the left base, stable. No acute bony abnormality.  IMPRESSION: Patchy right lower lung airspace opacity and small right effusion. Cannot exclude pneumonia.  Cardiomegaly, COPD.   Electronically Signed   By: Rolm Baptise   On: 11/15/2012 14:39    EKG: Independently reviewed, as above. Nonspecific lateral and inferior T wave abnormalities, PVCs, prolonged QT wave, normal sinus rhythm of 69 beats per minute.   Assessment/Plan Principal Problem:   Community acquired pneumonia Active Problems:   COPD with acute exacerbation   DIABETES MELLITUS   ANEMIA-IRON DEFICIENCY   TOBACCO ABUSE   CORONARY ATHEROSCLEROSIS NATIVE CORONARY ARTERY   Essential hypertension, benign   Acute respiratory failure with hypoxia   Hypokalemia   Chronic GI bleeding   Atypical chest pain   1. Community-acquired pneumonia with superimposed COPD with exacerbation. 2. COPD with bronchitic exacerbation. The patient continues to smoke. 3. Acute respiratory failure with hypoxia. His oxygen saturation was 88% on room air in the ED. 4. Elevated pro BNP, but I do not believe the patient has decompensated heart failure. Nevertheless, he received 80 mg of  Lasix by the ED M.D. 5. Atypical chest pain in a patient with Coronary artery disease. The patient's EKG reveals new T-wave abnormalities and prolonged QT interval. His troponin I is negative so far. 6. Hypokalemia. Likely secondary to his chronic Lasix use. 7. Hypertension. His blood pressure is moderately elevated. His antihypertensive medications will be continued. 8. Type 2 diabetes mellitus. The patient is treated chronically with Janumet. His venous glucose today is reasonable. 9. Iron deficiency anemia. He is treated periodically with IV iron. He has a history of cecal polyps, cecal AVM, and external hemorrhoids. We'll continue his PPI.     Plan: 1. 80 mg of IV Lasix, nebulizers, Rocephin, and azithromycin were given in the ED. 2. We'll hold further IV Lasix. We'll resume oral Lasix in a couple of days. 3. Continue IV antibiotics with Rocephin and azithromycin. Continue albuterol and Atrovent nebulizers. We'll add prednisone. Prednisone should be tapered over the next few days. 4. Supportive treatment with oxygen, Tessalon Perles, and as needed Tussionex. 5. Supplement/replete potassium chloride. We'll check a magnesium level to rule out deficiency. 6. Nicotine patch. Order tobacco cessation counseling. 7. Hold Janumet. We'll treat his diabetes with sliding scale NovoLog. 8. For further evaluation, we'll order a 2-D echocardiogram, cardiac enzymes, magnesium level, TSH, and hemoglobin A1c.    Code Status: Full code  Family Communication: discussed with patient's wife  Disposition Plan: anticipate discharge to home when clinically improved.   Time spent: 1 hour.   [facility] Pager [phone]   If 7PM-7AM, please contact night-coverage [email or web address] Password [facility] 11/15/2012, 4:13 PM

## 2012-11-15 NOTE — ED Notes (Signed)
Pt's vein above the IV that zithromax was infusing in turned a reddish purple. Pt denies any pain, tenderness, burning, itching, or swelling.  Pt denies any sob, rash or throat swelling.  Stopped infusion immediately and d/c'd IV.  Dr. Karle Starch notified and was told to start IV in another vein and continue infusion of zithromax but to monitor for changes.

## 2012-11-15 NOTE — ED Notes (Signed)
IV zithromax infusing without difficulty.

## 2012-11-15 NOTE — ED Notes (Signed)
Breathing treatment complete 

## 2012-11-15 NOTE — ED Notes (Signed)
No redness, swelling, or discoloration of vein that zithromax is infusing in.  Pt waiting for respiratory to adminsiter breathing treatment.

## 2012-11-15 NOTE — Progress Notes (Signed)
Information regarding pneumonia vaccine given to patient. Pt is unsure if he received vaccine since turning 67. Will pass on to nursing that [place] office should be contacted to determine if pt needs dose of  Vaccination. Marry Guan

## 2012-11-15 NOTE — ED Notes (Signed)
Dr. Caryn Section evaluated pt.

## 2012-11-16 DIAGNOSIS — E119 Type 2 diabetes mellitus without complications: Secondary | ICD-10-CM

## 2012-11-16 DIAGNOSIS — K922 Gastrointestinal hemorrhage, unspecified: Secondary | ICD-10-CM

## 2012-11-16 DIAGNOSIS — I059 Rheumatic mitral valve disease, unspecified: Secondary | ICD-10-CM

## 2012-11-16 LAB — URINALYSIS, ROUTINE W REFLEX MICROSCOPIC
Leukocytes, UA: NEGATIVE
Nitrite: NEGATIVE
Protein, ur: NEGATIVE mg/dL
Specific Gravity, Urine: 1.025 (ref 1.005–1.030)
Urobilinogen, UA: 0.2 mg/dL (ref 0.0–1.0)

## 2012-11-16 LAB — GLUCOSE, CAPILLARY
Glucose-Capillary: 432 mg/dL — ABNORMAL HIGH (ref 70–99)
Glucose-Capillary: 277 mg/dL — ABNORMAL HIGH (ref 70–99)
Glucose-Capillary: 127 mg/dL — ABNORMAL HIGH (ref 70–99)

## 2012-11-16 LAB — CBC
HCT: 28.3 % — ABNORMAL LOW (ref 39.0–52.0)
Hemoglobin: 8.5 g/dL — ABNORMAL LOW (ref 13.0–17.0)
MCH: 25.2 pg — ABNORMAL LOW (ref 26.0–34.0)
MCHC: 30 g/dL (ref 30.0–36.0)
MCV: 84 fL (ref 78.0–100.0)
Platelets: 249 10*3/uL (ref 150–400)
RBC: 3.37 MIL/uL — ABNORMAL LOW (ref 4.22–5.81)
RDW: 20.5 % — ABNORMAL HIGH (ref 11.5–15.5)
WBC: 6.3 10*3/uL (ref 4.0–10.5)

## 2012-11-16 LAB — COMPREHENSIVE METABOLIC PANEL
ALT: 7 U/L (ref 0–53)
AST: 14 U/L (ref 0–37)
Albumin: 3.3 g/dL — ABNORMAL LOW (ref 3.5–5.2)
Chloride: 103 mEq/L (ref 96–112)
Creatinine, Ser: 0.84 mg/dL (ref 0.50–1.35)
Potassium: 4 mEq/L (ref 3.5–5.1)
Sodium: 140 mEq/L (ref 135–145)
Total Bilirubin: 0.7 mg/dL (ref 0.3–1.2)

## 2012-11-16 LAB — TROPONIN I: Troponin I: <0.3 ng/mL (ref <0.30)

## 2012-11-16 MED ORDER — IPRATROPIUM BROMIDE 0.02 % IN SOLN
0.5000 mg | Freq: Three times a day (TID) | RESPIRATORY_TRACT | Status: DC
  Administered 2012-11-16 – 2012-11-18 (×6): 0.5 mg via RESPIRATORY_TRACT
  Filled 2012-11-16 (×6): qty 2.5

## 2012-11-16 MED ORDER — INSULIN ASPART 100 UNIT/ML ~~LOC~~ SOLN
0.0000 [IU] | Freq: Three times a day (TID) | SUBCUTANEOUS | Status: DC
  Administered 2012-11-16: 11 [IU] via SUBCUTANEOUS
  Administered 2012-11-16: 20 [IU] via SUBCUTANEOUS
  Administered 2012-11-17 (×2): 7 [IU] via SUBCUTANEOUS

## 2012-11-16 MED ORDER — AZITHROMYCIN 250 MG PO TABS
500.0000 mg | ORAL_TABLET | Freq: Every day | ORAL | Status: DC
  Administered 2012-11-16: 500 mg via ORAL
  Filled 2012-11-16: qty 2

## 2012-11-16 MED ORDER — INSULIN ASPART 100 UNIT/ML ~~LOC~~ SOLN: 0.0000 [IU] | Freq: Every day | SUBCUTANEOUS | Status: DC

## 2012-11-16 MED ORDER — ALBUTEROL SULFATE (5 MG/ML) 0.5% IN NEBU
2.5000 mg | INHALATION_SOLUTION | Freq: Three times a day (TID) | RESPIRATORY_TRACT | Status: DC
  Administered 2012-11-16 – 2012-11-18 (×6): 2.5 mg via RESPIRATORY_TRACT
  Filled 2012-11-16 (×6): qty 0.5

## 2012-11-16 NOTE — Progress Notes (Signed)
Pt ambulated in halls without O2 sat remained 95% during ambulation  Pt returned to room and sitting in chair

## 2012-11-16 NOTE — Progress Notes (Signed)
UR Chart Review Completed  

## 2012-11-16 NOTE — Progress Notes (Signed)
PHARMACIST - PHYSICIAN COMMUNICATION DR:   Gardenia Phlegm CONCERNING: Antibiotic IV to Oral Route Change Policy  RECOMMENDATION: This patient is receiving Zithromax by the intravenous route.  Based on criteria approved by the Pharmacy and Therapeutics Committee, the antibiotic(s) is/are being converted to the equivalent oral dose form(s).   DESCRIPTION: These criteria include:  Patient being treated for a respiratory tract infection, urinary tract infection, cellulitis or clostridium difficile associated diarrhea if on metronidazole  The patient is not neutropenic and does not exhibit a GI malabsorption state  The patient is eating (either orally or via tube) and/or has been taking other orally administered medications for a least 24 hours  The patient is improving clinically and has a Tmax < 100.5  If you have questions about this conversion, please contact the Pharmacy Department  [x]   [phone] )  [facility] []   [phone] )  [facility]  []   [phone] )  [facility] []   [phone] )  [place], PharmD, BCPS 11/16/2012@11 :39 AM

## 2012-11-16 NOTE — Progress Notes (Signed)
[facility] PROGRESS NOTE  Donald Gaines [id number] DOB: 10-07-1940 DOA: 11/15/2012 PCP: Delphina Cahill, MD  HPI: Donald Gaines is a 72 y.o. male with a history significant for coronary artery disease, TIA, cecal AVM, COPD, and chronic anemia, who presented to the emergency department with a chief complaint of shortness of breath.   Assessment/Plan:   CAP - continue Ceftriaxone/Azithromycin. Switch to Levofloxacin in am COPD exacerbation - antibiotics, steroids. Taper Prednisone.  Acute on chronic hypoxic respiratory failure - O2 as needed, needs O2 on discharge likely.  Tobacco abuse - nicotine patch Anemia - stable, has possible occult GI bleed. Scheduled for a capsule study tomorrow, I talked to GI and will postpone until Monday. HLD HTN CAD - s/p CABG remotely, 2D echo pending.  Atypical chest pain - troponin negative x 3. 2D echo pending.  DM type 2 - on oral medications at home, HBA1C 6 DVT Prophylaxis - SCDs  Code Status: Full Family Communication: wife in the room  Disposition Plan: home when medically ready pending respiratory improvement.   Consultants:  none  Procedures:  2D echo pending  Anti-infectives   Start     Dose/Rate Route Frequency Ordered Stop   11/16/12 1500  cefTRIAXone (ROCEPHIN) 1 g in dextrose 5 % 50 mL IVPB     1 g 100 mL/hr over 30 Minutes Intravenous Every 24 hours 11/15/12 1612     11/16/12 1500  azithromycin (ZITHROMAX) 500 mg in dextrose 5 % 250 mL IVPB  Status:  Discontinued     500 mg 250 mL/hr over 60 Minutes Intravenous Every 24 hours 11/15/12 1612 11/16/12 1140   11/16/12 1145  azithromycin (ZITHROMAX) tablet 500 mg     500 mg Oral Daily 11/16/12 1140     11/15/12 1500  cefTRIAXone (ROCEPHIN) 1 g in dextrose 5 % 50 mL IVPB     1 g 100 mL/hr over 30 Minutes Intravenous  Once 11/15/12 1448 11/15/12 1534   11/15/12 1500  azithromycin (ZITHROMAX) 500 mg in dextrose 5 % 250 mL IVPB     500 mg 250 mL/hr over 60 Minutes  Intravenous  Once 11/15/12 1448 11/15/12 1651     Antibiotics Given (last 72 hours)   Date/Time Action Medication Dose   11/16/12 1244 Given   azithromycin (ZITHROMAX) tablet 500 mg 500 mg      HPI/Subjective: Appreciates improvement in his breathing.   Objective: Filed Vitals:   11/16/12 0509 11/16/12 0653 11/16/12 0755 11/16/12 1255  BP: 136/64   137/68  Pulse: 72  69 71  Temp: 98 F (36.7 C)   97.4 F (36.3 C)  TempSrc: Oral   Oral  Resp: 16  17 18   Height:      Weight: 78.79 kg (173 lb 11.2 oz)     SpO2: 97% 94% 94% 95%    Intake/Output Summary (Last 24 hours) at 11/16/12 1337 Last data filed at 11/16/12 1254  Gross per 24 hour  Intake   1500 ml  Output   1950 ml  Net   -450 ml   Filed Weights   11/15/12 1753 11/16/12 0509  Weight: 79.47 kg (175 lb 3.2 oz) 78.79 kg (173 lb 11.2 oz)    Exam:   General:  NAD  Cardiovascular: regular rate and rhythm, without MRG  Respiratory: good air movement, distant breath sounds, wheezing present,   Abdomen: soft, not tender to palpation, positive bowel sounds  MSK: no peripheral edema  Neuro: non focal  Data Reviewed: Basic Metabolic  Panel:  Recent Labs Lab 11/15/12 1308 11/15/12 1652 11/16/12 0411  NA 140  --  140  K 3.0*  --  4.0  CL 103  --  103  CO2 26  --  27  GLUCOSE 138*  --  150*  BUN 13  --  12  CREATININE 0.86  --  0.84  CALCIUM 9.3  --  8.9  MG  --  1.8  --    Liver Function Tests:  Recent Labs Lab 11/16/12 0411  AST 14  ALT 7  ALKPHOS 107  BILITOT 0.7  PROT 7.2  ALBUMIN 3.3*   CBC:  Recent Labs Lab 11/15/12 1308 11/16/12 0411  WBC 7.4 6.3  NEUTROABS 6.1  --   HGB 8.6* 8.5*  HCT 29.2* 28.3*  MCV 83.7 84.0  PLT 249 249   Cardiac Enzymes:  Recent Labs Lab 11/15/12 1308 11/15/12 1651 11/15/12 2211 11/16/12 0411  TROPONINI <0.30 <0.30 <0.30 <0.30   BNP (last 3 results)  Recent Labs  11/15/12 1308  PROBNP 2764.0*   CBG:  Recent Labs Lab 11/15/12 1829  11/15/12 2018 11/16/12 0705 11/16/12 1112  GLUCAP 99 141* 134* 432*   Studies: Dg Chest 2 View  11/15/2012   CLINICAL DATA:  Shortness of breath, dizziness, chest pain.  EXAM: CHEST  2 VIEW  COMPARISON:  03/17/2012  FINDINGS: Prior median sternotomy and CABG. Mild hyperinflation compatible with COPD. Patchy right lower lung airspace opacity with small right pleural effusion. Cannot exclude pneumonia. Scarring in the left base, stable. No acute bony abnormality.  IMPRESSION: Patchy right lower lung airspace opacity and small right effusion. Cannot exclude pneumonia.  Cardiomegaly, COPD.   Electronically Signed   By: Rolm Baptise   On: 11/15/2012 14:39    Scheduled Meds: . albuterol  2.5 mg Nebulization TID  . atorvastatin  40 mg Oral q1800  . azithromycin  500 mg Oral Daily  . benzonatate  100 mg Oral TID  . carvedilol  6.25 mg Oral BID WC  . cefTRIAXone (ROCEPHIN)  IV  1 g Intravenous Q24H  . clopidogrel  75 mg Oral Daily  . docusate sodium  100 mg Oral BID  . FLUoxetine  20 mg Oral BID  . [START ON 11/17/2012] furosemide  40 mg Oral Daily  . insulin aspart  0-20 Units Subcutaneous TID WC  . insulin aspart  0-5 Units Subcutaneous QHS  . ipratropium  0.5 mg Nebulization TID  . isosorbide mononitrate  60 mg Oral Daily  . losartan  50 mg Oral Daily  . mirabegron ER  25 mg Oral Daily  . mometasone-formoterol  2 puff Inhalation BID  . nicotine  21 mg Transdermal Daily  . pantoprazole  40 mg Oral Daily  . potassium chloride  40 mEq Oral BID  . predniSONE  60 mg Oral Q breakfast  . sodium chloride  3 mL Intravenous Q12H  . tamsulosin  0.4 mg Oral Daily   Continuous Infusions:   Principal Problem:   Community acquired pneumonia Active Problems:   DIABETES MELLITUS   ANEMIA-IRON DEFICIENCY   TOBACCO ABUSE   CORONARY ATHEROSCLEROSIS NATIVE CORONARY ARTERY   Essential hypertension, benign   Acute respiratory failure with hypoxia   Hypokalemia   Chronic GI bleeding   COPD with  acute exacerbation   Atypical chest pain   Time spent: [place], MD [facility] Pager [phone]. If 7 PM - 7 AM, please contact night-coverage at [email or web address], password [facility] 11/16/2012, 1:37 PM  LOS: 1 day

## 2012-11-16 NOTE — Progress Notes (Signed)
*  PRELIMINARY RESULTS* Echocardiogram 2D Echocardiogram has been performed.  [name] 11/16/2012, 10:26 AM

## 2012-11-17 ENCOUNTER — Encounter (HOSPITAL_COMMUNITY): Payer: Self-pay | Admitting: Oncology

## 2012-11-17 ENCOUNTER — Ambulatory Visit: Payer: Medicare Other | Admitting: Gastroenterology

## 2012-11-17 LAB — GLUCOSE, CAPILLARY
Glucose-Capillary: 117 mg/dL — ABNORMAL HIGH (ref 70–99)
Glucose-Capillary: 183 mg/dL — ABNORMAL HIGH (ref 70–99)

## 2012-11-17 LAB — BASIC METABOLIC PANEL
Chloride: 105 mEq/L (ref 96–112)
GFR calc Af Amer: >90 mL/min (ref >90)
GFR calc non Af Amer: 83 mL/min — ABNORMAL LOW (ref >90)
Potassium: 3.9 mEq/L (ref 3.5–5.1)

## 2012-11-17 LAB — CBC
MCHC: 29.6 g/dL — ABNORMAL LOW (ref 30.0–36.0)
Platelets: 278 10*3/uL (ref 150–400)
RDW: 22 % — ABNORMAL HIGH (ref 11.5–15.5)
WBC: 15.4 10*3/uL — ABNORMAL HIGH (ref 4.0–10.5)

## 2012-11-17 MED ORDER — LEVOFLOXACIN 500 MG PO TABS
500.0000 mg | ORAL_TABLET | Freq: Every day | ORAL | Status: DC
  Administered 2012-11-17 – 2012-11-18 (×2): 500 mg via ORAL
  Filled 2012-11-17 (×2): qty 1

## 2012-11-17 MED ORDER — PREDNISONE 20 MG PO TABS
40.0000 mg | ORAL_TABLET | Freq: Every day | ORAL | Status: DC
  Administered 2012-11-18: 40 mg via ORAL
  Filled 2012-11-17: qty 2

## 2012-11-17 NOTE — Progress Notes (Signed)
[facility] PROGRESS NOTE  USHER HOFACKER [id number] DOB: [birth year] DOA: 11/15/2012 PCP: Delphina Cahill, MD  HPI: Donald Gaines is a 72 y.o. male with a history significant for coronary artery disease, TIA, cecal AVM, COPD, and chronic anemia, who presented to the emergency department with a chief complaint of shortness of breath.   Assessment/Plan:  CAP - continue Ceftriaxone/Azithromycin. Switch to Levofloxacin today COPD exacerbation - antibiotics, steroids. Taper Prednisone. - minimal wheezing today, plan for d/c tomorrow if continues to improve.  Acute on chronic hypoxic respiratory failure - O2 as needed, able to ambulate without O2 Tobacco abuse - nicotine patch Anemia - stable, has possible occult GI bleed. Scheduled for a capsule study 8/28, I talked to GI and will postpone until next week. HLD HTN CAD - s/p CABG remotely, 2D echo as below Atypical chest pain - troponin negative x 3. 2D echo pending.  DM type 2 - on oral medications at home, HBA1C 6 DVT Prophylaxis - SCDs  Code Status: Full Family Communication: wife in the room  Disposition Plan: home when medically ready pending respiratory improvement.   Consultants:  none  Procedures:  2D echo Study Conclusions - Left ventricle: Poor image quality unable to assess all RWMA;s Overall EF likely in low normal range. ? Apical hypokinesis The cavity size was normal. Wall thickness was increased in a pattern of severe LVH. - Mitral valve: Mild regurgitation. - Left atrium: The atrium was moderately dilated. - Pericardium, extracardiac: A trivial pericardial effusion was identified posterior to the heart.  Anti-infectives   Start     Dose/Rate Route Frequency Ordered Stop   11/17/12 1000  levofloxacin (LEVAQUIN) tablet 500 mg     500 mg Oral Daily 11/17/12 0759     11/16/12 1500  cefTRIAXone (ROCEPHIN) 1 g in dextrose 5 % 50 mL IVPB  Status:  Discontinued     1 g 100 mL/hr over 30 Minutes Intravenous Every  24 hours 11/15/12 1612 11/17/12 0759   11/16/12 1500  azithromycin (ZITHROMAX) 500 mg in dextrose 5 % 250 mL IVPB  Status:  Discontinued     500 mg 250 mL/hr over 60 Minutes Intravenous Every 24 hours 11/15/12 1612 11/16/12 1140   11/16/12 1145  azithromycin (ZITHROMAX) tablet 500 mg  Status:  Discontinued     500 mg Oral Daily 11/16/12 1140 11/17/12 0759   11/15/12 1500  cefTRIAXone (ROCEPHIN) 1 g in dextrose 5 % 50 mL IVPB     1 g 100 mL/hr over 30 Minutes Intravenous  Once 11/15/12 1448 11/15/12 1534   11/15/12 1500  azithromycin (ZITHROMAX) 500 mg in dextrose 5 % 250 mL IVPB     500 mg 250 mL/hr over 60 Minutes Intravenous  Once 11/15/12 1448 11/15/12 1651     Antibiotics Given (last 72 hours)   Date/Time Action Medication Dose Rate   11/16/12 1244 Given   azithromycin (ZITHROMAX) tablet 500 mg 500 mg    11/16/12 1514 Given   cefTRIAXone (ROCEPHIN) 1 g in dextrose 5 % 50 mL IVPB 1 g 100 mL/hr   11/17/12 1022 Given   levofloxacin (LEVAQUIN) tablet 500 mg 500 mg       HPI/Subjective: - feeling well  Objective: Filed Vitals:   11/16/12 2100 11/17/12 0500 11/17/12 0749 11/17/12 0838  BP: 155/70 149/86    Pulse: 69 67    Temp: 97.5 F (36.4 C) 98 F (36.7 C)    TempSrc: Oral Oral    Resp: 20 17  Height:      Weight:      SpO2: 93% 98% 99% 100%    Intake/Output Summary (Last 24 hours) at 11/17/12 1330 Last data filed at 11/17/12 1306  Gross per 24 hour  Intake    480 ml  Output    775 ml  Net   -295 ml   Filed Weights   11/15/12 1753 11/16/12 0509  Weight: 79.47 kg (175 lb 3.2 oz) 78.79 kg (173 lb 11.2 oz)    Exam:   General:  NAD  Cardiovascular: regular rate and rhythm, without MRG  Respiratory: good air movement, distant breath sounds, wheezing present,   Abdomen: soft, not tender to palpation, positive bowel sounds  MSK: no peripheral edema  Neuro: non focal  Data Reviewed: Basic Metabolic Panel:  Recent Labs Lab 11/15/12 1308  11/15/12 1652 11/16/12 0411 11/17/12 0514  NA 140  --  140 140  K 3.0*  --  4.0 3.9  CL 103  --  103 105  CO2 26  --  27 26  GLUCOSE 138*  --  150* 138*  BUN 13  --  12 16  CREATININE 0.86  --  0.84 0.94  CALCIUM 9.3  --  8.9 9.2  MG  --  1.8  --   --    Liver Function Tests:  Recent Labs Lab 11/16/12 0411  AST 14  ALT 7  ALKPHOS 107  BILITOT 0.7  PROT 7.2  ALBUMIN 3.3*   CBC:  Recent Labs Lab 11/15/12 1308 11/16/12 0411 11/17/12 0514  WBC 7.4 6.3 15.4*  NEUTROABS 6.1  --   --   HGB 8.6* 8.5* 8.4*  HCT 29.2* 28.3* 28.4*  MCV 83.7 84.0 85.8  PLT 249 249 278   Cardiac Enzymes:  Recent Labs Lab 11/15/12 1308 11/15/12 1651 11/15/12 2211 11/16/12 0411  TROPONINI <0.30 <0.30 <0.30 <0.30   BNP (last 3 results)  Recent Labs  11/15/12 1308  PROBNP 2764.0*   CBG:  Recent Labs Lab 11/16/12 1112 11/16/12 1615 11/16/12 2053 11/17/12 0726 11/17/12 1106  GLUCAP 432* 277* 127* 117* 214*   Studies: Dg Chest 2 View  11/15/2012   CLINICAL DATA:  Shortness of breath, dizziness, chest pain.  EXAM: CHEST  2 VIEW  COMPARISON:  03/17/2012  FINDINGS: Prior median sternotomy and CABG. Mild hyperinflation compatible with COPD. Patchy right lower lung airspace opacity with small right pleural effusion. Cannot exclude pneumonia. Scarring in the left base, stable. No acute bony abnormality.  IMPRESSION: Patchy right lower lung airspace opacity and small right effusion. Cannot exclude pneumonia.  Cardiomegaly, COPD.   Electronically Signed   By: Rolm Baptise   On: 11/15/2012 14:39    Scheduled Meds: . albuterol  2.5 mg Nebulization TID  . atorvastatin  40 mg Oral q1800  . benzonatate  100 mg Oral TID  . carvedilol  6.25 mg Oral BID WC  . clopidogrel  75 mg Oral Daily  . docusate sodium  100 mg Oral BID  . FLUoxetine  20 mg Oral BID  . furosemide  40 mg Oral Daily  . insulin aspart  0-20 Units Subcutaneous TID WC  . insulin aspart  0-5 Units Subcutaneous QHS  .  ipratropium  0.5 mg Nebulization TID  . isosorbide mononitrate  60 mg Oral Daily  . levofloxacin  500 mg Oral Daily  . losartan  50 mg Oral Daily  . mirabegron ER  25 mg Oral Daily  . mometasone-formoterol  2 puff Inhalation  BID  . nicotine  21 mg Transdermal Daily  . pantoprazole  40 mg Oral Daily  . predniSONE  60 mg Oral Q breakfast  . sodium chloride  3 mL Intravenous Q12H  . tamsulosin  0.4 mg Oral Daily   Continuous Infusions:   Principal Problem:   Community acquired pneumonia Active Problems:   DIABETES MELLITUS   ANEMIA-IRON DEFICIENCY   TOBACCO ABUSE   CORONARY ATHEROSCLEROSIS NATIVE CORONARY ARTERY   Essential hypertension, benign   Acute respiratory failure with hypoxia   Hypokalemia   Chronic GI bleeding   COPD with acute exacerbation   Atypical chest pain  Time spent: [place], MD [facility] Pager [phone]. If 7 PM - 7 AM, please contact night-coverage at [email or web address], password [facility] 11/17/2012, 1:30 PM  LOS: 2 days

## 2012-11-17 NOTE — Evaluation (Signed)
Physical Therapy Evaluation Patient Details Name: Donald Gaines MRN: [id number] DOB: 07/02/1940 Today's Date: 11/17/2012 Time: [id number] PT Time Calculation (min): 30 min  PT Assessment / Plan / Recommendation History of Present Illness  Donald Gaines is a 72 y.o. male with a history significant for coronary artery disease, TIA, cecal AVM, COPD, and chronic anemia.  He was admitted to [facility] for HCAP. He has a CPAP that he reports he is non-complaint with.  He states that over the past two years he feels he has become more tired and has had increaed SOB over the past few months.   Clinical Impression  Donald Gaines completed approximalty 1000 feet in 6 minutes with 3 standing therapeutic rest breaks with O2sats remaining at 93-95%.  Used teach back for appropriate diaphragmatic breathing technique and time with patient and his wife.  Pt reports non-compliance with CPAP due to increased noise and discomfort.  Encouraged to use CPAP to improve his SOB during the day and also discussed to talk with his MD about use of a [place] and O2 at night.   Discussed with nursing and would recommend incentive spirometer, he has increased restrictions and difficulty with exhalaltion and performs a 1:1 ration of inhale:exhale,  and asked him to discuss with his MD about a nasal canula w/ O2 at night time to improve his overall QOL.  Discussed with social work and they state he is limited in this option due to insurance limitations.    PT Assessment  Patent does not need any further PT services    Follow Up Recommendations  No PT follow up    Does the patient have the potential to tolerate intense rehabilitation      Barriers to Discharge        Equipment Recommendations       Recommendations for Other Services     Frequency      Precautions / Restrictions     Pertinent Vitals/Pain O2 Sats Room Air during mobility: 93-95%      Mobility  Bed Mobility Bed Mobility: Supine to Sit Supine to Sit: 6:  Modified independent (Device/Increase time);With rails Transfers Transfers: Sit to Stand;Stand to Sit Sit to Stand: 6: Modified independent (Device/Increase time) Stand to Sit: 6: Modified independent (Device/Increase time) Ambulation/Gait Ambulation/Gait Assistance: 5: Supervision Ambulation Distance (Feet): 1000 Feet Assistive device: None Ambulation/Gait Assistance Details: 6 minute walk test: O2 sats 93-95% Room Air 3 therapuetic standing rest breaks    Exercises Diaphragmatic breathing: 2 sec inhale, 4 sec exhale x10     PT Diagnosis:    PT Problem List:   PT Treatment Interventions:       PT Goals(Current goals can be found in the care plan section)    Visit Information  Last PT Received On: 11/17/12 History of Present Illness: Donald Gaines is a 72 y.o. male with a history significant for coronary artery disease, TIA, cecal AVM, COPD, and chronic anemia.  He was admitted to [facility] for HCAP. He has a CPAP that he reports he is non-complaint with.  He states that over the past two years he feels he has become more tired and has had increaed SOB over the past few months.        Prior [place] expects to be discharged to:: Private residence Living Arrangements: Spouse/significant other Type of Home: House Home Access: Stairs to enter [employer] of Steps: 1 Home Layout: One level Home Equipment: Environmental consultant - 2 wheels Prior Function  Level of Independence: Independent Comments: Wife is a caretaker for her aling father in [place].  She reports that she travels a lot back and forth to help him.  Communication Communication: No difficulties    Cognition       Extremity/Trunk Assessment Lower Extremity Assessment Lower Extremity Assessment: Overall WFL for tasks assessed   Balance High Level Balance High Level Balance Activites: Side stepping;Direction changes;Turns High Level Balance Comments: supervision  End of Session PT - End  of Session Equipment Utilized During Treatment: Gait belt Activity Tolerance: Patient tolerated treatment well Patient left: in chair;with family/visitor present;with nursing/sitter in room  GP     Donald Gaines, MPT, ATC 11/17/2012, 11:11 AM

## 2012-11-18 DIAGNOSIS — F17200 Nicotine dependence, unspecified, uncomplicated: Secondary | ICD-10-CM

## 2012-11-18 LAB — GLUCOSE, CAPILLARY: Glucose-Capillary: 115 mg/dL — ABNORMAL HIGH (ref 70–99)

## 2012-11-18 MED ORDER — DOCUSATE SODIUM 100 MG PO CAPS: 100.0000 mg | ORAL_CAPSULE | Freq: Every day | ORAL | Status: DC | PRN

## 2012-11-18 MED ORDER — LEVOFLOXACIN 500 MG PO TABS: 500.0000 mg | ORAL_TABLET | Freq: Every day | ORAL | Status: DC

## 2012-11-18 MED ORDER — BISACODYL 5 MG PO TBEC
10.0000 mg | DELAYED_RELEASE_TABLET | Freq: Once | ORAL | Status: DC
  Administered 2012-11-18: 10 mg via ORAL
  Filled 2012-11-18: qty 2

## 2012-11-18 MED ORDER — BENZONATATE 100 MG PO CAPS: 100.0000 mg | ORAL_CAPSULE | Freq: Three times a day (TID) | ORAL | Status: DC

## 2012-11-18 MED ORDER — POLYETHYLENE GLYCOL 3350 17 G PO PACK: 17.0000 g | PACK | Freq: Every day | ORAL | Status: DC

## 2012-11-18 MED ORDER — PREDNISONE 20 MG PO TABS: 40.0000 mg | ORAL_TABLET | Freq: Every day | ORAL | Status: DC

## 2012-11-18 NOTE — Progress Notes (Addendum)
Pt was given instructions, prescriptions and carenotes he verbalized understanding he left the floor via w/c with staff in stable condition.

## 2012-11-18 NOTE — Discharge Summary (Signed)
Physician Discharge Summary  Donald Gaines [id number] DOB: 12-28-1940 DOA: 11/15/2012  PCP: Delphina Cahill, MD  Admit date: 11/15/2012 Discharge date: 11/18/2012  Time spent: 45 minutes  Recommendations for Outpatient Follow-up:  1. Follow up with PCP next week   Recommendations for primary care physician for things to follow:  Respiratory status assessment   Discharge Diagnoses:  Principal Problem:   Community acquired pneumonia Active Problems:   DIABETES MELLITUS   ANEMIA-IRON DEFICIENCY   TOBACCO ABUSE   CORONARY ATHEROSCLEROSIS NATIVE CORONARY ARTERY   Essential hypertension, benign   Acute respiratory failure with hypoxia   Hypokalemia   Chronic GI bleeding   COPD with acute exacerbation   Atypical chest pain  Discharge Condition: stable  Diet recommendation: heart healthy  Filed Weights   11/15/12 1753 11/16/12 0509  Weight: 79.47 kg (175 lb 3.2 oz) 78.79 kg (173 lb 11.2 oz)   History of present illness:  Donald Gaines is a 72 y.o. male with a history significant for coronary artery disease, TIA, cecal AVM, COPD, and chronic anemia, who presents to the emergency department today with a chief complaint of shortness of breath. The patient reports shortness of breath for several months, but it has gotten progressively worse over the last couple days. He has had shortness of breath at rest and with activity. He has had shortness of breath when he lays flat. He has had a productive cough with brown sputum. He has had subjective fever and chills. He has had nausea but no vomiting. He has had right-sided chest pain that radiates to the left. It is not pleuritic. The pain is similar to a tightness. He has had no unusual swelling in his legs, but admits that he has chronic mild swelling in his left leg from vein harvesting for his previous CABG. He continues to smoke one pack of cigarettes per day.    Hospital Course:  CAP - chest x-ray showed patchy right lower lung  airspace opacity possibly representing pneumonia. On admission patient was started on ceftriaxone and azithromycin, which given clinical improvement was changed to oral levofloxacin on 11/17/2012. Patient remained afebrile, and we were able to wean off oxygen to room air. Patient was ambulating in the hallways on room air without any dyspnea or shortness of breath or chest pain. He is to continue levofloxacin on discharge and finish course at home. COPD exacerbation - patient with significant wheezing on admission, slowly improving on prednisone. By the day of discharge, his wheezing has resolved. He is to complete a prednisone taper for the next 7 days. Acute on chronic hypoxic respiratory failure - O2 as needed, able to ambulate without O2. I was initially suspected that he might need oxygen, however he was able to ambulate on room air and maintaining his oxygen saturations in the mid 90s. Tobacco abuse - patient was extensively counseled for cessation, and was provided with nicotine patch in the hospital. Anemia - stable, has possible occult GI bleed. Originally scheduled for a capsule study 8/28, I talked to GI and will postpone until next week.  CAD - s/p CABG remotely, 2D echo as below. Cardiac enzymes negative x3. He is to followup as scheduled with cardiology as an outpatient. DM type 2 - on oral medications at home, HBA1C 6   Procedures:  2D echo Study Conclusions - Left ventricle: Poor image quality unable to assess all RWMA;s Overall EF likely in low normal range. ? Apical hypokinesis The cavity size was normal. Wall thickness was  increased in a pattern of severe LVH. - Mitral valve: Mild regurgitation. - Left atrium: The atrium was moderately dilated. - Pericardium, extracardiac: A trivial pericardial effusion was identified posterior to the heart.  Consultations:  none  Discharge Exam: Filed Vitals:   11/17/12 2300 11/18/12 0630 11/18/12 0741 11/18/12 0935  BP: 163/79 157/80     Pulse: 61 59  68  Temp: 97.8 F (36.6 C) 97.8 F (36.6 C)    TempSrc: Oral Oral    Resp: 16 18    Height:      Weight:      SpO2: 93% 93% 96%    General: NAD Cardiovascular: RRR Respiratory: no wheezing, distant breath sounds.   Discharge Instructions   Future Appointments Provider Department Dept Phone   11/24/2012 9:00 AM [facility] IV/Med Infusion Room [facility] [phone]   11/28/2012 2:00 PM Lendon Colonel, NP [facility]       Medication List         ADVAIR DISKUS 250-50 MCG/DOSE Aepb  Generic drug:  Fluticasone-Salmeterol  Inhale 1 puff into the lungs 2 (two) times daily.     albuterol 108 (90 BASE) MCG/ACT inhaler  Commonly known as:  PROVENTIL HFA;VENTOLIN HFA  Inhale 2 puffs into the lungs every 6 (six) hours as needed for wheezing.     ALPRAZolam 0.5 MG tablet  Commonly known as:  XANAX  Take 0.5 mg by mouth 2 (two) times daily as needed for anxiety.     benzonatate 100 MG capsule  Commonly known as:  TESSALON  Take 1 capsule (100 mg total) by mouth 3 (three) times daily.     carvedilol 6.25 MG tablet  Commonly known as:  COREG  Take 6.25 mg by mouth 2 (two) times daily with a meal.     clopidogrel 75 MG tablet  Commonly known as:  PLAVIX  Take 75 mg by mouth daily.     FLOMAX 0.4 MG Caps capsule  Generic drug:  tamsulosin  Take 0.4 mg by mouth daily. Takes in AM     FLUoxetine 20 MG capsule  Commonly known as:  PROZAC  Take 20 mg by mouth 2 (two) times daily.     furosemide 40 MG tablet  Commonly known as:  LASIX  Take 40 mg by mouth daily.     hydrocodone-acetaminophen 5-500 MG per capsule  Commonly known as:  LORCET-HD  Take 1 capsule by mouth every 6 (six) hours as needed for pain.     isosorbide mononitrate 60 MG 24 hr tablet  Commonly known as:  IMDUR  Take 60 mg by mouth daily.     levofloxacin 500 MG tablet  Commonly known as:  LEVAQUIN  Take 1 tablet (500 mg total) by  mouth daily.     losartan 50 MG tablet  Commonly known as:  COZAAR  Take 50 mg by mouth daily.     MYRBETRIQ 25 MG Tb24 tablet  Generic drug:  mirabegron ER  Take 25 mg by mouth daily.     NITROSTAT 0.4 MG SL tablet  Generic drug:  nitroGLYCERIN  Place 0.4 mg under the tongue every 5 (five) minutes as needed for chest pain.     omeprazole 20 MG capsule  Commonly known as:  PRILOSEC  Take 20 mg by mouth 2 (two) times daily.     predniSONE 20 MG tablet  Commonly known as:  DELTASONE  Take 2 tablets (40 mg total) by mouth daily  with breakfast. 2 tablets daily for 2 days then 1 tablet daily for 3 days then half a tablet daily for 2 days.     REFRESH OPTIVE OP  Place 1 drop into the right eye daily as needed (Dry Eyes).     rosuvastatin 20 MG tablet  Commonly known as:  CRESTOR  Take 20 mg by mouth daily.     sitaGLIPtin-metformin 50-1000 MG per tablet  Commonly known as:  JANUMET  Take 1 tablet by mouth 2 (two) times daily with a meal.     traMADol 50 MG tablet  Commonly known as:  ULTRAM  Take 50 mg by mouth every 8 (eight) hours as needed for pain.           Follow-up Information   Follow up with Delphina Cahill, MD. Schedule an appointment as soon as possible for a visit on 11/24/2012. (at 9:30 with [facility] NP)    Specialty:  Internal Medicine   Contact information:    [place] [address] [phone]       Follow up with Jory Sims, NP. Schedule an appointment as soon as possible for a visit on 11/28/2012. (at 2:00 pm)    Specialty:  Nurse Practitioner   Contact information:   [place] [address] [phone]       Follow up with Rogene Houston, MD In 4 days. (as previously scheduled)    Specialty:  Gastroenterology   Contact information:   [place], [place] [address] [phone]      The results of significant diagnostics from this hospitalization (including imaging, microbiology, ancillary and  laboratory) are listed below for reference.    Significant Diagnostic Studies: Dg Chest 2 View  11/15/2012   CLINICAL DATA:  Shortness of breath, dizziness, chest pain.  EXAM: CHEST  2 VIEW  COMPARISON:  03/17/2012  FINDINGS: Prior median sternotomy and CABG. Mild hyperinflation compatible with COPD. Patchy right lower lung airspace opacity with small right pleural effusion. Cannot exclude pneumonia. Scarring in the left base, stable. No acute bony abnormality.  IMPRESSION: Patchy right lower lung airspace opacity and small right effusion. Cannot exclude pneumonia.  Cardiomegaly, COPD.   Electronically Signed   By: Rolm Baptise   On: 11/15/2012 14:39   Labs: Basic Metabolic Panel:  Recent Labs Lab 11/15/12 1308 11/15/12 1652 11/16/12 0411 11/17/12 0514  NA 140  --  140 140  K 3.0*  --  4.0 3.9  CL 103  --  103 105  CO2 26  --  27 26  GLUCOSE 138*  --  150* 138*  BUN 13  --  12 16  CREATININE 0.86  --  0.84 0.94  CALCIUM 9.3  --  8.9 9.2  MG  --  1.8  --   --    Liver Function Tests:  Recent Labs Lab 11/16/12 0411  AST 14  ALT 7  ALKPHOS 107  BILITOT 0.7  PROT 7.2  ALBUMIN 3.3*   CBC:  Recent Labs Lab 11/15/12 1308 11/16/12 0411 11/17/12 0514  WBC 7.4 6.3 15.4*  NEUTROABS 6.1  --   --   HGB 8.6* 8.5* 8.4*  HCT 29.2* 28.3* 28.4*  MCV 83.7 84.0 85.8  PLT 249 249 278   Cardiac Enzymes:  Recent Labs Lab 11/15/12 1308 11/15/12 1651 11/15/12 2211 11/16/12 0411  TROPONINI <0.30 <0.30 <0.30 <0.30   BNP: BNP (last 3 results)  Recent Labs  11/15/12 1308  PROBNP 2764.0*   CBG:  Recent Labs Lab 11/17/12 0726 11/17/12 1106 11/17/12 1634 11/17/12 2124 11/18/12 0833  GLUCAP 117* 214* 227* 183* 115*       Signed:  Marzetta Board  [facility] 11/18/2012, 6:54 PM

## 2012-11-24 ENCOUNTER — Encounter (HOSPITAL_COMMUNITY)
Admission: RE | Admit: 2012-11-24 | Discharge: 2012-11-24 | Disposition: A | Discharge status: Home / Self Care | Payer: Medicare Other | Source: Ambulatory Visit | Attending: Internal Medicine | Admitting: Internal Medicine

## 2012-11-24 ENCOUNTER — Other Ambulatory Visit: Payer: Self-pay

## 2012-11-24 ENCOUNTER — Ambulatory Visit (HOSPITAL_COMMUNITY)
Admission: RE | Admit: 2012-11-24 | Discharge: 2012-11-24 | Disposition: A | Discharge status: Home / Self Care | Payer: Medicare Other | Source: Ambulatory Visit | Attending: Internal Medicine | Admitting: Internal Medicine

## 2012-11-24 ENCOUNTER — Encounter (HOSPITAL_COMMUNITY)
Admission: RE | Disposition: A | Discharge status: Home / Self Care | Payer: Self-pay | Source: Ambulatory Visit | Attending: Internal Medicine

## 2012-11-24 ENCOUNTER — Encounter (HOSPITAL_COMMUNITY): Payer: Self-pay

## 2012-11-24 DIAGNOSIS — D649 Anemia, unspecified: Secondary | ICD-10-CM | POA: Insufficient documentation

## 2012-11-24 DIAGNOSIS — K922 Gastrointestinal hemorrhage, unspecified: Secondary | ICD-10-CM | POA: Insufficient documentation

## 2012-11-24 DIAGNOSIS — K5730 Diverticulosis of large intestine without perforation or abscess without bleeding: Secondary | ICD-10-CM | POA: Insufficient documentation

## 2012-11-24 DIAGNOSIS — K5520 Angiodysplasia of colon without hemorrhage: Secondary | ICD-10-CM | POA: Insufficient documentation

## 2012-11-24 DIAGNOSIS — D509 Iron deficiency anemia, unspecified: Secondary | ICD-10-CM | POA: Insufficient documentation

## 2012-11-24 HISTORY — PX: GIVENS CAPSULE STUDY: SHX5432

## 2012-11-24 SURGERY — IMAGING PROCEDURE, GI TRACT, INTRALUMINAL, VIA CAPSULE

## 2012-11-24 MED ORDER — SODIUM CHLORIDE 0.9 % IJ SOLN: 10.0000 mL | Freq: Once | INTRAMUSCULAR | Status: DC

## 2012-11-24 MED ORDER — FERUMOXYTOL INJECTION 510 MG/17 ML
510.0000 mg | Freq: Once | INTRAVENOUS | Status: AC
  Administered 2012-11-24: 510 mg via INTRAVENOUS
  Filled 2012-11-24: qty 17

## 2012-11-24 NOTE — H&P (Signed)
Donald Gaines is an 72 y.o. male.   Chief Complaint: Patient is here for small bowel given capsule study. HPI:  Patient is 72 year old Caucasian male with multiple medical problems who was recently evaluated for iron deficiency anemia and GI bleed. He underwent EGD and colonoscopy and no bleeding lesion was found. He is therefore returning for small bowel given capsule study. Patient was recently hospitalized for pneumonia. He was complaining of chest pain. He declined to go to the emergency room. EKG was obtained and did not show any ischemic changes.   Past Medical History  Diagnosis Date  . TIA (transient ischemic attack)     2005  . GERD (gastroesophageal reflux disease)   . Hyperlipidemia   . Hypertension   . Depression   . Diabetes mellitus, type 2   . Restrictive lung disease   . Candida esophagitis   . Hemorrhoids   . Arteriovenous malformation small bowel     Capsule study 3/10  . GI bleeding     Cecal ulcers, arteriovenous malformations  . Chronic diarrhea SEP 2011    ?ETIOLOGY-DIABETIC ENTEROPATHY, LACTOSE INTOLERANCE,  OR IBS-MIXED  . Bloating AUG 2009    NL HBT FOR SIBO  . Anemia FEB 2008 FEDA 2o to AVMs, & large colon ulcers    HB 10.7 MCV 74.3; Dr Tressie Stalker  . BPH (benign prostatic hypertrophy)     Dr Maryland Pink  . Glaucoma   . Cataract   . ASCVD (arteriosclerotic cardiovascular disease)     LIMA to LAD and LIMA to intermediate branch, SVG to PD and second OM. 06/03/93  . Bilateral carotid artery stenosis     50-75% external left carotid stenosis  . Coronary atherosclerosis of native coronary artery     Multivessel, BMS SVG TO RCA 2000, reportedly  "2" additional stents in interim  . Myocardial infarction     1994  . Stroke     mini- strokes in past.States has weakness and numbness of right leg. Short term memory loss  . COPD (chronic obstructive pulmonary disease)   . Sleep apnea   . Portal hypertensive gastropathy 11/08/2012    EGD. Dr. Laural Golden  . AVM  (arteriovenous malformation) of colon 10/2012    Cecum & cecal polyps  . External hemorrhoids 10/2012    Past Surgical History  Procedure Laterality Date  . Esophagogastroduodenoscopy  SEP 09    DUODENAL LIPOMA  . Capsule endo  03/10  . Right inguinal hernia repair    . Hydrogen breath test  2009  . Colonoscopy  APR 2008    SIMPLE ADENOMA, [place] [facility], IH  . Colonoscopy  FEB 2008 ANEMIA. MELENA     2 LARGE ILEOCEAL ULCERS 2o to ASA, [place] TICS, IH  . Colonoscopy  SEP 2009 TRANSFUSION DEP ANEMIA    AC AVM-ABLATED, [place] TICS, IH  . Upper gastrointestinal endoscopy  SEP 2009    GASTRIC AVM ABLATED, NL DUO Bx  . Cardiac catherization  04/2012  . Cardiac catheterization  04/05/1996 [facility]    EF 45%, occluded SVG to circumflex, patent SVG to D1 and PDA and patent LIMA to LAD with severe native vessel disease.  . Cardiac catheterization  08/22/1998 [facility]    Mild LM, severe LAD,severe CX, occlude RCA, patient  SVG to diatal RCA, patent vein graft to D1 and patent LIMA to LAD.   Marland Kitchen Coronary angioplasty with stent placement  08/22/1998 [name]    TEC stenting of the SVG to RCA-Last seen by  cardiologist in 2010.  Marland Kitchen Coronary artery bypass graft  1995-triple bypass    LIMA-LAD, LIMA to intermediate branch, SVG to PD and second OM,  . Cataract extraction w/phaco Right 09/22/2012    Procedure: CATARACT EXTRACTION PHACO AND INTRAOCULAR LENS PLACEMENT (IOC);  Surgeon: Tonny Branch, MD;  Location: [facility];  Service: Ophthalmology;  Laterality: Right;  CDE: 11.43  . Cataract extraction w/phaco Left 10/17/2012    Procedure: CATARACT EXTRACTION PHACO AND INTRAOCULAR LENS PLACEMENT (IOC);  Surgeon: Tonny Branch, MD;  Location: [facility];  Service: Ophthalmology;  Laterality: Left;  CDE: 8.06  . Colonoscopy with esophagogastroduodenoscopy (egd) N/A 11/08/2012    Procedure: COLONOSCOPY WITH ESOPHAGOGASTRODUODENOSCOPY (EGD);  Surgeon: Rogene Houston, MD;  Location: [facility];  Service: Endoscopy;  Laterality:  N/A;  250-rescheduled to 7:30 Ann to notify pt    Family History  Problem Relation Age of Onset  . Heart attack Mother   . Heart disease Mother   . Hypertension Mother    Social History:  reports that he has been smoking Cigarettes.  He has a 60 pack-year smoking history. He has never used smokeless tobacco. He reports that he does not drink alcohol or use illicit drugs.  Allergies:  Allergies  Allergen Reactions  . Penicillins Rash    Medications Prior to Admission  Medication Sig Dispense Refill  . ADVAIR DISKUS 250-50 MCG/DOSE AEPB Inhale 1 puff into the lungs 2 (two) times daily.       Marland Kitchen albuterol (PROVENTIL HFA;VENTOLIN HFA) 108 (90 BASE) MCG/ACT inhaler Inhale 2 puffs into the lungs every 6 (six) hours as needed for wheezing.      Marland Kitchen ALPRAZolam (XANAX) 0.5 MG tablet Take 0.5 mg by mouth 2 (two) times daily as needed for anxiety.       . benzonatate (TESSALON) 100 MG capsule Take 1 capsule (100 mg total) by mouth 3 (three) times daily.  20 capsule  0  . Carboxymethylcellul-Glycerin (REFRESH OPTIVE OP) Place 1 drop into the right eye daily as needed (Dry Eyes).       . carvedilol (COREG) 6.25 MG tablet Take 6.25 mg by mouth 2 (two) times daily with a meal.       . clopidogrel (PLAVIX) 75 MG tablet Take 75 mg by mouth daily.      Marland Kitchen FLUoxetine (PROZAC) 20 MG capsule Take 20 mg by mouth 2 (two) times daily.      . furosemide (LASIX) 40 MG tablet Take 40 mg by mouth daily.       . hydrocodone-acetaminophen (LORCET-HD) 5-500 MG per capsule Take 1 capsule by mouth every 6 (six) hours as needed for pain.      . isosorbide mononitrate (IMDUR) 60 MG 24 hr tablet Take 60 mg by mouth daily.      Marland Kitchen levofloxacin (LEVAQUIN) 500 MG tablet Take 1 tablet (500 mg total) by mouth daily.  7 tablet  0  . losartan (COZAAR) 50 MG tablet Take 50 mg by mouth daily.       Marland Kitchen MYRBETRIQ 25 MG TB24 Take 25 mg by mouth daily.       Marland Kitchen omeprazole (PRILOSEC) 20 MG capsule Take 20 mg by mouth 2 (two) times daily.        . predniSONE (DELTASONE) 20 MG tablet Take 2 tablets (40 mg total) by mouth daily with breakfast. 2 tablets daily for 2 days then 1 tablet daily for 3 days then half a tablet daily for 2 days.  8 tablet  0  .  rosuvastatin (CRESTOR) 20 MG tablet Take 20 mg by mouth daily.      . sitaGLIPtan-metformin (JANUMET) 50-1000 MG per tablet Take 1 tablet by mouth 2 (two) times daily with a meal.        . Tamsulosin HCl (FLOMAX) 0.4 MG CAPS Take 0.4 mg by mouth daily. Takes in AM      . traMADol (ULTRAM) 50 MG tablet Take 50 mg by mouth every 8 (eight) hours as needed for pain.      Marland Kitchen NITROSTAT 0.4 MG SL tablet Place 0.4 mg under the tongue every 5 (five) minutes as needed for chest pain.         No results found for this or any previous visit (from the past 48 hour(s)). No results found.  ROS  There were no vitals taken for this visit. Physical Exam   Assessment/Plan Iron deficiency anemia and GI bleed. No source found on EGD and colonoscopy. Small bowel given capsule study.  Lithzy Bernard U 11/24/2012, 9:20 AM

## 2012-11-24 NOTE — Progress Notes (Signed)
Pt found to have pulse of 47 bpm when doing routine VS. Upon further questioning, pt c/o of chest pain. Stated "I have had some shooting pain in my heart this morning". Dr. Laural Golden notified. Pt took his daily beta blocker along with alprazolam, but has been more than a month since he needed his nitroglycerin. He didn't have his nitro with him today.12 lead EKG ordered and performed. No significant changes noted on 12 lead. Pt instructed to take SL nitro if chest pain persists and to go to the nearest ED.

## 2012-11-25 ENCOUNTER — Encounter (HOSPITAL_COMMUNITY): Payer: Self-pay | Admitting: Internal Medicine

## 2012-11-27 DIAGNOSIS — K5520 Angiodysplasia of colon without hemorrhage: Secondary | ICD-10-CM

## 2012-11-27 DIAGNOSIS — K922 Gastrointestinal hemorrhage, unspecified: Secondary | ICD-10-CM

## 2012-11-27 DIAGNOSIS — D509 Iron deficiency anemia, unspecified: Secondary | ICD-10-CM

## 2012-11-27 DIAGNOSIS — Z9889 Other specified postprocedural states: Secondary | ICD-10-CM

## 2012-11-27 DIAGNOSIS — K5730 Diverticulosis of large intestine without perforation or abscess without bleeding: Secondary | ICD-10-CM

## 2012-11-27 NOTE — Op Note (Signed)
Small Bowel Givens Capsule Study Procedure date:  11/25/2012  Referring Provider:  Dr. Wende Neighbors, MD PCP:  Dr. Nevada Crane, [facility], [place]  Indication for procedure:  Patient is 61 old Caucasian male with chronic GI bleed and iron deficiency anemia. He underwent EGD and colonoscopy recently and no bleeding lesion was identified. He is therefore undergoing further evaluation with small bowel Givens capsule study. Patient is chronically onclopidogrel which seemed to exacerbate his problem.    Findings:   Patient was able to swallow given without any difficulty. Two small diverticula noted at the duodenum. Two small arteriovenous malformations were noted in jejunum. These are located at 2 hours 43 minutes and 6 seconds and 2 hours 43 minutes and 44 seconds. No stigmata of bleeding noted.  First Gastric image:  6 min and 46 sec First Duodenal image: 31 min and 22 sec First Ileo-Cecal Valve image: 7 hr; 25 min and 38 sec First Cecal image: 7 hours 25 min and 41 sec Gastric Passage time: 25 min. Small Bowel Passage time:  6 hr and  54 min.  Summary & Recommendations: Two small duodenal diverticula without stigmata of bleed. Two small arterial venous malformations and change in them without stigmata of bleed. Slow transit of Givens capsule through small bowel with small bowel passage time of 6 hours and 54 min. Patient advised to resume ferrous sulfate 325 mg by mouth twice a day. He will have CBC repeated at the time of office visit with Dr. Wende Neighbors next week.  Office visit in 4 weeks.

## 2012-11-28 ENCOUNTER — Ambulatory Visit (INDEPENDENT_AMBULATORY_CARE_PROVIDER_SITE_OTHER): Payer: Medicare Other | Admitting: Adult Health

## 2012-11-28 ENCOUNTER — Encounter: Payer: Self-pay | Admitting: Adult Health

## 2012-11-28 ENCOUNTER — Encounter: Payer: Medicare Other | Admitting: Adult Health

## 2012-11-28 VITALS — BP 125/65 | HR 53 | Ht 65.0 in | Wt 179.0 lb

## 2012-11-28 DIAGNOSIS — J189 Pneumonia, unspecified organism: Secondary | ICD-10-CM

## 2012-11-28 DIAGNOSIS — I7090 Unspecified atherosclerosis: Secondary | ICD-10-CM

## 2012-11-28 DIAGNOSIS — F17200 Nicotine dependence, unspecified, uncomplicated: Secondary | ICD-10-CM

## 2012-11-28 DIAGNOSIS — I2510 Atherosclerotic heart disease of native coronary artery without angina pectoris: Secondary | ICD-10-CM

## 2012-11-28 NOTE — Addendum Note (Signed)
Addended by: Truett Mainland on: 11/28/2012 03:25 PM   Modules accepted: Orders

## 2012-11-28 NOTE — Progress Notes (Signed)
HPI : [name] is medically complicated 72 y/o patient of Dr. Domenic Polite we are seeing for ongoing assessment and treatment of known CAD, with CABG, BMS to SVG to RCA in 2000, two more subsequent stents to unknown arteries, completed by cardiologist Dr. Truett Mainland, in [facility]; Bilateral carotid artery stenosis, hypertension, hyperlipidemia, history of AV malformation (followed by Dr.Fields in [place]) on Plavix only, diabetes, and other medical issues.   He was recently admitted to [facility] on [facility]2012 in the setting of community acquired pneumonia. Echocardiogram was completed during hospitalization demonstrating overall ejection fraction low normal range with apical hypokinesis the image quality was poor and therefore exact measurements were unable to be obtained. Patient was treated with antibiotics and released 3 days later. He is here for post hospital follow-up.   He comes today without complaints. He continues to cough productively, but offers no complaints of chest pain or diaphoresis. Some DOE is chronic for him.        Allergies  Allergen Reactions  . Penicillins Rash    Current Outpatient Prescriptions  Medication Sig Dispense Refill  . ADVAIR DISKUS 250-50 MCG/DOSE AEPB Inhale 1 puff into the lungs 2 (two) times daily.       Marland Kitchen albuterol (PROVENTIL HFA;VENTOLIN HFA) 108 (90 BASE) MCG/ACT inhaler Inhale 2 puffs into the lungs every 6 (six) hours as needed for wheezing.      Marland Kitchen ALPRAZolam (XANAX) 0.5 MG tablet Take 0.5 mg by mouth 2 (two) times daily as needed for anxiety.       . AVODART 0.5 MG capsule       . benzonatate (TESSALON) 100 MG capsule Take 1 capsule (100 mg total) by mouth 3 (three) times daily.  20 capsule  0  . Carboxymethylcellul-Glycerin (REFRESH OPTIVE OP) Place 1 drop into the right eye daily as needed (Dry Eyes).       . carvedilol (COREG) 6.25 MG tablet Take 6.25 mg by mouth 2 (two) times daily with a meal.       . clopidogrel  (PLAVIX) 75 MG tablet Take 75 mg by mouth daily.      Marland Kitchen FLUoxetine (PROZAC) 20 MG capsule Take 20 mg by mouth 2 (two) times daily.      . furosemide (LASIX) 40 MG tablet Take 40 mg by mouth daily.       . hydrocodone-acetaminophen (LORCET-HD) 5-500 MG per capsule Take 1 capsule by mouth every 6 (six) hours as needed for pain.      . isosorbide mononitrate (IMDUR) 60 MG 24 hr tablet Take 60 mg by mouth daily.      Marland Kitchen losartan (COZAAR) 50 MG tablet Take 50 mg by mouth daily.       Marland Kitchen MYRBETRIQ 25 MG TB24 Take 25 mg by mouth daily.       Marland Kitchen NITROSTAT 0.4 MG SL tablet Place 0.4 mg under the tongue every 5 (five) minutes as needed for chest pain.       Marland Kitchen omeprazole (PRILOSEC) 20 MG capsule Take 20 mg by mouth 2 (two) times daily.       . predniSONE (DELTASONE) 20 MG tablet Take 2 tablets (40 mg total) by mouth daily with breakfast. 2 tablets daily for 2 days then 1 tablet daily for 3 days then half a tablet daily for 2 days.  8 tablet  0  . rosuvastatin (CRESTOR) 20 MG tablet Take 20 mg by mouth daily.      . sitaGLIPtan-metformin (JANUMET) 50-1000 MG  per tablet Take 1 tablet by mouth 2 (two) times daily with a meal.        . Tamsulosin HCl (FLOMAX) 0.4 MG CAPS Take 0.4 mg by mouth daily. Takes in AM      . traMADol (ULTRAM) 50 MG tablet Take 50 mg by mouth every 8 (eight) hours as needed for pain.       No current facility-administered medications for this visit.    Past Medical History  Diagnosis Date  . TIA (transient ischemic attack)     2005  . GERD (gastroesophageal reflux disease)   . Hyperlipidemia   . Hypertension   . Depression   . Diabetes mellitus, type 2   . Restrictive lung disease   . Candida esophagitis   . Hemorrhoids   . Arteriovenous malformation small bowel     Capsule study 3/10  . GI bleeding     Cecal ulcers, arteriovenous malformations  . Chronic diarrhea SEP 2011    ?ETIOLOGY-DIABETIC ENTEROPATHY, LACTOSE INTOLERANCE,  OR IBS-MIXED  . Bloating AUG 2009    NL HBT  FOR SIBO  . Anemia FEB 2008 FEDA 2o to AVMs, & large colon ulcers    HB 10.7 MCV 74.3; Dr Tressie Stalker  . BPH (benign prostatic hypertrophy)     Dr Maryland Pink  . Glaucoma   . Cataract   . ASCVD (arteriosclerotic cardiovascular disease)     LIMA to LAD and LIMA to intermediate branch, SVG to PD and second OM. 06/03/93  . Bilateral carotid artery stenosis     50-75% external left carotid stenosis  . Coronary atherosclerosis of native coronary artery     Multivessel, BMS SVG TO RCA 2000, reportedly  "2" additional stents in interim  . Myocardial infarction     1994  . Stroke     mini- strokes in past.States has weakness and numbness of right leg. Short term memory loss  . COPD (chronic obstructive pulmonary disease)   . Sleep apnea   . Portal hypertensive gastropathy 11/08/2012    EGD. Dr. Laural Golden  . AVM (arteriovenous malformation) of colon 10/2012    Cecum & cecal polyps  . External hemorrhoids 10/2012    Past Surgical History  Procedure Laterality Date  . Esophagogastroduodenoscopy  SEP 09    DUODENAL LIPOMA  . Capsule endo  03/10  . Right inguinal hernia repair    . Hydrogen breath test  2009  . Colonoscopy  APR 2008    SIMPLE ADENOMA, [place] [facility], IH  . Colonoscopy  FEB 2008 ANEMIA. MELENA     2 LARGE ILEOCEAL ULCERS 2o to ASA, [place] TICS, IH  . Colonoscopy  SEP 2009 TRANSFUSION DEP ANEMIA    AC AVM-ABLATED, [place] [facility], IH  . Upper gastrointestinal endoscopy  SEP 2009    GASTRIC AVM ABLATED, NL DUO Bx  . Cardiac catherization  04/2012  . Cardiac catheterization  04/05/1996 [facility]    EF 45%, occluded SVG to circumflex, patent SVG to D1 and PDA and patent LIMA to LAD with severe native vessel disease.  . Cardiac catheterization  08/22/1998 [facility]    Mild LM, severe LAD,severe CX, occlude RCA, patient  SVG to diatal RCA, patent vein graft to D1 and patent LIMA to LAD.   Marland Kitchen Coronary angioplasty with stent placement  08/22/1998 [name]    TEC stenting of the SVG to RCA-Last  seen by cardiologist in 2010.  Marland Kitchen Coronary artery bypass graft  1995-triple bypass    LIMA-LAD, LIMA to  intermediate branch, SVG to PD and second OM,  . Cataract extraction w/phaco Right 09/22/2012    Procedure: CATARACT EXTRACTION PHACO AND INTRAOCULAR LENS PLACEMENT (IOC);  Surgeon: Tonny Branch, MD;  Location: [facility];  Service: Ophthalmology;  Laterality: Right;  CDE: 11.43  . Cataract extraction w/phaco Left 10/17/2012    Procedure: CATARACT EXTRACTION PHACO AND INTRAOCULAR LENS PLACEMENT (IOC);  Surgeon: Tonny Branch, MD;  Location: [facility];  Service: Ophthalmology;  Laterality: Left;  CDE: 8.06  . Colonoscopy with esophagogastroduodenoscopy (egd) N/A 11/08/2012    Procedure: COLONOSCOPY WITH ESOPHAGOGASTRODUODENOSCOPY (EGD);  Surgeon: Rogene Houston, MD;  Location: [facility];  Service: Endoscopy;  Laterality: N/A;  250-rescheduled to 7:30 Ann to notify pt  . Givens capsule study N/A 11/24/2012    Procedure: GIVENS CAPSULE STUDY;  Surgeon: Rogene Houston, MD;  Location: [facility];  Service: Endoscopy;  Laterality: N/A;  730    [id number] of systems complete and found to be negative unless listed above  PHYSICAL EXAM BP 125/65  Pulse 53  Ht 5\' 5"  (1.651 m)  Wt 179 lb (81.194 kg)  BMI 29.79 kg/m2  General: Well developed, well nourished, in no acute distress Head: Eyes PERRLA, No xanthomas.   Normal cephalic and atramatic  Lungs: Clear bilaterally to auscultation, no wheezes or coughing noted. Heart: HRRR S1 S2, without MRG.  Pulses are 2+ & equal.            No carotid bruit. No JVD.   Abdomen: Bowel sounds are positive, abdomen soft and non-tender without masses or                  Hernia's noted. Msk:  Back normal, normal gait. Normal strength and tone for age. Extremities: No clubbing, cyanosis or edema.  DP +1 Neuro: Alert and oriented X 3. Psych:  Good affect, responds appropriately  EKG:ST-T wave abnormality inferior/ anterolateral. Rate of 55 bpm.  ASSESSMENT AND PLAN

## 2012-11-28 NOTE — Patient Instructions (Signed)
Your physician recommends that you schedule a follow-up appointment in: 6 months You will receive a reminder letter two months in advance reminding you to call and schedule your appointment. If you don't receive this letter, please contact our office.  Your physician recommends that you continue on your current medications as directed. Please refer to the Current Medication list given to you today.     

## 2012-11-28 NOTE — Assessment & Plan Note (Signed)
Unfortunately continues to smoke. Cessation is recommended.

## 2012-11-28 NOTE — Progress Notes (Deleted)
, Name: Donald Gaines    DOB: 1940/12/22  Age: 72 y.o.  MR#: [id number]       PCP:  Delphina Cahill, MD      Insurance: Payor: [employer] MEDICARE / Plan: [place] / Product Type: *No Product type* /   CC:    Chief Complaint  Patient presents with  . Coronary Artery Disease  . Hypertension    VS Filed Vitals:   11/28/12 1403  BP: 125/65  Pulse: 53  Height: 5\' 5"  (1.651 m)  Weight: 179 lb (81.194 kg)    Weights Current Weight  11/28/12 179 lb (81.194 kg)  11/16/12 173 lb 11.2 oz (78.79 kg)  11/08/12 182 lb (82.555 kg)    Blood Pressure  BP Readings from Last 3 Encounters:  11/28/12 125/65  11/24/12 121/59  11/18/12 157/80     Admit date:  (Not on file) Last encounter with RMR:  Visit date not found   Allergy Penicillins  Current Outpatient Prescriptions  Medication Sig Dispense Refill  . ADVAIR DISKUS 250-50 MCG/DOSE AEPB Inhale 1 puff into the lungs 2 (two) times daily.       Marland Kitchen albuterol (PROVENTIL HFA;VENTOLIN HFA) 108 (90 BASE) MCG/ACT inhaler Inhale 2 puffs into the lungs every 6 (six) hours as needed for wheezing.      Marland Kitchen ALPRAZolam (XANAX) 0.5 MG tablet Take 0.5 mg by mouth 2 (two) times daily as needed for anxiety.       . AVODART 0.5 MG capsule       . benzonatate (TESSALON) 100 MG capsule Take 1 capsule (100 mg total) by mouth 3 (three) times daily.  20 capsule  0  . Carboxymethylcellul-Glycerin (REFRESH OPTIVE OP) Place 1 drop into the right eye daily as needed (Dry Eyes).       . carvedilol (COREG) 6.25 MG tablet Take 6.25 mg by mouth 2 (two) times daily with a meal.       . clopidogrel (PLAVIX) 75 MG tablet Take 75 mg by mouth daily.      Marland Kitchen FLUoxetine (PROZAC) 20 MG capsule Take 20 mg by mouth 2 (two) times daily.      . furosemide (LASIX) 40 MG tablet Take 40 mg by mouth daily.       . hydrocodone-acetaminophen (LORCET-HD) 5-500 MG per capsule Take 1 capsule by mouth every 6 (six) hours as needed for pain.      . isosorbide mononitrate  (IMDUR) 60 MG 24 hr tablet Take 60 mg by mouth daily.      Marland Kitchen losartan (COZAAR) 50 MG tablet Take 50 mg by mouth daily.       Marland Kitchen MYRBETRIQ 25 MG TB24 Take 25 mg by mouth daily.       Marland Kitchen NITROSTAT 0.4 MG SL tablet Place 0.4 mg under the tongue every 5 (five) minutes as needed for chest pain.       Marland Kitchen omeprazole (PRILOSEC) 20 MG capsule Take 20 mg by mouth 2 (two) times daily.       . predniSONE (DELTASONE) 20 MG tablet Take 2 tablets (40 mg total) by mouth daily with breakfast. 2 tablets daily for 2 days then 1 tablet daily for 3 days then half a tablet daily for 2 days.  8 tablet  0  . rosuvastatin (CRESTOR) 20 MG tablet Take 20 mg by mouth daily.      . sitaGLIPtan-metformin (JANUMET) 50-1000 MG per tablet Take 1 tablet by mouth 2 (two) times daily with a meal.        .  Tamsulosin HCl (FLOMAX) 0.4 MG CAPS Take 0.4 mg by mouth daily. Takes in AM      . traMADol (ULTRAM) 50 MG tablet Take 50 mg by mouth every 8 (eight) hours as needed for pain.       No current facility-administered medications for this visit.    Discontinued Meds:    Medications Discontinued During This Encounter  Medication Reason  . levofloxacin (LEVAQUIN) 500 MG tablet Error    Patient Active Problem List   Diagnosis Date Noted  . Community acquired pneumonia 11/15/2012  . Acute respiratory failure with hypoxia 11/15/2012  . Hypokalemia 11/15/2012  . Chronic GI bleeding 11/15/2012  . COPD with acute exacerbation 11/15/2012  . Atypical chest pain 11/15/2012  . Essential hypertension, benign 07/28/2010  . Mixed hyperlipidemia 07/28/2010  . CORONARY ATHEROSCLEROSIS NATIVE CORONARY ARTERY 12/24/2009  . IBS 11/20/2009  . ARTERIOVENOUS MALFORMATION 09/20/2008  . COLONIC POLYPS, ADENOMATOUS 09/13/2008  . DIABETES MELLITUS 09/13/2008  . TOBACCO ABUSE 09/13/2008  . HEMORRHOIDS, INTERNAL 09/13/2008  . EOSINOPHILIC ESOPHAGITIS [id number]  . FATTY LIVER DISEASE 09/13/2008  . ANEMIA-IRON DEFICIENCY 05/25/2008  . GERD  05/25/2008  . CONSTIPATION 05/25/2008  . DIARRHEA 05/25/2008    LABS    Component Value Date/Time   NA 140 11/17/2012 0514   NA 140 11/16/2012 0411   NA 140 11/15/2012 1308   K 3.9 11/17/2012 0514   K 4.0 11/16/2012 0411   K 3.0* 11/15/2012 1308   CL 105 11/17/2012 0514   CL 103 11/16/2012 0411   CL 103 11/15/2012 1308   CO2 26 11/17/2012 0514   CO2 27 11/16/2012 0411   CO2 26 11/15/2012 1308   GLUCOSE 138* 11/17/2012 0514   GLUCOSE 150* 11/16/2012 0411   GLUCOSE 138* 11/15/2012 1308   BUN 16 11/17/2012 0514   BUN 12 11/16/2012 0411   BUN 13 11/15/2012 1308   CREATININE 0.94 11/17/2012 0514   CREATININE 0.84 11/16/2012 0411   CREATININE 0.86 11/15/2012 1308   CREATININE 1.09 03/17/2012 1555   CREATININE 1.00 01/19/2011 1416   CREATININE 0.94 01/27/2010   CALCIUM 9.2 11/17/2012 0514   CALCIUM 8.9 11/16/2012 0411   CALCIUM 9.3 11/15/2012 1308   GFRNONAA 83* 11/17/2012 0514   GFRNONAA 87* 11/16/2012 0411   GFRNONAA 86* 11/15/2012 1308   GFRAA >90 11/17/2012 0514   GFRAA >90 11/16/2012 0411   GFRAA >90 11/15/2012 1308   CMP     Component Value Date/Time   NA 140 11/17/2012 0514   K 3.9 11/17/2012 0514   CL 105 11/17/2012 0514   CO2 26 11/17/2012 0514   GLUCOSE 138* 11/17/2012 0514   BUN 16 11/17/2012 0514   CREATININE 0.94 11/17/2012 0514   CREATININE 1.09 03/17/2012 1555   CALCIUM 9.2 11/17/2012 0514   PROT 7.2 11/16/2012 0411   ALBUMIN 3.3* 11/16/2012 0411   AST 14 11/16/2012 0411   AST 12 01/27/2010   ALT 7 11/16/2012 0411   ALKPHOS 107 11/16/2012 0411   ALKPHOS 96 01/27/2010   BILITOT 0.7 11/16/2012 0411   GFRNONAA 83* 11/17/2012 0514   GFRAA >90 11/17/2012 0514       Component Value Date/Time   WBC 15.4* 11/17/2012 0514   WBC 6.3 11/16/2012 0411   WBC 7.4 11/15/2012 1308   HGB 8.4* 11/17/2012 0514   HGB 8.5* 11/16/2012 0411   HGB 8.6* 11/15/2012 1308   HCT 28.4* 11/17/2012 0514   HCT 28.3* 11/16/2012 0411   HCT 29.2* 11/15/2012 1308   MCV 85.8 11/17/2012  0514   MCV 84.0 11/16/2012 0411   MCV 83.7  11/15/2012 1308    Lipid Panel     Component Value Date/Time   CHOL 120 01/07/2011 1050   TRIG 167* 01/07/2011 1050   HDL 37* 01/07/2011 1050   CHOLHDL 3.2 01/07/2011 1050   VLDL 33 01/07/2011 1050   LDLCALC 50 01/07/2011 1050    ABG No results found for this basename: phart, pco2, pco2art, po2, po2art, hco3, tco2, acidbasedef, o2sat     Lab Results  Component Value Date   TSH 3.423 11/15/2012   BNP (last 3 results)  Recent Labs  11/15/12 1308  PROBNP 2764.0*   Cardiac Panel (last 3 results) No results found for this basename: CKTOTAL, CKMB, TROPONINI, RELINDX,  in the last 72 hours  Iron/TIBC/Ferritin    Component Value Date/Time   IRON 19* 11/02/2012 0000   TIBC 434 11/02/2012 0000   FERRITIN 15* 11/02/2012 0000     EKG Orders placed during the hospital encounter of 11/24/12  . EKG 12-LEAD  . EKG 12-LEAD  . EKG     Prior Assessment and Plan Problem List as of 11/28/2012     Cardiovascular and Mediastinum   CORONARY ATHEROSCLEROSIS NATIVE CORONARY ARTERY   Last Assessment & Plan   04/05/2012 Office Visit Written 04/05/2012 12:03 PM by Lendon Colonel, NP     Review of cardiac cath results discussed with the patient. He has collateral flow noted around occlusion of the SVG and Ramus. Will continue to treat him with medical therapy to include ongoing use of ASA, plavix, statin, imdur, and coreg. He is advised to stop smoking completely. As cath report is reassuring, I recommend follow up with PCP or even pulmonologist for ongoing evaluation of lung disease causing his symptoms. The patient verbalizes understanding is will to continue risk management and discuss other options.     HEMORRHOIDS, INTERNAL   ARTERIOVENOUS MALFORMATION   Last Assessment & Plan   03/17/2012 Office Visit Written 03/17/2012  4:30 PM by Lendon Colonel, NP     Followed by Dr. Barney Drain in [place]. No complaints of abdominal pain or bleeding.     Essential hypertension, benign    Last Assessment & Plan   04/05/2012 Office Visit Written 04/05/2012 12:03 PM by Lendon Colonel, NP     Excellent control of BP presently. Even on the low normal side. He denies dizziness or near syncope. No changes at this time.      Respiratory   Community acquired pneumonia   Acute respiratory failure with hypoxia   COPD with acute exacerbation     Digestive   COLONIC POLYPS, ADENOMATOUS   EOSINOPHILIC ESOPHAGITIS   GERD   Last Assessment & Plan   01/19/2011 Office Visit Written 01/20/2011  1:30 PM by Andria Meuse, NP     Controlled on PPI     CONSTIPATION   IBS   FATTY LIVER DISEASE   Chronic GI bleeding     Endocrine   DIABETES MELLITUS     Other   [facility] DEFICIENCY   Last Assessment & Plan   03/17/2012 Office Visit Written 03/17/2012  4:29 PM by Lendon Colonel, NP     Most recent labs completed on 03/02/2012 Hgb 12.7. Hct 38.7. Platelets 174. Followed by Dr. Lexine Baton.     TOBACCO ABUSE   Last Assessment & Plan   04/05/2012 Office Visit Written 04/05/2012 12:05 PM by Lendon Colonel, NP     Further discussion  on the need to quit. He will need to be evaluated by pulmonologist for ongoing assessment for lung function and need for supplemental oxygen if necessary. Per patient, he is unable to tolerate CPAP. Will defer to PCP    DIARRHEA   Last Assessment & Plan   03/05/2011 Office Visit Written 03/05/2011 10:29 AM by Danie Binder, MD     MOST LIKELY 2O TO DIABETIC ENTEROPATHY & IBS. Sx improved with imodium and align.  CONTINUE IMODIUM. CONTINUE ALIGN OR [employer] PROBIOTIC EVERY DAY. FOLLOW UP IN 4 MOS.    Mixed hyperlipidemia   Last Assessment & Plan   07/08/2011 Office Visit Written 07/08/2011  1:32 PM by Satira Sark, MD     Continues on statin therapy.    Hypokalemia   Atypical chest pain       Imaging: Dg Chest 2 View  11/15/2012   CLINICAL DATA:  Shortness of breath, dizziness, chest pain.  EXAM: CHEST  2 VIEW  COMPARISON:   03/17/2012  FINDINGS: Prior median sternotomy and CABG. Mild hyperinflation compatible with COPD. Patchy right lower lung airspace opacity with small right pleural effusion. Cannot exclude pneumonia. Scarring in the left base, stable. No acute bony abnormality.  IMPRESSION: Patchy right lower lung airspace opacity and small right effusion. Cannot exclude pneumonia.  Cardiomegaly, COPD.   Electronically Signed   By: Rolm Baptise   On: 11/15/2012 14:39

## 2012-11-28 NOTE — Assessment & Plan Note (Signed)
Continues to recover from this.  Still having some coughing episodes. To see PCP for follow up.

## 2012-11-28 NOTE — Assessment & Plan Note (Signed)
EKG is abnormal but medical management is to continue. He is without complaints of angina. He has some chest wall pain from frequent coughing from pneumonia. Cath 8 months ago did not recommend revascularization or intervention. He will continue on current medication regimen with risk factor management.

## 2012-12-01 ENCOUNTER — Encounter (HOSPITAL_COMMUNITY): Payer: Medicare Other

## 2012-12-22 ENCOUNTER — Encounter (HOSPITAL_COMMUNITY): Payer: Self-pay | Admitting: Oncology

## 2012-12-23 ENCOUNTER — Other Ambulatory Visit: Payer: Self-pay | Admitting: Cardiology

## 2012-12-27 ENCOUNTER — Encounter (INDEPENDENT_AMBULATORY_CARE_PROVIDER_SITE_OTHER): Payer: Self-pay | Admitting: Internal Medicine

## 2012-12-27 ENCOUNTER — Ambulatory Visit (INDEPENDENT_AMBULATORY_CARE_PROVIDER_SITE_OTHER): Payer: Medicare Other | Admitting: Internal Medicine

## 2012-12-27 VITALS — BP 104/72 | HR 66 | Temp 97.7°F | Resp 16 | Ht 65.5 in | Wt 173.9 lb

## 2012-12-27 DIAGNOSIS — K219 Gastro-esophageal reflux disease without esophagitis: Secondary | ICD-10-CM

## 2012-12-27 DIAGNOSIS — D509 Iron deficiency anemia, unspecified: Secondary | ICD-10-CM

## 2012-12-27 DIAGNOSIS — R195 Other fecal abnormalities: Secondary | ICD-10-CM

## 2012-12-27 NOTE — Progress Notes (Signed)
Presenting complaint;  Followup for iron deficiency anemia and occult GI bleed.  Database; Patient is 72 year old Caucasian male with multiple medical problems who was evaluated in August 2014 for heme positive stools and drop in his H&H. He is on Plavix chronically. 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 He underwent EGD and colonoscopy on 11/08/2012. EGD revealed portal gastropathy otherwise normal exam. Colonoscopy reveals single small AV malformation without stigmata bleed. 5 small cecal polyps were removed via cold biopsy and cold snare and the stone out to be tubular adenomas. He also had rectal polyp removed which was hyperplastic. Since  No bleeding lesion was identified he underwent small bowel given capsule study 11/25/2012 revealing 2 small duodenal diverticula and 2 jejunal AV malformations doubt stigmata of bleed. Because of portal gastropathy on EGD is recommended abdominopelvic CT but this could not be approved. He now returns for followup visit.  Subjective: He states he is feeling better. He saw Dr. [place] Crane 4 weeks ago and his hemoglobin was 10.6 g. He denies melena or rectal bleeding. He also denies abdominal pain nausea or vomiting. Heartburn is well controlled with therapy. He states he does not have good appetite. He has lost 8 pounds since his last visit 8 weeks ago. He does not take OTC NSAIDs.   Current Medications: Current Outpatient Prescriptions  Medication Sig Dispense Refill  . ADVAIR DISKUS 250-50 MCG/DOSE AEPB Inhale 1 puff into the lungs 2 (two) times daily.       Marland Kitchen albuterol (PROVENTIL HFA;VENTOLIN HFA) 108 (90 BASE) MCG/ACT inhaler Inhale 2 puffs into the lungs every 6 (six) hours as needed for wheezing.      Marland Kitchen ALPRAZolam (XANAX) 0.5 MG tablet Take 0.5 mg by mouth 2 (two) times daily as needed for anxiety.       . AVODART 0.5 MG capsule Take 0.5 mg by mouth 2 (two) times daily.       . Carboxymethylcellul-Glycerin (REFRESH OPTIVE  OP) Place 1 drop into the right eye daily as needed (Dry Eyes).       . carvedilol (COREG) 6.25 MG tablet Take 6.25 mg by mouth 2 (two) times daily with a meal.       . clopidogrel (PLAVIX) 75 MG tablet Take 75 mg by mouth daily.      . ferumoxytol (FERAHEME) 510 MG/17ML SOLN injection Inject 510 mg into the vein as needed. Last infusion was 11-2012      . FLUoxetine (PROZAC) 20 MG capsule Take 20 mg by mouth 2 (two) times daily.      . furosemide (LASIX) 40 MG tablet Take 40 mg by mouth daily.       . hydrocodone-acetaminophen (LORCET-HD) 5-500 MG per capsule Take 1 capsule by mouth every 6 (six) hours as needed for pain.      . isosorbide mononitrate (IMDUR) 60 MG 24 hr tablet TAKE 1 TABLET BY MOUTH EVERY DAY  30 tablet  11  . losartan (COZAAR) 50 MG tablet Take 50 mg by mouth daily.       Marland Kitchen MYRBETRIQ 25 MG TB24 Take 25 mg by mouth daily.       Marland Kitchen NITROSTAT 0.4 MG SL tablet Place 0.4 mg under the tongue every 5 (five) minutes as needed for chest pain.       Marland Kitchen omeprazole (PRILOSEC) 20 MG capsule Take 20 mg by mouth 2 (two) times daily.       . rosuvastatin (CRESTOR) 20 MG tablet Take 20 mg by  mouth daily.      . sitaGLIPtan-metformin (JANUMET) 50-1000 MG per tablet Take 1 tablet by mouth 2 (two) times daily with a meal.        . Tamsulosin HCl (FLOMAX) 0.4 MG CAPS Take 0.4 mg by mouth daily. Takes in AM      . traMADol (ULTRAM) 50 MG tablet Take 50 mg by mouth every 8 (eight) hours as needed for pain.       No current facility-administered medications for this visit.    Objective: Blood pressure 104/72, pulse 66, temperature 97.7 F (36.5 C), temperature source Oral, resp. rate 16, height 5' 5.5" (1.664 m), weight 173 lb 14.4 oz (78.881 kg). Patient is alert and in no acute distress. Conjunctiva is pink. Sclera is nonicteric Oropharyngeal mucosa is normal. No neck masses or thyromegaly noted. Cardiac exam with regular rhythm normal S1 and S2. No murmur or gallop noted. Lungs are clear to  auscultation. Abdomen is soft and nontender without organomegaly or masses.  No LE edema or clubbing noted.  Labs/studies Results: CBC and LFT on 11/28/2012 WBC 7.9, H&H 10.6 and 35.6 and platelet count 229K Bilirubin 0.5, AP 109, AST 20, ALT 17 and albumin 3.5  Assessment:  #1. Iron deficiency anemia. No evidence of overt GI bleed but his stool was heme positive. He possibly bleeds intermittently from small bowel in the setting of chronic Plavix use. Hopefully we can maintain hemoglobin above 10 g. #2. GERD. Symptoms well-controlled with therapy. #3. Portal gastropathy noted on EGD but he does not have any other stigmata of chronic liver disease. No further workup planned at this time.    Plan:  CBC in 1 month. Weight check in 2 months. Office visit in 4 months. Patient will call if he experiences melena or rectal bleeding.

## 2012-12-27 NOTE — Patient Instructions (Addendum)
Please notify office when you have blood work done through oncology clinic. Notify if you have rectal bleeding or tarry stools. Weights check in 2 months.

## 2012-12-30 ENCOUNTER — Ambulatory Visit (INDEPENDENT_AMBULATORY_CARE_PROVIDER_SITE_OTHER): Payer: Medicare Other | Admitting: Internal Medicine

## 2013-01-02 ENCOUNTER — Ambulatory Visit (INDEPENDENT_AMBULATORY_CARE_PROVIDER_SITE_OTHER): Payer: Medicare Other | Admitting: Internal Medicine

## 2013-01-09 ENCOUNTER — Other Ambulatory Visit: Payer: Self-pay | Admitting: Cardiology

## 2013-01-25 ENCOUNTER — Other Ambulatory Visit: Payer: Self-pay | Admitting: Cardiology

## 2013-01-25 ENCOUNTER — Encounter (HOSPITAL_COMMUNITY): Payer: Self-pay | Admitting: Oncology

## 2013-01-27 ENCOUNTER — Encounter (INDEPENDENT_AMBULATORY_CARE_PROVIDER_SITE_OTHER): Payer: Self-pay

## 2013-05-01 ENCOUNTER — Ambulatory Visit (INDEPENDENT_AMBULATORY_CARE_PROVIDER_SITE_OTHER): Payer: Medicare HMO | Admitting: Internal Medicine

## 2013-05-01 ENCOUNTER — Encounter (INDEPENDENT_AMBULATORY_CARE_PROVIDER_SITE_OTHER): Payer: Self-pay | Admitting: Internal Medicine

## 2013-05-01 VITALS — BP 130/72 | HR 66 | Temp 97.0°F | Resp 16 | Ht 65.5 in | Wt 172.1 lb

## 2013-05-01 DIAGNOSIS — K219 Gastro-esophageal reflux disease without esophagitis: Secondary | ICD-10-CM

## 2013-05-01 DIAGNOSIS — D509 Iron deficiency anemia, unspecified: Secondary | ICD-10-CM

## 2013-05-01 LAB — CBC
HEMATOCRIT: 30.6 % — AB (ref 39.0–52.0)
Hemoglobin: 9.4 g/dL — ABNORMAL LOW (ref 13.0–17.0)
MCH: 25.1 pg — AB (ref 26.0–34.0)
MCHC: 30.7 g/dL (ref 30.0–36.0)
MCV: 81.8 fL (ref 78.0–100.0)
Platelets: 257 10*3/uL (ref 150–400)
RBC: 3.74 MIL/uL — ABNORMAL LOW (ref 4.22–5.81)
RDW: 16 % — AB (ref 11.5–15.5)
WBC: 5 10*3/uL (ref 4.0–10.5)

## 2013-05-01 LAB — IRON AND TIBC
%SAT: 7 % — ABNORMAL LOW (ref 20–55)
Iron: 31 ug/dL — ABNORMAL LOW (ref 42–165)
TIBC: 472 ug/dL — ABNORMAL HIGH (ref 215–435)
UIBC: 441 ug/dL — AB (ref 125–400)

## 2013-05-01 MED ORDER — OMEPRAZOLE 20 MG PO CPDR: 20.0000 mg | DELAYED_RELEASE_CAPSULE | Freq: Every day | ORAL | Status: DC

## 2013-05-01 NOTE — Progress Notes (Signed)
Presenting complaint;  Follow for iron deficiency anemia, GI bleed and GERD.  Subjective:  Patient is 73 year old Caucasian male who presents for scheduled visit. He was last seen 4 months ago. He recalled that he had single episode of melena 2 months ago. His appetite is fair. He generally eats 2 meals a day. Heartburn is well controlled with therapy. He had one episode after eating taco salad 3 weeks ago. His bowels move daily. He has occasional fleeting hypogastric pain. He denies dysuria or hematuria. He tells me he has been divorced since his last visit and now lives with his sister. He complains of left knee pain and swelling. He states he has had steroid injections in his knee. He will followup with PCP regarding knee problem. He continues to smoke cigarettes. He smoking a pack a day. Last iron infusion was on 11/24/2012.          Current Medications: Current Outpatient Prescriptions  Medication Sig Dispense Refill  . ADVAIR DISKUS 250-50 MCG/DOSE AEPB Inhale 1 puff into the lungs 2 (two) times daily.       Marland Kitchen albuterol (PROVENTIL HFA;VENTOLIN HFA) 108 (90 BASE) MCG/ACT inhaler Inhale 2 puffs into the lungs every 6 (six) hours as needed for wheezing.      Marland Kitchen ALPRAZolam (XANAX) 0.5 MG tablet Take 0.5 mg by mouth 2 (two) times daily as needed for anxiety.       . AVODART 0.5 MG capsule Take 0.5 mg by mouth daily.       . Carboxymethylcellul-Glycerin (REFRESH OPTIVE OP) Place 1 drop into the right eye 3 (three) times daily. For both eyes      . carvedilol (COREG) 6.25 MG tablet Take 6.25 mg by mouth 2 (two) times daily with a meal.       . cetirizine (ZYRTEC) 10 MG tablet Take 10 mg by mouth daily.      . clopidogrel (PLAVIX) 75 MG tablet Take 75 mg by mouth daily.      . CRESTOR 20 MG tablet TAKE 1 TABLET BY MOUTH EVERY DAY  30 tablet  5  . ferumoxytol (FERAHEME) 510 MG/17ML SOLN injection Inject 510 mg into the vein as needed. Last infusion was 11-2012      . FLUoxetine (PROZAC)  20 MG capsule Take 20 mg by mouth 2 (two) times daily.      . furosemide (LASIX) 40 MG tablet Take 40 mg by mouth daily.       . isosorbide mononitrate (IMDUR) 60 MG 24 hr tablet TAKE 1 TABLET BY MOUTH EVERY DAY  30 tablet  11  . losartan (COZAAR) 50 MG tablet Take 50 mg by mouth daily.       Marland Kitchen NITROSTAT 0.4 MG SL tablet Place 0.4 mg under the tongue every 5 (five) minutes as needed for chest pain.       Marland Kitchen omeprazole (PRILOSEC) 20 MG capsule Take 1 capsule (20 mg total) by mouth daily.  30 capsule  5  . sitaGLIPtan-metformin (JANUMET) 50-1000 MG per tablet Take 1 tablet by mouth 2 (two) times daily with a meal.        . Tamsulosin HCl (FLOMAX) 0.4 MG CAPS Take 0.4 mg by mouth daily. Takes in AM      . traMADol (ULTRAM) 50 MG tablet Take 50 mg by mouth every 8 (eight) hours as needed for pain.       No current facility-administered medications for this visit.     Objective: Blood pressure 130/72, pulse 66,  temperature 97 F (36.1 C), temperature source Oral, resp. rate 16, height 5' 5.5" (1.664 m), weight 172 lb 1.6 oz (78.064 kg). Patient appears pale but this is his normal complexion. Conjunctiva is pink. Sclera is nonicteric Oropharyngeal mucosa is normal. No neck masses or thyromegaly noted. Cardiac exam with regular rhythm normal S1 and S2. He has faint systolic ejection murmur best heard at LLSB. Lungs are clear to auscultation. Abdomen symmetrical soft and nontender. No organomegaly or masses.  No LE edema or clubbing noted.  Labs/studies Results: Lab data from 11/28/2012. H&H 10.6 and 35.6 MCV 86.2 and platelet count 229K. Ceramide 113 TIBC 362 and saturation 31%   Assessment:  #1. Iron deficiency anemia secondary to GI bleed from small bowel possibly from an AV malformations as a result of antiplatelet therapy. He underwent EGD colonoscopy and given capsule study last year. Goal is to keep his hemoglobin at least above 10 g. #2. GERD. Symptoms well-controlled with therapy.  Will try him on single-dose PPI.   Plan:  Decrease omeprazole 20 mg by mouth every morning. Call if symptoms relapse. Patient will go to the lab for CBC, serum iron, TIBC and serum ferritin. Office visit in 4 months.

## 2013-05-01 NOTE — Patient Instructions (Signed)
Physician will contact you with results of blood work. Take Prilosec or oemprazole every morning and clopidogrel every evening

## 2013-05-02 LAB — FERRITIN: Ferritin: 11 ng/mL — ABNORMAL LOW (ref 22–322)

## 2013-05-08 ENCOUNTER — Telehealth (INDEPENDENT_AMBULATORY_CARE_PROVIDER_SITE_OTHER): Payer: Self-pay | Admitting: *Deleted

## 2013-05-08 DIAGNOSIS — D509 Iron deficiency anemia, unspecified: Secondary | ICD-10-CM

## 2013-05-08 NOTE — Telephone Encounter (Signed)
Per Dr.Rehman the patient will need to have labs drawn in 8 weeks. 

## 2013-05-30 ENCOUNTER — Other Ambulatory Visit: Payer: Self-pay | Admitting: Cardiology

## 2013-05-30 MED ORDER — ROSUVASTATIN CALCIUM 20 MG PO TABS: ORAL_TABLET | ORAL | Status: DC

## 2013-05-31 ENCOUNTER — Telehealth: Payer: Self-pay | Admitting: Adult Health

## 2013-05-31 NOTE — Telephone Encounter (Signed)
Received fax refill request  Rx #  Medication:   Qty  Sig:   Physician:    **Please see paper in refill bin/tgs**

## 2013-05-31 NOTE — Telephone Encounter (Signed)
Prior Auth in Jory Sims, NP folder to sign.

## 2013-06-01 ENCOUNTER — Telehealth: Payer: Self-pay | Admitting: Adult Health

## 2013-06-01 MED ORDER — ISOSORBIDE MONONITRATE ER 60 MG PO TB24: 60.0000 mg | ORAL_TABLET | Freq: Every day | ORAL | Status: DC

## 2013-06-01 NOTE — Telephone Encounter (Signed)
RX refill from [facility] for isosorbide 60mg  #90

## 2013-06-01 NOTE — Telephone Encounter (Signed)
Please see paper in refill bin / tgs  °

## 2013-06-01 NOTE — Telephone Encounter (Signed)
imdur refill complete

## 2013-06-02 ENCOUNTER — Ambulatory Visit (INDEPENDENT_AMBULATORY_CARE_PROVIDER_SITE_OTHER): Payer: Medicare HMO | Admitting: Adult Health

## 2013-06-02 ENCOUNTER — Encounter: Payer: Self-pay | Admitting: Adult Health

## 2013-06-02 VITALS — BP 140/58 | HR 77 | Ht 65.0 in | Wt 176.0 lb

## 2013-06-02 DIAGNOSIS — F17200 Nicotine dependence, unspecified, uncomplicated: Secondary | ICD-10-CM

## 2013-06-02 DIAGNOSIS — I10 Essential (primary) hypertension: Secondary | ICD-10-CM

## 2013-06-02 DIAGNOSIS — I7090 Unspecified atherosclerosis: Secondary | ICD-10-CM

## 2013-06-02 DIAGNOSIS — I2510 Atherosclerotic heart disease of native coronary artery without angina pectoris: Secondary | ICD-10-CM

## 2013-06-02 NOTE — Assessment & Plan Note (Signed)
He is to be treated medically only. As he has no target areas for revascularization. He continues to use nitroglycerin as needed, and remains on isosorbide 60 mg daily. Most recent labs were completed in August of 2014. All appear to be within normal limits. He will continue his current medication regimen. Again I have spoke with him about smoking cessation

## 2013-06-02 NOTE — Assessment & Plan Note (Signed)
Blood pressure is well-controlled currently. I will not make any changes in his medications at this time. He assures me that his sister provides his medication and make sure that he takes it each day.

## 2013-06-02 NOTE — Progress Notes (Deleted)
Name: Donald Gaines    DOB: [birth year]  Age: 73 y.o.  MR#: [id number]       PCP:  Delphina Cahill, MD      Insurance: Payor: [employer] Falling MEDICARE / Plan: [employer] MEDICARE HMO/PPO / Product Type: *No Product type* /   CC:    Chief Complaint  Patient presents with  . Coronary Artery Disease    VS Filed Vitals:   06/02/13 1448  BP: 140/58  Pulse: 77  Height: 5\' 5"  (1.651 m)  Weight: 176 lb (79.833 kg)    Weights Current Weight  06/02/13 176 lb (79.833 kg)  05/01/13 172 lb 1.6 oz (78.064 kg)  12/27/12 173 lb 14.4 oz (78.881 kg)    Blood Pressure  BP Readings from Last 3 Encounters:  06/02/13 140/58  05/01/13 130/72  12/27/12 104/72     Admit date:  (Not on file) Last encounter with RMR:  06/01/2013   Allergy Penicillins  Current Outpatient Prescriptions  Medication Sig Dispense Refill  . ADVAIR DISKUS 250-50 MCG/DOSE AEPB Inhale 1 puff into the lungs 2 (two) times daily.       Marland Kitchen albuterol (PROVENTIL HFA;VENTOLIN HFA) 108 (90 BASE) MCG/ACT inhaler Inhale 2 puffs into the lungs every 6 (six) hours as needed for wheezing.      Marland Kitchen ALPRAZolam (XANAX) 0.5 MG tablet Take 0.5 mg by mouth 2 (two) times daily as needed for anxiety.       . AVODART 0.5 MG capsule Take 0.5 mg by mouth daily.       . Carboxymethylcellul-Glycerin (REFRESH OPTIVE OP) Place 1 drop into the right eye 3 (three) times daily. For both eyes      . carvedilol (COREG) 6.25 MG tablet Take 6.25 mg by mouth 2 (two) times daily with a meal.       . cetirizine (ZYRTEC) 10 MG tablet Take 10 mg by mouth daily.      . clopidogrel (PLAVIX) 75 MG tablet Take 75 mg by mouth daily.      Marland Kitchen FLUoxetine (PROZAC) 20 MG capsule Take 20 mg by mouth 2 (two) times daily.      . furosemide (LASIX) 40 MG tablet Take 40 mg by mouth daily.       . isosorbide mononitrate (IMDUR) 60 MG 24 hr tablet Take 1 tablet (60 mg total) by mouth daily.  30 tablet  11  . losartan (COZAAR) 50 MG tablet Take 50 mg by mouth daily.       . mirabegron ER  (MYRBETRIQ) 25 MG TB24 tablet Take 25 mg by mouth daily.      Marland Kitchen NITROSTAT 0.4 MG SL tablet Place 0.4 mg under the tongue every 5 (five) minutes as needed for chest pain.       Marland Kitchen omeprazole (PRILOSEC) 20 MG capsule Take 1 capsule (20 mg total) by mouth daily.  30 capsule  5  . rosuvastatin (CRESTOR) 20 MG tablet TAKE 1 TABLET BY MOUTH EVERY DAY  30 tablet  5  . sitaGLIPtan-metformin (JANUMET) 50-1000 MG per tablet Take 1 tablet by mouth 2 (two) times daily with a meal.        . Tamsulosin HCl (FLOMAX) 0.4 MG CAPS Take 0.4 mg by mouth daily. Takes in AM      . traMADol (ULTRAM) 50 MG tablet Take 50 mg by mouth every 8 (eight) hours as needed for pain.       No current facility-administered medications for this visit.    Discontinued Meds:  Medications Discontinued During This Encounter  Medication Reason  . ferumoxytol (FERAHEME) 510 MG/17ML SOLN injection Error    Patient Active Problem List   Diagnosis Date Noted  . ASCVD (arteriosclerotic cardiovascular disease) 11/28/2012  . Community acquired pneumonia 11/15/2012  . Acute respiratory failure with hypoxia 11/15/2012  . Hypokalemia 11/15/2012  . Chronic GI bleeding 11/15/2012  . COPD with acute exacerbation 11/15/2012  . Atypical chest pain 11/15/2012  . Essential hypertension, benign 07/28/2010  . Mixed hyperlipidemia 07/28/2010  . CORONARY ATHEROSCLEROSIS NATIVE CORONARY ARTERY 12/24/2009  . IBS 11/20/2009  . ARTERIOVENOUS MALFORMATION 09/20/2008  . COLONIC POLYPS, ADENOMATOUS 09/13/2008  . DIABETES MELLITUS 09/13/2008  . TOBACCO ABUSE 09/13/2008  . HEMORRHOIDS, INTERNAL 09/13/2008  . EOSINOPHILIC ESOPHAGITIS [id number]  . FATTY LIVER DISEASE 09/13/2008  . ANEMIA-IRON DEFICIENCY 05/25/2008  . GERD 05/25/2008  . CONSTIPATION 05/25/2008  . DIARRHEA 05/25/2008    LABS    Component Value Date/Time   NA 140 11/17/2012 0514   NA 140 11/16/2012 0411   NA 140 11/15/2012 1308   K 3.9 11/17/2012 0514   K 4.0 11/16/2012 0411    K 3.0* 11/15/2012 1308   CL 105 11/17/2012 0514   CL 103 11/16/2012 0411   CL 103 11/15/2012 1308   CO2 26 11/17/2012 0514   CO2 27 11/16/2012 0411   CO2 26 11/15/2012 1308   GLUCOSE 138* 11/17/2012 0514   GLUCOSE 150* 11/16/2012 0411   GLUCOSE 138* 11/15/2012 1308   BUN 16 11/17/2012 0514   BUN 12 11/16/2012 0411   BUN 13 11/15/2012 1308   CREATININE 0.94 11/17/2012 0514   CREATININE 0.84 11/16/2012 0411   CREATININE 0.86 11/15/2012 1308   CREATININE 1.09 03/17/2012 1555   CREATININE 1.00 01/19/2011 1416   CREATININE 0.94 01/27/2010   CALCIUM 9.2 11/17/2012 0514   CALCIUM 8.9 11/16/2012 0411   CALCIUM 9.3 11/15/2012 1308   GFRNONAA 83* 11/17/2012 0514   GFRNONAA 87* 11/16/2012 0411   GFRNONAA 86* 11/15/2012 1308   GFRAA >90 11/17/2012 0514   GFRAA >90 11/16/2012 0411   GFRAA >90 11/15/2012 1308   CMP     Component Value Date/Time   NA 140 11/17/2012 0514   K 3.9 11/17/2012 0514   CL 105 11/17/2012 0514   CO2 26 11/17/2012 0514   GLUCOSE 138* 11/17/2012 0514   BUN 16 11/17/2012 0514   CREATININE 0.94 11/17/2012 0514   CREATININE 1.09 03/17/2012 1555   CALCIUM 9.2 11/17/2012 0514   PROT 7.2 11/16/2012 0411   ALBUMIN 3.3* 11/16/2012 0411   AST 14 11/16/2012 0411   AST 12 01/27/2010   ALT 7 11/16/2012 0411   ALKPHOS 107 11/16/2012 0411   ALKPHOS 96 01/27/2010   BILITOT 0.7 11/16/2012 0411   GFRNONAA 83* 11/17/2012 0514   GFRAA >90 11/17/2012 0514       Component Value Date/Time   WBC 5.0 05/01/2013 1150   WBC 15.4* 11/17/2012 0514   WBC 6.3 11/16/2012 0411   HGB 9.4* 05/01/2013 1150   HGB 8.4* 11/17/2012 0514   HGB 8.5* 11/16/2012 0411   HCT 30.6* 05/01/2013 1150   HCT 28.4* 11/17/2012 0514   HCT 28.3* 11/16/2012 0411   MCV 81.8 05/01/2013 1150   MCV 85.8 11/17/2012 0514   MCV 84.0 11/16/2012 0411    Lipid Panel     Component Value Date/Time   CHOL 120 01/07/2011 1050   TRIG 167* 01/07/2011 1050   HDL 37* 01/07/2011 1050   CHOLHDL 3.2 01/07/2011 1050   VLDL  33 01/07/2011 1050   LDLCALC 50 01/07/2011  1050    ABG No results found for this basename: phart, pco2, pco2art, po2, po2art, hco3, tco2, acidbasedef, o2sat     Lab Results  Component Value Date   TSH 3.423 11/15/2012   BNP (last 3 results)  Recent Labs  11/15/12 1308  PROBNP 2764.0*   Cardiac Panel (last 3 results) No results found for this basename: CKTOTAL, CKMB, TROPONINI, RELINDX,  in the last 72 hours  Iron/TIBC/Ferritin    Component Value Date/Time   IRON 31* 05/01/2013 1150   TIBC 472* 05/01/2013 1150   FERRITIN 11* 05/01/2013 1150     EKG Orders placed in visit on 11/28/12  . EKG 12-LEAD     Prior Assessment and Plan Problem List as of 06/02/2013     Cardiovascular and Mediastinum   CORONARY ATHEROSCLEROSIS NATIVE CORONARY ARTERY   Last Assessment & Plan   04/05/2012 Office Visit Written 04/05/2012 12:03 PM by Lendon Colonel, NP     Review of cardiac cath results discussed with the patient. He has collateral flow noted around occlusion of the SVG and Ramus. Will continue to treat him with medical therapy to include ongoing use of ASA, plavix, statin, imdur, and coreg. He is advised to stop smoking completely. As cath report is reassuring, I recommend follow up with PCP or even pulmonologist for ongoing evaluation of lung disease causing his symptoms. The patient verbalizes understanding is will to continue risk management and discuss other options.     HEMORRHOIDS, INTERNAL   ARTERIOVENOUS MALFORMATION   Last Assessment & Plan   03/17/2012 Office Visit Written 03/17/2012  4:30 PM by Lendon Colonel, NP     Followed by Dr. Barney Drain in [place]. No complaints of abdominal pain or bleeding.     Essential hypertension, benign   Last Assessment & Plan   04/05/2012 Office Visit Written 04/05/2012 12:03 PM by Lendon Colonel, NP     Excellent control of BP presently. Even on the low normal side. He denies dizziness or near syncope. No changes at this time.    ASCVD (arteriosclerotic cardiovascular  disease)   Last Assessment & Plan   11/28/2012 Office Visit Written 11/28/2012  2:26 PM by Lendon Colonel, NP     EKG is abnormal but medical management is to continue. He is without complaints of angina. He has some chest wall pain from frequent coughing from pneumonia. Cath 8 months ago did not recommend revascularization or intervention. He will continue on current medication regimen with risk factor management.      Respiratory   Community acquired pneumonia   Last Assessment & Plan   11/28/2012 Office Visit Written 11/28/2012  2:26 PM by Lendon Colonel, NP     Continues to recover from this.  Still having some coughing episodes. To see PCP for follow up.    Acute respiratory failure with hypoxia   COPD with acute exacerbation     Digestive   COLONIC POLYPS, ADENOMATOUS   EOSINOPHILIC ESOPHAGITIS   GERD   Last Assessment & Plan   01/19/2011 Office Visit Written 01/20/2011  1:30 PM by Andria Meuse, NP     Controlled on PPI     CONSTIPATION   IBS   FATTY LIVER DISEASE   Chronic GI bleeding     Endocrine   DIABETES MELLITUS     Other   ANEMIA-IRON DEFICIENCY   Last Assessment & Plan   03/17/2012 Office Visit Written  03/17/2012  4:29 PM by Lendon Colonel, NP     Most recent labs completed on 03/02/2012 Hgb 12.7. Hct 38.7. Platelets 174. Followed by Dr. Lexine Baton.     TOBACCO ABUSE   Last Assessment & Plan   11/28/2012 Office Visit Written 11/28/2012  2:27 PM by Lendon Colonel, NP     Unfortunately continues to smoke. Cessation is recommended.    DIARRHEA   Last Assessment & Plan   03/05/2011 Office Visit Written 03/05/2011 10:29 AM by Danie Binder, MD     MOST LIKELY 2O TO DIABETIC ENTEROPATHY & IBS. Sx improved with imodium and align.  CONTINUE IMODIUM. CONTINUE ALIGN OR [employer] PROBIOTIC EVERY DAY. FOLLOW UP IN 4 MOS.    Mixed hyperlipidemia   Last Assessment & Plan   07/08/2011 Office Visit Written 07/08/2011  1:32 PM by Satira Sark, MD      Continues on statin therapy.    Hypokalemia   Atypical chest pain       Imaging: No results found.

## 2013-06-02 NOTE — Progress Notes (Signed)
HPI: Donald Gaines is a 73 year old medically complicated patient of Dr. Domenic Polite we are seeing for ongoing assessment and management of CAD, history of CABG, bare-metal stent to the SVG to RCA in 2000, 2 more subsequent stents to unknown arteries, completed by Dr. Truett Mainland in [place], carotid artery disease, hypertension, hyperlipidemia, history of AV malformation (followed by Dr. Oneida Alar in [place]) on Plavix only, diabetes, and other medical issues.    He was last seen in the office in September of 2014. He was without complaint at that time. Most recent echocardiogram completed during hospitalization in August of 2014 demonstrated overall EF low normal range with apical hypokinesis. This continued on medical management, smoking cessation was recommended.     He comes today with new diagnosis of Alzheimer's. He has had cataract surgery. He has had two episode of chest pain requiring NTG. He is cared for by his sister, as he and his wife have separated She provides his medications and helps with overall care. He unfortunately continues to smoke.   Allergies  Allergen Reactions  . Penicillins Rash    Current Outpatient Prescriptions  Medication Sig Dispense Refill  . ADVAIR DISKUS 250-50 MCG/DOSE AEPB Inhale 1 puff into the lungs 2 (two) times daily.       Marland Kitchen albuterol (PROVENTIL HFA;VENTOLIN HFA) 108 (90 BASE) MCG/ACT inhaler Inhale 2 puffs into the lungs every 6 (six) hours as needed for wheezing.      Marland Kitchen ALPRAZolam (XANAX) 0.5 MG tablet Take 0.5 mg by mouth 2 (two) times daily as needed for anxiety.       . AVODART 0.5 MG capsule Take 0.5 mg by mouth daily.       . Carboxymethylcellul-Glycerin (REFRESH OPTIVE OP) Place 1 drop into the right eye 3 (three) times daily. For both eyes      . carvedilol (COREG) 6.25 MG tablet Take 6.25 mg by mouth 2 (two) times daily with a meal.       . cetirizine (ZYRTEC) 10 MG tablet Take 10 mg by mouth daily.      . clopidogrel (PLAVIX) 75 MG  tablet Take 75 mg by mouth daily.      Marland Kitchen FLUoxetine (PROZAC) 20 MG capsule Take 20 mg by mouth 2 (two) times daily.      . furosemide (LASIX) 40 MG tablet Take 40 mg by mouth daily.       . isosorbide mononitrate (IMDUR) 60 MG 24 hr tablet Take 1 tablet (60 mg total) by mouth daily.  30 tablet  11  . losartan (COZAAR) 50 MG tablet Take 50 mg by mouth daily.       . mirabegron ER (MYRBETRIQ) 25 MG TB24 tablet Take 25 mg by mouth daily.      Marland Kitchen NITROSTAT 0.4 MG SL tablet Place 0.4 mg under the tongue every 5 (five) minutes as needed for chest pain.       Marland Kitchen omeprazole (PRILOSEC) 20 MG capsule Take 1 capsule (20 mg total) by mouth daily.  30 capsule  5  . rosuvastatin (CRESTOR) 20 MG tablet TAKE 1 TABLET BY MOUTH EVERY DAY  30 tablet  5  . sitaGLIPtan-metformin (JANUMET) 50-1000 MG per tablet Take 1 tablet by mouth 2 (two) times daily with a meal.        . Tamsulosin HCl (FLOMAX) 0.4 MG CAPS Take 0.4 mg by mouth daily. Takes in AM      . traMADol (ULTRAM) 50 MG tablet Take 50 mg by mouth every  8 (eight) hours as needed for pain.       No current facility-administered medications for this visit.    Past Medical History  Diagnosis Date  . TIA (transient ischemic attack)     2005  . GERD (gastroesophageal reflux disease)   . Hyperlipidemia   . Hypertension   . Depression   . Diabetes mellitus, type 2   . Restrictive lung disease   . Candida esophagitis   . Hemorrhoids   . Arteriovenous malformation small bowel     Capsule study 3/10  . GI bleeding     Cecal ulcers, arteriovenous malformations  . Chronic diarrhea SEP 2011    ?ETIOLOGY-DIABETIC ENTEROPATHY, LACTOSE INTOLERANCE,  OR IBS-MIXED  . Bloating AUG 2009    NL HBT FOR SIBO  . Anemia FEB 2008 FEDA 2o to AVMs, & large colon ulcers    HB 10.7 MCV 74.3; Dr Tressie Stalker  . BPH (benign prostatic hypertrophy)     Dr [place] Pink  . Glaucoma   . Cataract   . ASCVD (arteriosclerotic cardiovascular disease)     LIMA to LAD and LIMA to  intermediate branch, SVG to PD and second OM. 06/03/93  . Bilateral carotid artery stenosis     50-75% external left carotid stenosis  . Coronary atherosclerosis of native coronary artery     Multivessel, BMS SVG TO RCA 2000, reportedly  "2" additional stents in interim  . Myocardial infarction     1994  . Stroke     mini- strokes in past.States has weakness and numbness of right leg. Short term memory loss  . COPD (chronic obstructive pulmonary disease)   . Sleep apnea   . Portal hypertensive gastropathy 11/08/2012    EGD. Dr. Laural Golden  . AVM (arteriovenous malformation) of colon 10/2012    Cecum & cecal polyps  . External hemorrhoids 10/2012    Past Surgical History  Procedure Laterality Date  . Esophagogastroduodenoscopy  SEP 09    DUODENAL LIPOMA  . Capsule endo  03/10  . Right inguinal hernia repair    . Hydrogen breath test  2009  . Colonoscopy  APR 2008    SIMPLE ADENOMA, [place] TICS, IH  . Colonoscopy  FEB 2008 ANEMIA. [facility]     2 LARGE ILEOCEAL ULCERS 2o to ASA, [place] TICS, IH  . Colonoscopy  SEP 2009 TRANSFUSION DEP ANEMIA    AC AVM-ABLATED, [place] TICS, IH  . Upper gastrointestinal endoscopy  SEP 2009    GASTRIC AVM ABLATED, NL DUO Bx  . Cardiac catherization  04/2012  . Cardiac catheterization  04/05/1996 [facility]    EF 45%, occluded SVG to circumflex, patent SVG to D1 and PDA and patent LIMA to LAD with severe native vessel disease.  . Cardiac catheterization  08/22/1998 [facility]    Mild LM, severe LAD,severe CX, occlude RCA, patient  SVG to diatal RCA, patent vein graft to D1 and patent LIMA to LAD.   Marland Kitchen Coronary angioplasty with stent placement  08/22/1998 [name]    TEC stenting of the SVG to RCA-Last seen by cardiologist in 2010.  Marland Kitchen Coronary artery bypass graft  1995-triple bypass    LIMA-LAD, LIMA to intermediate branch, SVG to PD and second OM,  . Cataract extraction w/phaco Right 09/22/2012    Procedure: CATARACT EXTRACTION PHACO AND INTRAOCULAR LENS PLACEMENT  (IOC);  Surgeon: Tonny Branch, MD;  Location: [facility];  Service: Ophthalmology;  Laterality: Right;  CDE: 11.43  . Cataract extraction w/phaco Left 10/17/2012  Procedure: CATARACT EXTRACTION PHACO AND INTRAOCULAR LENS PLACEMENT (IOC);  Surgeon: Tonny Branch, MD;  Location: [facility];  Service: Ophthalmology;  Laterality: Left;  CDE: 8.06  . Colonoscopy with esophagogastroduodenoscopy (egd) N/A 11/08/2012    Procedure: COLONOSCOPY WITH ESOPHAGOGASTRODUODENOSCOPY (EGD);  Surgeon: Rogene Houston, MD;  Location: [facility];  Service: Endoscopy;  Laterality: N/A;  250-rescheduled to 7:30 Ann to notify pt  . [place]ivens capsule study N/A 11/24/2012    Procedure: GIVENS CAPSULE STUDY;  Surgeon: Rogene Houston, MD;  Location: [facility];  Service: Endoscopy;  Laterality: N/A;  730    ROS: .Review of systems complete and found to be negative unless listed above  PHYSICAL EXAM BP 140/58  Pulse 77  Ht 5\' 5"  (1.651 m)  Wt 176 lb (79.833 kg)  BMI 29.29 kg/m2  General: Well developed, well nourished, in no acute distress Head: Eyes PERRLA, No xanthomas.   Normal cephalic and atramatic  Lungs:  He has coarse breath sounds, no wheezing with prolonged respiratory phase.  Heart: HRRR S1 S2, without MRG.  Pulses are 2+ & equal.            No carotid bruit. No JVD.  No abdominal bruits. No femoral bruits. Abdomen: Bowel sounds are positive, abdomen soft and non-tender without masses or                  Hernia's noted. Msk:  Back normal, normal gait. Normal strength and tone for age. Extremities: No clubbing, cyanosis or edema.  DP +1 Neuro: Alert and oriented X 3. Psych:  Good affect, responds appropriately  EKG:  Sinus rhythm with with ST & T wave abnormality with PVC. Unchanged from Sept of 2014.   ASSESSMENT AND PLAN

## 2013-06-02 NOTE — Patient Instructions (Signed)
Your physician wants you to follow-up in: 6 months with Dr.McDowell You will receive a reminder letter in the mail two months in advance. If you don't receive a letter, please call our office to schedule the follow-up appointment.   Your physician recommends that you continue on your current medications as directed. Please refer to the Current Medication list given to you today.

## 2013-06-02 NOTE — Assessment & Plan Note (Signed)
He has no plans to quit smoking. I talked with him about the dangers of smoking with his diagnosis of Alzheimer's. He verbalizes understanding. I have also discussed with him the cardiovascular risk of ongoing tobacco abuse. He states that he really does not plan on quitting smoking, but we will be careful when he does, concerning a fire hazard. He states his sister looks after him.

## 2013-06-05 NOTE — Addendum Note (Signed)
Addended by: Karlyne Greenspan C on: 06/05/2013 11:07 AM   Modules accepted: Orders

## 2013-06-14 ENCOUNTER — Other Ambulatory Visit (INDEPENDENT_AMBULATORY_CARE_PROVIDER_SITE_OTHER): Payer: Self-pay | Admitting: *Deleted

## 2013-06-14 ENCOUNTER — Encounter (INDEPENDENT_AMBULATORY_CARE_PROVIDER_SITE_OTHER): Payer: Self-pay | Admitting: *Deleted

## 2013-06-14 DIAGNOSIS — D509 Iron deficiency anemia, unspecified: Secondary | ICD-10-CM

## 2013-07-13 ENCOUNTER — Encounter (HOSPITAL_COMMUNITY): Payer: Self-pay | Admitting: Emergency Medicine

## 2013-07-13 ENCOUNTER — Emergency Department (HOSPITAL_COMMUNITY)
Admission: EM | Admit: 2013-07-13 | Discharge: 2013-07-13 | Disposition: A | Discharge status: Home / Self Care | Payer: Medicare HMO | Attending: Emergency Medicine | Admitting: Emergency Medicine

## 2013-07-13 ENCOUNTER — Emergency Department (HOSPITAL_COMMUNITY): Payer: Medicare HMO

## 2013-07-13 DIAGNOSIS — Z862 Personal history of diseases of the blood and blood-forming organs and certain disorders involving the immune mechanism: Secondary | ICD-10-CM | POA: Insufficient documentation

## 2013-07-13 DIAGNOSIS — K219 Gastro-esophageal reflux disease without esophagitis: Secondary | ICD-10-CM | POA: Insufficient documentation

## 2013-07-13 DIAGNOSIS — I252 Old myocardial infarction: Secondary | ICD-10-CM | POA: Insufficient documentation

## 2013-07-13 DIAGNOSIS — Z79899 Other long term (current) drug therapy: Secondary | ICD-10-CM | POA: Insufficient documentation

## 2013-07-13 DIAGNOSIS — Z8619 Personal history of other infectious and parasitic diseases: Secondary | ICD-10-CM | POA: Insufficient documentation

## 2013-07-13 DIAGNOSIS — Z9889 Other specified postprocedural states: Secondary | ICD-10-CM | POA: Insufficient documentation

## 2013-07-13 DIAGNOSIS — M549 Dorsalgia, unspecified: Secondary | ICD-10-CM

## 2013-07-13 DIAGNOSIS — Z7902 Long term (current) use of antithrombotics/antiplatelets: Secondary | ICD-10-CM | POA: Insufficient documentation

## 2013-07-13 DIAGNOSIS — Z87448 Personal history of other diseases of urinary system: Secondary | ICD-10-CM | POA: Insufficient documentation

## 2013-07-13 DIAGNOSIS — IMO0002 Reserved for concepts with insufficient information to code with codable children: Secondary | ICD-10-CM | POA: Insufficient documentation

## 2013-07-13 DIAGNOSIS — Z880 Allergy status to penicillin: Secondary | ICD-10-CM | POA: Insufficient documentation

## 2013-07-13 DIAGNOSIS — X500XXA Overexertion from strenuous movement or load, initial encounter: Secondary | ICD-10-CM | POA: Insufficient documentation

## 2013-07-13 DIAGNOSIS — Z8673 Personal history of transient ischemic attack (TIA), and cerebral infarction without residual deficits: Secondary | ICD-10-CM | POA: Insufficient documentation

## 2013-07-13 DIAGNOSIS — E119 Type 2 diabetes mellitus without complications: Secondary | ICD-10-CM | POA: Insufficient documentation

## 2013-07-13 DIAGNOSIS — Y929 Unspecified place or not applicable: Secondary | ICD-10-CM | POA: Insufficient documentation

## 2013-07-13 DIAGNOSIS — I10 Essential (primary) hypertension: Secondary | ICD-10-CM | POA: Insufficient documentation

## 2013-07-13 DIAGNOSIS — J4489 Other specified chronic obstructive pulmonary disease: Secondary | ICD-10-CM | POA: Insufficient documentation

## 2013-07-13 DIAGNOSIS — Z9861 Coronary angioplasty status: Secondary | ICD-10-CM | POA: Insufficient documentation

## 2013-07-13 DIAGNOSIS — Z951 Presence of aortocoronary bypass graft: Secondary | ICD-10-CM | POA: Insufficient documentation

## 2013-07-13 DIAGNOSIS — Y9389 Activity, other specified: Secondary | ICD-10-CM | POA: Insufficient documentation

## 2013-07-13 DIAGNOSIS — I2510 Atherosclerotic heart disease of native coronary artery without angina pectoris: Secondary | ICD-10-CM | POA: Insufficient documentation

## 2013-07-13 DIAGNOSIS — J449 Chronic obstructive pulmonary disease, unspecified: Secondary | ICD-10-CM | POA: Insufficient documentation

## 2013-07-13 DIAGNOSIS — F329 Major depressive disorder, single episode, unspecified: Secondary | ICD-10-CM | POA: Insufficient documentation

## 2013-07-13 DIAGNOSIS — F17200 Nicotine dependence, unspecified, uncomplicated: Secondary | ICD-10-CM | POA: Insufficient documentation

## 2013-07-13 DIAGNOSIS — F3289 Other specified depressive episodes: Secondary | ICD-10-CM | POA: Insufficient documentation

## 2013-07-13 DIAGNOSIS — Z8669 Personal history of other diseases of the nervous system and sense organs: Secondary | ICD-10-CM | POA: Insufficient documentation

## 2013-07-13 DIAGNOSIS — E785 Hyperlipidemia, unspecified: Secondary | ICD-10-CM | POA: Insufficient documentation

## 2013-07-13 MED ORDER — HYDROMORPHONE HCL PF 1 MG/ML IJ SOLN
1.0000 mg | Freq: Once | INTRAMUSCULAR | Status: AC
  Administered 2013-07-13: 1 mg via INTRAVENOUS
  Filled 2013-07-13: qty 1

## 2013-07-13 MED ORDER — PREDNISONE 10 MG PO TABS: 20.0000 mg | ORAL_TABLET | Freq: Every day | ORAL | Status: DC

## 2013-07-13 MED ORDER — ONDANSETRON HCL 4 MG/2ML IJ SOLN
4.0000 mg | Freq: Once | INTRAMUSCULAR | Status: AC
  Administered 2013-07-13: 4 mg via INTRAVENOUS
  Filled 2013-07-13: qty 2

## 2013-07-13 MED ORDER — OXYCODONE-ACETAMINOPHEN 5-325 MG PO TABS: 1.0000 | ORAL_TABLET | Freq: Four times a day (QID) | ORAL | Status: DC | PRN

## 2013-07-13 NOTE — ED Notes (Signed)
C/o lower back pain radiating down both legs.  States it started the other night when he turned over in bed he felt a pop in his lower back.

## 2013-07-13 NOTE — ED Provider Notes (Signed)
CSN: [id number]     Arrival date & time 07/13/13  1519 History  This chart was scribed for Maudry Diego, MD by Zettie Pho, ED Scribe. This patient was seen in room APA08/APA08 and the patient's care was started at 6:04 PM.    Chief Complaint  Patient presents with  . Back Pain   Patient is a 73 y.o. male presenting with back pain. The history is provided by the patient. No language interpreter was used.  Back Pain Location:  Lumbar spine Radiates to:  L thigh, L knee, R knee, R thigh, L foot and R foot Pain severity:  Moderate Onset quality:  Sudden Timing:  Constant Progression:  Unchanged Chronicity:  New Context: twisting   Relieved by:  Bed rest and being still Worsened by:  Movement Ineffective treatments:  None tried Associated symptoms: no abdominal pain, no chest pain and no headaches   Risk factors: vascular disease    HPI Comments: Donald Gaines is a 73 y.o. male who presents to the Emergency Department complaining of a constant pain to the lower back that radiates down the bilateral legs onset a few days ago. Patient reports that the pain presented after he turned over while laying in bed and felt a pop in the area. He states that the pain is exacerbated with movement, but alleviated with rest/being still. Patient has allergies to penicillins. Patient has a history of TIA, stroke, GERD, hyperlipidemia, HTN, type II DM, anemia, ASCVD, bilateral carotid artery stenosis, coronary atherosclerosis of native coronary artery, MI, COPD, portal hypertensive gastropathy.   Past Medical History  Diagnosis Date  . TIA (transient ischemic attack)     2005  . GERD (gastroesophageal reflux disease)   . Hyperlipidemia   . Hypertension   . Depression   . Diabetes mellitus, type 2   . Restrictive lung disease   . Candida esophagitis   . Hemorrhoids   . Arteriovenous malformation small bowel     Capsule study 3/10  . GI bleeding     Cecal ulcers, arteriovenous malformations  .  Chronic diarrhea SEP 2011    ?ETIOLOGY-DIABETIC ENTEROPATHY, LACTOSE INTOLERANCE,  OR IBS-MIXED  . Bloating AUG 2009    NL HBT FOR SIBO  . Anemia FEB 2008 FEDA 2o to AVMs, & large colon ulcers    HB 10.7 MCV 74.3; Dr Tressie Stalker  . BPH (benign prostatic hypertrophy)     Dr [place] Pink  . Glaucoma   . Cataract   . ASCVD (arteriosclerotic cardiovascular disease)     LIMA to LAD and LIMA to intermediate branch, SVG to PD and second OM. 06/03/93  . Bilateral carotid artery stenosis     50-75% external left carotid stenosis  . Coronary atherosclerosis of native coronary artery     Multivessel, BMS SVG TO RCA 2000, reportedly  "2" additional stents in interim  . Myocardial infarction     1994  . Stroke     mini- strokes in past.States has weakness and numbness of right leg. Short term memory loss  . COPD (chronic obstructive pulmonary disease)   . Sleep apnea   . Portal hypertensive gastropathy 11/08/2012    EGD. Dr. Laural Golden  . AVM (arteriovenous malformation) of colon 10/2012    Cecum & cecal polyps  . External hemorrhoids 10/2012   Past Surgical History  Procedure Laterality Date  . Esophagogastroduodenoscopy  SEP 09    DUODENAL LIPOMA  . Capsule endo  03/10  . Right inguinal hernia repair    .  Hydrogen breath test  2009  . Colonoscopy  APR 2008    SIMPLE ADENOMA, [place] TICS, IH  . Colonoscopy  FEB 2008 ANEMIA. [facility]     2 LARGE ILEOCEAL ULCERS 2o to ASA,  TICS, IH  . Colonoscopy  SEP 2009 TRANSFUSION DEP ANEMIA    AC AVM-ABLATED, [place] TICS, IH  . Upper gastrointestinal endoscopy  SEP 2009    GASTRIC AVM ABLATED, NL DUO Bx  . Cardiac catherization  04/2012  . Cardiac catheterization  04/05/1996 [facility]    EF 45%, occluded SVG to circumflex, patent SVG to D1 and PDA and patent LIMA to LAD with severe native vessel disease.  . Cardiac catheterization  08/22/1998 [facility]    Mild LM, severe LAD,severe CX, occlude RCA, patient  SVG to diatal RCA, patent vein graft to D1 and patent  LIMA to LAD.   Marland Kitchen Coronary angioplasty with stent placement  08/22/1998 [name]    TEC stenting of the SVG to RCA-Last seen by cardiologist in 2010.  Marland Kitchen Coronary artery bypass graft  1995-triple bypass    LIMA-LAD, LIMA to intermediate branch, SVG to PD and second OM,  . Cataract extraction w/phaco Right 09/22/2012    Procedure: CATARACT EXTRACTION PHACO AND INTRAOCULAR LENS PLACEMENT (IOC);  Surgeon: Tonny Branch, MD;  Location: [facility];  Service: Ophthalmology;  Laterality: Right;  CDE: 11.43  . Cataract extraction w/phaco Left 10/17/2012    Procedure: CATARACT EXTRACTION PHACO AND INTRAOCULAR LENS PLACEMENT (IOC);  Surgeon: Tonny Branch, MD;  Location: [facility];  Service: Ophthalmology;  Laterality: Left;  CDE: 8.06  . Colonoscopy with esophagogastroduodenoscopy (egd) N/A 11/08/2012    Procedure: COLONOSCOPY WITH ESOPHAGOGASTRODUODENOSCOPY (EGD);  Surgeon: Rogene Houston, MD;  Location: [facility] SUITE;  Service: Endoscopy;  Laterality: N/A;  250-rescheduled to 7:30 Ann to notify pt  . Givens capsule study N/A 11/24/2012    Procedure: GIVENS CAPSULE STUDY;  Surgeon: Rogene Houston, MD;  Location: [facility] SU[facility];  Service: Endoscopy;  Laterality: N/A;  730   Family History  Problem Relation Age of Onset  . Heart attack Mother   . Heart disease Mother   . Hypertension Mother    History  Substance Use Topics  . Smoking status: Current Every Day Smoker -- 1.00 packs/day for 60 years    Types: Cigarettes  . Smokeless tobacco: Never Used     Comment: 1 pack a day  . Alcohol Use: No     Comment: No etoh in 12 yrs.    Review of Systems  Constitutional: Negative for appetite change and fatigue.  HENT: Negative for congestion, ear discharge and sinus pressure.   Eyes: Negative for discharge.  Respiratory: Negative for cough.   Cardiovascular: Negative for chest pain.  Gastrointestinal: Negative for abdominal pain and diarrhea.  Genitourinary: Negative for frequency and hematuria.   Musculoskeletal: Positive for back pain.  Skin: Negative for rash.  Neurological: Negative for seizures and headaches.  Psychiatric/Behavioral: Negative for hallucinations.      Allergies  Penicillins  Home Medications   Prior to Admission medications   Medication Sig Start Date End Date Taking? Authorizing Provider  ADVAIR DISKUS 250-50 MCG/DOSE AEPB Inhale 1 puff into the lungs 2 (two) times daily.  10/02/10   Historical Provider, MD  albuterol (PROVENTIL HFA;VENTOLIN HFA) 108 (90 BASE) MCG/ACT inhaler Inhale 2 puffs into the lungs every 6 (six) hours as needed for wheezing.    Historical Provider, MD  ALPRAZolam Duanne Moron) 0.5 MG tablet Take 0.5  mg by mouth 2 (two) times daily as needed for anxiety.     Historical Provider, MD  AVODART 0.5 MG capsule Take 0.5 mg by mouth daily.  11/21/12   Historical Provider, MD  Carboxymethylcellul-Glycerin (REFRESH OPTIVE OP) Place 1 drop into the right eye 3 (three) times daily. For both eyes    Historical Provider, MD  carvedilol (COREG) 6.25 MG tablet Take 6.25 mg by mouth 2 (two) times daily with a meal.     Historical Provider, MD  cetirizine (ZYRTEC) 10 MG tablet Take 10 mg by mouth daily.    Historical Provider, MD  clopidogrel (PLAVIX) 75 MG tablet Take 75 mg by mouth daily.    Historical Provider, MD  FLUoxetine (PROZAC) 20 MG capsule Take 20 mg by mouth 2 (two) times daily.    Historical Provider, MD  furosemide (LASIX) 40 MG tablet Take 40 mg by mouth daily.     Historical Provider, MD  isosorbide mononitrate (IMDUR) 60 MG 24 hr tablet Take 1 tablet (60 mg total) by mouth daily. 06/01/13   Lendon Colonel, NP  losartan (COZAAR) 50 MG tablet Take 50 mg by mouth daily.  09/23/10   Historical Provider, MD  mirabegron ER (MYRBETRIQ) 25 MG TB24 tablet Take 25 mg by mouth daily.    Historical Provider, MD  NITROSTAT 0.4 MG SL tablet Place 0.4 mg under the tongue every 5 (five) minutes as needed for chest pain.  05/23/10   Historical Provider, MD   omeprazole (PRILOSEC) 20 MG capsule Take 1 capsule (20 mg total) by mouth daily. 05/01/13   Rogene Houston, MD  rosuvastatin (CRESTOR) 20 MG tablet TAKE 1 TABLET BY MOUTH EVERY DAY 05/30/13   Satira Sark, MD  sitaGLIPtan-metformin (JANUMET) 50-1000 MG per tablet Take 1 tablet by mouth 2 (two) times daily with a meal.      Historical Provider, MD  Tamsulosin HCl (FLOMAX) 0.4 MG CAPS Take 0.4 mg by mouth daily. Takes in AM    Historical Provider, MD  traMADol (ULTRAM) 50 MG tablet Take 50 mg by mouth every 8 (eight) hours as needed for pain.    Historical Provider, MD   Triage Vitals: BP 106/54  Pulse 63  Temp(Src) 97.8 F (36.6 C) (Oral)  Resp 16  Ht 5\' 5"  (1.651 m)  Wt 178 lb 8 oz (80.967 kg)  BMI 29.70 kg/m2  SpO2 95%  Physical Exam  Constitutional: He is oriented to person, place, and time. He appears well-developed.  HENT:  Head: Normocephalic.  Eyes: Conjunctivae and EOM are normal. No scleral icterus.  Neck: Neck supple. No thyromegaly present.  Cardiovascular: Normal rate and regular rhythm.  Exam reveals no gallop and no friction rub.   No murmur heard. Pulmonary/Chest: No stridor. He has no wheezes. He has no rales. He exhibits no tenderness.  Abdominal: He exhibits no distension. There is no tenderness. There is no rebound.  Musculoskeletal: Normal range of motion. He exhibits no edema.  Moderate lumbar tenderness. Pain with bilateral straight leg raise.   Lymphadenopathy:    He has no cervical adenopathy.  Neurological: He is oriented to person, place, and time. He exhibits normal muscle tone. Coordination normal.  Skin: No rash noted. No erythema.  Psychiatric: He has a normal mood and affect. His behavior is normal.    ED Course  Procedures (including critical care time)  DIAGNOSTIC STUDIES: Oxygen Saturation is 95% on room air, adequate by my interpretation.    COORDINATION OF CARE: 6:07 PM-  Ordered an x-ray of the L spine. Ordered Dilaudid and Zofran to  manage symptoms. Discussed treatment plan with patient at bedside and patient verbalized agreement.     Labs Review Labs Reviewed - No data to display  Imaging Review Dg Lumbar Spine Complete  07/13/2013   CLINICAL DATA:  Low back pain  EXAM: LUMBAR SPINE - COMPLETE 4+ VIEW  COMPARISON:  CT ABDOMEN W/O CM dated 07/22/2008  FINDINGS: Anatomic alignment. Mild narrowing of the L3-4 disc. Mild narrowing at L5-S1. Osteopenia. Vascular calcifications. Degenerative change of the left hip joint. No vertebral compression deformity.  IMPRESSION: No acute bony pathology.  Chronic changes.   Electronically Signed   By: Maryclare Bean M.D.   On: 07/13/2013 19:01     EKG Interpretation None      MDM   Final diagnoses:  None    The chart was scribed for me under my direct supervision.  I personally performed the history, physical, and medical decision making and all procedures in the evaluation of this patient.Maudry Diego, MD 07/13/13 2059

## 2013-07-13 NOTE — Discharge Instructions (Signed)
Follow up with your md next week for recheck °

## 2013-08-29 ENCOUNTER — Ambulatory Visit (INDEPENDENT_AMBULATORY_CARE_PROVIDER_SITE_OTHER): Payer: Medicare HMO | Admitting: Internal Medicine

## 2013-08-29 ENCOUNTER — Telehealth (INDEPENDENT_AMBULATORY_CARE_PROVIDER_SITE_OTHER): Payer: Self-pay | Admitting: *Deleted

## 2013-08-29 NOTE — Telephone Encounter (Signed)
Cassidy No Showed for his apt on 08/29/13 with Dr. Laural Golden.

## 2013-08-29 NOTE — Telephone Encounter (Signed)
Noted  

## 2013-09-01 ENCOUNTER — Encounter (INDEPENDENT_AMBULATORY_CARE_PROVIDER_SITE_OTHER): Payer: Self-pay | Admitting: *Deleted

## 2013-09-24 ENCOUNTER — Inpatient Hospital Stay (HOSPITAL_COMMUNITY)
Admission: EM | Admit: 2013-09-24 | Discharge: 2013-09-27 | DRG: 344 | Disposition: A | Discharge status: Home / Self Care | Payer: Medicare HMO | Attending: Family Medicine | Admitting: Family Medicine

## 2013-09-24 ENCOUNTER — Encounter (HOSPITAL_COMMUNITY): Payer: Self-pay | Admitting: Emergency Medicine

## 2013-09-24 ENCOUNTER — Emergency Department (HOSPITAL_COMMUNITY): Payer: Medicare HMO

## 2013-09-24 DIAGNOSIS — H409 Unspecified glaucoma: Secondary | ICD-10-CM | POA: Diagnosis present

## 2013-09-24 DIAGNOSIS — K319 Disease of stomach and duodenum, unspecified: Secondary | ICD-10-CM | POA: Diagnosis present

## 2013-09-24 DIAGNOSIS — D500 Iron deficiency anemia secondary to blood loss (chronic): Secondary | ICD-10-CM | POA: Diagnosis present

## 2013-09-24 DIAGNOSIS — F0390 Unspecified dementia without behavioral disturbance: Secondary | ICD-10-CM | POA: Diagnosis present

## 2013-09-24 DIAGNOSIS — I252 Old myocardial infarction: Secondary | ICD-10-CM

## 2013-09-24 DIAGNOSIS — Z951 Presence of aortocoronary bypass graft: Secondary | ICD-10-CM

## 2013-09-24 DIAGNOSIS — N400 Enlarged prostate without lower urinary tract symptoms: Secondary | ICD-10-CM | POA: Diagnosis present

## 2013-09-24 DIAGNOSIS — J9601 Acute respiratory failure with hypoxia: Secondary | ICD-10-CM | POA: Diagnosis present

## 2013-09-24 DIAGNOSIS — Z6830 Body mass index (BMI) 30.0-30.9, adult: Secondary | ICD-10-CM

## 2013-09-24 DIAGNOSIS — E876 Hypokalemia: Secondary | ICD-10-CM | POA: Diagnosis present

## 2013-09-24 DIAGNOSIS — Z8673 Personal history of transient ischemic attack (TIA), and cerebral infarction without residual deficits: Secondary | ICD-10-CM

## 2013-09-24 DIAGNOSIS — J449 Chronic obstructive pulmonary disease, unspecified: Secondary | ICD-10-CM | POA: Diagnosis present

## 2013-09-24 DIAGNOSIS — Z8249 Family history of ischemic heart disease and other diseases of the circulatory system: Secondary | ICD-10-CM

## 2013-09-24 DIAGNOSIS — E119 Type 2 diabetes mellitus without complications: Secondary | ICD-10-CM | POA: Diagnosis present

## 2013-09-24 DIAGNOSIS — E785 Hyperlipidemia, unspecified: Secondary | ICD-10-CM | POA: Diagnosis present

## 2013-09-24 DIAGNOSIS — J9600 Acute respiratory failure, unspecified whether with hypoxia or hypercapnia: Secondary | ICD-10-CM | POA: Diagnosis not present

## 2013-09-24 DIAGNOSIS — I2510 Atherosclerotic heart disease of native coronary artery without angina pectoris: Secondary | ICD-10-CM | POA: Diagnosis present

## 2013-09-24 DIAGNOSIS — I5033 Acute on chronic diastolic (congestive) heart failure: Secondary | ICD-10-CM | POA: Diagnosis present

## 2013-09-24 DIAGNOSIS — I509 Heart failure, unspecified: Secondary | ICD-10-CM | POA: Diagnosis present

## 2013-09-24 DIAGNOSIS — D539 Nutritional anemia, unspecified: Secondary | ICD-10-CM | POA: Diagnosis present

## 2013-09-24 DIAGNOSIS — I10 Essential (primary) hypertension: Secondary | ICD-10-CM | POA: Diagnosis present

## 2013-09-24 DIAGNOSIS — K219 Gastro-esophageal reflux disease without esophagitis: Secondary | ICD-10-CM | POA: Diagnosis present

## 2013-09-24 DIAGNOSIS — J4489 Other specified chronic obstructive pulmonary disease: Secondary | ICD-10-CM | POA: Diagnosis present

## 2013-09-24 DIAGNOSIS — E669 Obesity, unspecified: Secondary | ICD-10-CM | POA: Diagnosis present

## 2013-09-24 DIAGNOSIS — Z7902 Long term (current) use of antithrombotics/antiplatelets: Secondary | ICD-10-CM

## 2013-09-24 DIAGNOSIS — Z79899 Other long term (current) drug therapy: Secondary | ICD-10-CM

## 2013-09-24 DIAGNOSIS — K5520 Angiodysplasia of colon without hemorrhage: Secondary | ICD-10-CM | POA: Diagnosis present

## 2013-09-24 DIAGNOSIS — D62 Acute posthemorrhagic anemia: Secondary | ICD-10-CM | POA: Diagnosis present

## 2013-09-24 DIAGNOSIS — G4730 Sleep apnea, unspecified: Secondary | ICD-10-CM | POA: Diagnosis present

## 2013-09-24 DIAGNOSIS — D649 Anemia, unspecified: Secondary | ICD-10-CM

## 2013-09-24 DIAGNOSIS — K922 Gastrointestinal hemorrhage, unspecified: Secondary | ICD-10-CM

## 2013-09-24 DIAGNOSIS — Z9861 Coronary angioplasty status: Secondary | ICD-10-CM

## 2013-09-24 DIAGNOSIS — F17200 Nicotine dependence, unspecified, uncomplicated: Secondary | ICD-10-CM | POA: Diagnosis present

## 2013-09-24 HISTORY — DX: Unspecified dementia, unspecified severity, without behavioral disturbance, psychotic disturbance, mood disturbance, and anxiety: F03.90

## 2013-09-24 LAB — URINALYSIS, ROUTINE W REFLEX MICROSCOPIC
Bilirubin Urine: NEGATIVE
GLUCOSE, UA: NEGATIVE mg/dL
Hgb urine dipstick: NEGATIVE
KETONES UR: NEGATIVE mg/dL
LEUKOCYTES UA: NEGATIVE
Nitrite: NEGATIVE
Specific Gravity, Urine: 1.015 (ref 1.005–1.030)
Urobilinogen, UA: 1 mg/dL (ref 0.0–1.0)
pH: 6 (ref 5.0–8.0)

## 2013-09-24 LAB — CBC WITH DIFFERENTIAL/PLATELET
Basophils Absolute: 0.1 10*3/uL (ref 0.0–0.1)
Basophils Relative: 1 % (ref 0–1)
Eosinophils Absolute: 0.1 10*3/uL (ref 0.0–0.7)
Eosinophils Relative: 2 % (ref 0–5)
HCT: 24.4 % — ABNORMAL LOW (ref 39.0–52.0)
HEMOGLOBIN: 7.1 g/dL — AB (ref 13.0–17.0)
LYMPHS PCT: 18 % (ref 12–46)
Lymphs Abs: 1 10*3/uL (ref 0.7–4.0)
MCH: 21.9 pg — ABNORMAL LOW (ref 26.0–34.0)
MCHC: 29.1 g/dL — ABNORMAL LOW (ref 30.0–36.0)
MCV: 75.3 fL — ABNORMAL LOW (ref 78.0–100.0)
MONO ABS: 0.5 10*3/uL (ref 0.1–1.0)
MONOS PCT: 8 % (ref 3–12)
NEUTROS ABS: 4 10*3/uL (ref 1.7–7.7)
NEUTROS PCT: 71 % (ref 43–77)
Platelets: 228 10*3/uL (ref 150–400)
RBC: 3.24 MIL/uL — ABNORMAL LOW (ref 4.22–5.81)
RDW: 17.2 % — ABNORMAL HIGH (ref 11.5–15.5)
WBC: 5.6 10*3/uL (ref 4.0–10.5)

## 2013-09-24 LAB — COMPREHENSIVE METABOLIC PANEL
ALK PHOS: 102 U/L (ref 39–117)
ALT: 13 U/L (ref 0–53)
AST: 25 U/L (ref 0–37)
Albumin: 3.6 g/dL (ref 3.5–5.2)
Anion gap: 13 (ref 5–15)
BUN: 16 mg/dL (ref 6–23)
CO2: 26 meq/L (ref 19–32)
Calcium: 9.2 mg/dL (ref 8.4–10.5)
Chloride: 103 mEq/L (ref 96–112)
Creatinine, Ser: 0.97 mg/dL (ref 0.50–1.35)
GFR, EST NON AFRICAN AMERICAN: 82 mL/min — AB (ref >90)
GLUCOSE: 106 mg/dL — AB (ref 70–99)
Potassium: 3.2 mEq/L — ABNORMAL LOW (ref 3.7–5.3)
SODIUM: 142 meq/L (ref 137–147)
Total Bilirubin: 0.4 mg/dL (ref 0.3–1.2)
Total Protein: 7.4 g/dL (ref 6.0–8.3)

## 2013-09-24 LAB — PRO B NATRIURETIC PEPTIDE: PRO B NATRI PEPTIDE: 2129 pg/mL — AB (ref 0–125)

## 2013-09-24 LAB — PROTIME-INR
INR: 1.05 (ref 0.00–1.49)
Prothrombin Time: 13.7 seconds (ref 11.6–15.2)

## 2013-09-24 LAB — PREPARE RBC (CROSSMATCH)

## 2013-09-24 LAB — TROPONIN I: Troponin I: <0.3 ng/mL (ref <0.30)

## 2013-09-24 MED ORDER — ONDANSETRON HCL 4 MG PO TABS: 4.0000 mg | ORAL_TABLET | Freq: Four times a day (QID) | ORAL | Status: DC | PRN

## 2013-09-24 MED ORDER — CARVEDILOL 3.125 MG PO TABS
6.2500 mg | ORAL_TABLET | Freq: Two times a day (BID) | ORAL | Status: DC
  Administered 2013-09-25 – 2013-09-27 (×6): 6.25 mg via ORAL
  Filled 2013-09-24 (×6): qty 2

## 2013-09-24 MED ORDER — ALUM & MAG HYDROXIDE-SIMETH 200-200-20 MG/5ML PO SUSP
30.0000 mL | Freq: Four times a day (QID) | ORAL | Status: DC | PRN
  Administered 2013-09-27: 30 mL via ORAL
  Filled 2013-09-24: qty 30

## 2013-09-24 MED ORDER — ADULT MULTIVITAMIN W/MINERALS CH
1.0000 | ORAL_TABLET | Freq: Every day | ORAL | Status: DC
  Administered 2013-09-25 – 2013-09-26 (×2): 1 via ORAL
  Filled 2013-09-24 (×2): qty 1

## 2013-09-24 MED ORDER — TAMSULOSIN HCL 0.4 MG PO CAPS
0.4000 mg | ORAL_CAPSULE | Freq: Every morning | ORAL | Status: DC
  Administered 2013-09-25 – 2013-09-27 (×3): 0.4 mg via ORAL
  Filled 2013-09-24 (×3): qty 1

## 2013-09-24 MED ORDER — DULOXETINE HCL 30 MG PO CPEP
30.0000 mg | ORAL_CAPSULE | Freq: Two times a day (BID) | ORAL | Status: DC
  Administered 2013-09-24 – 2013-09-26 (×5): 30 mg via ORAL
  Filled 2013-09-24 (×5): qty 1

## 2013-09-24 MED ORDER — MORPHINE SULFATE 2 MG/ML IJ SOLN
1.0000 mg | INTRAMUSCULAR | Status: DC | PRN
  Administered 2013-09-25 (×2): 1 mg via INTRAVENOUS
  Filled 2013-09-24 (×2): qty 1

## 2013-09-24 MED ORDER — LOSARTAN POTASSIUM 50 MG PO TABS
50.0000 mg | ORAL_TABLET | Freq: Every morning | ORAL | Status: DC
  Administered 2013-09-25 – 2013-09-27 (×3): 50 mg via ORAL
  Filled 2013-09-24 (×3): qty 1

## 2013-09-24 MED ORDER — MOMETASONE FURO-FORMOTEROL FUM 100-5 MCG/ACT IN AERO
2.0000 | INHALATION_SPRAY | Freq: Two times a day (BID) | RESPIRATORY_TRACT | Status: DC
  Administered 2013-09-24 – 2013-09-27 (×7): 2 via RESPIRATORY_TRACT
  Filled 2013-09-24: qty 8.8

## 2013-09-24 MED ORDER — ISOSORBIDE MONONITRATE ER 60 MG PO TB24
60.0000 mg | ORAL_TABLET | Freq: Every day | ORAL | Status: DC
  Administered 2013-09-25 – 2013-09-27 (×3): 60 mg via ORAL
  Filled 2013-09-24 (×3): qty 1

## 2013-09-24 MED ORDER — ONDANSETRON HCL 4 MG/2ML IJ SOLN
4.0000 mg | Freq: Four times a day (QID) | INTRAMUSCULAR | Status: DC | PRN
Start: 2013-09-24 — End: 2013-09-27

## 2013-09-24 MED ORDER — POTASSIUM CHLORIDE CRYS ER 20 MEQ PO TBCR
40.0000 meq | EXTENDED_RELEASE_TABLET | Freq: Once | ORAL | Status: AC
  Administered 2013-09-24: 40 meq via ORAL
  Filled 2013-09-24: qty 2

## 2013-09-24 MED ORDER — VITAMIN B-1 100 MG PO TABS
100.0000 mg | ORAL_TABLET | Freq: Every day | ORAL | Status: DC
  Administered 2013-09-25 – 2013-09-27 (×3): 100 mg via ORAL
  Filled 2013-09-24 (×3): qty 1

## 2013-09-24 MED ORDER — ALBUTEROL SULFATE (2.5 MG/3ML) 0.083% IN NEBU: 2.5000 mg | INHALATION_SOLUTION | Freq: Four times a day (QID) | RESPIRATORY_TRACT | Status: DC | PRN

## 2013-09-24 MED ORDER — CYCLOSPORINE 0.05 % OP EMUL
1.0000 [drp] | Freq: Two times a day (BID) | OPHTHALMIC | Status: DC
  Administered 2013-09-25 – 2013-09-27 (×5): 1 [drp] via OPHTHALMIC
  Filled 2013-09-24 (×10): qty 1

## 2013-09-24 MED ORDER — DUTASTERIDE 0.5 MG PO CAPS
0.5000 mg | ORAL_CAPSULE | Freq: Every morning | ORAL | Status: DC
  Administered 2013-09-25 – 2013-09-26 (×2): 0.5 mg via ORAL
  Filled 2013-09-24 (×5): qty 1

## 2013-09-24 MED ORDER — OXYCODONE-ACETAMINOPHEN 5-325 MG PO TABS
1.0000 | ORAL_TABLET | Freq: Four times a day (QID) | ORAL | Status: DC | PRN
  Administered 2013-09-25 – 2013-09-27 (×3): 1 via ORAL
  Filled 2013-09-24 (×3): qty 1

## 2013-09-24 MED ORDER — LINAGLIPTIN 5 MG PO TABS
5.0000 mg | ORAL_TABLET | Freq: Every day | ORAL | Status: DC
  Administered 2013-09-25 – 2013-09-27 (×3): 5 mg via ORAL
  Filled 2013-09-24 (×3): qty 1

## 2013-09-24 MED ORDER — NITROGLYCERIN 0.4 MG SL SUBL
0.4000 mg | SUBLINGUAL_TABLET | SUBLINGUAL | Status: DC | PRN
  Administered 2013-09-27: 0.4 mg via SUBLINGUAL
  Filled 2013-09-24: qty 1

## 2013-09-24 MED ORDER — ALPRAZOLAM 0.5 MG PO TABS
0.5000 mg | ORAL_TABLET | Freq: Two times a day (BID) | ORAL | Status: DC | PRN
  Administered 2013-09-24 – 2013-09-25 (×2): 0.5 mg via ORAL
  Filled 2013-09-24 (×2): qty 1

## 2013-09-24 MED ORDER — METFORMIN HCL 500 MG PO TABS
1000.0000 mg | ORAL_TABLET | Freq: Every day | ORAL | Status: DC
  Administered 2013-09-25 – 2013-09-27 (×3): 1000 mg via ORAL
  Filled 2013-09-24 (×3): qty 2

## 2013-09-24 MED ORDER — SODIUM CHLORIDE 0.9 % IJ SOLN
3.0000 mL | Freq: Two times a day (BID) | INTRAMUSCULAR | Status: DC
  Administered 2013-09-25 – 2013-09-27 (×4): 3 mL via INTRAVENOUS

## 2013-09-24 MED ORDER — PANTOPRAZOLE SODIUM 40 MG PO TBEC
40.0000 mg | DELAYED_RELEASE_TABLET | Freq: Every day | ORAL | Status: DC
  Administered 2013-09-25 – 2013-09-27 (×3): 40 mg via ORAL
  Filled 2013-09-24 (×3): qty 1

## 2013-09-24 MED ORDER — FOLIC ACID 1 MG PO TABS
1.0000 mg | ORAL_TABLET | Freq: Every day | ORAL | Status: DC
  Administered 2013-09-25 – 2013-09-27 (×3): 1 mg via ORAL
  Filled 2013-09-24 (×3): qty 1

## 2013-09-24 MED ORDER — MIRABEGRON ER 25 MG PO TB24
25.0000 mg | ORAL_TABLET | Freq: Every day | ORAL | Status: DC
  Administered 2013-09-25 – 2013-09-26 (×2): 25 mg via ORAL
  Filled 2013-09-24 (×5): qty 1

## 2013-09-24 MED ORDER — NITROGLYCERIN 0.4 MG SL SUBL: 0.4000 mg | SUBLINGUAL_TABLET | SUBLINGUAL | Status: DC | PRN

## 2013-09-24 MED ORDER — ATORVASTATIN CALCIUM 40 MG PO TABS
40.0000 mg | ORAL_TABLET | Freq: Every day | ORAL | Status: DC
  Administered 2013-09-25 – 2013-09-27 (×3): 40 mg via ORAL
  Filled 2013-09-24 (×3): qty 1

## 2013-09-24 MED ORDER — FUROSEMIDE 40 MG PO TABS
40.0000 mg | ORAL_TABLET | Freq: Every day | ORAL | Status: DC
  Administered 2013-09-25: 40 mg via ORAL
  Filled 2013-09-24: qty 1

## 2013-09-24 MED ORDER — MORPHINE SULFATE 4 MG/ML IJ SOLN: 4.0000 mg | INTRAMUSCULAR | Status: DC | PRN

## 2013-09-24 MED ORDER — PREDNISONE 20 MG PO TABS
20.0000 mg | ORAL_TABLET | Freq: Every day | ORAL | Status: DC
  Administered 2013-09-25 – 2013-09-27 (×3): 20 mg via ORAL
  Filled 2013-09-24 (×3): qty 1

## 2013-09-24 MED ORDER — SITAGLIPTIN PHOS-METFORMIN HCL 50-1000 MG PO TABS: 1.0000 | ORAL_TABLET | Freq: Every day | ORAL | Status: DC

## 2013-09-24 MED ORDER — SODIUM CHLORIDE 0.9 % IV SOLN
INTRAVENOUS | Status: DC
  Administered 2013-09-24: 22:00:00 via INTRAVENOUS

## 2013-09-24 NOTE — ED Notes (Signed)
Received notice from lab that pt's blood has specific antibody and will cause delay in obtaining blood appropriate for transfusion.

## 2013-09-24 NOTE — ED Notes (Signed)
Hemocult test negative.

## 2013-09-24 NOTE — ED Provider Notes (Signed)
CSN: [id number]     Arrival date & time 09/24/13  1658 History   First MD Initiated Contact with Patient 09/24/13 1738     Chief Complaint  Patient presents with  . Shortness of Breath     HPI Pt was seen at 1750. Per pt and his family, c/o gradual onset and worsening of persistent SOB for the past 2 days. SOB worsens with laying flat and exertion. Pt states when he lays flat he "starts to cough and choke." Has been associated with increasing pedal edema, mid-sternal chest "pain" and "my abdomen feels swollen." Pt's family states pt "turned a different color" over the past 2 to 3 days. Denies abd pain, no N/V/D, no palpitations, no cough, no back pain.    Past Medical History  Diagnosis Date  . TIA (transient ischemic attack)     2005  . GERD (gastroesophageal reflux disease)   . Hyperlipidemia   . Hypertension   . Depression   . Diabetes mellitus, type 2   . Restrictive lung disease   . Candida esophagitis   . Hemorrhoids   . Arteriovenous malformation small bowel     Capsule study 3/10  . GI bleeding     Cecal ulcers, arteriovenous malformations  . Chronic diarrhea SEP 2011    ?ETIOLOGY-DIABETIC ENTEROPATHY, LACTOSE INTOLERANCE,  OR IBS-MIXED  . Bloating AUG 2009    NL HBT FOR SIBO  . Anemia FEB 2008 FEDA 2o to AVMs, & large colon ulcers    HB 10.7 MCV 74.3; Dr Tressie Stalker  . BPH (benign prostatic hypertrophy)     Dr [place] Pink  . Glaucoma   . Cataract   . ASCVD (arteriosclerotic cardiovascular disease)     LIMA to LAD and LIMA to intermediate branch, SVG to PD and second OM. 06/03/93  . Bilateral carotid artery stenosis     50-75% external left carotid stenosis  . Coronary atherosclerosis of native coronary artery     Multivessel, BMS SVG TO RCA 2000, reportedly  "2" additional stents in interim  . Myocardial infarction     1994  . Stroke     mini- strokes in past.States has weakness and numbness of right leg. Short term memory loss  . COPD (chronic obstructive pulmonary  disease)   . Sleep apnea   . Portal hypertensive gastropathy 11/08/2012    EGD. Dr. Laural Golden  . AVM (arteriovenous malformation) of colon 10/2012    Cecum & cecal polyps  . External hemorrhoids 10/2012  . Dementia    Past Surgical History  Procedure Laterality Date  . Esophagogastroduodenoscopy  SEP 09    DUODENAL LIPOMA  . Capsule endo  03/10  . Right inguinal hernia repair    . Hydrogen breath test  2009  . Colonoscopy  APR 2008    SIMPLE ADENOMA, [place] [facility], IH  . Colonoscopy  FEB 2008 ANEMIA. [facility]     2 LARGE ILEOCEAL ULCERS 2o to ASA, [place] [facility], IH  . Colonoscopy  SEP 2009 TRANSFUSION DEP ANEMIA    AC AVM-ABLATED, [place] [facility], IH  . Upper gastrointestinal endoscopy  SEP 2009    GASTRIC AVM ABLATED, NL DUO Bx  . Cardiac catherization  04/2012  . Cardiac catheterization  04/05/1996 [facility]    EF 45%, occluded SVG to circumflex, patent SVG to D1 and PDA and patent LIMA to LAD with severe native vessel disease.  . Cardiac catheterization  08/22/1998 [facility]    Mild LM, severe LAD,severe CX, occlude RCA, patient  SVG to diatal RCA, patent vein graft to D1 and patent LIMA to LAD.   Marland Kitchen Coronary angioplasty with stent placement  08/22/1998 [name]    TEC stenting of the SVG to RCA-Last seen by cardiologist in 2010.  Marland Kitchen Coronary artery bypass graft  1995-triple bypass    LIMA-LAD, LIMA to intermediate branch, SVG to PD and second OM,  . Cataract extraction w/phaco Right 09/22/2012    Procedure: CATARACT EXTRACTION PHACO AND INTRAOCULAR LENS PLACEMENT (IOC);  Surgeon: Tonny Branch, MD;  Location: [facility];  Service: Ophthalmology;  Laterality: Right;  CDE: 11.43  . Cataract extraction w/phaco Left 10/17/2012    Procedure: CATARACT EXTRACTION PHACO AND INTRAOCULAR LENS PLACEMENT (IOC);  Surgeon: Tonny Branch, MD;  Location: [facility];  Service: Ophthalmology;  Laterality: Left;  CDE: 8.06  . Colonoscopy with esophagogastroduodenoscopy (egd) N/A 11/08/2012    Procedure: COLONOSCOPY WITH  ESOPHAGOGASTRODUODENOSCOPY (EGD);  Surgeon: Rogene Houston, MD;  Location: [facility];  Service: Endoscopy;  Laterality: N/A;  250-rescheduled to 7:30 Ann to notify pt  . Givens capsule study N/A 11/24/2012    Procedure: GIVENS CAPSULE STUDY;  Surgeon: Rogene Houston, MD;  Location: [facility];  Service: Endoscopy;  Laterality: N/A;  730   Family History  Problem Relation Age of Onset  . Heart attack Mother   . Heart disease Mother   . Hypertension Mother    History  Substance Use Topics  . Smoking status: Current Every Day Smoker -- 1.00 packs/day for 60 years    Types: Cigarettes  . Smokeless tobacco: Never Used     Comment: 1 pack a day  . Alcohol Use: No     Comment: No etoh in 12 yrs.    Review of Systems ROS: Statement: All systems negative except as marked or noted in the HPI; Constitutional: Negative for fever and chills. ; ; Eyes: Negative for eye pain, redness and discharge. ; ; ENMT: Negative for ear pain, hoarseness, nasal congestion, sinus pressure and sore throat. ; ; Cardiovascular: +SOB, CP, peripheral edema. Negative for palpitations, diaphoresis. ; ; Respiratory: Negative for cough, wheezing and stridor. ; ; Gastrointestinal: Negative for nausea, vomiting, diarrhea, abdominal pain, blood in stool, hematemesis, jaundice and rectal bleeding. . ; ; Genitourinary: Negative for dysuria, flank pain and hematuria. ; ; Musculoskeletal: Negative for back pain and neck pain. Negative for swelling and trauma.; ; Skin: Negative for pruritus, rash, abrasions, blisters, bruising and skin lesion.; ; Neuro: Negative for headache, lightheadedness and neck stiffness. Negative for weakness, altered level of consciousness , altered mental status, extremity weakness, paresthesias, involuntary movement, seizure and syncope.      Allergies  Penicillins  Home Medications   Prior to Admission medications   Medication Sig Start Date End Date Taking? Authorizing Provider  cetirizine  (ZYRTEC) 10 MG tablet Take 10 mg by mouth every morning.    Yes Historical Provider, MD  clopidogrel (PLAVIX) 75 MG tablet Take 75 mg by mouth every morning.    Yes Historical Provider, MD  cycloSPORINE (RESTASIS) 0.05 % ophthalmic emulsion Place 1 drop into both eyes 2 (two) times daily.   Yes Historical Provider, MD  DULoxetine (CYMBALTA) 30 MG capsule Take 30 mg by mouth 2 (two) times daily.   Yes Historical Provider, MD  esomeprazole (NEXIUM) 40 MG capsule Take 40 mg by mouth daily at 12 noon.   Yes Historical Provider, MD  furosemide (LASIX) 40 MG tablet Take 40 mg by mouth daily.    Yes  Historical Provider, MD  isosorbide mononitrate (IMDUR) 60 MG 24 hr tablet Take 1 tablet (60 mg total) by mouth daily. 06/01/13  Yes Lendon Colonel, NP  losartan (COZAAR) 50 MG tablet Take 50 mg by mouth every morning.  09/23/10  Yes Historical Provider, MD  mirabegron ER (MYRBETRIQ) 25 MG TB24 tablet Take 25 mg by mouth daily.   Yes Historical Provider, MD  NITROSTAT 0.4 MG SL tablet Place 0.4 mg under the tongue every 5 (five) minutes as needed for chest pain.  05/23/10  Yes Historical Provider, MD  rosuvastatin (CRESTOR) 20 MG tablet Take 20 mg by mouth every morning.    Yes Historical Provider, MD  Tamsulosin HCl (FLOMAX) 0.4 MG CAPS Take 0.4 mg by mouth every morning. Takes in AM   Yes Historical Provider, MD  traMADol (ULTRAM) 50 MG tablet Take 50 mg by mouth every 8 (eight) hours as needed for pain.   Yes Historical Provider, MD  ADVAIR DISKUS 250-50 MCG/DOSE AEPB Inhale 1 puff into the lungs 2 (two) times daily.  10/02/10   Historical Provider, MD  albuterol (PROAIR HFA) 108 (90 BASE) MCG/ACT inhaler Inhale 2 puffs into the lungs every 6 (six) hours as needed for wheezing or shortness of breath.    Historical Provider, MD  ALPRAZolam Duanne Moron) 0.5 MG tablet Take 0.5 mg by mouth 2 (two) times daily as needed for anxiety.     Historical Provider, MD  AVODART 0.5 MG capsule Take 0.5 mg by mouth every morning.   11/21/12   Historical Provider, MD  beta carotene w/minerals (OCUVITE) tablet Take 1 tablet by mouth daily.    Historical Provider, MD  Carboxymethylcellul-Glycerin (REFRESH OPTIVE OP) Place 1 drop into the right eye 3 (three) times daily. For both eyes    Historical Provider, MD  carvedilol (COREG) 6.25 MG tablet Take 6.25 mg by mouth 2 (two) times daily with a meal.     Historical Provider, MD  JANUMET 50-1000 MG per tablet  07/22/13   Historical Provider, MD  omeprazole (PRILOSEC) 20 MG capsule Take 20 mg by mouth daily.  08/28/13   Historical Provider, MD  oxyCODONE-acetaminophen (PERCOCET/ROXICET) 5-325 MG per tablet Take 1 tablet by mouth every 6 (six) hours as needed for severe pain. 07/13/13   Maudry Diego, MD  predniSONE (DELTASONE) 10 MG tablet Take 2 tablets (20 mg total) by mouth daily. 07/13/13   Maudry Diego, MD   BP 145/68  Pulse 56  Temp(Src) 97.8 F (36.6 C) (Oral)  Resp 20  SpO2 100% Physical Exam 1755: Physical examination:  Nursing notes reviewed; Vital signs and O2 SAT reviewed;  Constitutional: Well developed, Well nourished, In no acute distress; Head:  Normocephalic, atraumatic; Eyes: EOMI, PERRL, +conjunctiva pale.; ENMT: Mouth and pharynx normal, Mucous membranes dry; Neck: Supple, Full range of motion, No lymphadenopathy; Cardiovascular: Regular rate and rhythm, No gallop; Respiratory: Breath sounds coarse & equal bilaterally, No wheezes.  Speaking full sentences with ease, Normal respiratory effort/excursion; Chest: Nontender, Movement normal; Abdomen: Soft, Nontender, Nondistended, Normal bowel sounds. Rectal exam performed w/permission of pt and ED RN chaperone present.  Anal tone normal.  Non-tender, soft brown stool in rectal vault, heme neg.  No fissures, no external hemorrhoids, no palp masses.; Genitourinary: No CVA tenderness; Extremities: Pulses normal, No tenderness, +tr pedal edema bilat. No calf edema or asymmetry.; Neuro: AA&Ox3, vague historian. Major CN  grossly intact.  Speech clear. No gross focal motor or sensory deficits in extremities.; Skin: Color pale, Warm, Dry.  ED Course  Procedures     EKG Interpretation   Date/Time:  Sunday September 24 2013 17:32:15 EDT Ventricular Rate:  66 PR Interval:  168 QRS Duration: 106 QT Interval:  446 QTC Calculation: 467 R Axis:   8 Text Interpretation:  Sinus rhythm Premature ventricular complexes T wave  abnormality Baseline wander Artifact When compared with ECG of 11/24/2012 No  significant change was found Confirmed by [facility]  MD, Nunzio Cory [id number])  on 09/24/2013 6:17:18 PM      MDM  MDM Reviewed: previous chart, nursing note and vitals Reviewed previous: labs and ECG Interpretation: labs, ECG and x-ray Total time providing critical care: 30-74 minutes. This excludes time spent performing separately reportable procedures and services. Consults: admitting MD   CRITICAL CARE Performed by: Alfonzo Feller Total critical care time: 35 Critical care time was exclusive of separately billable procedures and treating other patients. Critical care was necessary to treat or prevent imminent or life-threatening deterioration. Critical care was time spent personally by me on the following activities: development of treatment plan with patient and/or surrogate as well as nursing, discussions with consultants, evaluation of patient's response to treatment, examination of patient, obtaining history from patient or surrogate, ordering and performing treatments and interventions, ordering and review of laboratory studies, ordering and review of radiographic studies, pulse oximetry and re-evaluation of patient's condition.    Results for orders placed during the hospital encounter of 09/24/13  URINALYSIS, ROUTINE W REFLEX MICROSCOPIC      Result Value Ref Range   Color, Urine AMBER (*) YELLOW   APPearance CLEAR  CLEAR   Specific Gravity, Urine 1.015  1.005 - 1.030   pH 6.0  5.0 - 8.0   Glucose,  UA NEGATIVE  NEGATIVE mg/dL   Hgb urine dipstick NEGATIVE  NEGATIVE   Bilirubin Urine NEGATIVE  NEGATIVE   Ketones, ur NEGATIVE  NEGATIVE mg/dL   Protein, ur TRACE (*) NEGATIVE mg/dL   Urobilinogen, UA 1.0  0.0 - 1.0 mg/dL   Nitrite NEGATIVE  NEGATIVE   Leukocytes, UA NEGATIVE  NEGATIVE  CBC WITH DIFFERENTIAL      Result Value Ref Range   WBC 5.6  4.0 - 10.5 K/uL   RBC 3.24 (*) 4.22 - 5.81 MIL/uL   Hemoglobin 7.1 (*) 13.0 - 17.0 g/dL   HCT 24.4 (*) 39.0 - 52.0 %   MCV 75.3 (*) 78.0 - 100.0 fL   MCH 21.9 (*) 26.0 - 34.0 pg   MCHC 29.1 (*) 30.0 - 36.0 g/dL   RDW 17.2 (*) 11.5 - 15.5 %   Platelets 228  150 - 400 K/uL   Neutrophils Relative % 71  43 - 77 %   Neutro Abs 4.0  1.7 - 7.7 K/uL   Lymphocytes Relative 18  12 - 46 %   Lymphs Abs 1.0  0.7 - 4.0 K/uL   Monocytes Relative 8  3 - 12 %   Monocytes Absolute 0.5  0.1 - 1.0 K/uL   Eosinophils Relative 2  0 - 5 %   Eosinophils Absolute 0.1  0.0 - 0.7 K/uL   Basophils Relative 1  0 - 1 %   Basophils Absolute 0.1  0.0 - 0.1 K/uL  TROPONIN I      Result Value Ref Range   Troponin I <0.30  <0.30 ng/mL  PRO B NATRIURETIC PEPTIDE      Result Value Ref Range   Pro B Natriuretic peptide (BNP) 2129.0 (*) 0 - 125 pg/mL  COMPREHENSIVE METABOLIC PANEL  Result Value Ref Range   Sodium 142  137 - 147 mEq/L   Potassium 3.2 (*) 3.7 - 5.3 mEq/L   Chloride 103  96 - 112 mEq/L   CO2 26  19 - 32 mEq/L   Glucose, Bld 106 (*) 70 - 99 mg/dL   BUN 16  6 - 23 mg/dL   Creatinine, Ser 0.97  0.50 - 1.35 mg/dL   Calcium 9.2  8.4 - 10.5 mg/dL   Total Protein 7.4  6.0 - 8.3 g/dL   Albumin 3.6  3.5 - 5.2 g/dL   AST 25  0 - 37 U/L   ALT 13  0 - 53 U/L   Alkaline Phosphatase 102  39 - 117 U/L   Total Bilirubin 0.4  0.3 - 1.2 mg/dL   GFR calc non Af Amer 82 (*) >90 mL/min   GFR calc Af Amer >90  >90 mL/min   Anion gap 13  5 - 15  PROTIME-INR      Result Value Ref Range   Prothrombin Time 13.7  11.6 - 15.2 seconds   INR 1.05  0.00 - 1.49  TYPE  AND SCREEN      Result Value Ref Range   ABO/RH(D) O POS     Antibody Screen NEG     Sample Expiration 09/27/2013    PREPARE RBC (CROSSMATCH)      Result Value Ref Range   Order Confirmation ORDER PROCESSED BY BLOOD BANK     Dg Chest 2 View 09/24/2013   CLINICAL DATA:  Shortness of breath.  Weakness.  EXAM: CHEST  2 VIEW  COMPARISON:  11/15/2012.  FINDINGS: Mildly progressive enlargement of the cardiac silhouette. Stable post CABG changes. Stable prominence of the interstitial markings. Mild residual right pleural thickening. Unremarkable bones. Atheromatous arterial calcifications.  IMPRESSION: 1. No acute abnormality. 2. Mildly progressive cardiomegaly. 3. Stable chronic interstitial lung disease.   Electronically Signed   By: Enrique Sack M.D.   On: 09/24/2013 18:25    Results for CECILIA, FORDHAM (MRN [id number]) as of 09/24/2013 19:26  Ref. Range 11/15/2012 13:08 11/16/2012 04:11 11/17/2012 05:14 05/01/2013 11:50 09/24/2013 17:50  Hemoglobin Latest Range: 13.0-17.0 g/dL 8.6 (L) 8.5 (L) 8.4 (L) 9.4 (L) 7.1 (L)  HCT Latest Range: 39.0-52.0 % 29.2 (L) 28.3 (L) 28.4 (L) 30.6 (L) 24.4 (L)    2015:  BNP per baseline. Stool heme negative, but pt with known GI AVM. H/H today is lower than previous, will transfuse PRBC's. Denies CP while at rest. T/C to [facility]. Humphrey Rolls, case discussed, including:  HPI, pertinent PM/SHx, VS/PE, dx testing, ED course and treatment:  Agreeable to admit, requests he will come to the ED for evaluation.   Alfonzo Feller, DO 09/26/13 1232

## 2013-09-24 NOTE — ED Notes (Signed)
Pt called out stating he was having trouble catching his breath. No distress noted when entering the room. O2 at 90-95 RA. Placed pt on 2L [place] and O2 increased to 98%. NAD.

## 2013-09-24 NOTE — H&P (Signed)
[facility] History and Physical  Donald Gaines [id number] DOB: [birth year] DOA: 09/24/2013  Referring physician: Francine Graven, DO PCP: Delphina Cahill, MD   Chief Complaint: Anemia  HPI: Donald Gaines is a 73 y.o. male presented to the ED with shortness of breath. He states that he was having a great deal of difficulty getting around due to his shortness of breath and also having dificulty laying down. Patient states that he has some associated chest pain though he is not clear about the locations. In addition he states he was having some abdominal discomfort. Patient has had some black looking stools also. He states he has had no vomiting of blood. He states that he has noted on occasion swelling of his legs too.   Review of Systems:  Constitutional:  No weight loss, night sweats, Fevers, chills, fatigue.  HEENT:  No headaches, Difficulty swallowing Cardio-vascular:  ++chest pain, ++Orthopnea, ++swelling in lower extremities  GI:  No heartburn, indigestion, abdominal pain, nausea, vomiting, diarrhea ++black stools  Resp:  No shortness of breath with exertion or at rest. No excess mucus, no productive cough, No non-productive cough, No coughing up of blood Skin:  no rash or lesions.  GU:  no dysuria, change in color of urine.  Musculoskeletal:  No joint pain or swelling.   Psych:  No change in mood or affect. No depression or anxiety. ++memory loss.   Past Medical History  Diagnosis Date  . TIA (transient ischemic attack)     2005  . GERD (gastroesophageal reflux disease)   . Hyperlipidemia   . Hypertension   . Depression   . Diabetes mellitus, type 2   . Restrictive lung disease   . Candida esophagitis   . Hemorrhoids   . Arteriovenous malformation small bowel     Capsule study 3/10  . GI bleeding     Cecal ulcers, arteriovenous malformations  . Chronic diarrhea SEP 2011    ?ETIOLOGY-DIABETIC ENTEROPATHY, LACTOSE INTOLERANCE,  OR IBS-MIXED  .  Bloating AUG 2009    NL HBT FOR SIBO  . Anemia FEB 2008 FEDA 2o to AVMs, & large colon ulcers    HB 10.7 MCV 74.3; Dr Tressie Stalker  . BPH (benign prostatic hypertrophy)     Dr [place] Pink  . Glaucoma   . Cataract   . ASCVD (arteriosclerotic cardiovascular disease)     LIMA to LAD and LIMA to intermediate branch, SVG to PD and second OM. 06/03/93  . Bilateral carotid artery stenosis     50-75% external left carotid stenosis  . Coronary atherosclerosis of native coronary artery     Multivessel, BMS SVG TO RCA 2000, reportedly  "2" additional stents in interim  . Myocardial infarction     1994  . Stroke     mini- strokes in past.States has weakness and numbness of right leg. Short term memory loss  . COPD (chronic obstructive pulmonary disease)   . Sleep apnea   . Portal hypertensive gastropathy 11/08/2012    EGD. Dr. Laural Golden  . AVM (arteriovenous malformation) of colon 10/2012    Cecum & cecal polyps  . External hemorrhoids 10/2012  . Dementia    Past Surgical History  Procedure Laterality Date  . Esophagogastroduodenoscopy  SEP 09    DUODENAL LIPOMA  . Capsule endo  03/10  . Right inguinal hernia repair    . Hydrogen breath test  2009  . Colonoscopy  APR 2008    SIMPLE ADENOMA, [place] [facility], IH  . Colonoscopy  FEB 2008 ANEMIA. MELENA     2 LARGE ILEOCEAL ULCERS 2o to ASA, [place] [facility], IH  . Colonoscopy  SEP 2009 TRANSFUSION DEP ANEMIA    AC AVM-ABLATED, [place] [facility], IH  . Upper gastrointestinal endoscopy  SEP 2009    GASTRIC AVM ABLATED, NL DUO Bx  . Cardiac catherization  04/2012  . Cardiac catheterization  04/05/1996 [facility]    EF 45%, occluded SVG to circumflex, patent SVG to D1 and PDA and patent LIMA to LAD with severe native vessel disease.  . Cardiac catheterization  08/22/1998 [facility]    Mild LM, severe LAD,severe CX, occlude RCA, patient  SVG to diatal RCA, patent vein graft to D1 and patent LIMA to LAD.   Marland Kitchen Coronary angioplasty with stent placement  08/22/1998 [name]     TEC stenting of the SVG to RCA-Last seen by cardiologist in 2010.  Marland Kitchen Coronary artery bypass graft  1995-triple bypass    LIMA-LAD, LIMA to intermediate branch, SVG to PD and second OM,  . Cataract extraction w/phaco Right 09/22/2012    Procedure: CATARACT EXTRACTION PHACO AND INTRAOCULAR LENS PLACEMENT (IOC);  Surgeon: Tonny Branch, MD;  Location: [facility];  Service: Ophthalmology;  Laterality: Right;  CDE: 11.43  . Cataract extraction w/phaco Left 10/17/2012    Procedure: CATARACT EXTRACTION PHACO AND INTRAOCULAR LENS PLACEMENT (IOC);  Surgeon: Tonny Branch, MD;  Location: [facility];  Service: Ophthalmology;  Laterality: Left;  CDE: 8.06  . Colonoscopy with esophagogastroduodenoscopy (egd) N/A 11/08/2012    Procedure: COLONOSCOPY WITH ESOPHAGOGASTRODUODENOSCOPY (EGD);  Surgeon: Rogene Houston, MD;  Location: [facility];  Service: Endoscopy;  Laterality: N/A;  250-rescheduled to 7:30 Ann to notify pt  . Givens capsule study N/A 11/24/2012    Procedure: GIVENS CAPSULE STUDY;  Surgeon: Rogene Houston, MD;  Location: [facility];  Service: Endoscopy;  Laterality: N/A;  730   Social History:  reports that he has been smoking Cigarettes.  He has a 60 pack-year smoking history. He has never used smokeless tobacco. He reports that he does not drink alcohol or use illicit drugs.  Allergies  Allergen Reactions  . Penicillins Rash    Family History  Problem Relation Age of Onset  . Heart attack Mother   . Heart disease Mother   . Hypertension Mother      Prior to Admission medications   Medication Sig Start Date End Date Taking? Authorizing Provider  cetirizine (ZYRTEC) 10 MG tablet Take 10 mg by mouth every morning.    Yes Historical Provider, MD  clopidogrel (PLAVIX) 75 MG tablet Take 75 mg by mouth every morning.    Yes Historical Provider, MD  cycloSPORINE (RESTASIS) 0.05 % ophthalmic emulsion Place 1 drop into both eyes 2 (two) times daily.   Yes Historical Provider, MD  DULoxetine (CYMBALTA) 30  MG capsule Take 30 mg by mouth 2 (two) times daily.   Yes Historical Provider, MD  esomeprazole (NEXIUM) 40 MG capsule Take 40 mg by mouth daily at 12 noon.   Yes Historical Provider, MD  furosemide (LASIX) 40 MG tablet Take 40 mg by mouth daily.    Yes Historical Provider, MD  isosorbide mononitrate (IMDUR) 60 MG 24 hr tablet Take 1 tablet (60 mg total) by mouth daily. 06/01/13  Yes Lendon Colonel, NP  losartan (COZAAR) 50 MG tablet Take 50 mg by mouth every morning.  09/23/10  Yes Historical Provider, MD  mirabegron ER (MYRBETRIQ) 25 MG TB24 tablet Take 25 mg by  mouth daily.   Yes Historical Provider, MD  NITROSTAT 0.4 MG SL tablet Place 0.4 mg under the tongue every 5 (five) minutes as needed for chest pain.  05/23/10  Yes Historical Provider, MD  rosuvastatin (CRESTOR) 20 MG tablet Take 20 mg by mouth every morning.    Yes Historical Provider, MD  Tamsulosin HCl (FLOMAX) 0.4 MG CAPS Take 0.4 mg by mouth every morning. Takes in AM   Yes Historical Provider, MD  traMADol (ULTRAM) 50 MG tablet Take 50 mg by mouth every 8 (eight) hours as needed for pain.   Yes Historical Provider, MD  ADVAIR DISKUS 250-50 MCG/DOSE AEPB Inhale 1 puff into the lungs 2 (two) times daily.  10/02/10   Historical Provider, MD  albuterol (PROAIR HFA) 108 (90 BASE) MCG/ACT inhaler Inhale 2 puffs into the lungs every 6 (six) hours as needed for wheezing or shortness of breath.    Historical Provider, MD  ALPRAZolam Duanne Moron) 0.5 MG tablet Take 0.5 mg by mouth 2 (two) times daily as needed for anxiety.     Historical Provider, MD  AVODART 0.5 MG capsule Take 0.5 mg by mouth every morning.  11/21/12   Historical Provider, MD  beta carotene w/minerals (OCUVITE) tablet Take 1 tablet by mouth daily.    Historical Provider, MD  Carboxymethylcellul-Glycerin (REFRESH OPTIVE OP) Place 1 drop into the right eye 3 (three) times daily. For both eyes    Historical Provider, MD  carvedilol (COREG) 6.25 MG tablet Take 6.25 mg by mouth 2 (two)  times daily with a meal.     Historical Provider, MD  JANUMET 50-1000 MG per tablet  07/22/13   Historical Provider, MD  omeprazole (PRILOSEC) 20 MG capsule Take 20 mg by mouth daily.  08/28/13   Historical Provider, MD  oxyCODONE-acetaminophen (PERCOCET/ROXICET) 5-325 MG per tablet Take 1 tablet by mouth every 6 (six) hours as needed for severe pain. 07/13/13   Maudry Diego, MD  predniSONE (DELTASONE) 10 MG tablet Take 2 tablets (20 mg total) by mouth daily. 07/13/13   Maudry Diego, MD   Physical Exam: Filed Vitals:   09/24/13 1930  BP: 145/68  Pulse: 56  Temp:   Resp: 20    BP 145/68  Pulse 56  Temp(Src) 97.8 F (36.6 C) (Oral)  Resp 20  SpO2 100%  General:  Appears calm and comfortable Eyes: PERRL, normal lids, irises & conjunctiva ENT: grossly normal hearing, lips & tongue Neck: no LAD, masses or thyromegaly Cardiovascular: RRR, no m/r/g. No LE edema. Respiratory: CTA bilaterally, no w/r/r. Normal respiratory effort. Abdomen: soft, ntnd Skin: no rash or induration seen on limited exam Musculoskeletal: grossly normal tone BUE/BLE Psychiatric: grossly normal mood and affect, speech fluent and appropriate Neurologic: grossly non-focal. Appears somewhat forgetful states he has alzheimers          Labs on Admission:  Basic Metabolic Panel:  Recent Labs Lab 09/24/13 1754  NA 142  K 3.2*  CL 103  CO2 26  GLUCOSE 106*  BUN 16  CREATININE 0.97  CALCIUM 9.2   Liver Function Tests:  Recent Labs Lab 09/24/13 1754  AST 25  ALT 13  ALKPHOS 102  BILITOT 0.4  PROT 7.4  ALBUMIN 3.6   No results found for this basename: LIPASE, AMYLASE,  in the last 168 hours No results found for this basename: AMMONIA,  in the last 168 hours CBC:  Recent Labs Lab 09/24/13 1750  WBC 5.6  NEUTROABS 4.0  HGB 7.1*  HCT  24.4*  MCV 75.3*  PLT 228   Cardiac Enzymes:  Recent Labs Lab 09/24/13 1750  TROPONINI <0.30    BNP (last 3 results)  Recent Labs  11/15/12 1308  09/24/13 1750  PROBNP 2764.0* 2129.0*   CBG: No results found for this basename: GLUCAP,  in the last 168 hours  Radiological Exams on Admission: Dg Chest 2 View  09/24/2013   CLINICAL DATA:  Shortness of breath.  Weakness.  EXAM: CHEST  2 VIEW  COMPARISON:  11/15/2012.  FINDINGS: Mildly progressive enlargement of the cardiac silhouette. Stable post CABG changes. Stable prominence of the interstitial markings. Mild residual right pleural thickening. Unremarkable bones. Atheromatous arterial calcifications.  IMPRESSION: 1. No acute abnormality. 2. Mildly progressive cardiomegaly. 3. Stable chronic interstitial lung disease.   Electronically Signed   By: Enrique Sack M.D.   On: 09/24/2013 18:25     Assessment/Plan Principal Problem:   Anemia Active Problems:   DIABETES MELLITUS   TOBACCO ABUSE   GERD   COPD (chronic obstructive pulmonary disease)   1. Anemia -patient did describe black appearing stools may represent GI blood loss -will continue on PPIs -will get GI consult -transfusion ordered by ED  2. Diabetes Mellitus Type II -will continue home meds -monitor CBG  3. GERD -will continue with PPI  4. COPD -continue with inhalers  5. CHF -currently appears to be stable -will monitor     Code Status: Full Code (must indicate code status--if unknown or must be presumed, indicate so) Family Communication: None (indicate person spoken with, if applicable, with phone number if by telephone) Disposition Plan: Home (indicate anticipated LOS)  Time spent: 60min  [place] Jr A [facility] Pager [phone]  **Disclaimer: This note may have been dictated with voice recognition software. Similar sounding words can inadvertently be transcribed and this note may contain transcription errors which may not have been corrected upon publication of note.**

## 2013-09-24 NOTE — ED Notes (Signed)
Pt denies pain at present time.  No distress noted.  Pt able to rest quietly, respirations regular even and unlabored.

## 2013-09-24 NOTE — ED Notes (Signed)
Pt c/o sob that is worse with laying down, exertion, mid center chest pain that radiates to left side, did take one nitro SL that did help with pain, pt also reports that he has had increase in abd and leg swelling over the past day or two,

## 2013-09-25 DIAGNOSIS — E119 Type 2 diabetes mellitus without complications: Secondary | ICD-10-CM

## 2013-09-25 DIAGNOSIS — J9600 Acute respiratory failure, unspecified whether with hypoxia or hypercapnia: Secondary | ICD-10-CM

## 2013-09-25 DIAGNOSIS — D649 Anemia, unspecified: Secondary | ICD-10-CM

## 2013-09-25 DIAGNOSIS — K922 Gastrointestinal hemorrhage, unspecified: Principal | ICD-10-CM

## 2013-09-25 LAB — COMPREHENSIVE METABOLIC PANEL
ALK PHOS: 103 U/L (ref 39–117)
ALT: 12 U/L (ref 0–53)
AST: 21 U/L (ref 0–37)
Albumin: 3.5 g/dL (ref 3.5–5.2)
Anion gap: 9 (ref 5–15)
BUN: 14 mg/dL (ref 6–23)
CHLORIDE: 106 meq/L (ref 96–112)
CO2: 27 meq/L (ref 19–32)
Calcium: 8.8 mg/dL (ref 8.4–10.5)
Creatinine, Ser: 1 mg/dL (ref 0.50–1.35)
GFR calc Af Amer: 86 mL/min — ABNORMAL LOW (ref >90)
GFR, EST NON AFRICAN AMERICAN: 74 mL/min — AB (ref >90)
GLUCOSE: 141 mg/dL — AB (ref 70–99)
POTASSIUM: 4 meq/L (ref 3.7–5.3)
SODIUM: 142 meq/L (ref 137–147)
Total Bilirubin: 0.9 mg/dL (ref 0.3–1.2)
Total Protein: 7.2 g/dL (ref 6.0–8.3)

## 2013-09-25 LAB — CBC
HCT: 27.1 % — ABNORMAL LOW (ref 39.0–52.0)
HEMATOCRIT: 26.8 % — AB (ref 39.0–52.0)
HEMOGLOBIN: 7.9 g/dL — AB (ref 13.0–17.0)
Hemoglobin: 7.9 g/dL — ABNORMAL LOW (ref 13.0–17.0)
MCH: 22.6 pg — ABNORMAL LOW (ref 26.0–34.0)
MCH: 22.8 pg — ABNORMAL LOW (ref 26.0–34.0)
MCHC: 29.2 g/dL — AB (ref 30.0–36.0)
MCHC: 29.5 g/dL — ABNORMAL LOW (ref 30.0–36.0)
MCV: 77.4 fL — ABNORMAL LOW (ref 78.0–100.0)
MCV: 77.2 fL — ABNORMAL LOW (ref 78.0–100.0)
PLATELETS: 218 10*3/uL (ref 150–400)
Platelets: 226 10*3/uL (ref 150–400)
RBC: 3.5 MIL/uL — ABNORMAL LOW (ref 4.22–5.81)
RBC: 3.47 MIL/uL — ABNORMAL LOW (ref 4.22–5.81)
RDW: 17.1 % — ABNORMAL HIGH (ref 11.5–15.5)
RDW: 17.1 % — ABNORMAL HIGH (ref 11.5–15.5)
WBC: 5 10*3/uL (ref 4.0–10.5)
WBC: 7.1 10*3/uL (ref 4.0–10.5)

## 2013-09-25 LAB — OCCULT BLOOD, POC DEVICE: Fecal Occult Bld: NEGATIVE

## 2013-09-25 LAB — GLUCOSE, CAPILLARY
GLUCOSE-CAPILLARY: 167 mg/dL — AB (ref 70–99)
Glucose-Capillary: 113 mg/dL — ABNORMAL HIGH (ref 70–99)

## 2013-09-25 LAB — IRON AND TIBC
IRON: 12 ug/dL — AB (ref 42–135)
Saturation Ratios: 2 % — ABNORMAL LOW (ref 20–55)
TIBC: 513 ug/dL — ABNORMAL HIGH (ref 215–435)
UIBC: 501 ug/dL — ABNORMAL HIGH (ref 125–400)

## 2013-09-25 LAB — VITAMIN B12: Vitamin B-12: 412 pg/mL (ref 211–911)

## 2013-09-25 LAB — HEMOGLOBIN A1C
Hgb A1c MFr Bld: 6.7 % — ABNORMAL HIGH (ref <5.7)
Mean Plasma Glucose: 146 mg/dL — ABNORMAL HIGH (ref <117)

## 2013-09-25 LAB — TSH: TSH: 6.24 u[IU]/mL — AB (ref 0.350–4.500)

## 2013-09-25 MED ORDER — FUROSEMIDE 10 MG/ML IJ SOLN
40.0000 mg | Freq: Two times a day (BID) | INTRAMUSCULAR | Status: DC
  Administered 2013-09-25 – 2013-09-27 (×4): 40 mg via INTRAVENOUS
  Filled 2013-09-25 (×4): qty 4

## 2013-09-25 MED ORDER — BIOTENE DRY MOUTH MT LIQD
15.0000 mL | Freq: Two times a day (BID) | OROMUCOSAL | Status: DC
  Administered 2013-09-25 – 2013-09-27 (×5): 15 mL via OROMUCOSAL

## 2013-09-25 MED ORDER — PNEUMOCOCCAL VAC POLYVALENT 25 MCG/0.5ML IJ INJ
0.5000 mL | INJECTION | INTRAMUSCULAR | Status: DC
  Filled 2013-09-25: qty 0.5

## 2013-09-25 NOTE — Progress Notes (Signed)
[facility] PROGRESS NOTE  AARIB CIMMINO [id number] DOB: [birth year] DOA: 09/24/2013 PCP: Delphina Cahill, MD  Assessment/Plan: 1. Anemia -patient did describe black appearing stools may represent GI blood loss. None since admission. Continue PPIs. awaitl get GI consult. S/p 1 unit of blood. Will recheck hg this afternoon.  2. Diabetes Mellitus Type II: Await A1c. Continue home meds. CBG range 113. 3. GERD:  Stable: continue with PPI  4. COPD: remains sob likely related to #1. He does not usually wear oxygen. sats 99% on 2L. Continue with inhalers  5. CHF: appear compensated. Continue home lasix, imdur, cozaar. Monitor daily weight and intake and output  Code Status: full Family Communication: none Disposition Plan: home   Consultants:  GI  Procedures:  none  Antibiotics:  none  HPI/Subjective: Awake. Reports feeling fatigued. Continued sob. Denies chest pain/palpitations  Objective: Filed Vitals:   09/25/13 0643  BP: 163/77  Pulse: 58  Temp: 98 F (36.7 C)  Resp: 20    Intake/Output Summary (Last 24 hours) at 09/25/13 1241 Last data filed at 09/25/13 1043  Gross per 24 hour  Intake  922.5 ml  Output    300 ml  Net  622.5 ml   Filed Weights   09/24/13 2127  Weight: 83.008 kg (183 lb)    Exam:   General:  Obese soft   Cardiovascular: RRR no MGR no LE edema  Respiratory: mild increased work of breathing with conversation. BS distant but clear. No crackles   Abdomen: obese soft +BS non-tender to palpaton  Musculoskeletal: no clubbing or cyanosis   Data Reviewed: Basic Metabolic Panel:  Recent Labs Lab 09/24/13 1754 09/25/13 0906  NA 142 142  K 3.2* 4.0  CL 103 106  CO2 26 27  GLUCOSE 106* 141*  BUN 16 14  CREATININE 0.97 1.00  CALCIUM 9.2 8.8   Liver Function Tests:  Recent Labs Lab 09/24/13 1754 09/25/13 0906  AST 25 21  ALT 13 12  ALKPHOS 102 103  BILITOT 0.4 0.9  PROT 7.4 7.2  ALBUMIN 3.6 3.5   No results found for  this basename: LIPASE, AMYLASE,  in the last 168 hours No results found for this basename: AMMONIA,  in the last 168 hours CBC:  Recent Labs Lab 09/24/13 1750 09/25/13 0906  WBC 5.6 5.0  NEUTROABS 4.0  --   HGB 7.1* 7.9*  HCT 24.4* 27.1*  MCV 75.3* 77.4*  PLT 228 218   Cardiac Enzymes:  Recent Labs Lab 09/24/13 1750  TROPONINI <0.30   BNP (last 3 results)  Recent Labs  11/15/12 1308 09/24/13 1750  PROBNP 2764.0* 2129.0*   CBG:  Recent Labs Lab 09/25/13 0735  GLUCAP 113*    No results found for this or any previous visit (from the past 240 hour(s)).   Studies: Dg Chest 2 View  09/24/2013   CLINICAL DATA:  Shortness of breath.  Weakness.  EXAM: CHEST  2 VIEW  COMPARISON:  11/15/2012.  FINDINGS: Mildly progressive enlargement of the cardiac silhouette. Stable post CABG changes. Stable prominence of the interstitial markings. Mild residual right pleural thickening. Unremarkable bones. Atheromatous arterial calcifications.  IMPRESSION: 1. No acute abnormality. 2. Mildly progressive cardiomegaly. 3. Stable chronic interstitial lung disease.   Electronically Signed   By: Enrique Sack M.D.   On: 09/24/2013 18:25    Scheduled Meds: . antiseptic oral rinse  15 mL Mouth Rinse BID  . atorvastatin  40 mg Oral q1800  . carvedilol  6.25 mg Oral BID  WC  . cycloSPORINE  1 drop Both Eyes BID  . DULoxetine  30 mg Oral BID  . dutasteride  0.5 mg Oral q morning - [id number]  . folic acid  1 mg Oral Daily  . furosemide  40 mg Oral Daily  . isosorbide mononitrate  60 mg Oral Daily  . linagliptin  5 mg Oral Q breakfast   And  . metFORMIN  1,000 mg Oral Q breakfast  . losartan  50 mg Oral q morning - 10a  . mirabegron ER  25 mg Oral Daily  . mometasone-formoterol  2 puff Inhalation BID  . multivitamin with minerals  1 tablet Oral Daily  . pantoprazole  40 mg Oral Daily  . predniSONE  20 mg Oral Daily  . sodium chloride  3 mL Intravenous Q12H  . tamsulosin  0.4 mg Oral q morning - 10a   . thiamine  100 mg Oral Daily   Continuous Infusions: . sodium chloride 50 mL/hr at 09/24/13 2147    Principal Problem:   Anemia Active Problems:   DIABETES MELLITUS   TOBACCO ABUSE   GERD   COPD (chronic obstructive pulmonary disease)    Time spent: 30 minutes    [facility] Pager [phone]  If 7PM-7AM, please contact night-coverage at [email or web address], password [facility] 09/25/2013, 12:41 PM  LOS: 1 day

## 2013-09-25 NOTE — Care Management Utilization Note (Signed)
UR completed 

## 2013-09-25 NOTE — Progress Notes (Signed)
C/o SOB. O@ sat's 99% on 2L [place]. Increased to 3L for comfort. Called lab to check on status of blood . Will be ready in about 30 minutes per lab.

## 2013-09-25 NOTE — Consult Note (Signed)
Reason for Andrews Referring Physician: Hospitalist  Donald Gaines is an 73 y.o. male.  HPI:  Admitted thru the ED last night. Presented to the ED with SOB. States he was unable to lie flat because of his breathing.  He could sleep sitting up. Hx of COPD He also states he has been having black stools off on and on for about a month. He does not take iron supplements. He tells me he thought he may be having some GI bleeding with the black stools His last BM was yesterday and was yellowish-brown. Hx of iron deficiency anemia.  Has undergone EGD/colonoscopy/ and a Given's capsule for this in the past. He has had some abdominal pain 2 days ago with swelling. He has not taken any Bismol.  Presently takes Plavix. Hx of cardiac stents per patient.  Has been transfused with one unit and will have 2nd unit today.  No NSAIDs.  11/27/2012 Given Capsule: Melena, anemia:  Summary & Recommendations:  Two small duodenal diverticula without stigmata of bleed.  Two small arterial venous malformations and change in them without stigmata of bleed.  Slow transit of Givens capsule through small bowel with small bowel passage time of 6 hours and 54 min.  Patient advised to resume ferrous sulfate 325 mg by mouth twice a day.  He will have CBC repeated at the time of office visit with Dr. Wende Neighbors next week.  Office visit in 4 weeks.  10/2012 EGD/Colonoscopy:  Prep excellent.  Normal mucosa of terminal ileum.  Five small cecal polyps removed; three via cold snare and two while cold biopsy. All of these polyps were submitted together.  Single small cecal AV malformation which was not bleeding and was left alone.  3 mm rectal polyp cold snared.  Prominent hemorrhoids below the dentate line. Biopsy:  Cecal polyps are tubular adenomas. Rectal polyp is hyperplastic. Results review of the patient's wife Tania. Next colonoscopy in 5 years.       Past Medical History  Diagnosis Date  . TIA  (transient ischemic attack)     2005  . GERD (gastroesophageal reflux disease)   . Hyperlipidemia   . Hypertension   . Depression   . Diabetes mellitus, type 2   . Restrictive lung disease   . Candida esophagitis   . Hemorrhoids   . Arteriovenous malformation small bowel     Capsule study 3/10  . GI bleeding     Cecal ulcers, arteriovenous malformations  . Chronic diarrhea SEP 2011    ?ETIOLOGY-DIABETIC ENTEROPATHY, LACTOSE INTOLERANCE,  OR IBS-MIXED  . Bloating AUG 2009    NL HBT FOR SIBO  . Anemia FEB 2008 FEDA 2o to AVMs, & large colon ulcers    HB 10.7 MCV 74.3; Dr Tressie Stalker  . BPH (benign prostatic hypertrophy)     Dr [place] Pink  . Glaucoma   . Cataract   . ASCVD (arteriosclerotic cardiovascular disease)     LIMA to LAD and LIMA to intermediate branch, SVG to PD and second OM. 06/03/93  . Bilateral carotid artery stenosis     50-75% external left carotid stenosis  . Coronary atherosclerosis of native coronary artery     Multivessel, BMS SVG TO RCA 2000, reportedly  "2" additional stents in interim  . Myocardial infarction     1994  . Stroke     mini- strokes in past.States has weakness and numbness of right leg. Short term memory loss  . COPD (chronic obstructive pulmonary disease)   .  Sleep apnea   . Portal hypertensive gastropathy 11/08/2012    EGD. Dr. Laural Golden  . AVM (arteriovenous malformation) of colon 10/2012    Cecum & cecal polyps  . External hemorrhoids 10/2012  . Dementia     Past Surgical History  Procedure Laterality Date  . Esophagogastroduodenoscopy  SEP 09    DUODENAL LIPOMA  . Capsule endo  03/10  . Right inguinal hernia repair    . Hydrogen breath test  2009  . Colonoscopy  APR 2008    SIMPLE ADENOMA, [employer] TICS, IH  . Colonoscopy  FEB 2008 ANEMIA. MELENA     2 LARGE ILEOCEAL ULCERS 2o to ASA, [place] TICS, IH  . Colonoscopy  SEP 2009 TRANSFUSION DEP ANEMIA    AC AVM-ABLATED, [place] TICS, IH  . Upper gastrointestinal endoscopy  SEP 2009    GASTRIC AVM  ABLATED, NL DUO Bx  . Cardiac catherization  04/2012  . Cardiac catheterization  04/05/1996 [facility]    EF 45%, occluded SVG to circumflex, patent SVG to D1 and PDA and patent LIMA to LAD with severe native vessel disease.  . Cardiac catheterization  08/22/1998 [facility]    Mild LM, severe LAD,severe CX, occlude RCA, patient  SVG to diatal RCA, patent vein graft to D1 and patent LIMA to LAD.   Marland Kitchen Coronary angioplasty with stent placement  08/22/1998 [name]    TEC stenting of the SVG to RCA-Last seen by cardiologist in 2010.  Marland Kitchen Coronary artery bypass graft  1995-triple bypass    LIMA-LAD, LIMA to intermediate branch, SVG to PD and second OM,  . Cataract extraction w/phaco Right 09/22/2012    Procedure: CATARACT EXTRACTION PHACO AND INTRAOCULAR LENS PLACEMENT (IOC);  Surgeon: Tonny Branch, MD;  Location: [facility];  Service: Ophthalmology;  Laterality: Right;  CDE: 11.43  . Cataract extraction w/phaco Left 10/17/2012    Procedure: CATARACT EXTRACTION PHACO AND INTRAOCULAR LENS PLACEMENT (IOC);  Surgeon: Tonny Branch, MD;  Location: [facility];  Service: Ophthalmology;  Laterality: Left;  CDE: 8.06  . Colonoscopy with esophagogastroduodenoscopy (egd) N/A 11/08/2012    Procedure: COLONOSCOPY WITH ESOPHAGOGASTRODUODENOSCOPY (EGD);  Surgeon: Rogene Houston, MD;  Location: [facility];  Service: Endoscopy;  Laterality: N/A;  250-rescheduled to 7:30 Ann to notify pt  . Givens capsule study N/A 11/24/2012    Procedure: GIVENS CAPSULE STUDY;  Surgeon: Rogene Houston, MD;  Location: [facility];  Service: Endoscopy;  Laterality: N/A;  730    Family History  Problem Relation Age of Onset  . Heart attack Mother   . Heart disease Mother   . Hypertension Mother     Social History:  reports that he has been smoking Cigarettes.  He has a 60 pack-year smoking history. He has never used smokeless tobacco. He reports that he does not drink alcohol or use illicit drugs.  Allergies:  Allergies  Allergen  Reactions  . Penicillins Rash    Medications: I have reviewed the patient's current medications.  Results for orders placed during the hospital encounter of 09/24/13 (from the past 48 hour(s))  CBC WITH DIFFERENTIAL     Status: Abnormal   Collection Time    09/24/13  5:50 PM      Result Value Ref Range   WBC 5.6  4.0 - 10.5 K/uL   RBC 3.24 (*) 4.22 - 5.81 MIL/uL   Hemoglobin 7.1 (*) 13.0 - 17.0 g/dL   HCT 24.4 (*) 39.0 - 52.0 %   MCV 75.3 (*)  78.0 - 100.0 fL   MCH 21.9 (*) 26.0 - 34.0 pg   MCHC 29.1 (*) 30.0 - 36.0 g/dL   RDW 17.2 (*) 11.5 - 15.5 %   Platelets 228  150 - 400 K/uL   Neutrophils Relative % 71  43 - 77 %   Neutro Abs 4.0  1.7 - 7.7 K/uL   Lymphocytes Relative 18  12 - 46 %   Lymphs Abs 1.0  0.7 - 4.0 K/uL   Monocytes Relative 8  3 - 12 %   Monocytes Absolute 0.5  0.1 - 1.0 K/uL   Eosinophils Relative 2  0 - 5 %   Eosinophils Absolute 0.1  0.0 - 0.7 K/uL   Basophils Relative 1  0 - 1 %   Basophils Absolute 0.1  0.0 - 0.1 K/uL  TROPONIN I     Status: None   Collection Time    09/24/13  5:50 PM      Result Value Ref Range   Troponin I <0.30  <0.30 ng/mL   Comment:            Due to the release kinetics of cTnI,     a negative result within the first hours     of the onset of symptoms does not rule out     myocardial infarction with certainty.     If myocardial infarction is still suspected,     repeat the test at appropriate intervals.  PRO B NATRIURETIC PEPTIDE     Status: Abnormal   Collection Time    09/24/13  5:50 PM      Result Value Ref Range   Pro B Natriuretic peptide (BNP) 2129.0 (*) 0 - 125 pg/mL  PROTIME-INR     Status: None   Collection Time    09/24/13  5:50 PM      Result Value Ref Range   Prothrombin Time 13.7  11.6 - 15.2 seconds   INR 1.05  0.00 - 1.49  COMPREHENSIVE METABOLIC PANEL     Status: Abnormal   Collection Time    09/24/13  5:54 PM      Result Value Ref Range   Sodium 142  137 - 147 mEq/L   Potassium 3.2 (*) 3.7 - 5.3  mEq/L   Chloride 103  96 - 112 mEq/L   CO2 26  19 - 32 mEq/L   Glucose, Bld 106 (*) 70 - 99 mg/dL   BUN 16  6 - 23 mg/dL   Creatinine, Ser 0.97  0.50 - 1.35 mg/dL   Calcium 9.2  8.4 - 10.5 mg/dL   Total Protein 7.4  6.0 - 8.3 g/dL   Albumin 3.6  3.5 - 5.2 g/dL   AST 25  0 - 37 U/L   ALT 13  0 - 53 U/L   Alkaline Phosphatase 102  39 - 117 U/L   Total Bilirubin 0.4  0.3 - 1.2 mg/dL   GFR calc non Af Amer 82 (*) >90 mL/min   GFR calc Af Amer >90  >90 mL/min   Comment: (NOTE)     The eGFR has been calculated using the CKD EPI equation.     This calculation has not been validated in all clinical situations.     eGFR's persistently <90 mL/min signify possible Chronic Kidney     Disease.   Anion gap 13  5 - 15  URINALYSIS, ROUTINE W REFLEX MICROSCOPIC     Status: Abnormal   Collection Time  09/24/13  6:39 PM      Result Value Ref Range   Color, Urine AMBER (*) YELLOW   Comment: BIOCHEMICALS MAY BE AFFECTED BY COLOR   APPearance CLEAR  CLEAR   Specific Gravity, Urine 1.015  1.005 - 1.030   pH 6.0  5.0 - 8.0   Glucose, UA NEGATIVE  NEGATIVE mg/dL   Hgb urine dipstick NEGATIVE  NEGATIVE   Bilirubin Urine NEGATIVE  NEGATIVE   Ketones, ur NEGATIVE  NEGATIVE mg/dL   Protein, ur TRACE (*) NEGATIVE mg/dL   Urobilinogen, UA 1.0  0.0 - 1.0 mg/dL   Nitrite NEGATIVE  NEGATIVE   Leukocytes, UA NEGATIVE  NEGATIVE  OCCULT BLOOD, POC DEVICE     Status: None   Collection Time    09/24/13  7:24 PM      Result Value Ref Range   Fecal Occult Bld NEGATIVE  NEGATIVE  TYPE AND SCREEN     Status: None   Collection Time    09/24/13  7:25 PM      Result Value Ref Range   ABO/RH(D) O POS     Antibody Screen NEG     Sample Expiration 09/27/2013     Unit Number [id number]     Blood Component Type RED CELLS,LR     Unit division 00     Status of Unit ISSUED     Transfusion Status OK TO TRANSFUSE     Crossmatch Result COMPATIBLE     Donor AG Type NEGATIVE FOR KIDD A ANTIGEN     Unit Number  [id number]     Blood Component Type RED CELLS,LR     Unit division 00     Status of Unit ALLOCATED     Transfusion Status OK TO TRANSFUSE     Crossmatch Result COMPATIBLE     Donor AG Type NEGATIVE FOR KIDD A ANTIGEN    PREPARE RBC (CROSSMATCH)     Status: None   Collection Time    09/24/13  7:32 PM      Result Value Ref Range   Order Confirmation ORDER PROCESSED BY BLOOD BANK    GLUCOSE, CAPILLARY     Status: Abnormal   Collection Time    09/25/13  7:35 AM      Result Value Ref Range   Glucose-Capillary 113 (*) 70 - 99 mg/dL  COMPREHENSIVE METABOLIC PANEL     Status: Abnormal   Collection Time    09/25/13  9:06 AM      Result Value Ref Range   Sodium 142  137 - 147 mEq/L   Potassium 4.0  3.7 - 5.3 mEq/L   Comment: DELTA CHECK NOTED   Chloride 106  96 - 112 mEq/L   CO2 27  19 - 32 mEq/L   Glucose, Bld 141 (*) 70 - 99 mg/dL   BUN 14  6 - 23 mg/dL   Creatinine, Ser 1.00  0.50 - 1.35 mg/dL   Calcium 8.8  8.4 - 10.5 mg/dL   Total Protein 7.2  6.0 - 8.3 g/dL   Albumin 3.5  3.5 - 5.2 g/dL   AST 21  0 - 37 U/L   ALT 12  0 - 53 U/L   Alkaline Phosphatase 103  39 - 117 U/L   Total Bilirubin 0.9  0.3 - 1.2 mg/dL   GFR calc non Af Amer 74 (*) >90 mL/min   GFR calc Af Amer 86 (*) >90 mL/min   Comment: (NOTE)     The  eGFR has been calculated using the CKD EPI equation.     This calculation has not been validated in all clinical situations.     eGFR's persistently <90 mL/min signify possible Chronic Kidney     Disease.   Anion gap 9  5 - 15  CBC     Status: Abnormal   Collection Time    09/25/13  9:06 AM      Result Value Ref Range   WBC 5.0  4.0 - 10.5 K/uL   RBC 3.50 (*) 4.22 - 5.81 MIL/uL   Hemoglobin 7.9 (*) 13.0 - 17.0 g/dL   HCT 27.1 (*) 39.0 - 52.0 %   MCV 77.4 (*) 78.0 - 100.0 fL   MCH 22.6 (*) 26.0 - 34.0 pg   MCHC 29.2 (*) 30.0 - 36.0 g/dL   RDW 17.1 (*) 11.5 - 15.5 %   Platelets 218  150 - 400 K/uL    Dg Chest 2 View  09/24/2013   CLINICAL DATA:  Shortness  of breath.  Weakness.  EXAM: CHEST  2 VIEW  COMPARISON:  11/15/2012.  FINDINGS: Mildly progressive enlargement of the cardiac silhouette. Stable post CABG changes. Stable prominence of the interstitial markings. Mild residual right pleural thickening. Unremarkable bones. Atheromatous arterial calcifications.  IMPRESSION: 1. No acute abnormality. 2. Mildly progressive cardiomegaly. 3. Stable chronic interstitial lung disease.   Electronically Signed   By: Enrique Sack M.D.   On: 09/24/2013 18:25    ROS Blood pressure 163/77, pulse 58, temperature 98 F (36.7 C), temperature source Oral, resp. rate 20, height '5\' 5"'  (1.651 m), weight 183 lb (83.008 kg), SpO2 99.00%. Physical Exam Alert and oriented. Skin warm and dry. Oral mucosa is moist.   . Sclera anicteric, conjunctivae is pink. Thyroid not enlarged. No cervical lymphadenopathy. Lungs clear. Heart regular rate and rhythm.  Abdomen is soft. Bowel sounds are positive. No hepatomegaly. No abdominal masses felt. No tenderness.  Stool brown and guaiac negative.  Assessment/Plan: Melena. Recurrent GI bleed possibly from small bowel AVMs. Stools x 3 since admission which were all negative.  Discussed with Dr. Laural Golden. Will need an EGD once his breathing has improved.  SETZER,TERRI W 09/25/2013, 1:19 PM   GI attending note; Patient interviewed and examined; As above rectal exam revealed soft brown guaiac-negative stool; his abdominal exam is within normal limits; Will make another attempt to find a source of GI bleed is from small bowel; Will plan another EGD with longer scope in order to examine all of the duodenum and proximal jejunum. If this study is nonrevealing he may have to be referred to [facility] for small bowel endoscopy.

## 2013-09-25 NOTE — Care Management Note (Unsigned)
    Page 1 of 1   09/27/2013     5:15:56 PM CARE MANAGEMENT NOTE 09/27/2013  Patient:  Donald Gaines, Donald Gaines   Account Number:  [id number]  Date Initiated:  09/25/2013  Documentation initiated by:  Vladimir Creeks  Subjective/Objective Assessment:   Admitted with anemia, and having transfusions. pt is from home, lives with sister. States he moved there for assistance when living alone became difficult. He states he will return there at D/C     Action/Plan:   Will monitor for needs   Anticipated DC Date:  09/27/2013   Anticipated DC Plan:  HOME/SELF CARE      DC Planning Services  CM consult      PAC Choice  DURABLE MEDICAL EQUIPMENT   Choice offered to / List presented to:  C-5 Sibling           Status of service:  In process, will continue to follow Medicare Important Message given?   (If response is "NO", the following Medicare IM given date fields will be blank) Date Medicare IM given:   Medicare IM given by:   Date Additional Medicare IM given:   Additional Medicare IM given by:    Discharge Disposition:    Per UR Regulation:  Reviewed for med. necessity/level of care/duration of stay  If discussed at [place] of Stay Meetings, dates discussed:    Comments:  09/27/13 Homer RN/CM Pt returning from EGD and will probable D/C home.May need to for home use. Larena Glassman, RN, would [phone] sets when patient returns. Patient's sister, who makes decisions for her brother,  is in the room and chooses advanced homecare for the 02. Olean Ree will call for the oxygen if  patient qualifies, before he is discharged home. 09/25/13 1300 Vladimir Creeks RN/CM

## 2013-09-25 NOTE — Progress Notes (Signed)
Patient seen and examined. Note reviewed.  Patient has been admitted with recurrent GI bleeding. He has been seen by GI and will likely need endoscopy. He is still somewhat short of breath. On clinical exam, he does have evidence of volume overload with bi-basilar crackles and pedal edema. We'll start the patient on intravenous Lasix. He is also on prednisone, possibly for COPD. We'll continue to follow  Donald Gaines

## 2013-09-26 DIAGNOSIS — I509 Heart failure, unspecified: Secondary | ICD-10-CM

## 2013-09-26 DIAGNOSIS — I5033 Acute on chronic diastolic (congestive) heart failure: Secondary | ICD-10-CM | POA: Diagnosis present

## 2013-09-26 LAB — BASIC METABOLIC PANEL
Anion gap: 9 (ref 5–15)
BUN: 18 mg/dL (ref 6–23)
CALCIUM: 8.7 mg/dL (ref 8.4–10.5)
CHLORIDE: 106 meq/L (ref 96–112)
CO2: 28 mEq/L (ref 19–32)
Creatinine, Ser: 1.03 mg/dL (ref 0.50–1.35)
GFR calc Af Amer: 83 mL/min — ABNORMAL LOW (ref >90)
GFR calc non Af Amer: 72 mL/min — ABNORMAL LOW (ref >90)
GLUCOSE: 114 mg/dL — AB (ref 70–99)
POTASSIUM: 3.8 meq/L (ref 3.7–5.3)
Sodium: 143 mEq/L (ref 137–147)

## 2013-09-26 LAB — URINE CULTURE
CULTURE: NO GROWTH
Colony Count: NO GROWTH

## 2013-09-26 LAB — CBC
HCT: 25 % — ABNORMAL LOW (ref 39.0–52.0)
HEMOGLOBIN: 7.3 g/dL — AB (ref 13.0–17.0)
MCH: 22.5 pg — AB (ref 26.0–34.0)
MCHC: 29.2 g/dL — ABNORMAL LOW (ref 30.0–36.0)
MCV: 76.9 fL — ABNORMAL LOW (ref 78.0–100.0)
Platelets: 216 10*3/uL (ref 150–400)
RBC: 3.25 MIL/uL — ABNORMAL LOW (ref 4.22–5.81)
RDW: 16.9 % — ABNORMAL HIGH (ref 11.5–15.5)
WBC: 6.1 10*3/uL (ref 4.0–10.5)

## 2013-09-26 LAB — FOLATE RBC: RBC FOLATE: 1071 ng/mL — AB (ref >280)

## 2013-09-26 LAB — GLUCOSE, CAPILLARY: GLUCOSE-CAPILLARY: 99 mg/dL (ref 70–99)

## 2013-09-26 LAB — PREPARE RBC (CROSSMATCH)

## 2013-09-26 NOTE — Progress Notes (Signed)
Subjective; Patient reports feeling better. He is able to breathe better but his breathing is not back to normal. His appetite is good. He denies melena or rectal bleeding. Also denies abdominal pain.  Objective; BP 147/65  Pulse 67  Temp(Src) 97.7 F (36.5 C) (Oral)  Resp 20  Ht 5\' 5"  (1.651 m)  Wt 183 lb 9.6 oz (83.28 kg)  BMI 30.55 kg/m2  SpO2 100% Patient is alert. He appears pale. Abdomen is full but soft and nontender without organomegaly or masses. No LE edema noted.  Lab data; WBC 6.7, H&H 7.3 and 25.0, MCV 76.9 and platelet count 216K. Serum iron 12, TIBC 513 and saturation 2% B12 level  412 Red cell folate 1071  Assessment; Anemia secondary to recurrent GI bleed possibly from small bowel AV malformations. He has received single unit of PRBCs but hemoglobin is still low. He is to receive another unit of PRBCs today. He is not actively bleeding and his stool is guaiac negative x2. Patient is on Plavix since increased risk of bleeding. He underwent EGD and colonoscopy in August last year no bleeding lesion was found and was followed by small bowel given capsule study leading to occasional AV malformations without bleeding. Since he had melena prior to admission upper GI tract should be reevaluated along with examination of duodenum and proximal jejunum.  Recommendations; Possible EGD in a.m.

## 2013-09-26 NOTE — Progress Notes (Signed)
[facility] PROGRESS NOTE  Donald Gaines [id number] DOB: [birth year] DOA: 09/24/2013 PCP: Delphina Cahill, MD     Assessment/Plan: 1. Anemia: no further stools. S/p 1 unit of blood with little improvement in HG. Will transfuse 1 unit today and recheck cbc. Evaluated by GI who recommend endoscopy.   2. Diabetes Mellitus Type II:  A1c 6.1. Continue home meds. CBG range 99-167. 3. GERD: Stable: continue with PPI  4. COPD: remains sob likely related to #1. He does not usually wear oxygen. sats 99% on 2L. Continue with inhalers  5. Acute on chronic diastolic congestive heart failure: Provided with IV lasix when evidence of volume overload 09/25/13 with crackles and pedal edema. Urine output 2L since then. Weight 83.2kg down from 89.5kg yesterday. Continue IV lasix.  Continue  imdur, cozaar. Monitor daily weight and intake and output 6. Acute respiratory failure: likely related to #1 and #5. Improved today. Oxygen saturation level 100 on 3L. Will wean oxygen   Code Status: full Family Communication: none present Disposition Plan: home when ready   Consultants:  GI Dr Laural Golden  Procedures:  none  Antibiotics:  none  HPI/Subjective: Sitting on side of be using urinal. Reports breathing "so much better" but complains of not being able to breath with lying down  Objective: Filed Vitals:   09/26/13 0650  BP: 147/65  Pulse:   Temp: 97.7 F (36.5 C)  Resp: 20    Intake/Output Summary (Last 24 hours) at 09/26/13 1307 Last data filed at 09/26/13 1100  Gross per 24 hour  Intake    480 ml  Output   2300 ml  Net  -1820 ml   Filed Weights   09/24/13 2127 09/25/13 1905 09/26/13 0650  Weight: 83.008 kg (183 lb) 89.5 kg (197 lb 5 oz) 83.28 kg (183 lb 9.6 oz)    Exam:   General:  Well nourished NAD  Cardiovascular:  RRR No MG no LE edema  Respiratory: normal effort with conversation BS with improve air flow. Only fine crackles left base no wheeze  Abdomen: soft +BS  non-tender to palpation  Musculoskeletal: no clubbing or cyanosis   Data Reviewed: Basic Metabolic Panel:  Recent Labs Lab 09/24/13 1754 09/25/13 0906 09/26/13 0608  NA 142 142 143  K 3.2* 4.0 3.8  CL 103 106 106  CO2 26 27 28   GLUCOSE 106* 141* 114*  BUN 16 14 18   CREATININE 0.97 1.00 1.03  CALCIUM 9.2 8.8 8.7   Liver Function Tests:  Recent Labs Lab 09/24/13 1754 09/25/13 0906  AST 25 21  ALT 13 12  ALKPHOS 102 103  BILITOT 0.4 0.9  PROT 7.4 7.2  ALBUMIN 3.6 3.5   No results found for this basename: LIPASE, AMYLASE,  in the last 168 hours No results found for this basename: AMMONIA,  in the last 168 hours CBC:  Recent Labs Lab 09/24/13 1750 09/25/13 0906 09/25/13 1402 09/26/13 0608  WBC 5.6 5.0 7.1 6.1  NEUTROABS 4.0  --   --   --   HGB 7.1* 7.9* 7.9* 7.3*  HCT 24.4* 27.1* 26.8* 25.0*  MCV 75.3* 77.4* 77.2* 76.9*  PLT 228 218 226 216   Cardiac Enzymes:  Recent Labs Lab 09/24/13 1750  TROPONINI <0.30   BNP (last 3 results)  Recent Labs  11/15/12 1308 09/24/13 1750  PROBNP 2764.0* 2129.0*   CBG:  Recent Labs Lab 09/25/13 0735 09/25/13 2221 09/26/13 0739  GLUCAP 113* 167* 99    Recent Results (from the past  240 hour(s))  URINE CULTURE     Status: None   Collection Time    09/24/13  6:39 PM      Result Value Ref Range Status   Specimen Description URINE, CLEAN CATCH   Final   Special Requests NONE   Final   Culture  Setup Time     Final   Value: 09/25/2013 13:39     Performed at [place]     Final   Value: NO GROWTH     Performed at [facility]   Culture     Final   Value: NO GROWTH     Performed at [facility]   Report Status 09/26/2013 FINAL   Final     Studies: Dg Chest 2 View  09/24/2013   CLINICAL DATA:  Shortness of breath.  Weakness.  EXAM: CHEST  2 VIEW  COMPARISON:  11/15/2012.  FINDINGS: Mildly progressive enlargement of the cardiac silhouette. Stable post CABG changes.  Stable prominence of the interstitial markings. Mild residual right pleural thickening. Unremarkable bones. Atheromatous arterial calcifications.  IMPRESSION: 1. No acute abnormality. 2. Mildly progressive cardiomegaly. 3. Stable chronic interstitial lung disease.   Electronically Signed   By: Enrique Sack M.D.   On: 09/24/2013 18:25    Scheduled Meds: . antiseptic oral rinse  15 mL Mouth Rinse BID  . atorvastatin  40 mg Oral q1800  . carvedilol  6.25 mg Oral BID WC  . cycloSPORINE  1 drop Both Eyes BID  . DULoxetine  30 mg Oral BID  . dutasteride  0.5 mg Oral q morning - [id number]  . folic acid  1 mg Oral Daily  . furosemide  40 mg Intravenous BID  . isosorbide mononitrate  60 mg Oral Daily  . linagliptin  5 mg Oral Q breakfast   And  . metFORMIN  1,000 mg Oral Q breakfast  . losartan  50 mg Oral q morning - 10a  . mirabegron ER  25 mg Oral Daily  . mometasone-formoterol  2 puff Inhalation BID  . multivitamin with minerals  1 tablet Oral Daily  . pantoprazole  40 mg Oral Daily  . pneumococcal 23 valent vaccine  0.5 mL Intramuscular Tomorrow-1000  . predniSONE  20 mg Oral Daily  . sodium chloride  3 mL Intravenous Q12H  . tamsulosin  0.4 mg Oral q morning - 10a  . thiamine  100 mg Oral Daily   Continuous Infusions: . sodium chloride 10 mL/hr at 09/25/13 1249    Principal Problem:   Anemia Active Problems:   DIABETES MELLITUS   TOBACCO ABUSE   GERD   COPD (chronic obstructive pulmonary disease)    Time spent: 55 mintues    [facility] Pager [phone]. If 7PM-7AM, please contact night-coverage at [email or web address], password [facility] 09/26/2013, 1:07 PM  LOS: 2 days   Attending note:  Patient seen and examined. Note reviewed.  Patient is admitted to the hospital with GI bleeding. He was also found to acute respiratory failure, likely related to acute on chronic diastolic congestive heart failure. He has been started on intravenous Lasix with good diuresis. He  still has evidence of volume overload and will need continued diuresis. He was transfused 1 unit of PRBCs and is currently receiving another unit today. Plans are for endoscopy for GI.  Omni [place]

## 2013-09-27 ENCOUNTER — Encounter (HOSPITAL_COMMUNITY)
Admission: EM | Disposition: A | Discharge status: Home / Self Care | Payer: Self-pay | Source: Home / Self Care | Attending: Internal Medicine

## 2013-09-27 ENCOUNTER — Encounter (HOSPITAL_COMMUNITY): Payer: Self-pay | Admitting: *Deleted

## 2013-09-27 DIAGNOSIS — K766 Portal hypertension: Secondary | ICD-10-CM

## 2013-09-27 DIAGNOSIS — Q2733 Arteriovenous malformation of digestive system vessel: Secondary | ICD-10-CM

## 2013-09-27 HISTORY — PX: HOT HEMOSTASIS: SHX5433

## 2013-09-27 HISTORY — PX: ESOPHAGOGASTRODUODENOSCOPY: SHX5428

## 2013-09-27 LAB — GLUCOSE, CAPILLARY
GLUCOSE-CAPILLARY: 103 mg/dL — AB (ref 70–99)
GLUCOSE-CAPILLARY: 99 mg/dL (ref 70–99)

## 2013-09-27 LAB — BASIC METABOLIC PANEL
Anion gap: 10 (ref 5–15)
BUN: 19 mg/dL (ref 6–23)
CO2: 30 meq/L (ref 19–32)
Calcium: 8.9 mg/dL (ref 8.4–10.5)
Chloride: 103 mEq/L (ref 96–112)
Creatinine, Ser: 1.15 mg/dL (ref 0.50–1.35)
GFR calc Af Amer: 73 mL/min — ABNORMAL LOW (ref >90)
GFR, EST NON AFRICAN AMERICAN: 63 mL/min — AB (ref >90)
GLUCOSE: 107 mg/dL — AB (ref 70–99)
Potassium: 3.4 mEq/L — ABNORMAL LOW (ref 3.7–5.3)
SODIUM: 143 meq/L (ref 137–147)

## 2013-09-27 LAB — CBC
HCT: 27.9 % — ABNORMAL LOW (ref 39.0–52.0)
HEMOGLOBIN: 8.4 g/dL — AB (ref 13.0–17.0)
MCH: 23.2 pg — ABNORMAL LOW (ref 26.0–34.0)
MCHC: 30.1 g/dL (ref 30.0–36.0)
MCV: 77.1 fL — ABNORMAL LOW (ref 78.0–100.0)
Platelets: 219 10*3/uL (ref 150–400)
RBC: 3.62 MIL/uL — AB (ref 4.22–5.81)
RDW: 16.8 % — ABNORMAL HIGH (ref 11.5–15.5)
WBC: 7.7 10*3/uL (ref 4.0–10.5)

## 2013-09-27 LAB — HEMOGLOBIN AND HEMATOCRIT, BLOOD
HCT: 29.7 % — ABNORMAL LOW (ref 39.0–52.0)
Hemoglobin: 9.1 g/dL — ABNORMAL LOW (ref 13.0–17.0)

## 2013-09-27 SURGERY — EGD (ESOPHAGOGASTRODUODENOSCOPY)
Anesthesia: Moderate Sedation

## 2013-09-27 MED ORDER — POTASSIUM CHLORIDE CRYS ER 20 MEQ PO TBCR
40.0000 meq | EXTENDED_RELEASE_TABLET | ORAL | Status: DC
  Administered 2013-09-27: 40 meq via ORAL
  Filled 2013-09-27: qty 2

## 2013-09-27 MED ORDER — MIDAZOLAM HCL 5 MG/5ML IJ SOLN
INTRAMUSCULAR | Status: DC | PRN
  Administered 2013-09-27 (×6): 2 mg via INTRAVENOUS

## 2013-09-27 MED ORDER — MEPERIDINE HCL 50 MG/ML IJ SOLN
INTRAMUSCULAR | Status: DC | PRN
  Administered 2013-09-27 (×2): 25 mg via INTRAVENOUS

## 2013-09-27 MED ORDER — BUTAMBEN-TETRACAINE-BENZOCAINE 2-2-14 % EX AERO
INHALATION_SPRAY | CUTANEOUS | Status: DC | PRN
  Administered 2013-09-27: 2 via TOPICAL

## 2013-09-27 MED ORDER — MIDAZOLAM HCL 5 MG/5ML IJ SOLN
INTRAMUSCULAR | Status: AC
  Filled 2013-09-27: qty 10

## 2013-09-27 MED ORDER — MIDAZOLAM HCL 5 MG/5ML IJ SOLN
INTRAMUSCULAR | Status: AC
  Filled 2013-09-27: qty 5

## 2013-09-27 MED ORDER — MEPERIDINE HCL 50 MG/ML IJ SOLN
INTRAMUSCULAR | Status: DC
Start: 2013-09-27 — End: 2013-09-27
  Filled 2013-09-27: qty 1

## 2013-09-27 MED ORDER — CLOPIDOGREL BISULFATE 75 MG PO TABS: 75.0000 mg | ORAL_TABLET | Freq: Every morning | ORAL | Status: DC

## 2013-09-27 MED ORDER — STERILE WATER FOR IRRIGATION IR SOLN
Status: DC | PRN
  Administered 2013-09-27: 16:00:00

## 2013-09-27 NOTE — Op Note (Signed)
ENTEROSCOPY  PROCEDURE REPORT  PATIENT:  Donald Gaines  MR#:  [id number] Birthdate:  1940-08-16, 73 y.o., male Endoscopist:  Dr. Rogene Houston, MD Referred By:  Dr. Rayne Du ref. provider found Procedure Date: 09/27/2013  Procedure:   Push enteroscopy to 130 cm.  Indications:  Patient is 73 year old Caucasian male with multiple medical problems who presents with recurrent GI bleed anemia and need for transfusion. He was initially evaluated in 2008, and 2009 and more recently in August last year when he underwent EGD colonoscopy and given capsule study. He was found to have 2 jejunal AVMs without stigmata of bleed. It is suspected that he bleeds intermittently from small bowel AV malformations. If these lesions can be reached APC ablation will be performed.            Informed Consent:  The risks, benefits, alternatives & imponderables which include, but are not limited to, bleeding, infection, perforation, drug reaction and potential missed lesion have been reviewed.  The potential for biopsy, lesion removal, esophageal dilation, etc. have also been discussed.  Questions have been answered.  All parties agreeable.  Please see history & physical in medical record for more information.  Medications:  Demerol 50 mg IV Versed 12 mg IV Cetacaine spray topically for oropharyngeal anesthesia  Description of procedure:  The endoscope was introduced through the mouth and advanced to proximal jejunum without difficulty or limitations. The mucosal surfaces were surveyed very carefully during advancement of the scope and upon withdrawal.  Findings:  Esophagus:  Normal mucosa of the esophagus. GE junction unremarkable. GEJ:  42 cm Stomach:  Stomach was empty and distended very well with insufflation. Folds in the proximal stomach were normal. Examination of mucosa at proximal stomach revealed edema and  mosaic pattern. Patchy linear erythema noted antral mucosa but no erosions or ulcers were present.  Pyloric channel was patent. Angularis fundus and cardia were examined by retroflex in the scope and no other abnormalities noted. Duodenum:  Normal examination of the duodenal mucosa. Jejunum: Scope was advanced into proximal jejunum and five AV malformations were identified. Three were punctate and others were larger. None of these lesions were bleeding. All of these were ablated with argon plasma coagulator.  Therapeutic/Diagnostic Maneuvers Performed:  See above  Complications:  None  Impression: Mild portal gastropathy. Five small AV malformations identified in proximal jejunum without stigmata of bleed. All of these lesions were ablated with argon plasma coagulator.  Recommendations:  Advance diet. Posttransfusion H&H. Plavix can be resumed on 10/02/2013. Office visit in 2-3 weeks. Outpatient upper abdominal ultrasound with elastography.  Delina Kruczek U  09/27/2013  4:39 PM  CC: Dr. Delphina Cahill, MD & Dr. Rayne Du ref. provider found

## 2013-09-27 NOTE — Progress Notes (Signed)
Patient c/o of sudden sharp left side chest pain.  Does not radiate.  7/10.  States that he thought it was indigestion this morning.  States that he takes a Nitroglycerin tablet at home when this occurs.  VS see flowsheet.  Dr. Verlon Au on unit and notified.  Agreed to give dose of Nitroglycerin.  If pain persists, obtain stat EKG and troponins.

## 2013-09-27 NOTE — Progress Notes (Signed)
[facility] PROGRESS NOTE  Donald Gaines [id number] DOB: 1940-08-09 DOA: 09/24/2013 PCP: Delphina Cahill, MD  Assessment/Plan: 1. Anemia: no further stools. S/p 2 units of blood and Hg 8.4. Will transfuse one more unit given hx CAD.  Evaluated by GI who recommends transfusion and EGD today and push enteroscopy later.  2. Diabetes Mellitus Type II: A1c 6.1. Continue home meds. CBG range 99-107. 3. GERD: Stable: continue with PPI  4. COPD: dyspnea resolved. He does not usually wear oxygen. sats 99% on 2L. Continue with inhalers. Plan to wean oxygen after transfusion and EGD  5. Acute on chronic diastolic congestive heart failure: Provided with IV lasix. Volume status -2L, Weight 86.7kg up from 83.2kg yesterday. Question reliability of weights. Continue IV lasix at lease one more dose as plan to transfuse 1 unit blood. Continue imdur, cozaar. Monitor daily weight and intake and output Acute respiratory failure: likely related to #1 and #5. Much improve. Oxygen saturation level 97% on 2L. Plan to wean oxygen after EGD and transfusion.   CAD: hx CABG, BMS to SVG to RCA in 2000, bilateral carotid artery stenosis. No chest pain. Chart review indicates echo 8/14 with low EF and severe LVH. Continue coreg, imdur, cozaar and crestor.   Hypokalemia: mild. Likely related to diuresis. Will replete and recheck  Code Status: full Family Communication: none present Disposition Plan: home when ready hopefully tomorrow   Consultants:  GI dr Laural Golden  Procedures:  EGD 09/27/13  Antibiotics:  none  HPI/Subjective: Sitting on side of bed. Reports breathing continues to improve  Objective: Filed Vitals:   09/27/13 0352  BP: 165/83  Pulse: 61  Temp: 97.8 F (36.6 C)  Resp: 20    Intake/Output Summary (Last 24 hours) at 09/27/13 1018 Last data filed at 09/27/13 0656  Gross per 24 hour  Intake 826.67 ml  Output   2200 ml  Net -1373.33 ml   Filed Weights   09/25/13 1905 09/26/13 0650  09/27/13 0352  Weight: 89.5 kg (197 lb 5 oz) 83.28 kg (183 lb 9.6 oz) 86.7 kg (191 lb 2.2 oz)    Exam:   General:  Obese NAD  Cardiovascular: RRR No MGR trace LE edema.   Respiratory: normal effort with conversation. Very faint crackles particularly left base.   Abdomen: obese soft +BS but sluggish non-tender to palpitation  Musculoskeletal: no clubbing or cyanosis   Data Reviewed: Basic Metabolic Panel:  Recent Labs Lab 09/24/13 1754 09/25/13 0906 09/26/13 0608 09/27/13 0551  NA 142 142 143 143  K 3.2* 4.0 3.8 3.4*  CL 103 106 106 103  CO2 26 27 28 30   GLUCOSE 106* 141* 114* 107*  BUN 16 14 18 19   CREATININE 0.97 1.00 1.03 1.15  CALCIUM 9.2 8.8 8.7 8.9   Liver Function Tests:  Recent Labs Lab 09/24/13 1754 09/25/13 0906  AST 25 21  ALT 13 12  ALKPHOS 102 103  BILITOT 0.4 0.9  PROT 7.4 7.2  ALBUMIN 3.6 3.5   No results found for this basename: LIPASE, AMYLASE,  in the last 168 hours No results found for this basename: AMMONIA,  in the last 168 hours CBC:  Recent Labs Lab 09/24/13 1750 09/25/13 0906 09/25/13 1402 09/26/13 0608 09/27/13 0551  WBC 5.6 5.0 7.1 6.1 7.7  NEUTROABS 4.0  --   --   --   --   HGB 7.1* 7.9* 7.9* 7.3* 8.4*  HCT 24.4* 27.1* 26.8* 25.0* 27.9*  MCV 75.3* 77.4* 77.2* 76.9* 77.1*  PLT  228 218 226 216 219   Cardiac Enzymes:  Recent Labs Lab 09/24/13 1750  TROPONINI <0.30   BNP (last 3 results)  Recent Labs  11/15/12 1308 09/24/13 1750  PROBNP 2764.0* 2129.0*   CBG:  Recent Labs Lab 09/25/13 0735 09/25/13 2221 09/26/13 0739 09/27/13 0755  GLUCAP 113* 167* 99 103*    Recent Results (from the past 240 hour(s))  URINE CULTURE     Status: None   Collection Time    09/24/13  6:39 PM      Result Value Ref Range Status   Specimen Description URINE, CLEAN CATCH   Final   Special Requests NONE   Final   Culture  Setup Time     Final   Value: 09/25/2013 13:39     Performed at [place]      Final   Value: NO GROWTH     Performed at [facility]   Culture     Final   Value: NO GROWTH     Performed at [facility]   Report Status 09/26/2013 FINAL   Final     Studies: No results found.  Scheduled Meds: . antiseptic oral rinse  15 mL Mouth Rinse BID  . atorvastatin  40 mg Oral q1800  . carvedilol  6.25 mg Oral BID WC  . cycloSPORINE  1 drop Both Eyes BID  . DULoxetine  30 mg Oral BID  . dutasteride  0.5 mg Oral q morning - [id number]  . folic acid  1 mg Oral Daily  . furosemide  40 mg Intravenous BID  . isosorbide mononitrate  60 mg Oral Daily  . linagliptin  5 mg Oral Q breakfast   And  . metFORMIN  1,000 mg Oral Q breakfast  . losartan  50 mg Oral q morning - 10a  . mirabegron ER  25 mg Oral Daily  . mometasone-formoterol  2 puff Inhalation BID  . multivitamin with minerals  1 tablet Oral Daily  . pantoprazole  40 mg Oral Daily  . pneumococcal 23 valent vaccine  0.5 mL Intramuscular Tomorrow-1000  . predniSONE  20 mg Oral Daily  . sodium chloride  3 mL Intravenous Q12H  . tamsulosin  0.4 mg Oral q morning - 10a  . thiamine  100 mg Oral Daily   Continuous Infusions: . sodium chloride 10 mL/hr at 09/25/13 1249    Principal Problem:   Anemia Active Problems:   DIABETES MELLITUS   TOBACCO ABUSE   GERD   Essential hypertension, benign   Acute respiratory failure with hypoxia   Hypokalemia   COPD (chronic obstructive pulmonary disease)   Acute on chronic diastolic congestive heart failure    Time spent: 35 minutes    [facility] Pager [phone]. If 7PM-7AM, please contact night-coverage at [email or web address], password [facility] 09/27/2013, 10:18 AM  LOS: 3 days

## 2013-09-27 NOTE — Progress Notes (Signed)
Home O2 order faxed to [address] [address] for oxygen delivery this evening.

## 2013-09-27 NOTE — Progress Notes (Signed)
Patient feels much better. He denies shortness of breath chest pain. He also denies melena or rectal bleeding. H&H from this morning is 8.4 and 27.9 Serum potassium 3.4.  Assessment; Recurrent GI bleed with anemia. Patient has received 2 units of PRBCs for hemoglobin is still low at 8.4  Recommendations; EGD and push enteroscopy later. Transfuse another unit of PRBCs.

## 2013-09-27 NOTE — Progress Notes (Signed)
Patient's O2 sats at rest 86-87% on room air.  Patient placed on O2 3L [place] and sats increased to 92-93%.  Patient ambulated in hallway on 3L [place] and sats maintained 92-93%.  Patient returned to room.  Resting in bed with no complaints.

## 2013-09-27 NOTE — Discharge Summary (Addendum)
Physician Discharge Summary  Donald Gaines [id number] DOB: 10-27-1940 DOA: 09/24/2013  PCP: Delphina Cahill, MD  Admit date: 09/24/2013 Discharge date: 09/27/2013  Time spent: 35 minutes  Recommendations for Outpatient Follow-up:  1. Follow up with GI as OP 2. Will need CBC i as well as basic metabolic panel in about one week's  3. Resume Plavix as per gastroenterology on 10/02/13 4. We'll potentially need further endoscopy by gastroenterology as per discussion. 5. Will require home O2 3 L until acute diastolic failure is resolved  Discharge Diagnoses:  Principal Problem:   Anemia Active Problems:   DIABETES MELLITUS   TOBACCO ABUSE   GERD   Essential hypertension, benign   Acute respiratory failure with hypoxia   Hypokalemia   COPD (chronic obstructive pulmonary disease)   Acute on chronic diastolic congestive heart failure   Discharge Condition: Stable  Diet recommendation:   Heart healthy Filed Weights   09/25/13 1905 09/26/13 0650 09/27/13 0352  Weight: 89.5 kg (197 lb 5 oz) 83.28 kg (183 lb 9.6 oz) 86.7 kg (191 lb 2.2 oz)    History of present illness:  73 y/o ?, known h/o CAD BMS 200 [seen in [place] Dr. Shela Leff Robertson], history carotid artery disease, hyperlipidemia, hypertension, diabetes mellitus ty 2, admitted and then hospital with decompensated CHF some mild chest pain as well as dark or tarry stools on 09/24/13  He has been diuresed c lasix 40 IV bid with output ~2 liters and his Cardiac meds have been continued  Gastroenterology was consulted and endoscopy was planned for 09/27/13 and this showed mild portal gastropathy and recommendation for upper abdominal ultrasound last gravity was made by gastroenterology   Hospital Course:  1. Upper Gi bleed casuing acute blood loss anemia: no further stools. S/p 2 units of blood and Hg 8.4. He was transfused a total of 3 units Evaluated by GI EGD performed 09/27/13= AV malformations [full report is pending  ] 2. Diabetes Mellitus Type II: A1c 6.1. Continue home meds of metformin and Linagliptin. CBG range 99-107. 3. GERD: Stable: continue with PPI  4. COPD: dyspnea resolved. He does not usually wear oxygen. sats 99% on 2L but when he walked he decided to 89% L. and needed about 3 L to come back up. Continue with inhalers. He'll be discharged on home O2 and this can be reevaluated as an outpatient.  5. Acute on chronic diastolic congestive heart failure: diuresed c IV lasix. Volume status -2.5 L,Continue IV lasix at lease one more dose as plan to transfuse 1 unit blood. On discharge home was given regular dose Lasix 40 mg daily Continue imdur, cozaar.  6. Acute respiratory failure: likely related to #1 and #5. See discussion above 7. CAD: hx CABG, BMS to SVG to RCA in 2000, bilateral carotid artery stenosis. No chest pain. Chart review indicates echo 8/14 with low EF and severe LVH. Continue coreg, imdur, cozaar and crestor.    Procedures: Upper endoscopy 7/8 Mild portal gastropathy.  Five small AV malformations identified in proximal jejunum without stigmata of bleed.  All of these lesions were ablated with argon plasma coagulato  Consultations:  Gastroenterology  Discharge Exam: Filed Vitals:   09/27/13 1635  BP: 133/60  Pulse: 56  Temp:   Resp: 13    General: Alert pleasant oriented no apparent distress Cardiovascular: S1-S2 no murmur rub or gallop Respiratory: Clinically clear  Discharge Instructions You were cared for by a hospitalist during your hospital stay. If you have any questions about your  discharge medications or the care you received while you were in the hospital after you are discharged, you can call the unit and asked to speak with the hospitalist on call if the hospitalist that took care of you is not available. Once you are discharged, your primary care physician will handle any further medical issues. Please note that NO REFILLS for any discharge medications will be  authorized once you are discharged, as it is imperative that you return to your primary care physician (or establish a relationship with a primary care physician if you do not have one) for your aftercare needs so that they can reassess your need for medications and monitor your lab values.  Discharge Instructions   Diet - low sodium heart healthy    Complete by:  As directed      Discharge instructions    Complete by:  As directed   Restart Plavix on 10/02/13. Dr. Olevia Perches office will be in contact with you regarding follow up and lab-work     Increase activity slowly    Complete by:  As directed             Medication List         ADVAIR DISKUS 250-50 MCG/DOSE Aepb  Generic drug:  Fluticasone-Salmeterol  Inhale 1 puff into the lungs 2 (two) times daily.     ALPRAZolam 0.5 MG tablet  Commonly known as:  XANAX  Take 0.5 mg by mouth 2 (two) times daily as needed for anxiety.     AVODART 0.5 MG capsule  Generic drug:  dutasteride  Take 0.5 mg by mouth every morning.     beta carotene w/minerals tablet  Take 1 tablet by mouth daily.     carvedilol 6.25 MG tablet  Commonly known as:  COREG  Take 6.25 mg by mouth 2 (two) times daily with a meal.     cetirizine 10 MG tablet  Commonly known as:  ZYRTEC  Take 10 mg by mouth every morning.     clopidogrel 75 MG tablet  Commonly known as:  PLAVIX  Take 1 tablet (75 mg total) by mouth every morning.     cycloSPORINE 0.05 % ophthalmic emulsion  Commonly known as:  RESTASIS  Place 1 drop into both eyes 2 (two) times daily.     DULoxetine 30 MG capsule  Commonly known as:  CYMBALTA  Take 30 mg by mouth 2 (two) times daily.     esomeprazole 40 MG capsule  Commonly known as:  NEXIUM  Take 40 mg by mouth daily at 12 noon.     FLOMAX 0.4 MG Caps capsule  Generic drug:  tamsulosin  Take 0.4 mg by mouth every morning. Takes in AM     furosemide 40 MG tablet  Commonly known as:  LASIX  Take 40 mg by mouth daily.      isosorbide mononitrate 60 MG 24 hr tablet  Commonly known as:  IMDUR  Take 1 tablet (60 mg total) by mouth daily.     JANUMET 50-1000 MG per tablet  Generic drug:  sitaGLIPtin-metformin     losartan 50 MG tablet  Commonly known as:  COZAAR  Take 50 mg by mouth every morning.     MYRBETRIQ 25 MG Tb24 tablet  Generic drug:  mirabegron ER  Take 25 mg by mouth daily.     NITROSTAT 0.4 MG SL tablet  Generic drug:  nitroGLYCERIN  Place 0.4 mg under the tongue every 5 (five) minutes as  needed for chest pain.     omeprazole 20 MG capsule  Commonly known as:  PRILOSEC  Take 20 mg by mouth daily.     oxyCODONE-acetaminophen 5-325 MG per tablet  Commonly known as:  PERCOCET/ROXICET  Take 1 tablet by mouth every 6 (six) hours as needed for severe pain.     predniSONE 10 MG tablet  Commonly known as:  DELTASONE  Take 2 tablets (20 mg total) by mouth daily.     PROAIR HFA 108 (90 BASE) MCG/ACT inhaler  Generic drug:  albuterol  Inhale 2 puffs into the lungs every 6 (six) hours as needed for wheezing or shortness of breath.     REFRESH OPTIVE OP  Place 1 drop into the right eye 3 (three) times daily. For both eyes     rosuvastatin 20 MG tablet  Commonly known as:  CRESTOR  Take 20 mg by mouth every morning.     traMADol 50 MG tablet  Commonly known as:  ULTRAM  Take 50 mg by mouth every 8 (eight) hours as needed for pain.       Allergies  Allergen Reactions  . Penicillins Rash      The results of significant diagnostics from this hospitalization (including imaging, microbiology, ancillary and laboratory) are listed below for reference.    Significant Diagnostic Studies: Dg Chest 2 View  09/24/2013   CLINICAL DATA:  Shortness of breath.  Weakness.  EXAM: CHEST  2 VIEW  COMPARISON:  11/15/2012.  FINDINGS: Mildly progressive enlargement of the cardiac silhouette. Stable post CABG changes. Stable prominence of the interstitial markings. Mild residual right pleural thickening.  Unremarkable bones. Atheromatous arterial calcifications.  IMPRESSION: 1. No acute abnormality. 2. Mildly progressive cardiomegaly. 3. Stable chronic interstitial lung disease.   Electronically Signed   By: Enrique Sack M.D.   On: 09/24/2013 18:25    Microbiology: Recent Results (from the past 240 hour(s))  URINE CULTURE     Status: None   Collection Time    09/24/13  6:39 PM      Result Value Ref Range Status   Specimen Description URINE, CLEAN CATCH   Final   Special Requests NONE   Final   Culture  Setup Time     Final   Value: 09/25/2013 13:39     Performed at [facility]     Final   Value: NO GROWTH     Performed at [facility]   Culture     Final   Value: NO GROWTH     Performed at [facility]   Report Status 09/26/2013 FINAL   Final     Labs: Basic Metabolic Panel:  Recent Labs Lab 09/24/13 1754 09/25/13 0906 09/26/13 0608 09/27/13 0551  NA 142 142 143 143  K 3.2* 4.0 3.8 3.4*  CL 103 106 106 103  CO2 26 27 28 30   GLUCOSE 106* 141* 114* 107*  BUN 16 14 18 19   CREATININE 0.97 1.00 1.03 1.15  CALCIUM 9.2 8.8 8.7 8.9   Liver Function Tests:  Recent Labs Lab 09/24/13 1754 09/25/13 0906  AST 25 21  ALT 13 12  ALKPHOS 102 103  BILITOT 0.4 0.9  PROT 7.4 7.2  ALBUMIN 3.6 3.5   No results found for this basename: LIPASE, AMYLASE,  in the last 168 hours No results found for this basename: AMMONIA,  in the last 168 hours CBC:  Recent Labs Lab 09/24/13 1750 09/25/13 0906 09/25/13  1402 09/26/13 0608 09/27/13 0551  WBC 5.6 5.0 7.1 6.1 7.7  NEUTROABS 4.0  --   --   --   --   HGB 7.1* 7.9* 7.9* 7.3* 8.4*  HCT 24.4* 27.1* 26.8* 25.0* 27.9*  MCV 75.3* 77.4* 77.2* 76.9* 77.1*  PLT 228 218 226 216 219   Cardiac Enzymes:  Recent Labs Lab 09/24/13 1750  TROPONINI <0.30   BNP: BNP (last 3 results)  Recent Labs  11/15/12 1308 09/24/13 1750  PROBNP 2764.0* 2129.0*   CBG:  Recent Labs Lab 09/25/13 0735  09/25/13 2221 09/26/13 0739 09/27/13 0755 09/27/13 1452  GLUCAP 113* 167* 99 103* 99       Signed:  Tomia Enlow, [place]  [facility] 09/27/2013, 4:49 PM

## 2013-09-27 NOTE — Progress Notes (Signed)
73 y/o ?, known h/o CAD BMS 200 [seen in [place] Dr. Shela Leff Robertson], history really mouth admission, carotid artery disease, hyperlipidemia, hypertension, diabetes mellitus ty 2, admitted and then hospital with decompensated CHF some mild chest pain as well as dark or tarry stools on 09/24/13  He has been diuresed c lasix 40 IV bid with output ~2 liters and his Cardiac meds have been continued  Gastroenterology was consulted and endoscopy was planned for 09/27/13  He is doing fair. He had a couple episodes of chest pain earlier today which are fleeting and subsequently was given nitroglycerin. He states he has had once again no further stools and is being transfused as we speak. He has no fever no chills no nausea no vomiting Dyspnea this resolved Unclear etiology of anemia as this is now his third unit We will defer decision-making subsequent to endoscopy and hopefully we can stop there.  continue diuresis 40 Lasix twice a day  Repeat basic metabolic panel as well a CBC in the am Rest as above   Verneita Griffes, MD [facility] [phone]

## 2013-09-28 LAB — TYPE AND SCREEN
ABO/RH(D): O POS
Antibody Screen: NEGATIVE
DONOR AG TYPE: NEGATIVE
Donor AG Type: NEGATIVE
Donor AG Type: NEGATIVE
Unit division: 0
Unit division: 0
Unit division: 0

## 2013-09-28 NOTE — Progress Notes (Signed)
Late entry 09/27/13 - AVS reviewed with patient.  Verbalized understanding of discharge medications, physician follow-up and medications.  Patient's IV removed.  Site WNL.  [facility] in transit to deliver portable oxygen tank for transport home.  Patient stable and awaiting portable oxygen tank at shift change.  Reports all belongings intact and in possession.

## 2013-09-28 NOTE — Progress Notes (Signed)
UR chart review completed.  

## 2013-10-02 ENCOUNTER — Encounter (HOSPITAL_COMMUNITY): Payer: Self-pay | Admitting: Internal Medicine

## 2013-11-17 ENCOUNTER — Telehealth: Payer: Self-pay | Admitting: Cardiology

## 2013-11-17 ENCOUNTER — Other Ambulatory Visit: Payer: Self-pay

## 2013-11-17 MED ORDER — CARVEDILOL 6.25 MG PO TABS: 6.2500 mg | ORAL_TABLET | Freq: Two times a day (BID) | ORAL | Status: DC

## 2013-11-17 NOTE — Telephone Encounter (Signed)
Patient wants to know what his "heart pill" is / tgs

## 2013-11-17 NOTE — Telephone Encounter (Signed)
Discussed with pt that he is on Coreg,has fu sept 14 th with Dr.McDowell

## 2013-11-21 ENCOUNTER — Emergency Department (HOSPITAL_COMMUNITY): Payer: Medicare HMO

## 2013-11-21 ENCOUNTER — Observation Stay (HOSPITAL_COMMUNITY)
Admission: EM | Admit: 2013-11-21 | Discharge: 2013-11-23 | Disposition: A | Discharge status: Home / Self Care | Payer: Medicare HMO | Attending: Family Medicine | Admitting: Family Medicine

## 2013-11-21 ENCOUNTER — Encounter (HOSPITAL_COMMUNITY): Payer: Self-pay | Admitting: Emergency Medicine

## 2013-11-21 DIAGNOSIS — K219 Gastro-esophageal reflux disease without esophagitis: Secondary | ICD-10-CM | POA: Insufficient documentation

## 2013-11-21 DIAGNOSIS — Q2733 Arteriovenous malformation of digestive system vessel: Secondary | ICD-10-CM | POA: Insufficient documentation

## 2013-11-21 DIAGNOSIS — Z880 Allergy status to penicillin: Secondary | ICD-10-CM | POA: Insufficient documentation

## 2013-11-21 DIAGNOSIS — Z862 Personal history of diseases of the blood and blood-forming organs and certain disorders involving the immune mechanism: Secondary | ICD-10-CM | POA: Diagnosis not present

## 2013-11-21 DIAGNOSIS — I252 Old myocardial infarction: Secondary | ICD-10-CM | POA: Insufficient documentation

## 2013-11-21 DIAGNOSIS — Z79899 Other long term (current) drug therapy: Secondary | ICD-10-CM | POA: Insufficient documentation

## 2013-11-21 DIAGNOSIS — J4489 Other specified chronic obstructive pulmonary disease: Secondary | ICD-10-CM | POA: Insufficient documentation

## 2013-11-21 DIAGNOSIS — M7989 Other specified soft tissue disorders: Secondary | ICD-10-CM | POA: Diagnosis not present

## 2013-11-21 DIAGNOSIS — I7090 Unspecified atherosclerosis: Secondary | ICD-10-CM | POA: Insufficient documentation

## 2013-11-21 DIAGNOSIS — F329 Major depressive disorder, single episode, unspecified: Secondary | ICD-10-CM | POA: Insufficient documentation

## 2013-11-21 DIAGNOSIS — R4701 Aphasia: Secondary | ICD-10-CM | POA: Diagnosis not present

## 2013-11-21 DIAGNOSIS — R079 Chest pain, unspecified: Secondary | ICD-10-CM | POA: Diagnosis present

## 2013-11-21 DIAGNOSIS — I4819 Other persistent atrial fibrillation: Secondary | ICD-10-CM

## 2013-11-21 DIAGNOSIS — K922 Gastrointestinal hemorrhage, unspecified: Secondary | ICD-10-CM | POA: Insufficient documentation

## 2013-11-21 DIAGNOSIS — Z8673 Personal history of transient ischemic attack (TIA), and cerebral infarction without residual deficits: Secondary | ICD-10-CM | POA: Insufficient documentation

## 2013-11-21 DIAGNOSIS — I658 Occlusion and stenosis of other precerebral arteries: Secondary | ICD-10-CM | POA: Insufficient documentation

## 2013-11-21 DIAGNOSIS — F17200 Nicotine dependence, unspecified, uncomplicated: Secondary | ICD-10-CM | POA: Diagnosis not present

## 2013-11-21 DIAGNOSIS — I6529 Occlusion and stenosis of unspecified carotid artery: Secondary | ICD-10-CM | POA: Diagnosis not present

## 2013-11-21 DIAGNOSIS — IMO0002 Reserved for concepts with insufficient information to code with codable children: Secondary | ICD-10-CM | POA: Insufficient documentation

## 2013-11-21 DIAGNOSIS — F3289 Other specified depressive episodes: Secondary | ICD-10-CM | POA: Insufficient documentation

## 2013-11-21 DIAGNOSIS — Z9849 Cataract extraction status, unspecified eye: Secondary | ICD-10-CM | POA: Insufficient documentation

## 2013-11-21 DIAGNOSIS — R0789 Other chest pain: Secondary | ICD-10-CM | POA: Diagnosis not present

## 2013-11-21 DIAGNOSIS — J449 Chronic obstructive pulmonary disease, unspecified: Secondary | ICD-10-CM | POA: Diagnosis not present

## 2013-11-21 DIAGNOSIS — H409 Unspecified glaucoma: Secondary | ICD-10-CM | POA: Diagnosis not present

## 2013-11-21 DIAGNOSIS — F0390 Unspecified dementia without behavioral disturbance: Secondary | ICD-10-CM | POA: Insufficient documentation

## 2013-11-21 DIAGNOSIS — Z7902 Long term (current) use of antithrombotics/antiplatelets: Secondary | ICD-10-CM | POA: Diagnosis not present

## 2013-11-21 DIAGNOSIS — I10 Essential (primary) hypertension: Secondary | ICD-10-CM | POA: Diagnosis present

## 2013-11-21 DIAGNOSIS — E119 Type 2 diabetes mellitus without complications: Secondary | ICD-10-CM | POA: Diagnosis not present

## 2013-11-21 DIAGNOSIS — N400 Enlarged prostate without lower urinary tract symptoms: Secondary | ICD-10-CM | POA: Diagnosis not present

## 2013-11-21 DIAGNOSIS — E785 Hyperlipidemia, unspecified: Secondary | ICD-10-CM | POA: Diagnosis not present

## 2013-11-21 DIAGNOSIS — I4891 Unspecified atrial fibrillation: Secondary | ICD-10-CM | POA: Diagnosis present

## 2013-11-21 DIAGNOSIS — K319 Disease of stomach and duodenum, unspecified: Secondary | ICD-10-CM | POA: Diagnosis not present

## 2013-11-21 DIAGNOSIS — I2510 Atherosclerotic heart disease of native coronary artery without angina pectoris: Secondary | ICD-10-CM | POA: Diagnosis not present

## 2013-11-21 DIAGNOSIS — Z8619 Personal history of other infectious and parasitic diseases: Secondary | ICD-10-CM | POA: Diagnosis not present

## 2013-11-21 LAB — BASIC METABOLIC PANEL
Anion gap: 11 (ref 5–15)
BUN: 11 mg/dL (ref 6–23)
CO2: 28 mEq/L (ref 19–32)
Calcium: 8.9 mg/dL (ref 8.4–10.5)
Chloride: 102 mEq/L (ref 96–112)
Creatinine, Ser: 1.05 mg/dL (ref 0.50–1.35)
GFR calc Af Amer: 81 mL/min — ABNORMAL LOW (ref >90)
GFR, EST NON AFRICAN AMERICAN: 70 mL/min — AB (ref >90)
GLUCOSE: 132 mg/dL — AB (ref 70–99)
Potassium: 3.3 mEq/L — ABNORMAL LOW (ref 3.7–5.3)
Sodium: 141 mEq/L (ref 137–147)

## 2013-11-21 LAB — CBC WITH DIFFERENTIAL/PLATELET
BASOS PCT: 1 % (ref 0–1)
Basophils Absolute: 0.1 10*3/uL (ref 0.0–0.1)
EOS ABS: 0.2 10*3/uL (ref 0.0–0.7)
Eosinophils Relative: 4 % (ref 0–5)
HCT: 31.4 % — ABNORMAL LOW (ref 39.0–52.0)
HEMOGLOBIN: 9.8 g/dL — AB (ref 13.0–17.0)
Lymphocytes Relative: 20 % (ref 12–46)
Lymphs Abs: 1.1 10*3/uL (ref 0.7–4.0)
MCH: 26.3 pg (ref 26.0–34.0)
MCHC: 31.2 g/dL (ref 30.0–36.0)
MCV: 84.2 fL (ref 78.0–100.0)
Monocytes Absolute: 0.6 10*3/uL (ref 0.1–1.0)
Monocytes Relative: 10 % (ref 3–12)
NEUTROS ABS: 3.5 10*3/uL (ref 1.7–7.7)
Neutrophils Relative %: 65 % (ref 43–77)
Platelets: 243 10*3/uL (ref 150–400)
RBC: 3.73 MIL/uL — ABNORMAL LOW (ref 4.22–5.81)
RDW: 21.2 % — AB (ref 11.5–15.5)
WBC: 5.5 10*3/uL (ref 4.0–10.5)

## 2013-11-21 LAB — TROPONIN I
Troponin I: <0.3 ng/mL (ref <0.30)
Troponin I: <0.3 ng/mL (ref <0.30)

## 2013-11-21 LAB — PROTIME-INR
INR: 1.07 (ref 0.00–1.49)
Prothrombin Time: 13.9 seconds (ref 11.6–15.2)

## 2013-11-21 LAB — PRO B NATRIURETIC PEPTIDE: Pro B Natriuretic peptide (BNP): 4018 pg/mL — ABNORMAL HIGH (ref 0–125)

## 2013-11-21 LAB — GLUCOSE, CAPILLARY: GLUCOSE-CAPILLARY: 102 mg/dL — AB (ref 70–99)

## 2013-11-21 MED ORDER — NITROGLYCERIN 0.4 MG SL SUBL: 0.4000 mg | SUBLINGUAL_TABLET | SUBLINGUAL | Status: DC | PRN

## 2013-11-21 MED ORDER — MOMETASONE FURO-FORMOTEROL FUM 100-5 MCG/ACT IN AERO
2.0000 | INHALATION_SPRAY | Freq: Two times a day (BID) | RESPIRATORY_TRACT | Status: DC
  Administered 2013-11-22 – 2013-11-23 (×3): 2 via RESPIRATORY_TRACT
  Filled 2013-11-21: qty 8.8

## 2013-11-21 MED ORDER — PANTOPRAZOLE SODIUM 40 MG PO TBEC
40.0000 mg | DELAYED_RELEASE_TABLET | Freq: Every day | ORAL | Status: DC
  Administered 2013-11-22 – 2013-11-23 (×2): 40 mg via ORAL
  Filled 2013-11-21 (×2): qty 1

## 2013-11-21 MED ORDER — TAMSULOSIN HCL 0.4 MG PO CAPS
0.4000 mg | ORAL_CAPSULE | Freq: Every morning | ORAL | Status: DC
  Administered 2013-11-22 – 2013-11-23 (×2): 0.4 mg via ORAL
  Filled 2013-11-21 (×2): qty 1

## 2013-11-21 MED ORDER — ONDANSETRON HCL 4 MG/2ML IJ SOLN
4.0000 mg | Freq: Four times a day (QID) | INTRAMUSCULAR | Status: DC | PRN
  Administered 2013-11-22: 4 mg via INTRAVENOUS
  Filled 2013-11-21: qty 2

## 2013-11-21 MED ORDER — POLYVINYL ALCOHOL 1.4 % OP SOLN
3.0000 [drp] | Freq: Three times a day (TID) | OPHTHALMIC | Status: DC
  Administered 2013-11-22 – 2013-11-23 (×6): 3 [drp] via OPHTHALMIC
  Filled 2013-11-21 (×2): qty 15

## 2013-11-21 MED ORDER — LEVALBUTEROL HCL 0.63 MG/3ML IN NEBU
0.6300 mg | INHALATION_SOLUTION | Freq: Once | RESPIRATORY_TRACT | Status: AC
  Administered 2013-11-21: 0.63 mg via RESPIRATORY_TRACT
  Filled 2013-11-21: qty 3

## 2013-11-21 MED ORDER — ALBUTEROL SULFATE (2.5 MG/3ML) 0.083% IN NEBU
2.5000 mg | INHALATION_SOLUTION | Freq: Four times a day (QID) | RESPIRATORY_TRACT | Status: DC | PRN
  Filled 2013-11-21: qty 3

## 2013-11-21 MED ORDER — METFORMIN HCL 500 MG PO TABS
1000.0000 mg | ORAL_TABLET | Freq: Two times a day (BID) | ORAL | Status: DC
  Administered 2013-11-22 (×2): 1000 mg via ORAL
  Filled 2013-11-21 (×2): qty 2

## 2013-11-21 MED ORDER — ONDANSETRON HCL 4 MG PO TABS: 4.0000 mg | ORAL_TABLET | Freq: Four times a day (QID) | ORAL | Status: DC | PRN

## 2013-11-21 MED ORDER — MIRABEGRON ER 25 MG PO TB24
25.0000 mg | ORAL_TABLET | Freq: Every day | ORAL | Status: DC
  Administered 2013-11-22 – 2013-11-23 (×2): 25 mg via ORAL
  Filled 2013-11-21 (×4): qty 1

## 2013-11-21 MED ORDER — ASPIRIN 81 MG PO CHEW
324.0000 mg | CHEWABLE_TABLET | Freq: Once | ORAL | Status: AC
  Administered 2013-11-21: 324 mg via ORAL
  Filled 2013-11-21: qty 4

## 2013-11-21 MED ORDER — DULOXETINE HCL 30 MG PO CPEP
30.0000 mg | ORAL_CAPSULE | Freq: Two times a day (BID) | ORAL | Status: DC
  Administered 2013-11-22 – 2013-11-23 (×4): 30 mg via ORAL
  Filled 2013-11-21 (×8): qty 1

## 2013-11-21 MED ORDER — ISOSORBIDE MONONITRATE ER 60 MG PO TB24
60.0000 mg | ORAL_TABLET | Freq: Every day | ORAL | Status: DC
  Administered 2013-11-22 – 2013-11-23 (×2): 60 mg via ORAL
  Filled 2013-11-21 (×4): qty 1

## 2013-11-21 MED ORDER — ALBUTEROL SULFATE HFA 108 (90 BASE) MCG/ACT IN AERS: 2.0000 | INHALATION_SPRAY | Freq: Four times a day (QID) | RESPIRATORY_TRACT | Status: DC | PRN

## 2013-11-21 MED ORDER — SODIUM CHLORIDE 0.9 % IJ SOLN
3.0000 mL | Freq: Two times a day (BID) | INTRAMUSCULAR | Status: DC
  Administered 2013-11-22 (×2): 3 mL via INTRAVENOUS
  Administered 2013-11-23: 10 mL via INTRAVENOUS

## 2013-11-21 MED ORDER — INSULIN ASPART 100 UNIT/ML ~~LOC~~ SOLN: 0.0000 [IU] | Freq: Three times a day (TID) | SUBCUTANEOUS | Status: DC

## 2013-11-21 MED ORDER — TRAMADOL HCL 50 MG PO TABS
50.0000 mg | ORAL_TABLET | Freq: Three times a day (TID) | ORAL | Status: DC | PRN
Start: 2013-11-21 — End: 2013-11-23
  Administered 2013-11-22 – 2013-11-23 (×2): 50 mg via ORAL
  Filled 2013-11-21 (×2): qty 1

## 2013-11-21 MED ORDER — OCUVITE PO TABS
1.0000 | ORAL_TABLET | Freq: Every day | ORAL | Status: DC
  Administered 2013-11-22 – 2013-11-23 (×2): 1 via ORAL
  Filled 2013-11-21 (×3): qty 1

## 2013-11-21 MED ORDER — DUTASTERIDE 0.5 MG PO CAPS
0.5000 mg | ORAL_CAPSULE | Freq: Every morning | ORAL | Status: DC
  Administered 2013-11-22 – 2013-11-23 (×2): 0.5 mg via ORAL
  Filled 2013-11-21 (×3): qty 1

## 2013-11-21 MED ORDER — CARVEDILOL 3.125 MG PO TABS
6.2500 mg | ORAL_TABLET | Freq: Two times a day (BID) | ORAL | Status: DC
  Administered 2013-11-22: 6.25 mg via ORAL
  Filled 2013-11-21: qty 2
  Filled 2013-11-21 (×3): qty 1

## 2013-11-21 MED ORDER — LORATADINE 10 MG PO TABS
10.0000 mg | ORAL_TABLET | Freq: Every day | ORAL | Status: DC
  Administered 2013-11-22 – 2013-11-23 (×2): 10 mg via ORAL
  Filled 2013-11-21 (×2): qty 1

## 2013-11-21 MED ORDER — LINAGLIPTIN 5 MG PO TABS
5.0000 mg | ORAL_TABLET | Freq: Two times a day (BID) | ORAL | Status: DC
  Administered 2013-11-22 (×2): 5 mg via ORAL
  Filled 2013-11-21 (×5): qty 1

## 2013-11-21 MED ORDER — FUROSEMIDE 10 MG/ML IJ SOLN
40.0000 mg | Freq: Once | INTRAMUSCULAR | Status: AC
  Administered 2013-11-21: 40 mg via INTRAVENOUS
  Filled 2013-11-21: qty 4

## 2013-11-21 MED ORDER — NITROGLYCERIN 0.4 MG SL SUBL
0.4000 mg | SUBLINGUAL_TABLET | SUBLINGUAL | Status: AC | PRN
  Administered 2013-11-21 – 2013-11-22 (×3): 0.4 mg via SUBLINGUAL
  Filled 2013-11-21 (×2): qty 1

## 2013-11-21 MED ORDER — HEPARIN SODIUM (PORCINE) 5000 UNIT/ML IJ SOLN
5000.0000 [IU] | Freq: Three times a day (TID) | INTRAMUSCULAR | Status: DC
Start: 2013-11-21 — End: 2013-11-23
  Administered 2013-11-22 – 2013-11-23 (×6): 5000 [IU] via SUBCUTANEOUS
  Filled 2013-11-21 (×6): qty 1

## 2013-11-21 MED ORDER — CYCLOSPORINE 0.05 % OP EMUL
1.0000 [drp] | Freq: Two times a day (BID) | OPHTHALMIC | Status: DC
  Administered 2013-11-22 – 2013-11-23 (×3): 1 [drp] via OPHTHALMIC
  Filled 2013-11-21 (×6): qty 1

## 2013-11-21 MED ORDER — LOSARTAN POTASSIUM 50 MG PO TABS
50.0000 mg | ORAL_TABLET | Freq: Every morning | ORAL | Status: DC
  Administered 2013-11-22 – 2013-11-23 (×2): 50 mg via ORAL
  Filled 2013-11-21 (×4): qty 1

## 2013-11-21 MED ORDER — FUROSEMIDE 40 MG PO TABS
40.0000 mg | ORAL_TABLET | Freq: Every day | ORAL | Status: DC
  Administered 2013-11-22 – 2013-11-23 (×2): 40 mg via ORAL
  Filled 2013-11-21 (×2): qty 1

## 2013-11-21 MED ORDER — ATORVASTATIN CALCIUM 40 MG PO TABS
40.0000 mg | ORAL_TABLET | Freq: Every day | ORAL | Status: DC
  Filled 2013-11-21: qty 1

## 2013-11-21 MED ORDER — ALPRAZOLAM 0.5 MG PO TABS: 0.5000 mg | ORAL_TABLET | Freq: Two times a day (BID) | ORAL | Status: DC | PRN

## 2013-11-21 MED ORDER — POTASSIUM CHLORIDE CRYS ER 20 MEQ PO TBCR
40.0000 meq | EXTENDED_RELEASE_TABLET | Freq: Once | ORAL | Status: AC
  Administered 2013-11-21: 40 meq via ORAL
  Filled 2013-11-21: qty 2

## 2013-11-21 MED ORDER — SITAGLIPTIN PHOS-METFORMIN HCL 50-1000 MG PO TABS: 1.0000 | ORAL_TABLET | Freq: Two times a day (BID) | ORAL | Status: DC

## 2013-11-21 MED ORDER — CLOPIDOGREL BISULFATE 75 MG PO TABS
75.0000 mg | ORAL_TABLET | Freq: Every morning | ORAL | Status: DC
  Administered 2013-11-22 – 2013-11-23 (×2): 75 mg via ORAL
  Filled 2013-11-21 (×2): qty 1

## 2013-11-21 MED ORDER — INSULIN ASPART 100 UNIT/ML ~~LOC~~ SOLN: 0.0000 [IU] | Freq: Every day | SUBCUTANEOUS | Status: DC

## 2013-11-21 NOTE — H&P (Signed)
[facility] History and Physical  Donald Gaines [id number] DOB: 1940/10/15 DOA: 11/21/2013  Referring physician: ER PCP: Delphina Cahill, MD   Chief Complaint: Left-sided chest pain  HPI: Donald Gaines is a 73 y.o. male  This is a 73 year old man who is a difficult historian. It appears that for the last 12-18 hours, he has had left-sided chest pain without radiation that has improved with nitroglycerin in the emergency room. He also had an episode of dyspnea, nausea and diaphoresis with it. On one occasion he describes his tongue being twisted in his mouth with the chest pain. This lasted several minutes and there was no other neurological symptoms. He does have a history of coronary artery disease, having had CABG in 1995 and further surgery in 2000. He also recently has been given diagnosis of dementia.  Review of Systems:  Constitutional:  No weight loss, night sweats, Fevers, chills, fatigue.  HEENT:  No headaches, Difficulty swallowing,Tooth/dental problems,Sore throat,  No sneezing, itching, ear ache, nasal congestion, post nasal drip,   GI:  No heartburn, indigestion, abdominal pain, nausea, vomiting, diarrhea, change in bowel habits, loss of appetite  Resp:  No shortness of breath with exertion or at rest. No excess mucus, no productive cough, No non-productive cough, No coughing up of blood.No change in color of mucus.No wheezing.No chest wall deformity  Skin:  no rash or lesions.  GU:  no dysuria, change in color of urine, no urgency or frequency. No flank pain.  Musculoskeletal:  No joint pain or swelling. No decreased range of motion. No back pain.  Psych:  No change in mood or affect. No depression or anxiety. No memory loss.   Past Medical History  Diagnosis Date  . TIA (transient ischemic attack)     2005  . GERD (gastroesophageal reflux disease)   . Hyperlipidemia   . Hypertension   . Depression   . Diabetes mellitus, type 2   . Restrictive lung  disease   . Candida esophagitis   . Hemorrhoids   . Arteriovenous malformation small bowel     Capsule study 3/10  . GI bleeding     Cecal ulcers, arteriovenous malformations  . Chronic diarrhea SEP 2011    ?ETIOLOGY-DIABETIC ENTEROPATHY, LACTOSE INTOLERANCE,  OR IBS-MIXED  . Bloating AUG 2009    NL HBT FOR SIBO  . Anemia FEB 2008 FEDA 2o to AVMs, & large colon ulcers    HB 10.7 MCV 74.3; Dr Tressie Stalker  . BPH (benign prostatic hypertrophy)     Dr Maryland Pink  . Glaucoma   . Cataract   . ASCVD (arteriosclerotic cardiovascular disease)     LIMA to LAD and LIMA to intermediate branch, SVG to PD and second OM. 06/03/93  . Bilateral carotid artery stenosis     50-75% external left carotid stenosis  . Coronary atherosclerosis of native coronary artery     Multivessel, BMS SVG TO RCA 2000, reportedly  "2" additional stents in interim  . Myocardial infarction     1994  . Stroke     mini- strokes in past.States has weakness and numbness of right leg. Short term memory loss  . COPD (chronic obstructive pulmonary disease)   . Sleep apnea   . Portal hypertensive gastropathy 11/08/2012    EGD. Dr. Laural Golden  . AVM (arteriovenous malformation) of colon 10/2012    Cecum & cecal polyps  . External hemorrhoids 10/2012  . Dementia    Past Surgical History  Procedure Laterality Date  . Esophagogastroduodenoscopy  SEP 09    DUODENAL LIPOMA  . Capsule endo  03/10  . Right inguinal hernia repair    . Hydrogen breath test  2009  . Colonoscopy  APR 2008    SIMPLE ADENOMA, [place] TICS, IH  . Colonoscopy  FEB 2008 ANEMIA. [facility]LENA     2 LARGE ILEOCEAL ULCERS 2o to ASA, [place] TICS, IH  . Colonoscopy  SEP 2009 TRANSFUSION DEP ANEMIA    AC AVM-ABLATED, [place] TICS, IH  . Upper gastrointestinal endoscopy  SEP 2009    GASTRIC AVM ABLATED, NL DUO Bx  . Cardiac catherization  04/2012  . Cardiac catheterization  04/05/1996 [facility]    EF 45%, occluded SVG to circumflex, patent SVG to D1 and PDA and patent LIMA to LAD  with severe native vessel disease.  . Cardiac catheterization  08/22/1998 [facility]    Mild LM, severe LAD,severe CX, occlude RCA, patient  SVG to diatal RCA, patent vein graft to D1 and patent LIMA to LAD.   Marland Kitchen Coronary angioplasty with stent placement  08/22/1998 [name]    TEC stenting of the SVG to RCA-Last seen by cardiologist in 2010.  Marland Kitchen Coronary artery bypass graft  1995-triple bypass    LIMA-LAD, LIMA to intermediate branch, SVG to PD and second OM,  . Cataract extraction w/phaco Right 09/22/2012    Procedure: CATARACT EXTRACTION PHACO AND INTRAOCULAR LENS PLACEMENT (IOC);  Surgeon: Tonny Branch, MD;  Location: [facility];  Service: Ophthalmology;  Laterality: Right;  CDE: 11.43  . Cataract extraction w/phaco Left 10/17/2012    Procedure: CATARACT EXTRACTION PHACO AND INTRAOCULAR LENS PLACEMENT (IOC);  Surgeon: Tonny Branch, MD;  Location: [facility];  Service: Ophthalmology;  Laterality: Left;  CDE: 8.06  . Colonoscopy with esophagogastroduodenoscopy (egd) N/A 11/08/2012    Procedure: COLONOSCOPY WITH ESOPHAGOGASTRODUODENOSCOPY (EGD);  Surgeon: Rogene Houston, MD;  Location: [facility];  Service: Endoscopy;  Laterality: N/A;  250-rescheduled to 7:30 Ann to notify pt  . Givens capsule study N/A 11/24/2012    Procedure: GIVENS CAPSULE STUDY;  Surgeon: Rogene Houston, MD;  Location: [facility];  Service: Endoscopy;  Laterality: N/A;  730  . Esophagogastroduodenoscopy N/A 09/27/2013    Procedure: ESOPHAGOGASTRODUODENOSCOPY (EGD) Push enteroscopy;  Surgeon: Rogene Houston, MD;  Location: [facility];  Service: Endoscopy;  Laterality: N/A;  . Hot hemostasis  09/27/2013    Procedure: HOT HEMOSTASIS (ARGON PLASMA COAGULATION/BICAP);  Surgeon: Rogene Houston, MD;  Location: [facility];  Service: Endoscopy;;   Social History:  reports that he has been smoking Cigarettes.  He has a 60 pack-year smoking history. He has never used smokeless tobacco. He reports that he does not drink alcohol or use  illicit drugs.  Allergies  Allergen Reactions  . Penicillins Rash    Family History  Problem Relation Age of Onset  . Heart attack Mother   . Heart disease Mother   . Hypertension Mother      Prior to Admission medications   Medication Sig Start Date End Date Taking? Authorizing Provider  ADVAIR DISKUS 250-50 MCG/DOSE AEPB Inhale 1 puff into the lungs 2 (two) times daily.  10/02/10  Yes Historical Provider, MD  albuterol (PROAIR HFA) 108 (90 BASE) MCG/ACT inhaler Inhale 2 puffs into the lungs every 6 (six) hours as needed for wheezing or shortness of breath.   Yes Historical Provider, MD  ALPRAZolam Duanne Moron) 0.5 MG tablet Take 0.5 mg by mouth 2 (two) times daily as needed for anxiety.  Yes Historical Provider, MD  AVODART 0.5 MG capsule Take 0.5 mg by mouth every morning.  11/21/12  Yes Historical Provider, MD  beta carotene w/minerals (OCUVITE) tablet Take 1 tablet by mouth daily.   Yes Historical Provider, MD  Carboxymethylcellul-Glycerin (REFRESH OPTIVE OP) Place 1 drop into the right eye 3 (three) times daily. For both eyes   Yes Historical Provider, MD  carvedilol (COREG) 6.25 MG tablet Take 1 tablet (6.25 mg total) by mouth 2 (two) times daily with a meal. 11/17/13  Yes Satira Sark, MD  cetirizine (ZYRTEC) 10 MG tablet Take 10 mg by mouth every morning.    Yes Historical Provider, MD  clopidogrel (PLAVIX) 75 MG tablet Take 1 tablet (75 mg total) by mouth every morning. 09/27/13  Yes Nita Sells, MD  cycloSPORINE (RESTASIS) 0.05 % ophthalmic emulsion Place 1 drop into both eyes 2 (two) times daily.   Yes Historical Provider, MD  DULoxetine (CYMBALTA) 30 MG capsule Take 30 mg by mouth 2 (two) times daily.   Yes Historical Provider, MD  furosemide (LASIX) 40 MG tablet Take 40 mg by mouth daily.    Yes Historical Provider, MD  isosorbide mononitrate (IMDUR) 60 MG 24 hr tablet Take 1 tablet (60 mg total) by mouth daily. 06/01/13  Yes Lendon Colonel, NP  JANUMET 50-1000 MG  per tablet Take 1 tablet by mouth 2 (two) times daily with a meal.  07/22/13  Yes Historical Provider, MD  losartan (COZAAR) 50 MG tablet Take 50 mg by mouth every morning.  09/23/10  Yes Historical Provider, MD  NITROSTAT 0.4 MG SL tablet Place 0.4 mg under the tongue every 5 (five) minutes as needed for chest pain.  05/23/10  Yes Historical Provider, MD  omeprazole (PRILOSEC) 20 MG capsule Take 20 mg by mouth daily.  08/28/13  Yes Historical Provider, MD  rosuvastatin (CRESTOR) 20 MG tablet Take 20 mg by mouth every morning.    Yes Historical Provider, MD  Tamsulosin HCl (FLOMAX) 0.4 MG CAPS Take 0.4 mg by mouth every morning. Takes in AM   Yes Historical Provider, MD  mirabegron ER (MYRBETRIQ) 25 MG TB24 tablet Take 25 mg by mouth daily.    Historical Provider, MD  traMADol (ULTRAM) 50 MG tablet Take 50 mg by mouth every 8 (eight) hours as needed for pain.    Historical Provider, MD   Physical Exam: Filed Vitals:   11/21/13 2030 11/21/13 2034 11/21/13 2036 11/21/13 2100  BP: 132/93   126/88  Pulse: 82   81  Temp:      TempSrc:      Resp: 14   16  Height:      Weight:      SpO2: 96% 100% 100% 100%    Wt Readings from Last 3 Encounters:  11/21/13 79.379 kg (175 lb)  09/27/13 86.7 kg (191 lb 2.2 oz)  09/27/13 86.7 kg (191 lb 2.2 oz)    General:  Appears calm and comfortable Eyes: PERRL, normal lids, irises & conjunctiva ENT: grossly normal hearing, lips & tongue Neck: no LAD, masses or thyromegaly Cardiovascular: Irregularly irregular. Telemetry: Atrial fibrillation. Respiratory: CTA bilaterally, no w/r/r. Normal respiratory effort. Abdomen: soft, ntnd Skin: no rash or induration seen on limited exam Musculoskeletal: grossly normal tone BUE/BLE. He does appear to have some tenderness in the anterior chest wall on the left more than the right, reproducing his pain. Psychiatric: grossly normal mood and affect, speech fluent and appropriate Neurologic: grossly non-focal.  Labs  on Admission:  Basic Metabolic Panel:  Recent Labs Lab 11/21/13 2014  NA 141  K 3.3*  CL 102  CO2 28  GLUCOSE 132*  BUN 11  CREATININE 1.05  CALCIUM 8.9   Liver Function Tests: No results found for this basename: AST, ALT, ALKPHOS, BILITOT, PROT, ALBUMIN,  in the last 168 hours No results found for this basename: LIPASE, AMYLASE,  in the last 168 hours No results found for this basename: AMMONIA,  in the last 168 hours CBC:  Recent Labs Lab 11/21/13 2014  WBC 5.5  NEUTROABS 3.5  HGB 9.8*  HCT 31.4*  MCV 84.2  PLT 243   Cardiac Enzymes:  Recent Labs Lab 11/21/13 2014  TROPONINI <0.30    BNP (last 3 results)  Recent Labs  09/24/13 1750 11/21/13 2014  PROBNP 2129.0* 4018.0*   CBG: No results found for this basename: GLUCAP,  in the last 168 hours  Radiological Exams on Admission: Dg Chest 2 View  11/21/2013   CLINICAL DATA:  Chest pain, history COPD, diabetes, hypertension, coronary artery disease post MI, stenting and CABG  EXAM: CHEST  2 VIEW  COMPARISON:  09/24/2013  FINDINGS: Enlargement of cardiac silhouette post median sternotomy.  Pulmonary vascular congestion.  COPD changes with RIGHT basilar atelectasis.  Remaining lungs grossly clear.  No pleural effusion or pneumothorax.  Bones demineralized.  IMPRESSION: COPD changes with RIGHT basilar atelectasis.  Enlargement of cardiac silhouette post median sternotomy.   Electronically Signed   By: Lavonia Dana M.D.   On: 11/21/2013 21:47   Ct Head Wo Contrast  11/21/2013   CLINICAL DATA:  73 year old male with episode of aphasia. Altered mental status. Initial encounter.  EXAM: CT HEAD WITHOUT CONTRAST  TECHNIQUE: Contiguous axial images were obtained from the base of the skull through the vertex without intravenous contrast.  COMPARISON:  12/21/2005.  FINDINGS: Chronic left maxillary sinusitis. Stable visualized paranasal sinuses and mastoids. No acute osseous abnormality identified.  Interval postoperative changes  to the globes. No acute scalp soft tissue findings.  Calcified atherosclerosis at the skull base. Mild generalized cerebral volume loss since 2007. Chronic but increased subcortical white matter hypodensity in the right hemisphere tracking toward the right external capsule. Chronic heterogeneity of the deep gray matter nuclei elsewhere not significantly changed. No evidence of cortically based acute infarction identified. No suspicious intracranial vascular hyperdensity. No midline shift, mass effect, or evidence of intracranial mass lesion. No acute intracranial hemorrhage identified.  IMPRESSION: No acute intracranial abnormality. Progressed chronic small vessel disease since 2007.   Electronically Signed   By: Lars Pinks M.D.   On: 11/21/2013 21:29    EKG: Independently reviewed. Atrial fibrillation without any acute ST-T wave changes.  Assessment/Plan   1. Atypical chest pain. 2. Atrial fibrillation, appears to be new. 3. Hypertension. 4. Diabetes mellitus.  Plan: 1. Admit to telemetry. 2. Serial cardiac enzymes. 3. Echocardiogram. 4. Cardiology consultation. 5. Monitor and control diabetes.  Further recommendations will depend on patient's hospital progress.   Code Status: Full code  DVT Prophylaxis: Heparin.  Family Communication: I discussed the plan with the patient at the bedside.   Disposition Plan: Home when medically stable.   Time spent: 60 minutes.  Doree Albee [facility] Pager [phone].  **Disclaimer: This note may have been dictated with voice recognition software. Similar sounding words can inadvertently be transcribed and this note may contain transcription errors which may not have been corrected upon publication of note.**

## 2013-11-21 NOTE — ED Provider Notes (Signed)
TIME SEEN: 8:10 PM  CHIEF COMPLAINT: Chest pain, difficulty speaking  HPI: Patient is a 73 year old male with history of hypertension, hyperlipidemia, diabetes, COPD on 2 L of oxygen chronically, coronary artery disease status post CABG who presents emergency department with intermittent left-sided chest pressure without radiation that improved with nitroglycerin. He did have an episode today was associated shortness of breath, nausea, diaphoresis and dizziness. He improved spontaneously but has not completely gone. During this episode of chest pain he had an episode where he felt like he could not talk. States he felt like "my tongue got twisted up inside my mouth". He states this lasted for several minutes. No numbness, tingling or focal weakness. No headache. No facial droop that he is aware of. Patient is unsure when his last cardiac catheterization or stress test was. He is on Plavix. He also reports he has been in several days of swelling in his bilateral lower extremities. No calf pain.  PCP is Dr. Nevada Crane  Cardiologist is with [facility]   ROS: See HPI Constitutional: no fever  Eyes: no drainage  ENT: no runny nose   Cardiovascular:   chest pain  Resp:  SOB  GI: no vomiting GU: no dysuria Integumentary: no rash  Allergy: no hives  Musculoskeletal: no leg swelling  Neurological: no slurred speech ROS otherwise negative  PAST MEDICAL HISTORY/PAST SURGICAL HISTORY:  Past Medical History  Diagnosis Date  . TIA (transient ischemic attack)     2005  . GERD (gastroesophageal reflux disease)   . Hyperlipidemia   . Hypertension   . Depression   . Diabetes mellitus, type 2   . Restrictive lung disease   . Candida esophagitis   . Hemorrhoids   . Arteriovenous malformation small bowel     Capsule study 3/10  . GI bleeding     Cecal ulcers, arteriovenous malformations  . Chronic diarrhea SEP 2011    ?ETIOLOGY-DIABETIC ENTEROPATHY, LACTOSE INTOLERANCE,  OR IBS-MIXED  . Bloating AUG  2009    NL HBT FOR SIBO  . Anemia FEB 2008 FEDA 2o to AVMs, & large colon ulcers    HB 10.7 MCV 74.3; Dr Tressie Stalker  . BPH (benign prostatic hypertrophy)     Dr Maryland Pink  . Glaucoma   . Cataract   . ASCVD (arteriosclerotic cardiovascular disease)     LIMA to LAD and LIMA to intermediate branch, SVG to PD and second OM. 06/03/93  . Bilateral carotid artery stenosis     50-75% external left carotid stenosis  . Coronary atherosclerosis of native coronary artery     Multivessel, BMS SVG TO RCA 2000, reportedly  "2" additional stents in interim  . Myocardial infarction     1994  . Stroke     mini- strokes in past.States has weakness and numbness of right leg. Short term memory loss  . COPD (chronic obstructive pulmonary disease)   . Sleep apnea   . Portal hypertensive gastropathy 11/08/2012    EGD. Dr. Laural Golden  . AVM (arteriovenous malformation) of colon 10/2012    Cecum & cecal polyps  . External hemorrhoids 10/2012  . Dementia     MEDICATIONS:  Prior to Admission medications   Medication Sig Start Date End Date Taking? Authorizing Provider  ADVAIR DISKUS 250-50 MCG/DOSE AEPB Inhale 1 puff into the lungs 2 (two) times daily.  10/02/10   Historical Provider, MD  albuterol (PROAIR HFA) 108 (90 BASE) MCG/ACT inhaler Inhale 2 puffs into the lungs every 6 (six) hours as needed for wheezing  or shortness of breath.    Historical Provider, MD  ALPRAZolam Duanne Moron) 0.5 MG tablet Take 0.5 mg by mouth 2 (two) times daily as needed for anxiety.     Historical Provider, MD  AVODART 0.5 MG capsule Take 0.5 mg by mouth every morning.  11/21/12   Historical Provider, MD  beta carotene w/minerals (OCUVITE) tablet Take 1 tablet by mouth daily.    Historical Provider, MD  Carboxymethylcellul-Glycerin (REFRESH OPTIVE OP) Place 1 drop into the right eye 3 (three) times daily. For both eyes    Historical Provider, MD  carvedilol (COREG) 6.25 MG tablet Take 1 tablet (6.25 mg total) by mouth 2 (two) times daily with a  meal. 11/17/13   Satira Sark, MD  cetirizine (ZYRTEC) 10 MG tablet Take 10 mg by mouth every morning.     Historical Provider, MD  clopidogrel (PLAVIX) 75 MG tablet Take 1 tablet (75 mg total) by mouth every morning. 09/27/13   Nita Sells, MD  cycloSPORINE (RESTASIS) 0.05 % ophthalmic emulsion Place 1 drop into both eyes 2 (two) times daily.    Historical Provider, MD  DULoxetine (CYMBALTA) 30 MG capsule Take 30 mg by mouth 2 (two) times daily.    Historical Provider, MD  esomeprazole (NEXIUM) 40 MG capsule Take 40 mg by mouth daily at 12 noon.    Historical Provider, MD  furosemide (LASIX) 40 MG tablet Take 40 mg by mouth daily.     Historical Provider, MD  isosorbide mononitrate (IMDUR) 60 MG 24 hr tablet Take 1 tablet (60 mg total) by mouth daily. 06/01/13   Lendon Colonel, NP  JANUMET 50-1000 MG per tablet  07/22/13   Historical Provider, MD  losartan (COZAAR) 50 MG tablet Take 50 mg by mouth every morning.  09/23/10   Historical Provider, MD  mirabegron ER (MYRBETRIQ) 25 MG TB24 tablet Take 25 mg by mouth daily.    Historical Provider, MD  NITROSTAT 0.4 MG SL tablet Place 0.4 mg under the tongue every 5 (five) minutes as needed for chest pain.  05/23/10   Historical Provider, MD  omeprazole (PRILOSEC) 20 MG capsule Take 20 mg by mouth daily.  08/28/13   Historical Provider, MD  oxyCODONE-acetaminophen (PERCOCET/ROXICET) 5-325 MG per tablet Take 1 tablet by mouth every 6 (six) hours as needed for severe pain. 07/13/13   Maudry Diego, MD  predniSONE (DELTASONE) 10 MG tablet Take 2 tablets (20 mg total) by mouth daily. 07/13/13   Maudry Diego, MD  rosuvastatin (CRESTOR) 20 MG tablet Take 20 mg by mouth every morning.     Historical Provider, MD  Tamsulosin HCl (FLOMAX) 0.4 MG CAPS Take 0.4 mg by mouth every morning. Takes in AM    Historical Provider, MD  traMADol (ULTRAM) 50 MG tablet Take 50 mg by mouth every 8 (eight) hours as needed for pain.    Historical Provider, MD     ALLERGIES:  Allergies  Allergen Reactions  . Penicillins Rash    SOCIAL HISTORY:  History  Substance Use Topics  . Smoking status: Current Every Day Smoker -- 1.00 packs/day for 60 years    Types: Cigarettes  . Smokeless tobacco: Never Used     Comment: 1 pack a day  . Alcohol Use: No     Comment: No etoh in 12 yrs.    FAMILY HISTORY: Family History  Problem Relation Age of Onset  . Heart attack Mother   . Heart disease Mother   . Hypertension Mother  EXAM: BP 154/93  Pulse 94  Temp(Src) 98 F (36.7 C) (Oral)  Resp 18  Ht 5\' 5"  (1.651 m)  Wt 175 lb (79.379 kg)  BMI 29.12 kg/m2  SpO2 98% CONSTITUTIONAL: Alert and oriented and responds appropriately to questions. Well-appearing; well-nourished HEAD: Normocephalic EYES: Conjunctivae clear, PERRL ENT: normal nose; no rhinorrhea; moist mucous membranes; pharynx without lesions noted NECK: Supple, no meningismus, no LAD  CARD: RRR; S1 and S2 appreciated; no murmurs, no clicks, no rubs, no gallops RESP: Normal chest excursion without splinting or tachypnea; breath sounds equal bilaterally, there is diffuse rhonchi, no wheezing or rales, no respiratory distress, no hypoxia, speaking full sentences ABD/GI: Normal bowel sounds; non-distended; soft, non-tender, no rebound, no guarding BACK:  The back appears normal and is non-tender to palpation, there is no CVA tenderness EXT: Normal ROM in all joints; non-tender to palpation; patient does have mild edema of bilateral lower extremities, no pitting edema; normal capillary refill; no cyanosis; no calf tenderness    SKIN: Normal color for age and race; warm NEURO: Moves all extremities equally; sensation to light touch intact diffusely, cranial nerves II through XII intact PSYCH: The patient's mood and manner are appropriate. Grooming and personal hygiene are appropriate.  MEDICAL DECISION MAKING: Patient here with chest pain and an episode of akinesia. Concern for  possible anginal equivalent and TIA. Will obtain cardiac labs, chest x-ray and head CT. Will give aspirin  and nitroglycerin. I feel he will need admission.  ED PROGRESS: Patient's labs show an elevated BNP of 4000. Troponin negative. Chest x-ray shows volume overload compared to prior. He has stable cardiomegaly. Head CT negative. We'll give IV Lasix given he does appear volume overload on exam. Will discuss with hospitalist for admission.  D/w Anastasio Champion for admission to tele, obs.    EKG Interpretation  Date/Time:  Tuesday November 21 2013 19:50:26 EDT Ventricular Rate:  91 PR Interval:    QRS Duration: 101 QT Interval:  382 QTC Calculation: 470 R Axis:   34 Text Interpretation:  Atrial fibrillation Ventricular premature complex Repol abnrm suggests ischemia, anterolateral Baseline wander in lead(s) V3 Confirmed by Mearle Drew,  DO, Mansour Balboa [phone]) on 11/21/2013 8:49:39 PM        [place], DO 11/21/13 2159

## 2013-11-21 NOTE — ED Notes (Signed)
Chest pain that started yesterday and I took 3 nitro per pt. Had chest pain today, but have not taken nitro. Having swelling in my legs and feel it in my face. Had one episode of being unable to talk.

## 2013-11-22 ENCOUNTER — Ambulatory Visit (INDEPENDENT_AMBULATORY_CARE_PROVIDER_SITE_OTHER): Payer: Medicare HMO | Admitting: Internal Medicine

## 2013-11-22 DIAGNOSIS — I4891 Unspecified atrial fibrillation: Secondary | ICD-10-CM

## 2013-11-22 DIAGNOSIS — R0789 Other chest pain: Principal | ICD-10-CM

## 2013-11-22 DIAGNOSIS — I319 Disease of pericardium, unspecified: Secondary | ICD-10-CM

## 2013-11-22 DIAGNOSIS — I7090 Unspecified atherosclerosis: Secondary | ICD-10-CM

## 2013-11-22 DIAGNOSIS — I4819 Other persistent atrial fibrillation: Secondary | ICD-10-CM

## 2013-11-22 DIAGNOSIS — I2510 Atherosclerotic heart disease of native coronary artery without angina pectoris: Secondary | ICD-10-CM

## 2013-11-22 LAB — CBC
HEMATOCRIT: 28.4 % — AB (ref 39.0–52.0)
HEMOGLOBIN: 8.7 g/dL — AB (ref 13.0–17.0)
MCH: 25.8 pg — ABNORMAL LOW (ref 26.0–34.0)
MCHC: 30.6 g/dL (ref 30.0–36.0)
MCV: 84.3 fL (ref 78.0–100.0)
Platelets: 218 10*3/uL (ref 150–400)
RBC: 3.37 MIL/uL — AB (ref 4.22–5.81)
RDW: 21.2 % — ABNORMAL HIGH (ref 11.5–15.5)
WBC: 5.6 10*3/uL (ref 4.0–10.5)

## 2013-11-22 LAB — GLUCOSE, CAPILLARY
GLUCOSE-CAPILLARY: 105 mg/dL — AB (ref 70–99)
GLUCOSE-CAPILLARY: 105 mg/dL — AB (ref 70–99)
Glucose-Capillary: 131 mg/dL — ABNORMAL HIGH (ref 70–99)
Glucose-Capillary: 133 mg/dL — ABNORMAL HIGH (ref 70–99)

## 2013-11-22 LAB — COMPREHENSIVE METABOLIC PANEL
ALBUMIN: 3.1 g/dL — AB (ref 3.5–5.2)
ALT: 6 U/L (ref 0–53)
ANION GAP: 10 (ref 5–15)
AST: 16 U/L (ref 0–37)
Alkaline Phosphatase: 84 U/L (ref 39–117)
BUN: 10 mg/dL (ref 6–23)
CALCIUM: 8.6 mg/dL (ref 8.4–10.5)
CO2: 30 mEq/L (ref 19–32)
Chloride: 104 mEq/L (ref 96–112)
Creatinine, Ser: 1.02 mg/dL (ref 0.50–1.35)
GFR calc non Af Amer: 72 mL/min — ABNORMAL LOW (ref >90)
GFR, EST AFRICAN AMERICAN: 84 mL/min — AB (ref >90)
GLUCOSE: 99 mg/dL (ref 70–99)
POTASSIUM: 3.8 meq/L (ref 3.7–5.3)
Sodium: 144 mEq/L (ref 137–147)
TOTAL PROTEIN: 6.6 g/dL (ref 6.0–8.3)
Total Bilirubin: 0.4 mg/dL (ref 0.3–1.2)

## 2013-11-22 LAB — TSH: TSH: 8.83 u[IU]/mL — AB (ref 0.350–4.500)

## 2013-11-22 LAB — TROPONIN I: Troponin I: <0.3 ng/mL (ref <0.30)

## 2013-11-22 MED ORDER — MORPHINE SULFATE 2 MG/ML IJ SOLN
2.0000 mg | INTRAMUSCULAR | Status: DC | PRN
  Administered 2013-11-22: 2 mg via INTRAVENOUS
  Filled 2013-11-22: qty 1

## 2013-11-22 MED ORDER — ATORVASTATIN CALCIUM 40 MG PO TABS
80.0000 mg | ORAL_TABLET | Freq: Every day | ORAL | Status: DC
  Administered 2013-11-22: 80 mg via ORAL
  Filled 2013-11-22: qty 2

## 2013-11-22 MED ORDER — CARVEDILOL 12.5 MG PO TABS
12.5000 mg | ORAL_TABLET | Freq: Two times a day (BID) | ORAL | Status: DC
  Administered 2013-11-22 – 2013-11-23 (×2): 12.5 mg via ORAL
  Filled 2013-11-22 (×2): qty 1

## 2013-11-22 NOTE — Progress Notes (Signed)
UR Completed.  Draycen Leichter Jane [phone] 11/22/2013  

## 2013-11-22 NOTE — Progress Notes (Signed)
Pt c/o chest pain coming back at this time. Rates the pain 5/10. Vital signs done: BP 130/66 HR 125 Temp 98.2 Oxygen 92% on Room Air. Placing 2L [place] on now. Telemetry called nurse notifying of multiple PVC's occuring often. Nurse notified Dr Darrick Meigs of all findings. Dr Darrick Meigs ordered 2mg  Morphine O4H PRN Chest Pain. Will administer this immediately. Pt laying in bed at lowest position & call bell is within reach.

## 2013-11-22 NOTE — Consult Note (Signed)
Primary cardiologist: Dr Rozann Lesches Consulting cardiologist: Dr Carlyle Dolly  Clinical Summary Donald Gaines is a 73 y.o.male hx of CAD with prior CABG and prior stenting post-CABG primarily in [place], carotid stenosis, HTN, hyperlipidemia, DM, hx of recurrent GI bleeds with small bowel AVMs and chronic anemia with prior transfusions, dementia, admitted with chest pain.   Describes 2 episodes of chest pain. First episode 2 days ago while at rest. 10/10 pressure in chest with + SOB, + palpitations. Somewhat worst with deep breaths. Somewhat better with NG. Lasted approx 3 hours. Intermittent palpitations throughout that episode Repeat episode yesterday while at rest, similar to previous. Lasted several hours. This episode was associated with unclear neuro symptoms, "twisting of my tongue and face". Also notes some increased LE edema over the last several days.   - Last cath 02/2012 LM distal 30-40%, LAD 80% prox, LCX occluded, OM1 patent RCA occluded SVG-ramus patent, SVG-RCA occluded, LIMA-LAD patent. LVEF 65-70% by LV gram.  - 10/2012 echo LVEF low normal technically difficult study.  - trop neg x3, Cr 1.02, GFR 72, K 3.8, Hgb 8.7, Plt 218, BNP 4018 - EKG afib, chronic TWI anterolateral precordial leads.  - CXR COPD changes, cardiomegaly - CT head no acute process Allergies  Allergen Reactions  . Penicillins Rash    Medications Scheduled Medications: . atorvastatin  40 mg Oral q1800  . beta carotene w/minerals  1 tablet Oral Daily  . carvedilol  6.25 mg Oral BID WC  . clopidogrel  75 mg Oral q morning - 10a  . cycloSPORINE  1 drop Both Eyes BID  . DULoxetine  30 mg Oral BID  . dutasteride  0.5 mg Oral q morning - 10a  . furosemide  40 mg Oral Daily  . heparin  5,000 Units Subcutaneous 3 times per day  . insulin aspart  0-15 Units Subcutaneous TID WC  . insulin aspart  0-5 Units Subcutaneous QHS  . isosorbide mononitrate  60 mg Oral Daily  . linagliptin  5 mg  Oral BID WC   And  . metFORMIN  1,000 mg Oral BID WC  . loratadine  10 mg Oral Daily  . losartan  50 mg Oral q morning - 10a  . mirabegron ER  25 mg Oral Daily  . mometasone-formoterol  2 puff Inhalation BID  . pantoprazole  40 mg Oral Daily  . polyvinyl alcohol  3 drop Right Eye TID  . sodium chloride  3 mL Intravenous Q12H  . tamsulosin  0.4 mg Oral q morning - 10a     Infusions:     PRN Medications:  albuterol, ALPRAZolam, morphine injection, ondansetron (ZOFRAN) IV, ondansetron, traMADol   Past Medical History  Diagnosis Date  . TIA (transient ischemic attack)     2005  . GERD (gastroesophageal reflux disease)   . Hyperlipidemia   . Hypertension   . Depression   . Diabetes mellitus, type 2   . Restrictive lung disease   . Candida esophagitis   . Hemorrhoids   . Arteriovenous malformation small bowel     Capsule study 3/10  . GI bleeding     Cecal ulcers, arteriovenous malformations  . Chronic diarrhea SEP 2011    ?ETIOLOGY-DIABETIC ENTEROPATHY, LACTOSE INTOLERANCE,  OR IBS-MIXED  . Bloating AUG 2009    NL HBT FOR SIBO  . Anemia FEB 2008 FEDA 2o to AVMs, & large colon ulcers    HB 10.7 MCV 74.3; Dr Tressie Stalker  . BPH (benign prostatic hypertrophy)  Dr [place] Pink  . Glaucoma   . Cataract   . ASCVD (arteriosclerotic cardiovascular disease)     LIMA to LAD and LIMA to intermediate Jakub Debold, SVG to PD and second OM. 06/03/93  . Bilateral carotid artery stenosis     50-75% external left carotid stenosis  . Coronary atherosclerosis of native coronary artery     Multivessel, BMS SVG TO RCA 2000, reportedly  "2" additional stents in interim  . Myocardial infarction     1994  . Stroke     mini- strokes in past.States has weakness and numbness of right leg. Short term memory loss  . COPD (chronic obstructive pulmonary disease)   . Sleep apnea   . Portal hypertensive gastropathy 11/08/2012    EGD. Dr. Laural Golden  . AVM (arteriovenous malformation) of colon 10/2012     Cecum & cecal polyps  . External hemorrhoids 10/2012  . Dementia     Past Surgical History  Procedure Laterality Date  . Esophagogastroduodenoscopy  SEP 09    DUODENAL LIPOMA  . Capsule endo  03/10  . Right inguinal hernia repair    . Hydrogen breath test  2009  . Colonoscopy  APR 2008    SIMPLE ADENOMA, [place] TICS, IH  . Colonoscopy  FEB 2008 ANEMIA. MELENA     2 LARGE ILEOCEAL ULCERS 2o to ASA, [place] TICS, IH  . Colonoscopy  SEP 2009 TRANSFUSION DEP ANEMIA    AC AVM-ABLATED, [place] TICS, IH  . Upper gastrointestinal endoscopy  SEP 2009    GASTRIC AVM ABLATED, NL DUO Bx  . Cardiac catherization  04/2012  . Cardiac catheterization  04/05/1996 [facility]    EF 45%, occluded SVG to circumflex, patent SVG to D1 and PDA and patent LIMA to LAD with severe native vessel disease.  . Cardiac catheterization  08/22/1998 [facility]    Mild LM, severe LAD,severe CX, occlude RCA, patient  SVG to diatal RCA, patent vein graft to D1 and patent LIMA to LAD.   Marland Kitchen Coronary angioplasty with stent placement  08/22/1998 [name]    TEC stenting of the SVG to RCA-Last seen by cardiologist in 2010.  Marland Kitchen Coronary artery bypass graft  1995-triple bypass    LIMA-LAD, LIMA to intermediate Elda Dunkerson, SVG to PD and second OM,  . Cataract extraction w/phaco Right 09/22/2012    Procedure: CATARACT EXTRACTION PHACO AND INTRAOCULAR LENS PLACEMENT (IOC);  Surgeon: Tonny Kateline Kinkade, MD;  Location: [facility];  Service: Ophthalmology;  Laterality: Right;  CDE: 11.43  . Cataract extraction w/phaco Left 10/17/2012    Procedure: CATARACT EXTRACTION PHACO AND INTRAOCULAR LENS PLACEMENT (IOC);  Surgeon: Tonny Kiamesha Samet, MD;  Location: [facility];  Service: Ophthalmology;  Laterality: Left;  CDE: 8.06  . Colonoscopy with esophagogastroduodenoscopy (egd) N/A 11/08/2012    Procedure: COLONOSCOPY WITH ESOPHAGOGASTRODUODENOSCOPY (EGD);  Surgeon: Rogene Houston, MD;  Location: [facility];  Service: Endoscopy;  Laterality: N/A;  250-rescheduled to 7:30 Ann  to notify pt  . Givens capsule study N/A 11/24/2012    Procedure: GIVENS CAPSULE STUDY;  Surgeon: Rogene Houston, MD;  Location: [facility];  Service: Endoscopy;  Laterality: N/A;  730  . Esophagogastroduodenoscopy N/A 09/27/2013    Procedure: ESOPHAGOGASTRODUODENOSCOPY (EGD) Push enteroscopy;  Surgeon: Rogene Houston, MD;  Location: [facility];  Service: Endoscopy;  Laterality: N/A;  . Hot hemostasis  09/27/2013    Procedure: HOT HEMOSTASIS (ARGON PLASMA COAGULATION/BICAP);  Surgeon: Rogene Houston, MD;  Location: [facility];  Service: Endoscopy;;  Family History  Problem Relation Age of Onset  . Heart attack Mother   . Heart disease Mother   . Hypertension Mother     Social History Mr. Stokes reports that he has been smoking Cigarettes.  He has a 60 pack-year smoking history. He has never used smokeless tobacco. Mr. Hunsinger reports that he does not drink alcohol.  Review of Systems CONSTITUTIONAL: No weight loss, fever, chills, weakness or fatigue.  HEENT: Eyes: No visual loss, blurred vision, double vision or yellow sclerae. No hearing loss, sneezing, congestion, runny nose or sore throat.  SKIN: No rash or itching.  CARDIOVASCULAR: per HPI.  RESPIRATORY: No shortness of breath, cough or sputum.  GASTROINTESTINAL: No anorexia, nausea, vomiting or diarrhea. No abdominal pain or blood.  GENITOURINARY: no polyuria, no dysuria NEUROLOGICAL: per HPI MUSCULOSKELETAL: No muscle, back pain, joint pain or stiffness.  HEMATOLOGIC: No anemia, bleeding or bruising.  LYMPHATICS: No enlarged nodes. No history of splenectomy.  PSYCHIATRIC: No history of depression or anxiety.      Physical Examination Blood pressure 138/65, pulse 77, temperature 98.3 F (36.8 C), temperature source Oral, resp. rate 18, height 5\' 5"  (1.651 m), weight 175 lb (79.379 kg), SpO2 98.00%.  Intake/Output Summary (Last 24 hours) at 11/22/13 0917 Last data filed at 11/22/13 0400  Gross per 24 hour    Intake      0 ml  Output    650 ml  Net   -650 ml    HEENT: sclera clear  Cardiovascular: irreg, no m/r/g, no JVD  Respiratory: CTAB  GI: abdomen soft, NT, ND  MSK: LEs warm, no edema  Neuro: no focal deficits  Psych: appropriate affect   Lab Results  Basic Metabolic Panel:  Recent Labs Lab 11/21/13 2014 11/22/13 0353  NA 141 144  K 3.3* 3.8  CL 102 104  CO2 28 30  GLUCOSE 132* 99  BUN 11 10  CREATININE 1.05 1.02  CALCIUM 8.9 8.6    Liver Function Tests:  Recent Labs Lab 11/22/13 0353  AST 16  ALT 6  ALKPHOS 84  BILITOT 0.4  PROT 6.6  ALBUMIN 3.1*    CBC:  Recent Labs Lab 11/21/13 2014 11/22/13 0353  WBC 5.5 5.6  NEUTROABS 3.5  --   HGB 9.8* 8.7*  HCT 31.4* 28.4*  MCV 84.2 84.3  PLT 243 218    Cardiac Enzymes:  Recent Labs Lab 11/21/13 2014 11/21/13 2252 11/22/13 0353  TROPONINI <0.30 <0.30 <0.30    BNP: No components found with this basename: POCBNP,    ECG   Imaging Cath 02/2012 Procedural Findings:  Hemodynamics:  AO 108/45 mean 74  LV 115/17  Coronary angiography:  Coronary dominance: right  Left mainstem: Calcified, diffuse nonobstructive disease, 30-40% distal stenosis  Left anterior descending (LAD): Heavily calcified with severe 80% proximal stenosis, fills from LIMA  Left circumflex (LCx): Total occlusion in mid vess just beyond OM1 origin. 70% proximal stenosis. OM1 patent, OM fills from left-left collaterals. AV circ beyond OM2 is severely diseased  Right coronary artery (RCA): Total proximal occlusion  SVG - Ramus Intermedius is widely patent. Native ramus is large without significant disease. Fills PL branches from left-left collaterals  SVG - RCA: occluded in proximal body of graft  LIMA - LAD: patent throughout. Fills right PDA via collaterals  Left ventriculography: Left ventricular systolic function is hyperdynamic with marked apical hypertrophy. LVEF estimated at 65-70%.  Final Conclusions:  1.  Severe 3 vessel CAD  2. S/P CABG with  continued patency of the LIMA-LAD and SVG-ramus intermedius  3. Occluded SVG-RCA  4. Hypertrophic LV with preserved systolic function and normal diastolic filling pressure   Impression/Recommendations  1. New onset afib - normal rates on tele, he is already on coreg. - unclear if symptoms were symptomatic afib or possible ischemia, or potentially both - CHADS2Vasc score is 4. He is poor anticoag candidate due to recurrent GI bleeds with chronic anemia requiring transfusions, continue plavix.  - increase coreg to 12.5mg  bid for increased rate control and strong antianginal effects - f/u TSH, f/u echo  2. Chest pain - unclear if symptomatic afib or ischemia, or potentially both. Symptoms somewhat atypical for angina in duration - no evidence of ACS at this time, he has chronic anterolateral precordial EKG changes - f/u echo, if significant LVEF drop would consider invasive testing. If stable LVEF, likely Lexiscan MPI in morning. Management of obstructive CAD would be complicated by his recurrent issues with GI bleeding potentially complicated if he had to be committed to [facility].   3. CAD - increase to high dose statin given known hx of CAD (atorva 80 or crestor 20) - he is on long term plavix, not on ASA I assume due to recurrent Gi bleeds  4. Neuro symptoms - consider neuro consult   Carlyle Dolly, M.D., F.A.C.C.

## 2013-11-22 NOTE — Progress Notes (Signed)
  Echocardiogram 2D Echocardiogram has been performed.  [place], [place] 11/22/2013, 11:52 AM

## 2013-11-22 NOTE — Progress Notes (Signed)
Note: This document was prepared with digital dictation and possible smart phrase technology. Any transcriptional errors that result from this process are unintentional.   Donald Gaines [id number] DOB: 06/21/1940 DOA: 11/21/2013 PCP: Delphina Cahill, MD  Brief narrative: 74 y/o ?, known h/o CAD s/p CABG BMS 2000-last cath 12/13=Severe 3v CAD, h/o Carotid dx, Hld, Htn, DMty 2 admitted 11/21/13 with CP and found to be in new onset Afib. Cardiology consulted  Past medical history-As per Problem list Chart reviewed as below- reviewed  Consultants:  Cardiology  Procedures:  None yet  Antibiotics:  none   Subjective  Alert pleasant Cannot specify what exactly he was feeling Did however state he felt a "heaviness" Feels much better toelrating diet   Objective    Interim History:   Telemetry: Afib, rate 120's   Objective: Filed Vitals:   11/21/13 2320 11/22/13 0020 11/22/13 0500 11/22/13 1123  BP: 144/86 130/66 138/65   Pulse: 109 125 77   Temp: 98 F (36.7 C) 98.2 F (36.8 C) 98.3 F (36.8 C)   TempSrc: Oral Oral Oral   Resp: 20 18 18    Height: 5\' 5"  (1.651 m)     Weight:      SpO2: 97% 92% 98% 95%    Intake/Output Summary (Last 24 hours) at 11/22/13 1211 Last data filed at 11/22/13 0400  Gross per 24 hour  Intake      0 ml  Output    650 ml  Net   -650 ml    Exam:  General: eomi, ncat Cardiovascular: s1 s2 irreg irreg Respiratory: clear no added sound, no TVR/TVF Abdomen: soft, NT, ND Skin intact Neuro intact  Data Reviewed: Basic Metabolic Panel:  Recent Labs Lab 11/21/13 2014 11/22/13 0353  NA 141 144  K 3.3* 3.8  CL 102 104  CO2 28 30  GLUCOSE 132* 99  BUN 11 10  CREATININE 1.05 1.02  CALCIUM 8.9 8.6   Liver Function Tests:  Recent Labs Lab 11/22/13 0353  AST 16  ALT 6  ALKPHOS 84  BILITOT 0.4  PROT 6.6  ALBUMIN 3.1*   No results found for this basename: LIPASE, AMYLASE,  in the last 168 hours No results found for  this basename: AMMONIA,  in the last 168 hours CBC:  Recent Labs Lab 11/21/13 2014 11/22/13 0353  WBC 5.5 5.6  NEUTROABS 3.5  --   HGB 9.8* 8.7*  HCT 31.4* 28.4*  MCV 84.2 84.3  PLT 243 218   Cardiac Enzymes:  Recent Labs Lab 11/21/13 2014 11/21/13 2252 11/22/13 0353 11/22/13 1015  TROPONINI <0.30 <0.30 <0.30 <0.30   BNP: No components found with this basename: POCBNP,  CBG:  Recent Labs Lab 11/21/13 2327 11/22/13 0807 11/22/13 1139  GLUCAP 102* 105* 105*    No results found for this or any previous visit (from the past 240 hour(s)).   Studies:              All Imaging reviewed and is as per above notation   Scheduled Meds: . atorvastatin  80 mg Oral q1800  . beta carotene w/minerals  1 tablet Oral Daily  . carvedilol  6.25 mg Oral BID WC  . clopidogrel  75 mg Oral q morning - 10a  . cycloSPORINE  1 drop Both Eyes BID  . DULoxetine  30 mg Oral BID  . dutasteride  0.5 mg Oral q morning - 10a  . furosemide  40 mg Oral Daily  . heparin  5,000  Units Subcutaneous 3 times per day  . insulin aspart  0-15 Units Subcutaneous TID WC  . insulin aspart  0-5 Units Subcutaneous QHS  . isosorbide mononitrate  60 mg Oral Daily  . linagliptin  5 mg Oral BID WC   And  . metFORMIN  1,000 mg Oral BID WC  . loratadine  10 mg Oral Daily  . losartan  50 mg Oral q morning - 10a  . mirabegron ER  25 mg Oral Daily  . mometasone-formoterol  2 puff Inhalation BID  . pantoprazole  40 mg Oral Daily  . polyvinyl alcohol  3 drop Right Eye TID  . sodium chloride  3 mL Intravenous Q12H  . tamsulosin  0.4 mg Oral q morning - 10a   Continuous Infusions:    Assessment/Plan: 1. Cardiogenic CP/anginal equivalent-no injury per Cardiac markers-defer to cardiology mange ment-see their note from 9/2-continue Imdur 60 2. New Onset Afib CHad2Vasc2 score=4-poor candidate for systemic AC.  Agree with Plavix as per Cardiology.  Increased Coreg to 12.5 bid as Hr still 120's at reast.  monitor  BP. 3. DM ty 2-diabetic diet-patient eating fries and burger in room.  Continue Metformin and linagliptin-might need to discontinue metformin if cath planned.  Am sugars.  Cover c qid ac hs insulin if above am sugars of 200. 4. Compensated CHF-last echo poor images 10/2012-continue lasix 40 po qd 5. Carotid disease-agree c cardiology-increase lipitor to 80 mg daily 6. htn-continue Losartan 50 daily in addition to other meds as above 7. BPD-continue Mirabegron 25 daily  Code Status: full Family Communication:  Discussed with sister at bedside POC Disposition Plan: inpatinet   Verneita Griffes, MD  [facility] Pager [phone] 11/22/2013, 12:11 PM    LOS: 1 day

## 2013-11-22 NOTE — Care Management Note (Addendum)
    Page 1 of 1   11/23/2013     2:17:57 PM CARE MANAGEMENT NOTE 11/23/2013  Patient:  Donald Gaines, Donald Gaines   Account Number:  [id number]  Date Initiated:  11/22/2013  Documentation initiated by:  Theophilus Kinds  Subjective/Objective Assessment:   Pt admitted from home with CP. Pt is living with his sister and will return home at discharge. Pt has home O2 with [facility] and has a neb machine as well and a cane and walker.     Action/Plan:   PT recommends HH PT. Pt stated that he needs to discuss this with his sister before he would agree to any home health. Will continue to follow for discharge planning needs.   Anticipated DC Date:  11/24/2013   Anticipated DC Plan:  [place]  CM consult      Choice offered to / List presented to:             Status of service:  Completed, signed off Medicare Important Message given?   (If response is "NO", the following Medicare IM given date fields will be blank) Date Medicare IM given:   Medicare IM given by:   Date Additional Medicare IM given:   Additional Medicare IM given by:    Discharge Disposition:  HOME/SELF CARE  Per UR Regulation:    If discussed at Long Length of Stay Meetings, dates discussed:    Comments:  11/23/2013 [place], RN, MSN, PCCN Pt plans to discharge home with self care. Spoke with pt about [facility] PT referral and pt has refused [place] services. He felt very strongly that he did not want "people messin around his house".  11/22/13 [place], RN BSN CM Pt meets CC 44 conditions. Copy given to pt who verbalizes understanding.

## 2013-11-23 ENCOUNTER — Observation Stay (HOSPITAL_COMMUNITY): Payer: Medicare HMO

## 2013-11-23 ENCOUNTER — Encounter (HOSPITAL_COMMUNITY): Payer: Self-pay

## 2013-11-23 LAB — GLUCOSE, CAPILLARY
Glucose-Capillary: 118 mg/dL — ABNORMAL HIGH (ref 70–99)
Glucose-Capillary: 136 mg/dL — ABNORMAL HIGH (ref 70–99)

## 2013-11-23 LAB — BASIC METABOLIC PANEL
ANION GAP: 10 (ref 5–15)
BUN: 17 mg/dL (ref 6–23)
CO2: 28 mEq/L (ref 19–32)
CREATININE: 0.98 mg/dL (ref 0.50–1.35)
Calcium: 9 mg/dL (ref 8.4–10.5)
Chloride: 102 mEq/L (ref 96–112)
GFR calc non Af Amer: 81 mL/min — ABNORMAL LOW (ref >90)
Glucose, Bld: 126 mg/dL — ABNORMAL HIGH (ref 70–99)
Potassium: 4.2 mEq/L (ref 3.7–5.3)
Sodium: 140 mEq/L (ref 137–147)

## 2013-11-23 MED ORDER — CARVEDILOL 12.5 MG PO TABS: 12.5000 mg | ORAL_TABLET | Freq: Two times a day (BID) | ORAL | Status: DC

## 2013-11-23 MED ORDER — ATORVASTATIN CALCIUM 80 MG PO TABS: 80.0000 mg | ORAL_TABLET | Freq: Every day | ORAL | Status: DC

## 2013-11-23 MED ORDER — SODIUM CHLORIDE 0.9 % IJ SOLN
INTRAMUSCULAR | Status: AC
  Filled 2013-11-23: qty 10

## 2013-11-23 MED ORDER — REGADENOSON 0.4 MG/5ML IV SOLN
0.4000 mg | Freq: Once | INTRAVENOUS | Status: AC
  Administered 2013-11-23: 0.4 mg via INTRAVENOUS
  Filled 2013-11-23: qty 5

## 2013-11-23 MED ORDER — TECHNETIUM TC 99M SESTAMIBI GENERIC - CARDIOLITE
30.0000 | Freq: Once | INTRAVENOUS | Status: AC | PRN
  Administered 2013-11-23: 30 via INTRAVENOUS

## 2013-11-23 MED ORDER — TECHNETIUM TC 99M SESTAMIBI - CARDIOLITE
10.0000 | Freq: Once | INTRAVENOUS | Status: AC | PRN
  Administered 2013-11-23: 09:00:00 11 via INTRAVENOUS

## 2013-11-23 MED ORDER — REGADENOSON 0.4 MG/5ML IV SOLN
INTRAVENOUS | Status: AC
  Administered 2013-11-23: 0.4 mg via INTRAVENOUS
  Filled 2013-11-23: qty 5

## 2013-11-23 NOTE — Progress Notes (Signed)
UR Completed.  [phone]  

## 2013-11-23 NOTE — Discharge Summary (Signed)
Physician Discharge Summary  Donald Gaines [id number] DOB: 11-25-1940 DOA: 11/21/2013  PCP: Delphina Cahill, MD  Admit date: 11/21/2013 Discharge date: 11/23/2013  Time spent: 35 minutes  Recommendations for Outpatient Follow-up:  1. Continue Coreg at 12.5 twice a day 2. Poor anticoagulation candidate-continue aspirin and further discussion as an outpatient with GI and cardiology 3. Get Chem-12 and CBC in about a week  Discharge Diagnoses:  Active Problems:   DIABETES MELLITUS   Essential hypertension, benign   Atypical chest pain   AF (atrial fibrillation)   Chest pain   A-fib   Discharge Condition: Good  Diet recommendation: Heart healthy  Filed Weights   11/21/13 1953  Weight: 79.379 kg (175 lb)    History of present illness:  73 y/o ?, known h/o CAD s/p CABG BMS 2000-last cath 12/13=Severe 3v CAD, h/o Carotid dx, Hld, Htn, DMty 2 admitted 11/21/13 with CP and found to be in new onset Afib.  Cardiology consulted   Hospital Course:    1. Chest pain-related to A. fib-no injury per Cardiac markers-defer to cardiology mange ment-see their note from 9/2-continue Imdur 60 2. New Onset Afib CHad2Vasc2 score=4-poor candidate for systemic AC. Agree with Plavix as per Cardiology as he is had GI bleeding when he was on aspirin. Increased Coreg 12.5 bid on discharge and heart rates controlled in the low 100 range 3. DM ty 2-diabetic diet-patient eating fries and burger in room. Continue Metformin and linagliptin as an outpatient. Blood sugars during hospitalization 118-136 4. Compensated CHF-last echo poor images 10/2012-continue lasix 40 po qd 5. Carotid disease-all increase lipitor to 80 mg daily 6. htn-continue Losartan 50 daily in addition to other meds as above 7. BPD-continue Mirabegron 25 daily   Consultants:  Cardiology Procedures:   Scan 9/3 Lexiscan shows inferolateral wall scar, no current iscehmia. High risk study due to low measured LVEF at 39%, Antibiotics:   none  Discharge Exam: Filed Vitals:   11/23/13 0500  BP: 114/70  Pulse: 79  Temp: 98.1 F (36.7 C)  Resp: 20    General: Alert pleasant oriented no apparent distress Cardiovascular: S1-S2 no murmur rub or gallop rate controlled A. fib Respiratory: Clinically clear  Discharge Instructions You were cared for by a hospitalist during your hospital stay. If you have any questions about your discharge medications or the care you received while you were in the hospital after you are discharged, you can call the unit and asked to speak with the hospitalist on call if the hospitalist that took care of you is not available. Once you are discharged, your primary care physician will handle any further medical issues. Please note that NO REFILLS for any discharge medications will be authorized once you are discharged, as it is imperative that you return to your primary care physician (or establish a relationship with a primary care physician if you do not have one) for your aftercare needs so that they can reassess your need for medications and monitor your lab values.  Discharge Instructions   Diet - low sodium heart healthy    Complete by:  As directed      Discharge instructions    Complete by:  As directed   You have been diagnosed with atrial fibrillation which is an irregular heart V. tach causes at risk for stroke. Usually in these cases we give people up but then unfortunately you have a lot of stomach ulcers that have a risk of bleeding in urine at candidate-you are at a higher  risk of stroke without and anticoagulant or blood thinner but aspirin May of is some protection. We've increased your carvedilol dose at this admission. This will control the heart rate and you have need close followup with her cardiologist     Increase activity slowly    Complete by:  As directed           Current Discharge Medication List    START taking these medications   Details  atorvastatin (LIPITOR) 80  MG tablet Take 1 tablet (80 mg total) by mouth daily at 6 PM. Qty: 30 tablet, Refills: 0      CONTINUE these medications which have CHANGED   Details  carvedilol (COREG) 12.5 MG tablet Take 1 tablet (12.5 mg total) by mouth 2 (two) times daily with a meal. Qty: 60 tablet, Refills: 0      CONTINUE these medications which have NOT CHANGED   Details  ADVAIR DISKUS 250-50 MCG/DOSE AEPB Inhale 1 puff into the lungs 2 (two) times daily.     albuterol (PROAIR HFA) 108 (90 BASE) MCG/ACT inhaler Inhale 2 puffs into the lungs every 6 (six) hours as needed for wheezing or shortness of breath.    ALPRAZolam (XANAX) 0.5 MG tablet Take 0.5 mg by mouth 2 (two) times daily as needed for anxiety.     AVODART 0.5 MG capsule Take 0.5 mg by mouth every morning.     beta carotene w/minerals (OCUVITE) tablet Take 1 tablet by mouth daily.    Carboxymethylcellul-Glycerin (REFRESH OPTIVE OP) Place 1 drop into the right eye 3 (three) times daily. For both eyes   Associated Diagnoses: Iron deficiency anemia, unspecified    cetirizine (ZYRTEC) 10 MG tablet Take 10 mg by mouth every morning.     clopidogrel (PLAVIX) 75 MG tablet Take 1 tablet (75 mg total) by mouth every morning.    cycloSPORINE (RESTASIS) 0.05 % ophthalmic emulsion Place 1 drop into both eyes 2 (two) times daily.    DULoxetine (CYMBALTA) 30 MG capsule Take 30 mg by mouth 2 (two) times daily.    furosemide (LASIX) 40 MG tablet Take 40 mg by mouth daily.     isosorbide mononitrate (IMDUR) 60 MG 24 hr tablet Take 1 tablet (60 mg total) by mouth daily. Qty: 30 tablet, Refills: 11    JANUMET 50-1000 MG per tablet Take 1 tablet by mouth 2 (two) times daily with a meal.     losartan (COZAAR) 50 MG tablet Take 50 mg by mouth every morning.     mirabegron ER (MYRBETRIQ) 25 MG TB24 tablet Take 25 mg by mouth daily.    NITROSTAT 0.4 MG SL tablet Place 0.4 mg under the tongue every 5 (five) minutes as needed for chest pain.     omeprazole  (PRILOSEC) 20 MG capsule Take 20 mg by mouth daily.     Tamsulosin HCl (FLOMAX) 0.4 MG CAPS Take 0.4 mg by mouth every morning. Takes in AM      STOP taking these medications     rosuvastatin (CRESTOR) 20 MG tablet      traMADol (ULTRAM) 50 MG tablet        Allergies  Allergen Reactions  . Penicillins Rash      The results of significant diagnostics from this hospitalization (including imaging, microbiology, ancillary and laboratory) are listed below for reference.    Significant Diagnostic Studies: Dg Chest 2 View  11/21/2013   CLINICAL DATA:  Chest pain, history COPD, diabetes, hypertension, coronary artery disease post MI,  stenting and CABG  EXAM: CHEST  2 VIEW  COMPARISON:  09/24/2013  FINDINGS: Enlargement of cardiac silhouette post median sternotomy.  Pulmonary vascular congestion.  COPD changes with RIGHT basilar atelectasis.  Remaining lungs grossly clear.  No pleural effusion or pneumothorax.  Bones demineralized.  IMPRESSION: COPD changes with RIGHT basilar atelectasis.  Enlargement of cardiac silhouette post median sternotomy.   Electronically Signed   By: Lavonia Dana M.D.   On: 11/21/2013 21:47   Ct Head Wo Contrast  11/21/2013   CLINICAL DATA:  73 year old male with episode of aphasia. Altered mental status. Initial encounter.  EXAM: CT HEAD WITHOUT CONTRAST  TECHNIQUE: Contiguous axial images were obtained from the base of the skull through the vertex without intravenous contrast.  COMPARISON:  12/21/2005.  FINDINGS: Chronic left maxillary sinusitis. Stable visualized paranasal sinuses and mastoids. No acute osseous abnormality identified.  Interval postoperative changes to the globes. No acute scalp soft tissue findings.  Calcified atherosclerosis at the skull base. Mild generalized cerebral volume loss since 2007. Chronic but increased subcortical white matter hypodensity in the right hemisphere tracking toward the right external capsule. Chronic heterogeneity of the deep  gray matter nuclei elsewhere not significantly changed. No evidence of cortically based acute infarction identified. No suspicious intracranial vascular hyperdensity. No midline shift, mass effect, or evidence of intracranial mass lesion. No acute intracranial hemorrhage identified.  IMPRESSION: No acute intracranial abnormality. Progressed chronic small vessel disease since 2007.   Electronically Signed   By: Lars Pinks M.D.   On: 11/21/2013 21:29   Nm Myocar Multi W/spect W/wall Motion / Ef  11/23/2013   CLINICAL DATA:  73 year old male with a known history of coronary artery disease referred for chest pain.  EXAM: MYOCARDIAL IMAGING WITH SPECT (REST AND PHARMACOLOGIC-STRESS)  GATED LEFT VENTRICULAR WALL MOTION STUDY  LEFT VENTRICULAR EJECTION FRACTION  TECHNIQUE: Standard myocardial SPECT imaging was performed after resting intravenous injection of 10 mCi Tc-54m sestamibi. Subsequently, intravenous infusion of Lexiscan was performed under the supervision of the Cardiology staff. At peak effect of the drug, 30 mCi Tc-33m sestamibi was injected intravenously and standard myocardial SPECT imaging was performed. Quantitative gated imaging was also performed to evaluate left ventricular wall motion, and estimate left ventricular ejection fraction.  COMPARISON:  None.  FINDINGS: Pharmacological stress  Baseline EKG showed atrial fibrillation with T-wave inversion leads in leads II,III,aVF V4 through V6. After injection heart rate increased from 91 beats per min up to 122 beats per min, and blood pressure decreased from 1-40/77 down to 135/78. The test was stopped after injection was complete, the patient did not experience any chest pain. Post-injection EKG showed atrial fibrillation and unchanged baseline ST/T changes.  Perfusion: Large moderate intensity fixed inferolateral wall defect with no reversibility  Wall Motion:  Inferolateral wall is hypokinetic  Left Ventricular Ejection Fraction: 39 %  End diastolic  volume 123456 ml  End systolic volume 72 ml  IMPRESSION: 1.  Large inferolateral wall scar with no peri-infarct ischemia.  2. Decreased left ventricular systolic function with inferolateral wall hypokinesis.  3. Left ventricular ejection fraction 39%  4. High-risk stress test findings based on low ejection fraction, recommend correlate findings with echocardiogram. There is no current myocardium at jeopardy*.  *2012 Appropriate Use Criteria for Coronary Revascularization Focused Update: J Am Coll Cardiol. N6492421. http://content.airportbarriers.com.aspx?articleid=1201161   Electronically Signed   By: Carlyle Dolly   On: 11/23/2013 12:31    Microbiology: No results found for this or any previous visit (from the past  240 hour(s)).   Labs: Basic Metabolic Panel:  Recent Labs Lab 11/21/13 2014 11/22/13 0353 11/23/13 1232  NA 141 144 140  K 3.3* 3.8 4.2  CL 102 104 102  CO2 28 30 28   GLUCOSE 132* 99 126*  BUN 11 10 17   CREATININE 1.05 1.02 0.98  CALCIUM 8.9 8.6 9.0   Liver Function Tests:  Recent Labs Lab 11/22/13 0353  AST 16  ALT 6  ALKPHOS 84  BILITOT 0.4  PROT 6.6  ALBUMIN 3.1*   No results found for this basename: LIPASE, AMYLASE,  in the last 168 hours No results found for this basename: AMMONIA,  in the last 168 hours CBC:  Recent Labs Lab 11/21/13 2014 11/22/13 0353  WBC 5.5 5.6  NEUTROABS 3.5  --   HGB 9.8* 8.7*  HCT 31.4* 28.4*  MCV 84.2 84.3  PLT 243 218   Cardiac Enzymes:  Recent Labs Lab 11/21/13 2014 11/21/13 2252 11/22/13 0353 11/22/13 1015  TROPONINI <0.30 <0.30 <0.30 <0.30   BNP: BNP (last 3 results)  Recent Labs  09/24/13 1750 11/21/13 2014  PROBNP 2129.0* 4018.0*   CBG:  Recent Labs Lab 11/22/13 1139 11/22/13 1631 11/22/13 2216 11/23/13 0758 11/23/13 1228  GLUCAP 105* 131* 133* 118* 136*       Signed:  Nita Sells  [facility] 11/23/2013, 1:43 PM

## 2013-11-23 NOTE — Progress Notes (Signed)
Lexiscan shows inferolateral wall scar, no current iscehmia. High risk study due to low measured LVEF at 39%, however LVEF by echo more accurate and reported at 50-55%. Chest pain appears does not appear to be ischemic, may have been related to symptomatic afib. Rates better controlled on high dose coreg, not anticoag candidate due to recurrent GI bleeds requiring transfusions, continue ASA. [place] for discharge from cardiology standpoint, can f/u with NP Lawrence in 1-2 weeks. Will sign off of inpatient care.    Zandra Abts MD

## 2013-11-23 NOTE — Progress Notes (Signed)
Stress Lab Nurses Notes - [place]  CAVANAUGH BUTZLAFF 11/23/2013 Reason for doing test: CAD and AFib Type of test: Wille Glaser / inpatient Rm 313 Nurse performing test: Gerrit Halls, RN Nuclear Medicine Tech: Melburn Hake Echo Tech: Not Applicable MD performing test: Branch/K.Purcell Nails NP Family MD: [place] Crane Test explained and consent signed: Yes.   IV started: Saline lock flushed, No redness or edema and Saline lock from floor Symptoms: Dizziness & nausea Treatment/Intervention: None Reason test stopped: protocol completed After recovery IV was: No redness or edema and Saline Lock flushed Patient to return to [place]. Med at : 12:00 Patient discharged: Transported back to room 313 via wc Patient's Condition upon discharge was: stable Comments: During test BP 126/80 & HR 122.  Recovery BP 135/78 & HR 98.  Symptoms resolved in recovery. [name]

## 2013-11-23 NOTE — Progress Notes (Signed)
Patient is being discharged home with Home Health services. Discharge instructions given on medications,and follow up visits,patient verbalized understanding.No c/o pain or discomfort noted. Vital signs stable.Prescriptions were sent to Pharmacy of choice documented on AVS. Accompanied by staff to an awaiting vehicle.

## 2013-11-23 NOTE — Progress Notes (Signed)
Patient ID: Donald Gaines, male   DOB: 1940-12-03, 73 y.o.   MRN: [id number]     Subjective:    No chest pain  Objective:   Temp:  [97.9 F (36.6 C)-98.1 F (36.7 C)] 98.1 F (36.7 C) (09/03 0500) Pulse Rate:  [79-99] 79 (09/03 0500) Resp:  [20] 20 (09/03 0500) BP: (114-128)/(69-74) 114/70 mmHg (09/03 0500) SpO2:  [95 %-99 %] 99 % (09/03 0730) Last BM Date: 11/21/13  Filed Weights   11/21/13 1953  Weight: 175 lb (79.379 kg)    Intake/Output Summary (Last 24 hours) at 11/23/13 0824 Last data filed at 11/23/13 0501  Gross per 24 hour  Intake    240 ml  Output    500 ml  Net   -260 ml    Telemetry: afib rates 70-90s  Exam:  General: NAD  Resp: coarse bilaterally, no wheezing  Cardiac: irreg, no m/r/g, no JVD  GI: abdomen soft, NT, ND  MSK: no LE edema  Neuro: no focal deficits  Psych: appropriate affect  Lab Results:  Basic Metabolic Panel:  Recent Labs Lab 11/21/13 2014 11/22/13 0353  NA 141 144  K 3.3* 3.8  CL 102 104  CO2 28 30  GLUCOSE 132* 99  BUN 11 10  CREATININE 1.05 1.02  CALCIUM 8.9 8.6    Liver Function Tests:  Recent Labs Lab 11/22/13 0353  AST 16  ALT 6  ALKPHOS 84  BILITOT 0.4  PROT 6.6  ALBUMIN 3.1*    CBC:  Recent Labs Lab 11/21/13 2014 11/22/13 0353  WBC 5.5 5.6  HGB 9.8* 8.7*  HCT 31.4* 28.4*  MCV 84.2 84.3  PLT 243 218    Cardiac Enzymes:  Recent Labs Lab 11/21/13 2252 11/22/13 0353 11/22/13 1015  TROPONINI <0.30 <0.30 <0.30    BNP:  Recent Labs  09/24/13 1750 11/21/13 2014  PROBNP 2129.0* 4018.0*    Coagulation:  Recent Labs Lab 11/21/13 2014  INR 1.07    ECG:   Medications:   Scheduled Medications: . atorvastatin  80 mg Oral q1800  . beta carotene w/minerals  1 tablet Oral Daily  . carvedilol  12.5 mg Oral BID WC  . clopidogrel  75 mg Oral q morning - 10a  . cycloSPORINE  1 drop Both Eyes BID  . DULoxetine  30 mg Oral BID  . dutasteride  0.5 mg Oral q morning - 10a    . furosemide  40 mg Oral Daily  . heparin  5,000 Units Subcutaneous 3 times per day  . insulin aspart  0-5 Units Subcutaneous QHS  . isosorbide mononitrate  60 mg Oral Daily  . linagliptin  5 mg Oral BID WC   And  . metFORMIN  1,000 mg Oral BID WC  . loratadine  10 mg Oral Daily  . losartan  50 mg Oral q morning - 10a  . mirabegron ER  25 mg Oral Daily  . mometasone-formoterol  2 puff Inhalation BID  . pantoprazole  40 mg Oral Daily  . polyvinyl alcohol  3 drop Right Eye TID  . sodium chloride  3 mL Intravenous Q12H  . tamsulosin  0.4 mg Oral q morning - 10a     Infusions:     PRN Medications:  albuterol, ALPRAZolam, morphine injection, ondansetron (ZOFRAN) IV, ondansetron, traMADol  11/22/13 Echo Study Conclusions  - Left ventricle: The cavity size was normal. Wall thickness was increased in a pattern of mild LVH. Systolic function was low normal. The estimated ejection fraction  was in the range of 50% to 55%. Borderline global hypokinesis. The study was not technically sufficient to allow evaluation of LV diastolic dysfunction due to atrial fibrillation. Doppler parameters are consistent with both elevated ventricular end-diastolic filling pressure and elevated left atrial filling pressure. - Ventricular septum: Septal motion showed abnormal function and dyssynergy. These changes are consistent with a post-thoracotomy state. - Aortic valve: Mildly thickened, mildly calcified leaflets. - Mitral valve: There was mild to moderate regurgitation, with two distinct jets noted. - Left atrium: The atrium was severely dilated. Volume/bsa, S: 67.9 ml/m^2. - Right ventricle: Systolic function was mildly reduced. - Right atrium: The atrium was mildly dilated. - Tricuspid valve: There was moderate regurgitation. - Pulmonary arteries: PA peak pressure: 63 mm Hg (S). Severely elevated pulmonary pressures. - Inferior vena cava: The vessel was dilated. The respirophasic diameter  changes were blunted (< 50%), consistent with elevated central venous pressure. - Pericardium, extracardiac: A small pericardial effusion was identified posterior to the heart. No hemodynamic compromise.     Assessment/Plan   1. New onset afib  - normal rates on tele, he is already on coreg.  - unclear if symptoms were symptomatic afib or possible ischemia, or potentially both  - CHADS2Vasc score is 4. He is poor anticoag candidate due to recurrent GI bleeds with chronic anemia requiring transfusions, continue plavix.  - increase coreg to 12.5mg  bid for increased rate control and strong antianginal effects  - echo with severe LAE - yesterday increased coreg to 12.5mg  bid, rates improved today.   2. Chest pain  - unclear if symptomatic afib or ischemia, or potentially both. Symptoms somewhat atypical for angina in duration  - no evidence of ACS at this time, he has chronic anterolateral precordial EKG changes  - echo shows normal LVEF 50-55%, no focal WMAs - plan for Lexiscan today  3. CAD  - increase to high dose statin given known hx of CAD (atorva 80 or crestor 20)  - he is on long term plavix, not on ASA he reports due to history of prior GI bleeds  4. Hypothyroidisim - elevated TSH, management per primary team        Carlyle Dolly, M.D., F.A.C.C.

## 2013-11-24 ENCOUNTER — Ambulatory Visit (HOSPITAL_COMMUNITY): Payer: Medicare HMO

## 2013-11-24 ENCOUNTER — Encounter (HOSPITAL_COMMUNITY): Payer: Medicare HMO

## 2013-12-04 ENCOUNTER — Encounter: Payer: Self-pay | Admitting: Cardiology

## 2013-12-04 ENCOUNTER — Ambulatory Visit (INDEPENDENT_AMBULATORY_CARE_PROVIDER_SITE_OTHER): Payer: Medicare HMO | Admitting: Cardiology

## 2013-12-04 VITALS — BP 100/62 | HR 105 | Ht 65.0 in | Wt 175.0 lb

## 2013-12-04 DIAGNOSIS — I7090 Unspecified atherosclerosis: Secondary | ICD-10-CM

## 2013-12-04 DIAGNOSIS — I10 Essential (primary) hypertension: Secondary | ICD-10-CM

## 2013-12-04 DIAGNOSIS — I4891 Unspecified atrial fibrillation: Secondary | ICD-10-CM

## 2013-12-04 DIAGNOSIS — I2510 Atherosclerotic heart disease of native coronary artery without angina pectoris: Secondary | ICD-10-CM

## 2013-12-04 DIAGNOSIS — I4819 Other persistent atrial fibrillation: Secondary | ICD-10-CM

## 2013-12-04 DIAGNOSIS — E782 Mixed hyperlipidemia: Secondary | ICD-10-CM

## 2013-12-04 MED ORDER — LOSARTAN POTASSIUM 25 MG PO TABS: 25.0000 mg | ORAL_TABLET | Freq: Every day | ORAL | Status: DC

## 2013-12-04 NOTE — Assessment & Plan Note (Signed)
Continue Coreg for heart rate control. He has a history of recurrent GI bleeding due to ulcers and also AVMs, a suboptimal anticoagulation candidate, although CHADSVASC score is 4. He has been on Plavix with prior history of TIA. He does not want to take an anticoagulant.

## 2013-12-04 NOTE — Progress Notes (Signed)
Clinical Summary Mr. Doorn is a medically complex 73 y.o.male I have not seen since April 2013, subsequent visits have been with Ms. Lawrence NP.  He was recently seen by Dr. Harl Bowie as an inpatient consult earlier this month with newly documented atrial fibrillation, rate controlled. CHADSVASC score is 4, however felt to be poor anticoagulation candidate due to recurrent GI bleeding due to ulcers and AVMs.  He did have a recent [place] on September 3 reporting a large inferolateral scar with no clear evidence of ischemia, LVEF calculated at 39%, however low normal by echocardiogram done around the same time.  Recent echocardiogram done in September reported mild LVH with LVEF 50-55%, increased LVEDP with indeterminate diastolic function, septal dyssynergy, mild to moderate mitral regurgitation, severe left atrial enlargement, mildly reduced RV contraction, moderate tricuspid regurgitation with PASD 63 mmHg, small pericardial effusion.  Lab work from September 3 revealed potassium 4.2, BUN 17, creatinine 0.9. Recent ECGs reviewed.  He tells that he has had intermittent dizziness with standing, had one brief of that he "blacked out.." blood pressure is low normal today. We did review his medications. I have suggested some changes in his treatment regimen.   Allergies  Allergen Reactions  . Penicillins Rash    Current Outpatient Prescriptions  Medication Sig Dispense Refill  . ADVAIR DISKUS 250-50 MCG/DOSE AEPB Inhale 1 puff into the lungs 2 (two) times daily.       Marland Kitchen ALPRAZolam (XANAX) 0.5 MG tablet Take 0.5 mg by mouth 2 (two) times daily as needed for anxiety.       . AVODART 0.5 MG capsule Take 0.5 mg by mouth every morning.       . carvedilol (COREG) 12.5 MG tablet Take 1 tablet (12.5 mg total) by mouth 2 (two) times daily with a meal.  60 tablet  0  . clonazePAM (KLONOPIN) 0.5 MG tablet Take 0.5 mg by mouth 2 (two) times daily.      . clopidogrel (PLAVIX) 75 MG tablet  Take 1 tablet (75 mg total) by mouth every morning.      . DULoxetine (CYMBALTA) 30 MG capsule Take 30 mg by mouth daily.       Marland Kitchen escitalopram (LEXAPRO) 10 MG tablet Take 10 mg by mouth daily.      Marland Kitchen FLUoxetine (PROZAC) 20 MG capsule Take 20 mg by mouth 2 (two) times daily.      . furosemide (LASIX) 40 MG tablet Take 40 mg by mouth 2 (two) times daily.       . isosorbide mononitrate (IMDUR) 60 MG 24 hr tablet Take 1 tablet (60 mg total) by mouth daily.  30 tablet  11  . JANUMET 50-1000 MG per tablet Take 1 tablet by mouth 2 (two) times daily with a meal.       . NITROSTAT 0.4 MG SL tablet Place 0.4 mg under the tongue every 5 (five) minutes as needed for chest pain.       Marland Kitchen omeprazole (PRILOSEC) 20 MG capsule Take 20 mg by mouth daily.       . rosuvastatin (CRESTOR) 20 MG tablet Take 20 mg by mouth daily.      . traMADol (ULTRAM) 50 MG tablet Take 50 mg by mouth every 6 (six) hours as needed.      Marland Kitchen albuterol (PROAIR HFA) 108 (90 BASE) MCG/ACT inhaler Inhale 2 puffs into the lungs every 6 (six) hours as needed for wheezing or shortness of breath.      Marland Kitchen  losartan (COZAAR) 25 MG tablet Take 1 tablet (25 mg total) by mouth daily.  90 tablet  3   No current facility-administered medications for this visit.    Past Medical History  Diagnosis Date  . TIA (transient ischemic attack)     2005  . GERD (gastroesophageal reflux disease)   . Hyperlipidemia   . Essential hypertension, benign   . Depression   . Diabetes mellitus, type 2   . Restrictive lung disease   . Candida esophagitis   . Hemorrhoids   . Arteriovenous malformation small bowel     Capsule study 3/10  . GI bleeding     Cecal ulcers, arteriovenous malformations  . Chronic diarrhea SEP 2011    ?ETIOLOGY-DIABETIC ENTEROPATHY, LACTOSE INTOLERANCE,  OR IBS-MIXED  . Bloating AUG 2009    NL HBT FOR SIBO  . Anemia FEB 2008 FEDA 2o to AVMs, & large colon ulcers    HB 10.7 MCV 74.3; Dr Tressie Stalker  . BPH (benign prostatic hypertrophy)      Dr Maryland Pink  . Glaucoma   . Cataract   . Bilateral carotid artery stenosis     50-75% external left carotid stenosis  . Coronary atherosclerosis of native coronary artery     Multivessel, BMS SVG TO RCA 2000, reportedly  "2" additional stents in interim  . Myocardial infarction     1994  . COPD (chronic obstructive pulmonary disease)   . Sleep apnea   . Portal hypertensive gastropathy 11/08/2012    EGD. Dr. Laural Golden  . Dementia     Social History Mr. Trame reports that he has been smoking Cigarettes.  He has a 60 pack-year smoking history. He has never used smokeless tobacco. Mr. Schrupp reports that he does not drink alcohol.  Review of Systems No chest pain. No sense of palpitations. No obvious bleeding problems. Other systems reviewed and negative except as outlined.  Physical Examination Filed Vitals:   12/04/13 1107  BP: 100/62  Pulse: 105   Filed Weights   12/04/13 1107  Weight: 175 lb (79.379 kg)    No distress, appears pale. HEENT: Conjunctiva and lids normal, oropharynx with poor dentition.  Neck: Supple, no elevated JVP or bruits, or thyromegaly.  Lungs: Clear to auscultation, nonlabored.  Cardiac: Irregularly irregular, indistinct PMI, soft basal systolic murmur, no S3 gallop.  Abdomen: Protuberant, obese, bowel sounds present, no tenderness.  Skin: Warm and dry, scattered tattoos noted.  Extremities: Trace ankle edema, distal pulses one plus.  Musculoskeletal: No kyphosis.  Neuropsychiatric: Alert and oriented x3, affect appropriate.   Problem List and Plan   Persistent atrial fibrillation Continue Coreg for heart rate control. He has a history of recurrent GI bleeding due to ulcers and also AVMs, a suboptimal anticoagulation candidate, although CHADSVASC score is 4. He has been on Plavix with prior history of TIA. He does not want to take an anticoagulant.  Essential hypertension, benign Decrease Cozaar to 25 mg daily. Also stop Flomax. It sounds  like he is having symptomatic hypotension intermittently.  Mixed hyperlipidemia Patient has both Crestor 20 mg daily and Lipitor 80 mg daily. Clearly does not need both. He was already aware of this concern. He will go back to Crestor and stop the Lipitor.    Satira Sark, M.D., F.A.C.C.

## 2013-12-04 NOTE — Assessment & Plan Note (Signed)
Patient has both Crestor 20 mg daily and Lipitor 80 mg daily. Clearly does not need both. He was already aware of this concern. He will go back to Crestor and stop the Lipitor.

## 2013-12-04 NOTE — Patient Instructions (Addendum)
Your physician recommends that you schedule a follow-up appointment in: 3 months    Your physician has recommended you make the following change in your medication:     STOP Lipitor STOP Flomax   DECREASE Cozaar to 25 mg daily  Stay on Crestor 20 mg daily    Thank you for choosing [facility] !

## 2013-12-04 NOTE — Assessment & Plan Note (Signed)
Decrease Cozaar to 25 mg daily. Also stop Flomax. It sounds like he is having symptomatic hypotension intermittently.

## 2013-12-12 ENCOUNTER — Ambulatory Visit (INDEPENDENT_AMBULATORY_CARE_PROVIDER_SITE_OTHER): Payer: Medicare HMO | Admitting: Internal Medicine

## 2013-12-12 ENCOUNTER — Encounter (INDEPENDENT_AMBULATORY_CARE_PROVIDER_SITE_OTHER): Payer: Self-pay | Admitting: Internal Medicine

## 2013-12-12 ENCOUNTER — Telehealth: Payer: Self-pay

## 2013-12-12 ENCOUNTER — Encounter (INDEPENDENT_AMBULATORY_CARE_PROVIDER_SITE_OTHER): Payer: Self-pay | Admitting: *Deleted

## 2013-12-12 ENCOUNTER — Other Ambulatory Visit: Payer: Self-pay

## 2013-12-12 VITALS — BP 90/52 | HR 60 | Temp 97.7°F | Ht 65.0 in | Wt 176.5 lb

## 2013-12-12 DIAGNOSIS — K921 Melena: Secondary | ICD-10-CM

## 2013-12-12 MED ORDER — ROSUVASTATIN CALCIUM 20 MG PO TABS: 20.0000 mg | ORAL_TABLET | Freq: Every day | ORAL | Status: AC

## 2013-12-12 NOTE — Patient Instructions (Signed)
CBC, [place] with elastrography. OV 3  Months.

## 2013-12-12 NOTE — Telephone Encounter (Signed)
Refill

## 2013-12-12 NOTE — Progress Notes (Signed)
Subjective:    Patient ID: Donald Gaines, male    DOB: 1940/04/18, 73 y.o.   MRN: [id number]  HPI Here today for f/u. Recent EGD in July for upper GI bleed.  He tells me he feels pretty good. He says his appetite has been okay. No weight loss. No abdominal pain. He says his BMs are brown.   Prior hx of etoh abuse. No etoh 25 yrs ago.   No NSAIDs.     09/27/2013 EGD: upper GI bleed: Dr. Laural Golden:  Indications: Patient is 73 year old Caucasian male with multiple medical problems who presents with recurrent GI bleed anemia and need for transfusion. He was initially evaluated in 2008, and 2009 and more recently in August last year when he underwent EGD colonoscopy and given capsule study. He was found to have 2 jejunal AVMs without stigmata of bleed.  It is suspected that he bleeds intermittently from small bowel AV malformations.  If these lesions can be reached APC ablation will be performed.  Complications: None  Impression:  Mild portal gastropathy.  Five small AV malformations identified in proximal jejunum without stigmata of bleed.  All of these lesions were ablated with argon plasma coagulator.   10/29/2012 Given's capsule:  Findings:  Prep excellent.  Normal mucosa of terminal ileum.  Five small cecal polyps removed; three via cold snare and two while cold biopsy. All of these polyps were submitted together.  Single small cecal AV malformation which was not bleeding and was left alone.  3 mm rectal polyp cold snared.  Prominent hemorrhoids below the dentate line.    11/08/2012 EGD/Colonoscopy: GI bleed: Prep excellent.  Impression:  Portal gastropathy otherwise normal EGD.  Normal mucosa of terminal ileum.  Single small cecal AVM without stigmata of bleed.  5 small cecal polyps removed as above. These polyps were 4-5 mm in size.  Small rectal polyp cold snared.  External hemorrhoids.       CBC Latest Ref Rng 11/22/2013 11/21/2013 09/27/2013  WBC 4.0 - 10.5 K/uL 5.6 5.5 -    Hemoglobin 13.0 - 17.0 g/dL 8.7(L) 9.8(L) 9.1(L)  Hematocrit 39.0 - 52.0 % 28.4(L) 31.4(L) 29.7(L)  Platelets 150 - 400 K/uL 218 243 -        Review of Systems Past Medical History  Diagnosis Date  . TIA (transient ischemic attack)     2005  . GERD (gastroesophageal reflux disease)   . Hyperlipidemia   . Essential hypertension, benign   . Depression   . Diabetes mellitus, type 2   . Restrictive lung disease   . Candida esophagitis   . Hemorrhoids   . Arteriovenous malformation small bowel     Capsule study 3/10  . GI bleeding     Cecal ulcers, arteriovenous malformations  . Chronic diarrhea SEP 2011    ?ETIOLOGY-DIABETIC ENTEROPATHY, LACTOSE INTOLERANCE,  OR IBS-MIXED  . Bloating AUG 2009    NL HBT FOR SIBO  . Anemia FEB 2008 FEDA 2o to AVMs, & large colon ulcers    HB 10.7 MCV 74.3; Dr Tressie Stalker  . BPH (benign prostatic hypertrophy)     Dr Maryland Pink  . Glaucoma   . Cataract   . Bilateral carotid artery stenosis     50-75% external left carotid stenosis  . Coronary atherosclerosis of native coronary artery     Multivessel, BMS SVG TO RCA 2000, reportedly  "2" additional stents in interim  . Myocardial infarction     1994  . COPD (chronic obstructive pulmonary disease)   .  Sleep apnea   . Portal hypertensive gastropathy 11/08/2012    EGD. Dr. Laural Golden  . Dementia     Past Surgical History  Procedure Laterality Date  . Esophagogastroduodenoscopy  SEP 09    DUODENAL LIPOMA  . Capsule endo  03/10  . Right inguinal hernia repair    . Hydrogen breath test  2009  . Colonoscopy  APR 2008    SIMPLE ADENOMA, [place] TICS, IH  . Colonoscopy  FEB 2008 ANEMIA. [facility]     2 LARGE ILEOCEAL ULCERS 2o to ASA, [place] TICS, IH  . Colonoscopy  SEP 2009 TRANSFUSION DEP ANEMIA    AC AVM-ABLATED, [place] TICS, IH  . Upper gastrointestinal endoscopy  SEP 2009    GASTRIC AVM ABLATED, NL DUO Bx  . Cardiac catherization  04/2012  . Cardiac catheterization  04/05/1996 [facility]    EF 45%,  occluded SVG to circumflex, patent SVG to D1 and PDA and patent LIMA to LAD with severe native vessel disease.  . Cardiac catheterization  08/22/1998 [facility]    Mild LM, severe LAD,severe CX, occlude RCA, patient  SVG to diatal RCA, patent vein graft to D1 and patent LIMA to LAD.   Marland Kitchen Coronary angioplasty with stent placement  08/22/1998 [name]    TEC stenting of the SVG to RCA-Last seen by cardiologist in 2010.  Marland Kitchen Coronary artery bypass graft  1995-triple bypass    LIMA-LAD, LIMA to intermediate branch, SVG to PD and second OM,  . Cataract extraction w/phaco Right 09/22/2012    Procedure: CATARACT EXTRACTION PHACO AND INTRAOCULAR LENS PLACEMENT (IOC);  Surgeon: Tonny Branch, MD;  Location: [facility] ORS;  Service: Ophthalmology;  Laterality: Right;  CDE: 11.43  . Cataract extraction w/phaco Left 10/17/2012    Procedure: CATARACT EXTRACTION PHACO AND INTRAOCULAR LENS PLACEMENT (IOC);  Surgeon: Tonny Branch, MD;  Location: [facility] ORS;  Service: Ophthalmology;  Laterality: Left;  CDE: 8.06  . Colonoscopy with esophagogastroduodenoscopy (egd) N/A 11/08/2012    Procedure: COLONOSCOPY WITH ESOPHAGOGASTRODUODENOSCOPY (EGD);  Surgeon: Rogene Houston, MD;  Location: [facility] END[facility] SU[facility];  Service: Endoscopy;  Laterality: N/A;  250-rescheduled to 7:30 Ann to notify pt  . [place] capsule study N/A 11/24/2012    Procedure: GIVENS CAPSULE STUDY;  Surgeon: Rogene Houston, MD;  Location: [facility];  Service: Endoscopy;  Laterality: N/A;  730  . Esophagogastroduodenoscopy N/A 09/27/2013    Procedure: ESOPHAGOGASTRODUODENOSCOPY (EGD) Push enteroscopy;  Surgeon: Rogene Houston, MD;  Location: [facility];  Service: Endoscopy;  Laterality: N/A;  . Hot hemostasis  09/27/2013    Procedure: HOT HEMOSTASIS (ARGON PLASMA COAGULATION/BICAP);  Surgeon: Rogene Houston, MD;  Location: [facility];  Service: Endoscopy;;    Allergies  Allergen Reactions  . Penicillins Rash    Current Outpatient Prescriptions on File Prior to  Visit  Medication Sig Dispense Refill  . ADVAIR DISKUS 250-50 MCG/DOSE AEPB Inhale 1 puff into the lungs 2 (two) times daily.       Marland Kitchen albuterol (PROAIR HFA) 108 (90 BASE) MCG/ACT inhaler Inhale 2 puffs into the lungs every 6 (six) hours as needed for wheezing or shortness of breath.      . ALPRAZolam (XANAX) 0.5 MG tablet Take 0.5 mg by mouth 2 (two) times daily as needed for anxiety.       . AVODART 0.5 MG capsule Take 0.5 mg by mouth every morning.       . carvedilol (COREG) 12.5 MG tablet Take 1 tablet (12.5 mg total)  by mouth 2 (two) times daily with a meal.  60 tablet  0  . clonazePAM (KLONOPIN) 0.5 MG tablet Take 0.5 mg by mouth 2 (two) times daily.      . clopidogrel (PLAVIX) 75 MG tablet Take 1 tablet (75 mg total) by mouth every morning.      . DULoxetine (CYMBALTA) 30 MG capsule Take 30 mg by mouth daily.       Marland Kitchen escitalopram (LEXAPRO) 10 MG tablet Take 10 mg by mouth daily.      Marland Kitchen FLUoxetine (PROZAC) 20 MG capsule Take 20 mg by mouth 2 (two) times daily.      . furosemide (LASIX) 40 MG tablet Take 40 mg by mouth 2 (two) times daily.       . isosorbide mononitrate (IMDUR) 60 MG 24 hr tablet Take 1 tablet (60 mg total) by mouth daily.  30 tablet  11  . JANUMET 50-1000 MG per tablet Take 1 tablet by mouth 2 (two) times daily with a meal.       . losartan (COZAAR) 25 MG tablet Take 1 tablet (25 mg total) by mouth daily.  90 tablet  3  . NITROSTAT 0.4 MG SL tablet Place 0.4 mg under the tongue every 5 (five) minutes as needed for chest pain.       Marland Kitchen omeprazole (PRILOSEC) 20 MG capsule Take 20 mg by mouth daily.       . rosuvastatin (CRESTOR) 20 MG tablet Take 20 mg by mouth daily.      . traMADol (ULTRAM) 50 MG tablet Take 50 mg by mouth every 6 (six) hours as needed.       No current facility-administered medications on file prior to visit.        Objective:   Physical Exam  Filed Vitals:   12/12/13 1032  BP: 90/52  Pulse: 60  Temp: 97.7 F (36.5 C)  Height: 5\' 5"  (1.651 m)    Weight: 176 lb 8 oz (80.06 kg)    Alert and oriented. Skin warm and dry. Oral mucosa is moist. Skin color pale. Sclera anicteric, conjunctivae is pale. Thyroid not enlarged. No cervical lymphadenopathy. Lungs clear. Heart regular rate and rhythm.  Abdomen is soft. Bowel sounds are positive. No hepatomegaly. No abdominal masses felt. No tenderness.  No edema to lower extremities. Stool brown and guaiac negative.       Assessment & Plan:  Mild portal gastropathy: US abdomen with elastrography.  Upper GI bleed. Guaiac negative. Will repeat CBC OV in 3 months with Dr. Laural Golden.

## 2013-12-13 ENCOUNTER — Encounter (INDEPENDENT_AMBULATORY_CARE_PROVIDER_SITE_OTHER): Payer: Self-pay | Admitting: *Deleted

## 2013-12-13 ENCOUNTER — Telehealth (INDEPENDENT_AMBULATORY_CARE_PROVIDER_SITE_OTHER): Payer: Self-pay | Admitting: *Deleted

## 2013-12-13 ENCOUNTER — Other Ambulatory Visit (INDEPENDENT_AMBULATORY_CARE_PROVIDER_SITE_OTHER): Payer: Self-pay | Admitting: *Deleted

## 2013-12-13 DIAGNOSIS — K921 Melena: Secondary | ICD-10-CM

## 2013-12-13 LAB — CBC
HCT: 28 % — ABNORMAL LOW (ref 39.0–52.0)
Hemoglobin: 8.8 g/dL — ABNORMAL LOW (ref 13.0–17.0)
MCH: 25.3 pg — ABNORMAL LOW (ref 26.0–34.0)
MCHC: 31.4 g/dL (ref 30.0–36.0)
MCV: 80.5 fL (ref 78.0–100.0)
PLATELETS: 284 10*3/uL (ref 150–400)
RBC: 3.48 MIL/uL — ABNORMAL LOW (ref 4.22–5.81)
RDW: 19.7 % — ABNORMAL HIGH (ref 11.5–15.5)
WBC: 6.2 10*3/uL (ref 4.0–10.5)

## 2013-12-13 NOTE — Telephone Encounter (Signed)
.  Per Donald Gaines patient to have labs in 4 weeks.

## 2013-12-15 ENCOUNTER — Ambulatory Visit (HOSPITAL_COMMUNITY)
Admission: RE | Admit: 2013-12-15 | Discharge: 2013-12-15 | Disposition: A | Discharge status: Home / Self Care | Payer: Medicare HMO | Source: Ambulatory Visit | Attending: Internal Medicine | Admitting: Internal Medicine

## 2013-12-15 DIAGNOSIS — K921 Melena: Secondary | ICD-10-CM

## 2013-12-15 DIAGNOSIS — K319 Disease of stomach and duodenum, unspecified: Secondary | ICD-10-CM | POA: Diagnosis not present

## 2013-12-19 ENCOUNTER — Other Ambulatory Visit (HOSPITAL_COMMUNITY): Payer: Self-pay | Admitting: Oncology

## 2014-01-13 ENCOUNTER — Other Ambulatory Visit: Payer: Self-pay | Admitting: Nurse Practitioner

## 2014-02-07 ENCOUNTER — Other Ambulatory Visit: Payer: Self-pay | Admitting: Cardiology

## 2014-02-07 NOTE — Telephone Encounter (Signed)
[employer] faxed medication confirmation. Pt is only taking Crestor 20 mg daily. Called [employer] with case # [id number] and confirmed crestor 20 mg daily. No longer taking atorvastatin 80 mg.

## 2014-02-07 NOTE — Telephone Encounter (Signed)
Please see refill bin / tgs  °

## 2014-02-22 ENCOUNTER — Encounter (INDEPENDENT_AMBULATORY_CARE_PROVIDER_SITE_OTHER): Payer: Self-pay | Admitting: *Deleted

## 2014-03-05 ENCOUNTER — Ambulatory Visit (INDEPENDENT_AMBULATORY_CARE_PROVIDER_SITE_OTHER): Payer: Medicare HMO | Admitting: Cardiology

## 2014-03-05 ENCOUNTER — Encounter: Payer: Self-pay | Admitting: Cardiology

## 2014-03-05 VITALS — BP 102/72 | HR 95 | Ht 65.0 in | Wt 183.0 lb

## 2014-03-05 DIAGNOSIS — I2510 Atherosclerotic heart disease of native coronary artery without angina pectoris: Secondary | ICD-10-CM

## 2014-03-05 DIAGNOSIS — I10 Essential (primary) hypertension: Secondary | ICD-10-CM

## 2014-03-05 DIAGNOSIS — I4819 Other persistent atrial fibrillation: Secondary | ICD-10-CM

## 2014-03-05 DIAGNOSIS — I481 Persistent atrial fibrillation: Secondary | ICD-10-CM

## 2014-03-05 NOTE — Assessment & Plan Note (Signed)
No active angina symptoms on current medical regimen. Ischemic testing from September noted above with evidence of inferolateral scar but no ischemic zones. Follow-up in 6 months.

## 2014-03-05 NOTE — Patient Instructions (Signed)
Your physician wants you to follow-up in: 6 months You will receive a reminder letter in the mail two months in advance. If you don't receive a letter, please call our office to schedule the follow-up appointment.     Your physician recommends that you continue on your current medications as directed. Please refer to the Current Medication list given to you today.      Thank you for choosing [facility] !        

## 2014-03-05 NOTE — Progress Notes (Signed)
Reason for visit: CAD, atrial fibrillation  Clinical Summary [name] is a medically complex 73 y.o.male last seen in September. He presents for a routine follow-up visit. No reported angina symptoms, he has not used any nitroglycerin since I saw him. Also no sense of palpitations. Following medication adjustments he has had no further episodes of dizziness and near syncope. Blood pressure looks good today. He continues to follow with Donald Gaines for other health concerns.  [facility] Cardiolite in September reported a large inferolateral scar with no clear evidence of ischemia, LVEF calculated at 39%, Echocardiogram also done in September reported mild LVH with LVEF 50-55%, increased LVEDP with indeterminate diastolic function, septal dyssynergy, mild to moderate mitral regurgitation, severe left atrial enlargement, mildly reduced RV contraction, moderate tricuspid regurgitation with PASD 63 mmHg, small pericardial effusion.  He has a history of recurrent GI bleeding due to ulcers and also AVMs, a suboptimal anticoagulation candidate, although CHADSVASC score is 4. He has been on Plavix with prior history of TIA. He does not want to take an anticoagulant.  Allergies  Allergen Reactions  . Penicillins Rash    Current Outpatient Prescriptions  Medication Sig Dispense Refill  . ADVAIR DISKUS 250-50 MCG/DOSE AEPB Inhale 1 puff into the lungs 2 (two) times daily.     Marland Kitchen albuterol (PROAIR HFA) 108 (90 BASE) MCG/ACT inhaler Inhale 2 puffs into the lungs every 6 (six) hours as needed for wheezing or shortness of breath.    . ALPRAZolam (XANAX) 0.5 MG tablet Take 0.5 mg by mouth 2 (two) times daily as needed for anxiety.     . AVODART 0.5 MG capsule Take 0.5 mg by mouth every morning.     . carvedilol (COREG) 6.25 MG tablet Take 6.25 mg by mouth 2 (two) times daily with a meal.    . clopidogrel (PLAVIX) 75 MG tablet Take 1 tablet (75 mg total) by mouth every morning.    . DULoxetine (CYMBALTA) 30 MG  capsule Take 30 mg by mouth daily.     Marland Kitchen escitalopram (LEXAPRO) 10 MG tablet Take 10 mg by mouth daily.    Marland Kitchen FLUoxetine (PROZAC) 20 MG capsule Take 20 mg by mouth 2 (two) times daily.    . furosemide (LASIX) 40 MG tablet Take 40 mg by mouth 2 (two) times daily.     . isosorbide mononitrate (IMDUR) 60 MG 24 hr tablet Take 1 tablet (60 mg total) by mouth daily. 30 tablet 11  . JANUMET 50-1000 MG per tablet Take 1 tablet by mouth 2 (two) times daily with a meal.     . losartan (COZAAR) 25 MG tablet Take 1 tablet (25 mg total) by mouth daily. 90 tablet 3  . NITROSTAT 0.4 MG SL tablet Place 0.4 mg under the tongue every 5 (five) minutes as needed for chest pain.     Marland Kitchen omeprazole (PRILOSEC) 20 MG capsule Take 20 mg by mouth daily. 2 tabs daily    . rosuvastatin (CRESTOR) 20 MG tablet Take 1 tablet (20 mg total) by mouth daily. 90 tablet 0  . traMADol (ULTRAM) 50 MG tablet Take 50 mg by mouth every 6 (six) hours as needed.     No current facility-administered medications for this visit.    Past Medical History  Diagnosis Date  . TIA (transient ischemic attack)     2005  . GERD (gastroesophageal reflux disease)   . Hyperlipidemia   . Essential hypertension, benign   . Depression   . Diabetes mellitus, type  2   . Restrictive lung disease   . Candida esophagitis   . Hemorrhoids   . Arteriovenous malformation small bowel     Capsule study 3/10  . GI bleeding     Cecal ulcers, arteriovenous malformations  . Chronic diarrhea SEP 2011    ?ETIOLOGY-DIABETIC ENTEROPATHY, LACTOSE INTOLERANCE,  OR IBS-MIXED  . Bloating AUG 2009    NL HBT FOR SIBO  . Anemia FEB 2008 FEDA 2o to AVMs, & large colon ulcers    HB 10.7 MCV 74.3; Dr Tressie Stalker  . BPH (benign prostatic hypertrophy)     Dr Maryland Pink  . Glaucoma   . Cataract   . Bilateral carotid artery stenosis     50-75% external left carotid stenosis  . Coronary atherosclerosis of native coronary artery     Multivessel, BMS SVG TO RCA 2000,  reportedly  "2" additional stents in interim  . Myocardial infarction     1994  . COPD (chronic obstructive pulmonary disease)   . Sleep apnea   . Portal hypertensive gastropathy 11/08/2012    EGD. Dr. Laural Golden  . Dementia     Social History Donald Gaines reports that he has been smoking Cigarettes.  He has a 60 pack-year smoking history. He has never used smokeless tobacco. Donald Gaines reports that he does not drink alcohol.  Review of Systems Complete review of systems negative except as otherwise outlined in the clinical summary and also the following. Complains of chronic knee pain. No recent falls. Uses a cane.  Physical Examination Filed Vitals:   03/05/14 1117  BP: 102/72  Pulse: 95   Filed Weights   03/05/14 1117  Weight: 183 lb (83.008 kg)    No distress. HEENT: Conjunctiva and lids normal, oropharynx with poor dentition.  Neck: Supple, no elevated JVP or bruits, or thyromegaly.  Lungs: Clear to auscultation, nonlabored.  Cardiac: Irregularly irregular, indistinct PMI, soft basal systolic murmur, no S3 gallop.  Abdomen: Protuberant, obese, bowel sounds present, no tenderness.  Skin: Warm and dry, scattered tattoos noted.  Extremities: Trace ankle edema, distal pulses one plus.  Musculoskeletal: No kyphosis.  Neuropsychiatric: Alert and oriented x3, affect appropriate.   Problem List and Plan   Coronary atherosclerosis of native coronary artery No active angina symptoms on current medical regimen. Ischemic testing from September noted above with evidence of inferolateral scar but no ischemic zones. Follow-up in 6 months.  Essential hypertension, benign Blood pressure stable. No changes made today.  Persistent atrial fibrillation He has a history of recurrent GI bleeding due to ulcers and also AVMs, a suboptimal anticoagulation candidate, although CHADSVASC score is 4. He has been on Plavix with prior history of TIA. He does not want to take an  anticoagulant. Good control of palpitations.    Satira Sark, M.D., F.A.C.C.

## 2014-03-05 NOTE — Assessment & Plan Note (Signed)
He has a history of recurrent GI bleeding due to ulcers and also AVMs, a suboptimal anticoagulation candidate, although CHADSVASC score is 4. He has been on Plavix with prior history of TIA. He does not want to take an anticoagulant. Good control of palpitations.

## 2014-03-05 NOTE — Assessment & Plan Note (Signed)
Blood pressure stable. No changes made today.

## 2014-03-26 ENCOUNTER — Ambulatory Visit (INDEPENDENT_AMBULATORY_CARE_PROVIDER_SITE_OTHER): Payer: Medicare HMO | Admitting: Internal Medicine

## 2014-04-03 ENCOUNTER — Telehealth (INDEPENDENT_AMBULATORY_CARE_PROVIDER_SITE_OTHER): Payer: Self-pay | Admitting: *Deleted

## 2014-04-03 ENCOUNTER — Encounter (INDEPENDENT_AMBULATORY_CARE_PROVIDER_SITE_OTHER): Payer: Self-pay | Admitting: Internal Medicine

## 2014-04-03 ENCOUNTER — Ambulatory Visit (INDEPENDENT_AMBULATORY_CARE_PROVIDER_SITE_OTHER): Payer: Medicare HMO | Admitting: Internal Medicine

## 2014-04-03 VITALS — BP 110/70 | HR 64 | Temp 97.2°F | Resp 16 | Ht 65.0 in | Wt 176.7 lb

## 2014-04-03 DIAGNOSIS — D509 Iron deficiency anemia, unspecified: Secondary | ICD-10-CM | POA: Diagnosis not present

## 2014-04-03 DIAGNOSIS — Z8719 Personal history of other diseases of the digestive system: Secondary | ICD-10-CM | POA: Diagnosis not present

## 2014-04-03 DIAGNOSIS — K7400 Hepatic fibrosis, unspecified: Secondary | ICD-10-CM

## 2014-04-03 DIAGNOSIS — K219 Gastro-esophageal reflux disease without esophagitis: Secondary | ICD-10-CM | POA: Diagnosis not present

## 2014-04-03 LAB — HEMOGLOBIN AND HEMATOCRIT, BLOOD
HEMATOCRIT: 29.5 % — AB (ref 39.0–52.0)
Hemoglobin: 8.8 g/dL — ABNORMAL LOW (ref 13.0–17.0)

## 2014-04-03 MED ORDER — FLINTSTONES PLUS IRON PO CHEW: 1.0000 | CHEWABLE_TABLET | Freq: Two times a day (BID) | ORAL | Status: DC

## 2014-04-03 NOTE — Patient Instructions (Addendum)
Hemoccult 1 now and repeat if stools turned black. Take omeprazole 40 mg by mouth 30 minutes before breakfast daily. Can take Tylenol on as-needed basis. Dose not to exceed 2 g per day. Remember you cannot take aspirin Advil or Aleve and similar medications. Physician will call with results of blood tests.

## 2014-04-03 NOTE — Progress Notes (Addendum)
Presenting complaint;  Follow-up for iron deficiency anemia, GI bleed and GERD.  Subjective:  Patient is 74 year old Caucasian male with multiple medical problems who presents for scheduled visit. He was last seen on 12/12/2013. He states he had tarry stools 3 weeks ago lasting for 2 days. However he has not had rectal bleeding nausea vomiting or hematemesis. Only time he has nausea vomiting is if he drinks ice water. His appetite is poor but he has not lost any weight. He also complains of daily heartburn. He is on omeprazole twice daily. He states he fell yesterday when his feet got tangled up. He does not believe he passed out. He experienced left rib cage pain but it has resolved. He also sustained abrasion to his right forearm. He continues to smoke cigarettes. He lives with his sister Miss [name].  Current Medications: Outpatient Encounter Prescriptions as of 04/03/2014  Medication Sig  . ADVAIR DISKUS 250-50 MCG/DOSE AEPB Inhale 1 puff into the lungs 2 (two) times daily.   Marland Kitchen albuterol (PROAIR HFA) 108 (90 BASE) MCG/ACT inhaler Inhale 2 puffs into the lungs every 6 (six) hours as needed for wheezing or shortness of breath.  . ALPRAZolam (XANAX) 0.5 MG tablet Take 0.5 mg by mouth 2 (two) times daily as needed for anxiety.   . AVODART 0.5 MG capsule Take 0.5 mg by mouth every morning.   . carvedilol (COREG) 12.5 MG tablet Take 12.5 mg by mouth 2 (two) times daily with a meal.   . clopidogrel (PLAVIX) 75 MG tablet Take 1 tablet (75 mg total) by mouth every morning.  . furosemide (LASIX) 40 MG tablet Take 40 mg by mouth 2 (two) times daily.   Marland Kitchen HYDROcodone-acetaminophen (NORCO/VICODIN) 5-325 MG per tablet Take 1 tablet by mouth at bedtime.   . isosorbide mononitrate (IMDUR) 60 MG 24 hr tablet Take 1 tablet (60 mg total) by mouth daily.  Marland Kitchen JANUMET 50-1000 MG per tablet Take 1 tablet by mouth 2 (two) times daily with a meal.   . losartan (COZAAR) 25 MG tablet Take 1 tablet (25 mg total) by  mouth daily.  Marland Kitchen NITROSTAT 0.4 MG SL tablet Place 0.4 mg under the tongue every 5 (five) minutes as needed for chest pain.   Marland Kitchen omeprazole (PRILOSEC) 20 MG capsule Take 20 mg by mouth daily. 2 tabs daily  . rosuvastatin (CRESTOR) 20 MG tablet Take 1 tablet (20 mg total) by mouth daily.  . [DISCONTINUED] carvedilol (COREG) 6.25 MG tablet Take 12.5 mg by mouth 2 (two) times daily with a meal.   . [DISCONTINUED] DULoxetine (CYMBALTA) 30 MG capsule Take 30 mg by mouth daily.   . [DISCONTINUED] escitalopram (LEXAPRO) 10 MG tablet Take 10 mg by mouth daily.  . [DISCONTINUED] FLUoxetine (PROZAC) 20 MG capsule Take 20 mg by mouth 2 (two) times daily.  . [DISCONTINUED] traMADol (ULTRAM) 50 MG tablet Take 50 mg by mouth every 6 (six) hours as needed.     Objective: Blood pressure 110/70, pulse 64, temperature 97.2 F (36.2 C), temperature source Oral, resp. rate 16, height 5\' 5"  (1.651 m), weight 176 lb 11.2 oz (80.151 kg). Patient is alert and he appears to be pale. He does not have asterixis. Conjunctiva is pink. Sclera is nonicteric Oropharyngeal mucosa is normal. No neck masses or thyromegaly noted. No acanthosis or chest wall tenderness noted. Cardiac exam with irregular rhythm normal S1 and S2. Faint SEM noted at sternal border. Lungs are clear to auscultation. Abdomen is full but soft and nontender  without organomegaly or masses. No LE edema or clubbing noted.  Labs/studies Results: Lab data from 01/31/2014 (Dr. Edwyna Ready calls office0. WBC 5.8, H&H 9 and 30.3 MCV 78.7 and platelet count 265K Serum sodium 141, potassium 3.0, chloride 100, CO2 30, BUN 13 and creatinine 1.05 Glucose 116 Bilirubin 0.5, AP 94, AST 16, ALT less than 8, total protein 6.7 and albumin 3.9 and serum calcium 9.0 Spleen 12 cm/upper limit of normal. No evidence of ascites. Ultrasound with elastography on 12/15/2013 Gallstones without gallbladder wall thickening.  Median hepatic Sharyl wave velocity 3.1 m/sec which  translates to matter severe fibrosis score between F3 and F4.  Assessment:  #1. Iron deficiency anemia most likely secondary to chronic and/or intermittent blood loss from small bowel vascular abnormalities. He underwent EGD colonoscopy in given capsule study back in March 2014. H&H remains low. #2. GERD. Heartburn is not well controlled with omeprazole 20 mg twice a day. He should not be taking evening dose of PPI while on Plavix. #3. Hepatic fibrosis. Suspect he has early cirrhosis based on elastography. This condition was suspected when he was found to have portal gastropathy on EGD and he also has mild splenomegaly. Hepatic function appears to be well preserved. Will do limited workup to determine cause of hepatic fibrosis.   Plan:  Hemoccult 1. H&H this week. Hepatitis B surface antigen and hepatitis C virus antibody. Patient advised to take omeprazole 40 mg every morning for breakfast. Patient informed that he can take Tylenol on as-needed basis no more than 2 g on a given day but he should've frame from using aspirin Aleve or other OTC NSAIDs. Office visit in the months.

## 2014-04-03 NOTE — Telephone Encounter (Signed)
Per Dr.Rehman the patient will need to have labs drawn. 

## 2014-04-04 LAB — HEPATITIS C ANTIBODY: HCV Ab: NEGATIVE

## 2014-04-04 LAB — HEPATITIS B SURFACE ANTIGEN: HEP B S AG: NEGATIVE

## 2014-04-04 NOTE — Telephone Encounter (Signed)
Lab order completed

## 2014-04-10 ENCOUNTER — Telehealth (INDEPENDENT_AMBULATORY_CARE_PROVIDER_SITE_OTHER): Payer: Self-pay | Admitting: *Deleted

## 2014-04-10 DIAGNOSIS — D509 Iron deficiency anemia, unspecified: Secondary | ICD-10-CM

## 2014-04-10 NOTE — Telephone Encounter (Signed)
Per Dr.Rehman the patient will need to have labs drawn in 4 weeks.   

## 2014-04-11 ENCOUNTER — Other Ambulatory Visit (INDEPENDENT_AMBULATORY_CARE_PROVIDER_SITE_OTHER): Payer: Self-pay | Admitting: *Deleted

## 2014-04-11 ENCOUNTER — Encounter (INDEPENDENT_AMBULATORY_CARE_PROVIDER_SITE_OTHER): Payer: Self-pay | Admitting: *Deleted

## 2014-04-11 DIAGNOSIS — D509 Iron deficiency anemia, unspecified: Secondary | ICD-10-CM

## 2014-04-19 ENCOUNTER — Encounter (INDEPENDENT_AMBULATORY_CARE_PROVIDER_SITE_OTHER): Payer: Self-pay

## 2014-06-06 ENCOUNTER — Encounter (HOSPITAL_COMMUNITY): Payer: Self-pay | Admitting: *Deleted

## 2014-06-06 ENCOUNTER — Emergency Department (HOSPITAL_COMMUNITY): Payer: Medicare HMO

## 2014-06-06 ENCOUNTER — Emergency Department (HOSPITAL_COMMUNITY)
Admission: EM | Admit: 2014-06-06 | Discharge: 2014-06-06 | Disposition: A | Discharge status: Home / Self Care | Payer: Medicare HMO | Attending: Emergency Medicine | Admitting: Emergency Medicine

## 2014-06-06 DIAGNOSIS — Z720 Tobacco use: Secondary | ICD-10-CM | POA: Diagnosis not present

## 2014-06-06 DIAGNOSIS — I10 Essential (primary) hypertension: Secondary | ICD-10-CM | POA: Diagnosis not present

## 2014-06-06 DIAGNOSIS — Z7902 Long term (current) use of antithrombotics/antiplatelets: Secondary | ICD-10-CM | POA: Diagnosis not present

## 2014-06-06 DIAGNOSIS — Z862 Personal history of diseases of the blood and blood-forming organs and certain disorders involving the immune mechanism: Secondary | ICD-10-CM | POA: Insufficient documentation

## 2014-06-06 DIAGNOSIS — E119 Type 2 diabetes mellitus without complications: Secondary | ICD-10-CM | POA: Insufficient documentation

## 2014-06-06 DIAGNOSIS — Z8669 Personal history of other diseases of the nervous system and sense organs: Secondary | ICD-10-CM | POA: Insufficient documentation

## 2014-06-06 DIAGNOSIS — Z87438 Personal history of other diseases of male genital organs: Secondary | ICD-10-CM | POA: Diagnosis not present

## 2014-06-06 DIAGNOSIS — Z79899 Other long term (current) drug therapy: Secondary | ICD-10-CM | POA: Insufficient documentation

## 2014-06-06 DIAGNOSIS — Z880 Allergy status to penicillin: Secondary | ICD-10-CM | POA: Diagnosis not present

## 2014-06-06 DIAGNOSIS — F329 Major depressive disorder, single episode, unspecified: Secondary | ICD-10-CM | POA: Insufficient documentation

## 2014-06-06 DIAGNOSIS — Z87738 Personal history of other specified (corrected) congenital malformations of digestive system: Secondary | ICD-10-CM | POA: Insufficient documentation

## 2014-06-06 DIAGNOSIS — R0789 Other chest pain: Secondary | ICD-10-CM

## 2014-06-06 DIAGNOSIS — J209 Acute bronchitis, unspecified: Secondary | ICD-10-CM | POA: Diagnosis not present

## 2014-06-06 DIAGNOSIS — R079 Chest pain, unspecified: Secondary | ICD-10-CM | POA: Diagnosis present

## 2014-06-06 DIAGNOSIS — Z9889 Other specified postprocedural states: Secondary | ICD-10-CM | POA: Insufficient documentation

## 2014-06-06 DIAGNOSIS — F0390 Unspecified dementia without behavioral disturbance: Secondary | ICD-10-CM | POA: Diagnosis not present

## 2014-06-06 DIAGNOSIS — Z9861 Coronary angioplasty status: Secondary | ICD-10-CM | POA: Insufficient documentation

## 2014-06-06 DIAGNOSIS — I252 Old myocardial infarction: Secondary | ICD-10-CM | POA: Diagnosis not present

## 2014-06-06 DIAGNOSIS — I2510 Atherosclerotic heart disease of native coronary artery without angina pectoris: Secondary | ICD-10-CM | POA: Diagnosis not present

## 2014-06-06 DIAGNOSIS — Z8619 Personal history of other infectious and parasitic diseases: Secondary | ICD-10-CM | POA: Insufficient documentation

## 2014-06-06 DIAGNOSIS — Z8673 Personal history of transient ischemic attack (TIA), and cerebral infarction without residual deficits: Secondary | ICD-10-CM | POA: Diagnosis not present

## 2014-06-06 DIAGNOSIS — J40 Bronchitis, not specified as acute or chronic: Secondary | ICD-10-CM

## 2014-06-06 DIAGNOSIS — E785 Hyperlipidemia, unspecified: Secondary | ICD-10-CM | POA: Insufficient documentation

## 2014-06-06 DIAGNOSIS — J441 Chronic obstructive pulmonary disease with (acute) exacerbation: Secondary | ICD-10-CM | POA: Diagnosis not present

## 2014-06-06 LAB — CBC
HEMATOCRIT: 30.6 % — AB (ref 39.0–52.0)
HEMOGLOBIN: 8.6 g/dL — AB (ref 13.0–17.0)
MCH: 24 pg — AB (ref 26.0–34.0)
MCHC: 28.1 g/dL — ABNORMAL LOW (ref 30.0–36.0)
MCV: 85.2 fL (ref 78.0–100.0)
Platelets: 244 10*3/uL (ref 150–400)
RBC: 3.59 MIL/uL — AB (ref 4.22–5.81)
RDW: 20.9 % — ABNORMAL HIGH (ref 11.5–15.5)
WBC: 6 10*3/uL (ref 4.0–10.5)

## 2014-06-06 LAB — BASIC METABOLIC PANEL
Anion gap: 7 (ref 5–15)
BUN: 9 mg/dL (ref 6–23)
CO2: 29 mmol/L (ref 19–32)
Calcium: 8.9 mg/dL (ref 8.4–10.5)
Chloride: 105 mmol/L (ref 96–112)
Creatinine, Ser: 1.07 mg/dL (ref 0.50–1.35)
GFR calc Af Amer: 79 mL/min — ABNORMAL LOW (ref >90)
GFR, EST NON AFRICAN AMERICAN: 68 mL/min — AB (ref >90)
GLUCOSE: 134 mg/dL — AB (ref 70–99)
POTASSIUM: 3 mmol/L — AB (ref 3.5–5.1)
SODIUM: 141 mmol/L (ref 135–145)

## 2014-06-06 LAB — HEPATIC FUNCTION PANEL
ALBUMIN: 3.5 g/dL (ref 3.5–5.2)
ALK PHOS: 111 U/L (ref 39–117)
ALT: 13 U/L (ref 0–53)
AST: 26 U/L (ref 0–37)
Bilirubin, Direct: 0.3 mg/dL (ref 0.0–0.5)
Indirect Bilirubin: 0.5 mg/dL (ref 0.3–0.9)
Total Bilirubin: 0.8 mg/dL (ref 0.3–1.2)
Total Protein: 6.8 g/dL (ref 6.0–8.3)

## 2014-06-06 LAB — TROPONIN I
Troponin I: <0.03 ng/mL (ref <0.031)
Troponin I: <0.03 ng/mL (ref <0.031)

## 2014-06-06 LAB — BRAIN NATRIURETIC PEPTIDE: B Natriuretic Peptide: 575 pg/mL — ABNORMAL HIGH (ref 0.0–100.0)

## 2014-06-06 MED ORDER — LEVOFLOXACIN 500 MG PO TABS: 500.0000 mg | ORAL_TABLET | Freq: Every day | ORAL | Status: DC

## 2014-06-06 MED ORDER — POTASSIUM CHLORIDE CRYS ER 20 MEQ PO TBCR
40.0000 meq | EXTENDED_RELEASE_TABLET | Freq: Once | ORAL | Status: AC
  Administered 2014-06-06: 40 meq via ORAL
  Filled 2014-06-06: qty 2

## 2014-06-06 MED ORDER — HYDROCODONE-ACETAMINOPHEN 5-325 MG PO TABS: 1.0000 | ORAL_TABLET | Freq: Four times a day (QID) | ORAL | Status: DC | PRN

## 2014-06-06 MED ORDER — LEVOFLOXACIN 500 MG PO TABS
500.0000 mg | ORAL_TABLET | Freq: Once | ORAL | Status: AC
  Administered 2014-06-06: 500 mg via ORAL
  Filled 2014-06-06: qty 1

## 2014-06-06 MED ORDER — SODIUM CHLORIDE 0.9 % IJ SOLN
INTRAMUSCULAR | Status: AC
  Filled 2014-06-06: qty 1000

## 2014-06-06 MED ORDER — IOHEXOL 300 MG/ML  SOLN
80.0000 mL | Freq: Once | INTRAMUSCULAR | Status: AC | PRN
  Administered 2014-06-06: 80 mL via INTRAVENOUS

## 2014-06-06 NOTE — ED Notes (Signed)
Chest pain, sob for 1 week, pale.

## 2014-06-06 NOTE — Discharge Instructions (Signed)
Follow up with your md  As scheduled friday

## 2014-06-06 NOTE — ED Provider Notes (Signed)
CSN: [id number]     Arrival date & time 06/06/14  1620 History  This chart was scribed for Milton Degen, MD by Tula Nakayama, ED Scribe. This patient was seen in room APA10/[facility]10 and the patient's care was started at 5:10 PM.    Chief Complaint  Patient presents with  . Chest Pain    Patient is a 74 y.o. male presenting with chest pain. The history is provided by the patient. No language interpreter was used.  Chest Pain Pain location:  Substernal area Pain quality: pressure   Pain radiates to:  Does not radiate Pain radiates to the back: no   Pain severity:  Moderate Onset quality:  Gradual Duration:  1 week Timing:  Constant Progression:  Unchanged Chronicity:  New Relieved by:  Nothing Worsened by:  Nothing tried Ineffective treatments:  None tried Associated symptoms: cough, headache and shortness of breath   Associated symptoms: no abdominal pain, no back pain and no fatigue   Risk factors: coronary artery disease and smoking     HPI Comments: CHANCY BOLMAN is a 74 y.o. male with a history of A-fib, CABG, HTN, DM, TIA and COPD who presents to the Emergency Department complaining of constant, moderate chest pressure that started 1 week ago. He states SOB and productive cough with green and yellow sputum as associated symptoms. Pt also reports burning right leg pain and right-sided HA between his ear and his temple. Pt denies pain with deep breaths. He takes Plavix and uses 2.5 L/min of O2 at home. Pt smokes cigarettes.  Past Medical History  Diagnosis Date  . TIA (transient ischemic attack)     2005  . GERD (gastroesophageal reflux disease)   . Hyperlipidemia   . Essential hypertension, benign   . Depression   . Diabetes mellitus, type 2   . Restrictive lung disease   . Candida esophagitis   . Hemorrhoids   . Arteriovenous malformation small bowel     Capsule study 3/10  . GI bleeding     Cecal ulcers, arteriovenous malformations  . Chronic diarrhea SEP 2011     ?ETIOLOGY-DIABETIC ENTEROPATHY, LACTOSE INTOLERANCE,  OR IBS-MIXED  . Bloating AUG 2009    NL HBT FOR SIBO  . Anemia FEB 2008 FEDA 2o to AVMs, & large colon ulcers    HB 10.7 MCV 74.3; Dr Tressie Stalker  . BPH (benign prostatic hypertrophy)     Dr [place] Pink  . Glaucoma   . Cataract   . Bilateral carotid artery stenosis     50-75% external left carotid stenosis  . Coronary atherosclerosis of native coronary artery     Multivessel, BMS SVG TO RCA 2000, reportedly  "2" additional stents in interim  . Myocardial infarction     1994  . COPD (chronic obstructive pulmonary disease)   . Sleep apnea   . Portal hypertensive gastropathy 11/08/2012    EGD. Dr. Laural Golden  . Dementia    Past Surgical History  Procedure Laterality Date  . Esophagogastroduodenoscopy  SEP 09    DUODENAL LIPOMA  . Capsule endo  03/10  . Right inguinal hernia repair    . Hydrogen breath test  2009  . Colonoscopy  APR 2008    SIMPLE ADENOMA, [place] [facility], IH  . Colonoscopy  FEB 2008 ANEMIA. [facility]LENA     2 LARGE ILEOCEAL ULCERS 2o to ASA, [place] TICS, IH  . Colonoscopy  SEP 2009 TRANSFUSION DEP ANEMIA    AC AVM-ABLATED, [place] TICS, IH  . Upper gastrointestinal  endoscopy  SEP 2009    GASTRIC AVM ABLATED, NL DUO Bx  . Cardiac catherization  04/2012  . Cardiac catheterization  04/05/1996 [facility]    EF 45%, occluded SVG to circumflex, patent SVG to D1 and PDA and patent LIMA to LAD with severe native vessel disease.  . Cardiac catheterization  08/22/1998 [facility]    Mild LM, severe LAD,severe CX, occlude RCA, patient  SVG to diatal RCA, patent vein graft to D1 and patent LIMA to LAD.   Marland Kitchen Coronary angioplasty with stent placement  08/22/1998 [name]    TEC stenting of the SVG to RCA-Last seen by cardiologist in 2010.  Marland Kitchen Coronary artery bypass graft  1995-triple bypass    LIMA-LAD, LIMA to intermediate branch, SVG to PD and second OM,  . Cataract extraction w/phaco Right 09/22/2012    Procedure: CATARACT EXTRACTION PHACO AND  INTRAOCULAR LENS PLACEMENT (IOC);  Surgeon: Tonny Branch, MD;  Location: [facility] ORS;  Service: Ophthalmology;  Laterality: Right;  CDE: 11.43  . Cataract extraction w/phaco Left 10/17/2012    Procedure: CATARACT EXTRACTION PHACO AND INTRAOCULAR LENS PLACEMENT (IOC);  Surgeon: Tonny Branch, MD;  Location: [facility] OR[facility];  Service: Ophthalmology;  Laterality: Left;  CDE: 8.06  . Colonoscopy with esophagogastroduodenoscopy (egd) N/A 11/08/2012    Procedure: COLONOSCOPY WITH ESOPHAGOGASTRODUODENOSCOPY (EGD);  Surgeon: Rogene Houston, MD;  Location: [facility];  Service: Endoscopy;  Laterality: N/A;  250-rescheduled to 7:30 Ann to notify pt  . [place]ivens capsule study N/A 11/24/2012    Procedure: GIVENS CAPSULE STUDY;  Surgeon: Rogene Houston, MD;  Location: [facility];  Service: Endoscopy;  Laterality: N/A;  730  . Esophagogastroduodenoscopy N/A 09/27/2013    Procedure: ESOPHAGOGASTRODUODENOSCOPY (EGD) Push enteroscopy;  Surgeon: Rogene Houston, MD;  Location: [facility];  Service: Endoscopy;  Laterality: N/A;  . Hot hemostasis  09/27/2013    Procedure: HOT HEMOSTASIS (ARGON PLASMA COAGULATION/BICAP);  Surgeon: Rogene Houston, MD;  Location: [facility];  Service: Endoscopy;;   Family History  Problem Relation Age of Onset  . Heart attack Mother   . Heart disease Mother   . Hypertension Mother    History  Substance Use Topics  . Smoking status: Current Every Day Smoker -- 1.00 packs/day for 60 years    Types: Cigarettes  . Smokeless tobacco: Never Used     Comment: 1 pack a day  . Alcohol Use: No     Comment: No etoh in 12 yrs.    Review of Systems  Constitutional: Negative for appetite change and fatigue.  HENT: Negative for congestion, ear discharge and sinus pressure.   Eyes: Negative for discharge.  Respiratory: Positive for cough and shortness of breath.   Cardiovascular: Positive for chest pain.  Gastrointestinal: Negative for abdominal pain and diarrhea.  Genitourinary: Negative for  frequency and hematuria.  Musculoskeletal: Negative for back pain.  Skin: Negative for rash.  Neurological: Positive for headaches. Negative for seizures.  Psychiatric/Behavioral: Negative for hallucinations.  All other systems reviewed and are negative.     Allergies  Penicillins  Home Medications   Prior to Admission medications   Medication Sig Start Date End Date Taking? Authorizing Provider  ADVAIR DISKUS 250-50 MCG/DOSE AEPB Inhale 1 puff into the lungs 2 (two) times daily.  10/02/10   Historical Provider, MD  albuterol (PROAIR HFA) 108 (90 BASE) MCG/ACT inhaler Inhale 2 puffs into the lungs every 6 (six) hours as needed for wheezing or shortness of breath.  Historical Provider, MD  ALPRAZolam Duanne Moron) 0.5 MG tablet Take 0.5 mg by mouth 2 (two) times daily as needed for anxiety.     Historical Provider, MD  AVODART 0.5 MG capsule Take 0.5 mg by mouth every morning.  11/21/12   Historical Provider, MD  carvedilol (COREG) 12.5 MG tablet Take 12.5 mg by mouth 2 (two) times daily with a meal.  03/26/14   Historical Provider, MD  clopidogrel (PLAVIX) 75 MG tablet Take 1 tablet (75 mg total) by mouth every morning. 09/27/13   Nita Sells, MD  furosemide (LASIX) 40 MG tablet Take 40 mg by mouth 2 (two) times daily.     Historical Provider, MD  HYDROcodone-acetaminophen (NORCO/VICODIN) 5-325 MG per tablet Take 1 tablet by mouth at bedtime.  03/26/14   Historical Provider, MD  isosorbide mononitrate (IMDUR) 60 MG 24 hr tablet Take 1 tablet (60 mg total) by mouth daily. 06/01/13   Lendon Colonel, NP  JANUMET 50-1000 MG per tablet Take 1 tablet by mouth 2 (two) times daily with a meal.  07/22/13   Historical Provider, MD  losartan (COZAAR) 25 MG tablet Take 1 tablet (25 mg total) by mouth daily. 12/04/13   Satira Sark, MD  NITROSTAT 0.4 MG SL tablet Place 0.4 mg under the tongue every 5 (five) minutes as needed for chest pain.  05/23/10   Historical Provider, MD  omeprazole (PRILOSEC) 20  MG capsule Take 20 mg by mouth daily. 2 tabs daily 08/28/13   Historical Provider, MD  Pediatric Multivitamins-Iron (FLINTSTONES PLUS IRON) chewable tablet Chew 1 tablet by mouth 2 (two) times daily. 04/03/14   Rogene Houston, MD  rosuvastatin (CRESTOR) 20 MG tablet Take 1 tablet (20 mg total) by mouth daily. 12/12/13   Satira Sark, MD   BP 151/74 mmHg  Pulse 90  Temp(Src) 97.7 F (36.5 C) (Oral)  Resp 20  Ht 5\' 6"  (1.676 m)  Wt 178 lb (80.74 kg)  BMI 28.74 kg/m2  SpO2 96% Physical Exam  Constitutional: He is oriented to person, place, and time. He appears well-developed.  HENT:  Head: Normocephalic.  Eyes: Conjunctivae and EOM are normal. No scleral icterus.  Neck: Neck supple. No thyromegaly present.  Cardiovascular: Normal rate.  Exam reveals no gallop and no friction rub.   No murmur heard. Irregular rhythm  Pulmonary/Chest: No stridor. He has wheezes. He has no rales. He exhibits no tenderness.  Mild wheezing bilaterally; well-healed incision on his sternum  Abdominal: He exhibits no distension. There is no tenderness. There is no rebound.  Musculoskeletal: Normal range of motion. He exhibits no edema.  Lymphadenopathy:    He has no cervical adenopathy.  Neurological: He is oriented to person, place, and time. He exhibits normal muscle tone. Coordination normal.  Skin: No rash noted. No erythema.  Psychiatric: He has a normal mood and affect. His behavior is normal.  Nursing note and vitals reviewed.   ED Course  Procedures   DIAGNOSTIC STUDIES: Oxygen Saturation is 96% on [place], normal by my interpretation.    COORDINATION OF CARE: 5:16 PM Discussed treatment plan with pt at bedside and pt agreed to plan.  Labs Review Labs Reviewed - No data to display  Imaging Review No results found.   EKG Interpretation None      MDM   Final diagnoses:  None    Bronchitis and chest pain.  Nl troponin.  Will tx with levaquin and follow up with pcp  The chart was  scribed  for me under my direct supervision.  I personally performed the history, physical, and medical decision making and all procedures in the evaluation of this patient.Milton Shipley, MD 06/06/14 [phone]

## 2014-07-03 ENCOUNTER — Encounter (INDEPENDENT_AMBULATORY_CARE_PROVIDER_SITE_OTHER): Payer: Self-pay | Admitting: Internal Medicine

## 2014-07-03 ENCOUNTER — Ambulatory Visit (INDEPENDENT_AMBULATORY_CARE_PROVIDER_SITE_OTHER): Payer: Medicare HMO | Admitting: Internal Medicine

## 2014-07-03 VITALS — BP 120/72 | HR 66 | Temp 97.7°F | Resp 16 | Ht 65.0 in | Wt 182.5 lb

## 2014-07-03 DIAGNOSIS — R195 Other fecal abnormalities: Secondary | ICD-10-CM

## 2014-07-03 DIAGNOSIS — K219 Gastro-esophageal reflux disease without esophagitis: Secondary | ICD-10-CM | POA: Diagnosis not present

## 2014-07-03 DIAGNOSIS — D509 Iron deficiency anemia, unspecified: Secondary | ICD-10-CM | POA: Diagnosis not present

## 2014-07-03 DIAGNOSIS — K7400 Hepatic fibrosis, unspecified: Secondary | ICD-10-CM

## 2014-07-03 LAB — IRON AND TIBC
%SAT: 18 % — AB (ref 20–55)
Iron: 85 ug/dL (ref 42–165)
TIBC: 480 ug/dL — ABNORMAL HIGH (ref 215–435)
UIBC: 395 ug/dL (ref 125–400)

## 2014-07-03 LAB — FERRITIN: Ferritin: 13 ng/mL — ABNORMAL LOW (ref 22–322)

## 2014-07-03 NOTE — Patient Instructions (Signed)
Stop taking iron pills. Will arrange for iron infusion at day hospital. Physician will call with results of blood tests

## 2014-07-04 ENCOUNTER — Telehealth (INDEPENDENT_AMBULATORY_CARE_PROVIDER_SITE_OTHER): Payer: Self-pay | Admitting: *Deleted

## 2014-07-04 DIAGNOSIS — K922 Gastrointestinal hemorrhage, unspecified: Secondary | ICD-10-CM

## 2014-07-04 LAB — HEMOGLOBIN AND HEMATOCRIT, BLOOD
HCT: 32.9 % — ABNORMAL LOW (ref 39.0–52.0)
Hemoglobin: 9.8 g/dL — ABNORMAL LOW (ref 13.0–17.0)

## 2014-07-04 NOTE — Progress Notes (Addendum)
Presenting complaint;  Follow-up for iron deficiency anemia and occult GI bleeding.  Subjective:  H&E 74 year old Caucasian male who is here for scheduled visit. He was last seen 3 months ago. He he does not feel well. He feels tired and fatigued all the time. He states his stools are dark/black because he is taking iron. He has not seen any bright red blood per rectum. He says heartburns well controlled with therapy. He does not have a good appetite. However he has gained 6 pounds since his last visit. He has intermittent lower abdominal pain which is relieved with defecation. He complains of pain at right temple if he lies on his right side at night. He states he had remote injury. He also complains of bilateral leg pain. He tells me his skin is very tight and he cannot pinched anymore. He states he feels depressed and often cries. He continues to smoke cigarettes. He smoking pack a day. He was seen in emergency room about 4 weeks ago for chest pain and troponin level was negative. He had lab studies done. He does not take OTC NSAIDs.   Current Medications: Outpatient Encounter Prescriptions as of 07/03/2014  Medication Sig  . ADVAIR DISKUS 250-50 MCG/DOSE AEPB Inhale 1 puff into the lungs 2 (two) times daily.   Marland Kitchen albuterol (PROAIR HFA) 108 (90 BASE) MCG/ACT inhaler Inhale 2 puffs into the lungs every 6 (six) hours as needed for wheezing or shortness of breath.  . ALPRAZolam (XANAX) 0.5 MG tablet Take 0.5 mg by mouth 2 (two) times daily as needed for anxiety.   . carvedilol (COREG) 12.5 MG tablet Take 12.5 mg by mouth 2 (two) times daily with a meal.   . clopidogrel (PLAVIX) 75 MG tablet Take 1 tablet (75 mg total) by mouth every morning.  . furosemide (LASIX) 40 MG tablet Take 40 mg by mouth 2 (two) times daily.   Marland Kitchen HYDROcodone-acetaminophen (NORCO/VICODIN) 5-325 MG per tablet Take 1 tablet by mouth every 6 (six) hours as needed.  . isosorbide mononitrate (IMDUR) 60 MG 24 hr tablet Take 1  tablet (60 mg total) by mouth daily.  Marland Kitchen JANUMET 50-1000 MG per tablet Take 1 tablet by mouth 2 (two) times daily with a meal.   . losartan (COZAAR) 25 MG tablet Take 1 tablet (25 mg total) by mouth daily.  Marland Kitchen NITROSTAT 0.4 MG SL tablet Place 0.4 mg under the tongue every 5 (five) minutes as needed for chest pain.   Marland Kitchen omeprazole (PRILOSEC) 20 MG capsule Take 20 mg by mouth daily.   . rosuvastatin (CRESTOR) 20 MG tablet Take 1 tablet (20 mg total) by mouth daily.  . [DISCONTINUED] Fe Fum-Fe Poly-Vit C-Lactobac (FUSION PO) Take 1 tablet by mouth daily.  . [DISCONTINUED] levofloxacin (LEVAQUIN) 500 MG tablet Take 1 tablet (500 mg total) by mouth daily. (Patient not taking: Reported on 07/03/2014)  . [DISCONTINUED] Pediatric Multivitamins-Iron (FLINTSTONES PLUS IRON) chewable tablet Chew 1 tablet by mouth 2 (two) times daily. (Patient not taking: Reported on 07/03/2014)     Objective: Blood pressure 120/72, pulse 66, temperature 97.7 F (36.5 C), temperature source Oral, resp. rate 16, height 5\' 5"  (1.651 m), weight 182 lb 8 oz (82.781 kg). Patient is alert and in no acute distress. Conjunctiva is somewhat pale.. Sclera is nonicteric Oropharyngeal mucosa is normal. No neck masses or thyromegaly noted. Cardiac exam with regular rhythm normal S1 and S2. No murmur or gallop noted. Lungs are clear to auscultation. Abdomen is full but soft and nontender  without organomegaly or masses. No LE edema or clubbing noted. He has no hair over his legs. Skin is tight but no ulcers noted.  Labs/studies Results: Lab data from 06/06/2014   WBC 6.0, H&H 8.6 and 30.6 and platelet count 244K Bilirubin was 0.8. AP 111, AST 26, ALT 13, total protein 6.8 and albumin 3.5.  Patient brought to Hemoccults and are both positive.  Assessment:  #1. Iron deficiency anemia secondary to chronic GI bleed from small bowel AV malformations. Hemoglobin is gradually dropping. If hemoglobin remains low he may need parenteral  iron. #2. Chronic lower extremity pain. He possibly has neuropathy but he could also have vascular insufficiency given that he is diabetic and still smoking. Further evaluation will be deferred to Dr. Delphina Cahill. #3. Hepatic fibrosis. Elastography in September last year revealed IQR/median velocity ratio of 2.3 which corresponds to matter rare fibrosis score between F3 and F4. However he has well-preserved hepatic function and liver fibrosis should not be a clinical issue for known. #4. Gastroesophageal reflux disease. Heartburn is well controlled with therapy.   Plan:  Patient to go to the lab for H&H serum iron TIBC and ferritin. Further recommendations to follow. Regarding patient's known GI issues I encouraged him to discuss with Dr. Delphina Cahill.

## 2014-07-04 NOTE — Telephone Encounter (Signed)
Per Dr.Rehman the patient will need to have labs drawn. 

## 2014-07-10 ENCOUNTER — Telehealth (INDEPENDENT_AMBULATORY_CARE_PROVIDER_SITE_OTHER): Payer: Self-pay | Admitting: *Deleted

## 2014-07-10 DIAGNOSIS — D508 Other iron deficiency anemias: Secondary | ICD-10-CM

## 2014-07-10 NOTE — Telephone Encounter (Signed)
Per Dr.Rehman the patient will need to have labs drawn in 1 month. 

## 2014-07-18 ENCOUNTER — Other Ambulatory Visit (INDEPENDENT_AMBULATORY_CARE_PROVIDER_SITE_OTHER): Payer: Self-pay | Admitting: *Deleted

## 2014-07-18 ENCOUNTER — Encounter (INDEPENDENT_AMBULATORY_CARE_PROVIDER_SITE_OTHER): Payer: Self-pay | Admitting: *Deleted

## 2014-07-18 DIAGNOSIS — D508 Other iron deficiency anemias: Secondary | ICD-10-CM

## 2014-08-06 LAB — HEMOGLOBIN AND HEMATOCRIT, BLOOD
HCT: 33.5 % — ABNORMAL LOW (ref 39.0–52.0)
HEMOGLOBIN: 10.1 g/dL — AB (ref 13.0–17.0)

## 2014-08-07 LAB — FERRITIN: Ferritin: 13 ng/mL — ABNORMAL LOW (ref 22–322)

## 2014-08-08 ENCOUNTER — Telehealth (INDEPENDENT_AMBULATORY_CARE_PROVIDER_SITE_OTHER): Payer: Self-pay | Admitting: *Deleted

## 2014-08-08 DIAGNOSIS — D509 Iron deficiency anemia, unspecified: Secondary | ICD-10-CM

## 2014-08-08 NOTE — Telephone Encounter (Signed)
Per Dr.Rehman the patient will need to have labs drawn in 3 months 

## 2014-09-26 ENCOUNTER — Encounter: Payer: Self-pay | Admitting: Cardiology

## 2014-09-26 ENCOUNTER — Ambulatory Visit (INDEPENDENT_AMBULATORY_CARE_PROVIDER_SITE_OTHER): Payer: Medicare HMO | Admitting: Cardiology

## 2014-09-26 VITALS — BP 124/74 | HR 55 | Ht 65.0 in | Wt 175.6 lb

## 2014-09-26 DIAGNOSIS — E782 Mixed hyperlipidemia: Secondary | ICD-10-CM | POA: Diagnosis not present

## 2014-09-26 DIAGNOSIS — I10 Essential (primary) hypertension: Secondary | ICD-10-CM | POA: Diagnosis not present

## 2014-09-26 DIAGNOSIS — I2510 Atherosclerotic heart disease of native coronary artery without angina pectoris: Secondary | ICD-10-CM | POA: Diagnosis not present

## 2014-09-26 DIAGNOSIS — I481 Persistent atrial fibrillation: Secondary | ICD-10-CM

## 2014-09-26 DIAGNOSIS — I4819 Other persistent atrial fibrillation: Secondary | ICD-10-CM

## 2014-09-26 NOTE — Patient Instructions (Signed)
Your physician wants you to follow-up in: 6 months with Dr.McDowell You will receive a reminder letter in the mail two months in advance. If you don't receive a letter, please call our office to schedule the follow-up appointment.     Your physician recommends that you continue on your current medications as directed. Please refer to the Current Medication list given to you today.     Thank you for choosing [facility] !        

## 2014-09-26 NOTE — Progress Notes (Signed)
Cardiology Office Note  Date: 09/26/2014   ID: Donald Gaines, DOB [birth year], MRN [id number]  PCP: Delphina Cahill, MD  Primary Cardiologist: Rozann Lesches, MD   Chief Complaint  Patient presents with  . Coronary Artery Disease  . Hypertension  . Hyperlipidemia    History of Present Illness: Donald Gaines is a medically complex 74 y.o. male last seen in December 2015. He presents for a routine cardiac visit today. Fortunately, he remains stable from a cardiac perspective, no angina symptoms or palpitations. He has chronic dyspnea on exertion at NYHA class 2-3. Functionally limited by neuropathy as well.  We reviewed his medications. Cardiac regimen is unchanged. Lipids are followed by Dr. Nevada Crane. He continues on Crestor.  I reviewed his ECG from March showing rate-controlled atrial fibrillation with nonspecific ST-T changes.  Most recent cardiac structural and ischemic testing is outlined below from last year.   Past Medical History  Diagnosis Date  . TIA (transient ischemic attack)     2005  . GERD (gastroesophageal reflux disease)   . Hyperlipidemia   . Essential hypertension, benign   . Depression   . Diabetes mellitus, type 2   . Restrictive lung disease   . Candida esophagitis   . Hemorrhoids   . Arteriovenous malformation small bowel     Capsule study 3/10  . GI bleeding     Cecal ulcers, arteriovenous malformations  . Chronic diarrhea SEP 2011    ?ETIOLOGY-DIABETIC ENTEROPATHY, LACTOSE INTOLERANCE,  OR IBS-MIXED  . Bloating AUG 2009    NL HBT FOR SIBO  . Anemia FEB 2008 FEDA 2o to AVMs, & large colon ulcers    HB 10.7 MCV 74.3; Dr Tressie Stalker  . BPH (benign prostatic hypertrophy)     Dr [place] Pink  . Glaucoma   . Cataract   . Bilateral carotid artery stenosis     50-75% external left carotid stenosis  . Coronary atherosclerosis of native coronary artery     Multivessel, BMS SVG TO RCA 2000, reportedly  "2" additional stents in interim  . Myocardial  infarction     1994  . COPD (chronic obstructive pulmonary disease)   . Sleep apnea   . Portal hypertensive gastropathy 11/08/2012    EGD. Dr. Laural Golden  . Dementia     Past Surgical History  Procedure Laterality Date  . Esophagogastroduodenoscopy  SEP 09    DUODENAL LIPOMA  . Capsule endo  03/10  . Right inguinal hernia repair    . Hydrogen breath test  2009  . Colonoscopy  APR 2008    SIMPLE ADENOMA, [place] [facility], IH  . Colonoscopy  FEB 2008 ANEMIA. [facility]     2 LARGE ILEOCEAL ULCERS 2o to ASA, [facility], IH  . Colonoscopy  SEP 2009 TRANSFUSION DEP ANEMIA    AC AVM-ABLATED, [facility], IH  . Upper gastrointestinal endoscopy  SEP 2009    GASTRIC AVM ABLATED, NL DUO Bx  . Cardiac catherization  04/2012  . Cardiac catheterization  04/05/1996 [facility]    EF 45%, occluded SVG to circumflex, patent SVG to D1 and PDA and patent LIMA to LAD with severe native vessel disease.  . Cardiac catheterization  08/22/1998 [facility]    Mild LM, severe LAD,severe CX, occlude RCA, patient  SVG to diatal RCA, patent vein graft to D1 and patent LIMA to LAD.   Marland Kitchen Coronary angioplasty with stent placement  08/22/1998 [name]    TEC stenting of the SVG to RCA-Last seen by  cardiologist in 2010.  Marland Kitchen Coronary artery bypass graft  1995-triple bypass    LIMA-LAD, LIMA to intermediate branch, SVG to PD and second OM,  . Cataract extraction w/phaco Right 09/22/2012    Procedure: CATARACT EXTRACTION PHACO AND INTRAOCULAR LENS PLACEMENT (IOC);  Surgeon: Tonny Branch, MD;  Location: [facility];  Service: Ophthalmology;  Laterality: Right;  CDE: 11.43  . Cataract extraction w/phaco Left 10/17/2012    Procedure: CATARACT EXTRACTION PHACO AND INTRAOCULAR LENS PLACEMENT (IOC);  Surgeon: Tonny Branch, MD;  Location: [facility];  Service: Ophthalmology;  Laterality: Left;  CDE: 8.06  . Colonoscopy with esophagogastroduodenoscopy (egd) N/A 11/08/2012    Procedure: COLONOSCOPY WITH ESOPHAGOGASTRODUODENOSCOPY (EGD);  Surgeon: Rogene Houston, MD;  Location: [facility];  Service: Endoscopy;  Laterality: N/A;  250-rescheduled to 7:30 Ann to notify pt  . Givens capsule study N/A 11/24/2012    Procedure: GIVENS CAPSULE STUDY;  Surgeon: Rogene Houston, MD;  Location: [facility];  Service: Endoscopy;  Laterality: N/A;  730  . Esophagogastroduodenoscopy N/A 09/27/2013    Procedure: ESOPHAGOGASTRODUODENOSCOPY (EGD) Push enteroscopy;  Surgeon: Rogene Houston, MD;  Location: [facility];  Service: Endoscopy;  Laterality: N/A;  . Hot hemostasis  09/27/2013    Procedure: HOT HEMOSTASIS (ARGON PLASMA COAGULATION/BICAP);  Surgeon: Rogene Houston, MD;  Location: [facility];  Service: Endoscopy;;    Current Outpatient Prescriptions  Medication Sig Dispense Refill  . ADVAIR DISKUS 250-50 MCG/DOSE AEPB Inhale 1 puff into the lungs 2 (two) times daily.     Marland Kitchen albuterol (PROAIR HFA) 108 (90 BASE) MCG/ACT inhaler Inhale 2 puffs into the lungs every 6 (six) hours as needed for wheezing or shortness of breath.    . ALPRAZolam (XANAX) 0.5 MG tablet Take 0.5 mg by mouth 2 (two) times daily as needed for anxiety.     . carvedilol (COREG) 12.5 MG tablet Take 12.5 mg by mouth 2 (two) times daily with a meal.     . cetirizine (ZYRTEC) 10 MG tablet Take 10 mg by mouth daily.    . clopidogrel (PLAVIX) 75 MG tablet Take 1 tablet (75 mg total) by mouth every morning.    . furosemide (LASIX) 40 MG tablet Take 40 mg by mouth 2 (two) times daily.     Marland Kitchen gabapentin (NEURONTIN) 100 MG capsule     . HYDROcodone-acetaminophen (NORCO/VICODIN) 5-325 MG per tablet Take 1 tablet by mouth every 6 (six) hours as needed. 15 tablet 0  . isosorbide mononitrate (IMDUR) 60 MG 24 hr tablet Take 1 tablet (60 mg total) by mouth daily. 30 tablet 11  . JANUMET 50-1000 MG per tablet Take 1 tablet by mouth 2 (two) times daily with a meal.     . losartan (COZAAR) 25 MG tablet Take 1 tablet (25 mg total) by mouth daily. 90 tablet 3  . NITROSTAT 0.4 MG SL tablet Place 0.4 mg  under the tongue every 5 (five) minutes as needed for chest pain.     Marland Kitchen omeprazole (PRILOSEC) 20 MG capsule Take 20 mg by mouth daily.     . rosuvastatin (CRESTOR) 20 MG tablet Take 1 tablet (20 mg total) by mouth daily. 90 tablet 0  . triamcinolone cream (KENALOG) 0.1 %      No current facility-administered medications for this visit.    Allergies:  Penicillins   Social History: The patient  reports that he has been smoking Cigarettes.  He has a 60 pack-year smoking history. He has never used  smokeless tobacco. He reports that he uses illicit drugs (Marijuana). He reports that he does not drink alcohol.   ROS:  Please see the history of present illness. Otherwise, complete review of systems is positive for chronic foot and leg weakness and paresthesias.  All other systems are reviewed and negative.   Physical Exam: VS:  BP 124/74 mmHg  Pulse 55  Ht 5\' 5"  (1.651 m)  Wt 175 lb 9.6 oz (79.652 kg)  BMI 29.22 kg/m2  SpO2 97%, BMI Body mass index is 29.22 kg/(m^2).  Wt Readings from Last 3 Encounters:  09/26/14 175 lb 9.6 oz (79.652 kg)  07/03/14 182 lb 8 oz (82.781 kg)  06/06/14 178 lb (80.74 kg)     No distress. HEENT: Conjunctiva and lids normal, oropharynx with poor dentition.  Neck: Supple, no elevated JVP or bruits, or thyromegaly.  Lungs: Clear to auscultation, nonlabored.  Cardiac: Irregularly irregular, indistinct PMI, soft basal systolic murmur, no S3 gallop.  Abdomen: Protuberant, obese, bowel sounds present, no tenderness.  Skin: Warm and dry, scattered tattoos noted.  Extremities: Trace ankle edema, distal pulses one plus.  Musculoskeletal: No kyphosis.  Neuropsychiatric: Alert and oriented x3, affect appropriate.   ECG: ECG is not ordered today.  Recent Labwork: 11/21/2013: Pro B Natriuretic peptide (BNP) 4018.0*; TSH 8.830* 06/06/2014: ALT 13; AST 26; B Natriuretic Peptide 575.0*; BUN 9; Creatinine, Ser 1.07; Platelets 244; Potassium 3.0*; Sodium  141 08/06/2014: Hemoglobin 10.1*   Other Studies Reviewed Today:  1. Lexiscan Cardiolite in September 2015 reported a large inferolateral scar with no clear evidence of ischemia, LVEF calculated at 39%,   2. Echocardiogram in September 2015 reported mild LVH with LVEF 50-55%, increased LVEDP with indeterminate diastolic function, septal dyssynergy, mild to moderate mitral regurgitation, severe left atrial enlargement, mildly reduced RV contraction, moderate tricuspid regurgitation with PASD 63 mmHg, small pericardial effusion.  ASSESSMENT AND PLAN:  1. Multivessel CAD as outlined above, ischemic workup from within the last year showed evidence of large inferolateral scar without active ischemic burden. LVEF 50-55% by echocardiogram. We continue medical therapy and observation.  2. Chronic atrial fibrillation, continue strategy of heart rate control. As noted previously, poor candidate for anticoagulation with history of recurrent GI bleeding and AVMs.  3. Hyperlipidemia, on Crestor, followed by Dr. Nevada Crane.  4. Essential hypertension, blood pressure normal today.  Current medicines were reviewed at length with the patient today.   Disposition: FU with me in 6 months.   Signed, Satira Sark, MD, [facility] 09/26/2014 11:35 AM    [facility]. [address], [place], [place] [address] Phone: [phone]; Fax: [phone]

## 2014-10-17 ENCOUNTER — Encounter (INDEPENDENT_AMBULATORY_CARE_PROVIDER_SITE_OTHER): Payer: Self-pay | Admitting: *Deleted

## 2014-10-17 ENCOUNTER — Other Ambulatory Visit (INDEPENDENT_AMBULATORY_CARE_PROVIDER_SITE_OTHER): Payer: Self-pay | Admitting: *Deleted

## 2014-10-17 DIAGNOSIS — D509 Iron deficiency anemia, unspecified: Secondary | ICD-10-CM

## 2014-10-19 LAB — HEMOGLOBIN AND HEMATOCRIT, BLOOD
HCT: 36.3 % — ABNORMAL LOW (ref 39.0–52.0)
Hemoglobin: 11.5 g/dL — ABNORMAL LOW (ref 13.0–17.0)

## 2014-10-19 LAB — FERRITIN: FERRITIN: 15 ng/mL — AB (ref 22–322)

## 2014-10-22 ENCOUNTER — Telehealth (INDEPENDENT_AMBULATORY_CARE_PROVIDER_SITE_OTHER): Payer: Self-pay | Admitting: *Deleted

## 2014-10-22 ENCOUNTER — Ambulatory Visit (INDEPENDENT_AMBULATORY_CARE_PROVIDER_SITE_OTHER): Payer: Medicare HMO | Admitting: Internal Medicine

## 2014-10-22 ENCOUNTER — Encounter (INDEPENDENT_AMBULATORY_CARE_PROVIDER_SITE_OTHER): Payer: Self-pay | Admitting: Internal Medicine

## 2014-10-22 VITALS — BP 112/70 | HR 68 | Temp 97.8°F | Resp 18 | Ht 65.0 in | Wt 178.9 lb

## 2014-10-22 DIAGNOSIS — D509 Iron deficiency anemia, unspecified: Secondary | ICD-10-CM

## 2014-10-22 DIAGNOSIS — K219 Gastro-esophageal reflux disease without esophagitis: Secondary | ICD-10-CM | POA: Diagnosis not present

## 2014-10-22 MED ORDER — IRON 240 (27 FE) MG PO TABS: 27.0000 mg | ORAL_TABLET | Freq: Two times a day (BID) | ORAL | Status: DC

## 2014-10-22 NOTE — Progress Notes (Signed)
Presenting complaint;  Follow-up for iron deficiency anemia and GERD.  Subjective:  Man is 74 year old Caucasian male who is here for scheduled visit. He was last seen on 07/03/2014. He denies melena or rectal bleeding. At times his stool is dark. He says lately she's taken one iron pill a day. It has 27 mg of elemental iron. He could not tolerate higher dose. History this heartburns well controlled with PPI. He says this morning he woke up with lower abdominal pain and had 3 bowel movements. He did not experience fever nausea vomiting melena or rectal bleeding. Pain has resolved. He complains of painful swelling of left knee. He's had surgery on the skin before. He does not feel as weak as he did in the past.     Current Medications: Outpatient Encounter Prescriptions as of 10/22/2014  Medication Sig  . ADVAIR DISKUS 250-50 MCG/DOSE AEPB Inhale 1 puff into the lungs 2 (two) times daily.   Marland Kitchen albuterol (PROAIR HFA) 108 (90 BASE) MCG/ACT inhaler Inhale 2 puffs into the lungs every 6 (six) hours as needed for wheezing or shortness of breath.  . ALPRAZolam (XANAX) 0.5 MG tablet Take 0.5 mg by mouth 2 (two) times daily as needed for anxiety.   . carvedilol (COREG) 12.5 MG tablet Take 12.5 mg by mouth 2 (two) times daily with a meal.   . cetirizine (ZYRTEC) 10 MG tablet Take 10 mg by mouth daily.  . clopidogrel (PLAVIX) 75 MG tablet Take 1 tablet (75 mg total) by mouth every morning.  . furosemide (LASIX) 40 MG tablet Take 40 mg by mouth 2 (two) times daily.   Marland Kitchen gabapentin (NEURONTIN) 100 MG capsule Take 100 mg by mouth at bedtime.   Marland Kitchen HYDROcodone-acetaminophen (NORCO/VICODIN) 5-325 MG per tablet Take 1 tablet by mouth every 6 (six) hours as needed.  . IRON PO Take 27 mg by mouth daily.  . isosorbide mononitrate (IMDUR) 60 MG 24 hr tablet Take 1 tablet (60 mg total) by mouth daily.  Marland Kitchen JANUMET 50-1000 MG per tablet Take 1 tablet by mouth 2 (two) times daily with a meal.   . lidocaine-prilocaine  (EMLA) cream Apply 1 application topically once.   Marland Kitchen losartan (COZAAR) 25 MG tablet Take 1 tablet (25 mg total) by mouth daily.  Marland Kitchen NITROSTAT 0.4 MG SL tablet Place 0.4 mg under the tongue every 5 (five) minutes as needed for chest pain.   Marland Kitchen omeprazole (PRILOSEC) 20 MG capsule Take 20 mg by mouth daily.   . rosuvastatin (CRESTOR) 20 MG tablet Take 1 tablet (20 mg total) by mouth daily.  Marland Kitchen triamcinolone cream (KENALOG) 0.1 %    No facility-administered encounter medications on file as of 10/22/2014.     Objective: Blood pressure 112/70, pulse 68, temperature 97.8 F (36.6 C), temperature source Oral, resp. rate 18, height 5\' 5"  (1.651 m), weight 178 lb 14.4 oz (81.149 kg). Patient is alert and in no acute distress. Conjunctiva is pink. Sclera is nonicteric Oropharyngeal mucosa is normal. No neck masses or thyromegaly noted. Cardiac exam with irregular rhythm normal S1 and S2. No murmur or gallop noted. Lungs are clear to auscultation. Abdomen is full but soft and nontender without organomegaly or masses. No LE edema or clubbing noted.  Labs/studies Results: Lab data from 10/17/2014  H&H 11.5 and 36.3  Serum ferritin 15   H&H was 10.1 and 33.5 two months ago   H&H was 8.6 and 30.6 four months ago.  Assessment:  #1. History of iron deficiency anemia secondary  to chronic GI blood loss from small bowel angiodysplasia in the setting of chronic antiplatelet therapy with clopidogrel. Continued rising H&H is reassuring. #2. GERD. Heartburn is well controlled with therapy. #3. Left knee pain and effusion possibly secondary to osteoarthrosis. Patient will follow-up with Dr. Nevada Crane   Plan:  Patient is aware not to take OTC NSAIDs. Patient advised to take iron pill twice daily. H&H and serum ferritin in 3 months. Office visit in 6 months.

## 2014-10-22 NOTE — Telephone Encounter (Signed)
Per Dr.Rehman the patient will need to have labs drawn in 3 months 

## 2014-10-22 NOTE — Patient Instructions (Addendum)
Take Plavix or clopidogrel every evening since you take omeprazole every morning. Notify if he have rectal bleeding. Next blood work in 3 months. Office will remind you.

## 2014-12-25 ENCOUNTER — Encounter (HOSPITAL_COMMUNITY): Payer: Self-pay | Admitting: Emergency Medicine

## 2014-12-25 ENCOUNTER — Emergency Department (HOSPITAL_COMMUNITY): Payer: Medicare HMO

## 2014-12-25 ENCOUNTER — Observation Stay (HOSPITAL_COMMUNITY)
Admission: EM | Admit: 2014-12-25 | Discharge: 2014-12-26 | Disposition: A | Discharge status: Home / Self Care | Payer: Medicare HMO | Attending: Internal Medicine | Admitting: Internal Medicine

## 2014-12-25 DIAGNOSIS — E785 Hyperlipidemia, unspecified: Secondary | ICD-10-CM | POA: Diagnosis not present

## 2014-12-25 DIAGNOSIS — R0602 Shortness of breath: Secondary | ICD-10-CM | POA: Insufficient documentation

## 2014-12-25 DIAGNOSIS — Z720 Tobacco use: Secondary | ICD-10-CM | POA: Diagnosis not present

## 2014-12-25 DIAGNOSIS — D649 Anemia, unspecified: Secondary | ICD-10-CM | POA: Insufficient documentation

## 2014-12-25 DIAGNOSIS — Z79899 Other long term (current) drug therapy: Secondary | ICD-10-CM | POA: Diagnosis not present

## 2014-12-25 DIAGNOSIS — W1839XA Other fall on same level, initial encounter: Secondary | ICD-10-CM | POA: Insufficient documentation

## 2014-12-25 DIAGNOSIS — Z7902 Long term (current) use of antithrombotics/antiplatelets: Secondary | ICD-10-CM | POA: Insufficient documentation

## 2014-12-25 DIAGNOSIS — S6991XA Unspecified injury of right wrist, hand and finger(s), initial encounter: Secondary | ICD-10-CM | POA: Diagnosis not present

## 2014-12-25 DIAGNOSIS — Z8619 Personal history of other infectious and parasitic diseases: Secondary | ICD-10-CM | POA: Diagnosis not present

## 2014-12-25 DIAGNOSIS — Z880 Allergy status to penicillin: Secondary | ICD-10-CM | POA: Insufficient documentation

## 2014-12-25 DIAGNOSIS — E876 Hypokalemia: Principal | ICD-10-CM | POA: Insufficient documentation

## 2014-12-25 DIAGNOSIS — R51 Headache: Secondary | ICD-10-CM | POA: Diagnosis not present

## 2014-12-25 DIAGNOSIS — I2510 Atherosclerotic heart disease of native coronary artery without angina pectoris: Secondary | ICD-10-CM | POA: Insufficient documentation

## 2014-12-25 DIAGNOSIS — S299XXA Unspecified injury of thorax, initial encounter: Secondary | ICD-10-CM | POA: Diagnosis not present

## 2014-12-25 DIAGNOSIS — N400 Enlarged prostate without lower urinary tract symptoms: Secondary | ICD-10-CM | POA: Insufficient documentation

## 2014-12-25 DIAGNOSIS — I6523 Occlusion and stenosis of bilateral carotid arteries: Secondary | ICD-10-CM | POA: Insufficient documentation

## 2014-12-25 DIAGNOSIS — E119 Type 2 diabetes mellitus without complications: Secondary | ICD-10-CM | POA: Diagnosis not present

## 2014-12-25 DIAGNOSIS — S3992XA Unspecified injury of lower back, initial encounter: Secondary | ICD-10-CM | POA: Diagnosis not present

## 2014-12-25 DIAGNOSIS — W19XXXA Unspecified fall, initial encounter: Secondary | ICD-10-CM

## 2014-12-25 DIAGNOSIS — R5383 Other fatigue: Secondary | ICD-10-CM | POA: Diagnosis present

## 2014-12-25 DIAGNOSIS — Y998 Other external cause status: Secondary | ICD-10-CM | POA: Diagnosis not present

## 2014-12-25 DIAGNOSIS — Y9389 Activity, other specified: Secondary | ICD-10-CM | POA: Insufficient documentation

## 2014-12-25 DIAGNOSIS — Y9289 Other specified places as the place of occurrence of the external cause: Secondary | ICD-10-CM | POA: Diagnosis not present

## 2014-12-25 DIAGNOSIS — S0990XA Unspecified injury of head, initial encounter: Secondary | ICD-10-CM | POA: Insufficient documentation

## 2014-12-25 DIAGNOSIS — K219 Gastro-esophageal reflux disease without esophagitis: Secondary | ICD-10-CM | POA: Insufficient documentation

## 2014-12-25 DIAGNOSIS — I10 Essential (primary) hypertension: Secondary | ICD-10-CM | POA: Insufficient documentation

## 2014-12-25 DIAGNOSIS — S79911A Unspecified injury of right hip, initial encounter: Secondary | ICD-10-CM | POA: Insufficient documentation

## 2014-12-25 DIAGNOSIS — S199XXA Unspecified injury of neck, initial encounter: Secondary | ICD-10-CM | POA: Diagnosis not present

## 2014-12-25 DIAGNOSIS — R404 Transient alteration of awareness: Secondary | ICD-10-CM | POA: Diagnosis not present

## 2014-12-25 DIAGNOSIS — S3993XA Unspecified injury of pelvis, initial encounter: Secondary | ICD-10-CM | POA: Diagnosis not present

## 2014-12-25 DIAGNOSIS — R531 Weakness: Secondary | ICD-10-CM | POA: Diagnosis not present

## 2014-12-25 LAB — CBC WITH DIFFERENTIAL/PLATELET
BASOS ABS: 0 10*3/uL (ref 0.0–0.1)
BASOS PCT: 0 %
EOS ABS: 0.1 10*3/uL (ref 0.0–0.7)
EOS PCT: 1 %
HCT: 35.9 % — ABNORMAL LOW (ref 39.0–52.0)
Hemoglobin: 11.8 g/dL — ABNORMAL LOW (ref 13.0–17.0)
LYMPHS ABS: 1.2 10*3/uL (ref 0.7–4.0)
Lymphocytes Relative: 12 %
MCH: 30.3 pg (ref 26.0–34.0)
MCHC: 32.9 g/dL (ref 30.0–36.0)
MCV: 92.1 fL (ref 78.0–100.0)
Monocytes Absolute: 0.9 10*3/uL (ref 0.1–1.0)
Monocytes Relative: 9 %
Neutro Abs: 8.1 10*3/uL — ABNORMAL HIGH (ref 1.7–7.7)
Neutrophils Relative %: 78 %
PLATELETS: 225 10*3/uL (ref 150–400)
RBC: 3.9 MIL/uL — AB (ref 4.22–5.81)
RDW: 15.8 % — ABNORMAL HIGH (ref 11.5–15.5)
WBC: 10.4 10*3/uL (ref 4.0–10.5)

## 2014-12-25 LAB — COMPREHENSIVE METABOLIC PANEL
ALBUMIN: 3.9 g/dL (ref 3.5–5.0)
ALT: 22 U/L (ref 17–63)
AST: 28 U/L (ref 15–41)
Alkaline Phosphatase: 83 U/L (ref 38–126)
Anion gap: 8 (ref 5–15)
BUN: 16 mg/dL (ref 6–20)
CHLORIDE: 103 mmol/L (ref 101–111)
CO2: 32 mmol/L (ref 22–32)
CREATININE: 1.49 mg/dL — AB (ref 0.61–1.24)
Calcium: 9 mg/dL (ref 8.9–10.3)
GFR calc non Af Amer: 45 mL/min — ABNORMAL LOW (ref >60)
GFR, EST AFRICAN AMERICAN: 53 mL/min — AB (ref >60)
Glucose, Bld: 172 mg/dL — ABNORMAL HIGH (ref 65–99)
Potassium: 2.3 mmol/L — CL (ref 3.5–5.1)
SODIUM: 143 mmol/L (ref 135–145)
Total Bilirubin: 0.9 mg/dL (ref 0.3–1.2)
Total Protein: 6.8 g/dL (ref 6.5–8.1)

## 2014-12-25 LAB — URINALYSIS, ROUTINE W REFLEX MICROSCOPIC
Bilirubin Urine: NEGATIVE
Glucose, UA: NEGATIVE mg/dL
Ketones, ur: NEGATIVE mg/dL
Leukocytes, UA: NEGATIVE
NITRITE: NEGATIVE
SPECIFIC GRAVITY, URINE: 1.01 (ref 1.005–1.030)
UROBILINOGEN UA: 1 mg/dL (ref 0.0–1.0)
pH: 6.5 (ref 5.0–8.0)

## 2014-12-25 LAB — URINE MICROSCOPIC-ADD ON

## 2014-12-25 LAB — MAGNESIUM: MAGNESIUM: 1.9 mg/dL (ref 1.7–2.4)

## 2014-12-25 MED ORDER — POTASSIUM CHLORIDE 10 MEQ/100ML IV SOLN
10.0000 meq | INTRAVENOUS | Status: DC
  Administered 2014-12-25 – 2014-12-26 (×2): 10 meq via INTRAVENOUS
  Filled 2014-12-25 (×3): qty 100

## 2014-12-25 MED ORDER — MAGNESIUM SULFATE 2 GM/50ML IV SOLN
2.0000 g | Freq: Once | INTRAVENOUS | Status: AC
  Administered 2014-12-25: 2 g via INTRAVENOUS
  Filled 2014-12-25: qty 50

## 2014-12-25 MED ORDER — HYDROCODONE-ACETAMINOPHEN 5-325 MG PO TABS
1.0000 | ORAL_TABLET | Freq: Once | ORAL | Status: AC
  Administered 2014-12-25: 1 via ORAL
  Filled 2014-12-25: qty 1

## 2014-12-25 MED ORDER — NICOTINE 21 MG/24HR TD PT24
21.0000 mg | MEDICATED_PATCH | Freq: Every day | TRANSDERMAL | Status: DC
  Administered 2014-12-25: 21 mg via TRANSDERMAL
  Filled 2014-12-25: qty 1

## 2014-12-25 MED ORDER — DIAZEPAM 2 MG PO TABS
2.0000 mg | ORAL_TABLET | Freq: Once | ORAL | Status: AC
  Administered 2014-12-25: 2 mg via ORAL
  Filled 2014-12-25: qty 1

## 2014-12-25 MED ORDER — POTASSIUM CHLORIDE CRYS ER 20 MEQ PO TBCR
40.0000 meq | EXTENDED_RELEASE_TABLET | Freq: Once | ORAL | Status: AC
  Administered 2014-12-25: 40 meq via ORAL
  Filled 2014-12-25: qty 2

## 2014-12-25 NOTE — ED Notes (Signed)
Pt c/o multiple falls over the last couple of days and c/o generalized weakness, sob, and back pain.

## 2014-12-25 NOTE — ED Notes (Signed)
CRITICAL VALUE ALERT  Critical value received:  K 2.3  Date of notification:  12/25/14  Time of notification:  2248  Critical value read back:Yes.    Nurse who received alert:  B. Olena Heckle, RN  MD notified (1st page):  [place]  Time of first page:  2250  MD notified (2nd page):  Time of second page:  Responding MD:  Mesner  Time MD responded:  2250

## 2014-12-25 NOTE — ED Provider Notes (Signed)
CSN: [id number]     Arrival date & time 12/25/14  2133 History  By signing my name below, I, Meriel Pica, attest that this documentation has been prepared under the direction and in the presence of Merrily Pew, MD. Electronically Signed: Meriel Pica, ED Scribe. 12/25/2014. 10:59 PM.   Chief Complaint  Patient presents with  . Fatigue   Patient is a 74 y.o. male presenting with fall. The history is provided by the patient. No language interpreter was used.  Fall This is a new problem. The current episode started 1 to 2 hours ago. The problem occurs constantly. The problem has not changed since onset.Associated symptoms include headaches ( right temporal region) and shortness of breath. Pertinent negatives include no chest pain and no abdominal pain.   HPI Comments: Donald Gaines is a 74 y.o. male, with a significant PMhx, who presents to the Emergency Department complaining of sudden onset generalized back pain, right hip pain, right wrist pain, and a right-sided headache onset PTA s/p fall that occurred PTA. The pt reports he was working in a building today when he became 'swimmy headed' at which point he took a break and smoked a cigarette. He notes after smoking he tried to walk up his porch stairs when he became SOB and weak and fell, landing on his back and right hip. Pain is worse with movement. He states he recently finished a course of prednisone. Endorses pain in right wrist at baseline. PMhx of COPD; pt is non compliant with home O2. He is a current, daily smoker. No fevers. No wound attributable to fall.   Past Medical History  Diagnosis Date  . TIA (transient ischemic attack)     2005  . GERD (gastroesophageal reflux disease)   . Hyperlipidemia   . Essential hypertension, benign   . Depression   . Diabetes mellitus, type 2 ([place])   . Restrictive lung disease   . Candida esophagitis ([place])   . Hemorrhoids   . Arteriovenous malformation small bowel     Capsule study 3/10   . GI bleeding     Cecal ulcers, arteriovenous malformations  . Chronic diarrhea SEP 2011    ?ETIOLOGY-DIABETIC ENTEROPATHY, LACTOSE INTOLERANCE,  OR IBS-MIXED  . Bloating AUG 2009    NL HBT FOR SIBO  . Anemia FEB 2008 FEDA 2o to AVMs, & large colon ulcers    HB 10.7 MCV 74.3; Dr Tressie Stalker  . BPH (benign prostatic hypertrophy)     Dr [place] Pink  . Glaucoma   . Cataract   . Bilateral carotid artery stenosis     50-75% external left carotid stenosis  . Coronary atherosclerosis of native coronary artery     Multivessel, BMS SVG TO RCA 2000, reportedly  "2" additional stents in interim  . Myocardial infarction ([place])     1994  . COPD (chronic obstructive pulmonary disease) ([place])   . Sleep apnea   . Portal hypertensive gastropathy 11/08/2012    EGD. Dr. Laural Golden  . Dementia    Past Surgical History  Procedure Laterality Date  . Esophagogastroduodenoscopy  SEP 09    DUODENAL LIPOMA  . Capsule endo  03/10  . Right inguinal hernia repair    . Hydrogen breath test  2009  . Colonoscopy  APR 2008    SIMPLE ADENOMA, [place] TICS, IH  . Colonoscopy  FEB 2008 ANEMIA. MELENA     2 LARGE ILEOCEAL ULCERS 2o to ASA, [place] TICS, IH  . Colonoscopy  SEP 2009 TRANSFUSION DEP  ANEMIA    AC AVM-ABLATED, [place] TICS, IH  . Upper gastrointestinal endoscopy  SEP 2009    GASTRIC AVM ABLATED, NL DUO Bx  . Cardiac catherization  04/2012  . Cardiac catheter[facility]  [facility]    EF 45%, occluded SVG to circumflex, patent SVG to D1 and PDA and patent LIMA to LAD with severe native vessel disease.  . Cardiac catheterization  08/22/1998 [facility]    Mild LM, severe LAD,severe CX, occlude RCA, patient  SVG to diatal RCA, patent vein graft to D1 and patent LIMA to LAD.   Marland Kitchen Coronary angioplasty with stent placement  08/22/1998 [name]    TEC stenting of the SVG to RCA-Last seen by cardiologist in 2010.  Marland Kitchen Coronary artery bypass graft  1995-triple bypass    LIMA-LAD, LIMA to intermediate branch, SVG to PD  and second OM,  . Cataract extraction w/phaco Right 09/22/2012    Procedure: CATARACT EXTRACTION PHACO AND INTRAOCULAR LENS PLACEMENT (IOC);  Surgeon: Tonny Branch, MD;  Location: [facility];  Service: Ophthalmology;  Laterality: Right;  CDE: 11.43  . Cataract extraction w/phaco Left 10/17/2012    Procedure: CATARACT EXTRACTION PHACO AND INTRAOCULAR LENS PLACEMENT (IOC);  Surgeon: Tonny Branch, MD;  Location: [facility];  Service: Ophthalmology;  Laterality: Left;  CDE: 8.06  . Colonoscopy with esophagogastroduodenoscopy (egd) N/A 11/08/2012    Procedure: COLONOSCOPY WITH ESOPHAGOGASTRODUODENOSCOPY (EGD);  Surgeon: Rogene Houston, MD;  Location: [facility];  Service: Endoscopy;  Laterality: N/A;  250-rescheduled to 7:30 Ann to notify pt  . [place] capsule study N/A 11/24/2012    Procedure: GIVENS CAPSULE STUDY;  Surgeon: Rogene Houston, MD;  Location: [facility];  Service: Endoscopy;  Laterality: N/A;  730  . Esophagogastroduodenoscopy N/A 09/27/2013    Procedure: ESOPHAGOGASTRODUODENOSCOPY (EGD) Push enteroscopy;  Surgeon: Rogene Houston, MD;  Location: [facility];  Service: Endoscopy;  Laterality: N/A;  . Hot hemostasis  09/27/2013    Procedure: HOT HEMOSTASIS (ARGON PLASMA COAGULATION/BICAP);  Surgeon: Rogene Houston, MD;  Location: [facility];  Service: Endoscopy;;   Family History  Problem Relation Age of Onset  . Heart attack Mother   . Heart disease Mother   . Hypertension Mother    Social History  Substance Use Topics  . Smoking status: Current Every Day Smoker -- 1.00 packs/day for 60 years    Types: Cigarettes  . Smokeless tobacco: Never Used     Comment: 1 pack a day  . Alcohol Use: No     Comment: No etoh in 12 yrs.    Review of Systems  Constitutional: Negative for fever.  Respiratory: Positive for shortness of breath.   Cardiovascular: Negative for chest pain.  Gastrointestinal: Negative for abdominal pain.  Musculoskeletal: Positive for back pain and arthralgias ( right  hip, right wrist).  Skin: Negative for wound.  Neurological: Positive for weakness ( generalized ) and headaches ( right temporal region).  All other systems reviewed and are negative.  Allergies  Penicillins  Home Medications   Prior to Admission medications   Medication Sig Start Date End Date Taking? Authorizing Provider  ADVAIR DISKUS 250-50 MCG/DOSE AEPB Inhale 1 puff into the lungs 2 (two) times daily.  10/02/10  Yes Historical Provider, MD  albuterol (PROAIR HFA) 108 (90 BASE) MCG/ACT inhaler Inhale 2 puffs into the lungs every 6 (six) hours as needed for wheezing or shortness of breath.   Yes Historical Provider, MD  ALPRAZolam Duanne Moron) 0.5 MG tablet Take  0.5 mg by mouth 2 (two) times daily as needed for anxiety.    Yes Historical Provider, MD  clopidogrel (PLAVIX) 75 MG tablet Take 1 tablet (75 mg total) by mouth every morning. 09/27/13  Yes Nita Sells, MD  NITROSTAT 0.4 MG SL tablet Place 0.4 mg under the tongue every 5 (five) minutes as needed for chest pain.  05/23/10  Yes Historical Provider, MD  carvedilol (COREG) 12.5 MG tablet Take 12.5 mg by mouth 2 (two) times daily with a meal.  03/26/14   Historical Provider, MD  cetirizine (ZYRTEC) 10 MG tablet Take 10 mg by mouth daily.    Historical Provider, MD  Ferrous Gluconate (IRON) 240 (27 FE) MG TABS Take 27 mg by mouth 2 (two) times daily. 10/22/14   Rogene Houston, MD  furosemide (LASIX) 40 MG tablet Take 40 mg by mouth 2 (two) times daily.     Historical Provider, MD  gabapentin (NEURONTIN) 100 MG capsule Take 100 mg by mouth at bedtime.  09/20/14   Historical Provider, MD  HYDROcodone-acetaminophen (NORCO/VICODIN) 5-325 MG per tablet Take 1 tablet by mouth every 6 (six) hours as needed. 06/06/14   Milton Whitwell, MD  isosorbide mononitrate (IMDUR) 60 MG 24 hr tablet Take 1 tablet (60 mg total) by mouth daily. 06/01/13   Lendon Colonel, NP  JANUMET 50-1000 MG per tablet Take 1 tablet by mouth 2 (two) times daily with a meal.   07/22/13   Historical Provider, MD  lidocaine-prilocaine (EMLA) cream Apply 1 application topically once.  09/25/14   Historical Provider, MD  losartan (COZAAR) 25 MG tablet Take 1 tablet (25 mg total) by mouth daily. 12/04/13   Satira Sark, MD  omeprazole (PRILOSEC) 20 MG capsule Take 20 mg by mouth daily.  08/28/13   Historical Provider, MD  rosuvastatin (CRESTOR) 20 MG tablet Take 1 tablet (20 mg total) by mouth daily. 12/12/13   Satira Sark, MD  triamcinolone cream (KENALOG) 0.1 % Apply 1 application topically 2 (two) times daily.  09/11/14   Historical Provider, MD   BP 135/58 mmHg  Pulse 92  Temp(Src) 98.2 F (36.8 C)  Resp 18  Ht 5\' 5"  (1.651 m)  Wt 178 lb (80.74 kg)  BMI 29.62 kg/m2  SpO2 94% Physical Exam  Constitutional: He is oriented to person, place, and time. He appears well-developed and well-nourished.  HENT:  Head: Normocephalic and atraumatic.  Eyes: EOM are normal. Pupils are equal, round, and reactive to light.  Neck: Neck supple.  No C-spine tenderness.   Cardiovascular: Normal rate and regular rhythm.   Pulmonary/Chest: No respiratory distress. He has no wheezes. He exhibits no tenderness.  Abdominal: Soft. He exhibits no distension. There is no tenderness.  Musculoskeletal: Normal range of motion. He exhibits tenderness. He exhibits no edema.  Tenderness in lumbar spine, no C or T spine tenderness; moves all extremities; good sensation; no obvious laceration or hematomas to head.  Neurological: He is alert and oriented to person, place, and time. No cranial nerve deficit.  Skin: Skin is warm and dry.  Psychiatric: He has a normal mood and affect.  Nursing note and vitals reviewed.   ED Course  Procedures  DIAGNOSTIC STUDIES: Oxygen Saturation is 94% on RA, adequate by my interpretation.    COORDINATION OF CARE: 10:05 PM Discussed treatment plan with pt at bedside and pt agreed to plan.   Labs Review Labs Reviewed  CBC WITH DIFFERENTIAL/PLATELET -  Abnormal; Notable for the following:  RBC 3.90 (*)    Hemoglobin 11.8 (*)    HCT 35.9 (*)    RDW 15.8 (*)    Neutro Abs 8.1 (*)    All other components within normal limits  COMPREHENSIVE METABOLIC PANEL - Abnormal; Notable for the following:    Potassium 2.3 (*)    Glucose, Bld 172 (*)    Creatinine, Ser 1.49 (*)    GFR calc non Af Amer 45 (*)    GFR calc Af Amer 53 (*)    All other components within normal limits  URINALYSIS, ROUTINE W REFLEX MICROSCOPIC (NOT AT [facility]) - Abnormal; Notable for the following:    Hgb urine dipstick SMALL (*)    Protein, ur TRACE (*)    All other components within normal limits  URINE MICROSCOPIC-ADD ON  MAGNESIUM    Imaging Review Dg Chest 2 View  12/25/2014   CLINICAL DATA:  [place] backwards on steps  EXAM: CHEST  2 VIEW  COMPARISON:  06/06/2014  FINDINGS: There is no pneumothorax or effusion. Mediastinal contours are unremarkable and unchanged. There is unchanged moderate cardiomegaly. The lungs are clear except for chronic interstitial coarsening. No displaced fractures are evident.  IMPRESSION: Chronic interstitial coarsening.  Cardiomegaly.  No acute findings.   Electronically Signed   By: Andreas Newport M.D.   On: 12/25/2014 23:19   Dg Thoracic Spine W/swimmers  12/25/2014   CLINICAL DATA:  [place] backwards on steps  EXAM: THORACIC SPINE - 3 VIEWS  COMPARISON:  06/06/2014  FINDINGS: There is no evidence of thoracic spine fracture. Alignment is normal. No other significant bone abnormalities are identified.  IMPRESSION: Negative.   Electronically Signed   By: Andreas Newport M.D.   On: 12/25/2014 23:15   Dg Lumbar Spine Complete  12/25/2014   CLINICAL DATA:  [place] backwards on steps  EXAM: LUMBAR SPINE - COMPLETE 4+ VIEW  COMPARISON:  07/13/2013  FINDINGS: There is no evidence of lumbar spine fracture. Alignment is normal. Intervertebral disc spaces are maintained.  IMPRESSION: Negative.   Electronically Signed   By: Andreas Newport M.D.   On:  12/25/2014 23:17   Dg Pelvis 1-2 Views  12/25/2014   CLINICAL DATA:  [place] backwards on steps  EXAM: PELVIS - 1-2 VIEW  COMPARISON:  None.  FINDINGS: Negative for acute fracture. Moderate arthritic changes are present about the left hip. Pubic symphysis and sacroiliac joints appear grossly intact.  IMPRESSION: Negative for acute fracture   Electronically Signed   By: Andreas Newport M.D.   On: 12/25/2014 23:20   Ct Head Wo Contrast  12/25/2014   CLINICAL DATA:  Sudden onset of right-sided headache after fall.  EXAM: CT HEAD WITHOUT CONTRAST  CT CERVICAL SPINE WITHOUT CONTRAST  TECHNIQUE: Multidetector CT imaging of the head and cervical spine was performed following the standard protocol without intravenous contrast. Multiplanar CT image reconstructions of the cervical spine were also generated.  COMPARISON:  11/21/2013  FINDINGS: CT HEAD FINDINGS  Low density in the periventricular white matter and external capsules are compatible with the chronic ischemic changes of small vessel disease. No mass effect, midline shift, or acute hemorrhage. The left maxillary sinus is opacified. Mastoid air cells are clear. No skull fractures.  CT CERVICAL SPINE FINDINGS  No fracture. No dislocation. Degenerative changes are seen throughout the cervical spine. There is spinal stenosis at C3-4 secondary to congenital spinal narrowing and posterior disc osteophytes. This occurs to a lesser degree at other levels. No obvious soft tissue injury.  IMPRESSION: No acute intracranial pathology. No evidence of cervical spine injury. Degenerative changes and spinal stenosis are noted.   Electronically Signed   By: Marybelle Killings M.D.   On: 12/25/2014 23:20   Ct Cervical Spine Wo Contrast  12/25/2014   CLINICAL DATA:  Sudden onset of right-sided headache after fall.  EXAM: CT HEAD WITHOUT CONTRAST  CT CERVICAL SPINE WITHOUT CONTRAST  TECHNIQUE: Multidetector CT imaging of the head and cervical spine was performed following the standard  protocol without intravenous contrast. Multiplanar CT image reconstructions of the cervical spine were also generated.  COMPARISON:  11/21/2013  FINDINGS: CT HEAD FINDINGS  Low density in the periventricular white matter and external capsules are compatible with the chronic ischemic changes of small vessel disease. No mass effect, midline shift, or acute hemorrhage. The left maxillary sinus is opacified. Mastoid air cells are clear. No skull fractures.  CT CERVICAL SPINE FINDINGS  No fracture. No dislocation. Degenerative changes are seen throughout the cervical spine. There is spinal stenosis at C3-4 secondary to congenital spinal narrowing and posterior disc osteophytes. This occurs to a lesser degree at other levels. No obvious soft tissue injury.  IMPRESSION: No acute intracranial pathology. No evidence of cervical spine injury. Degenerative changes and spinal stenosis are noted.   Electronically Signed   By: Marybelle Killings M.D.   On: 12/25/2014 23:20   I have personally reviewed and evaluated these images and lab results as part of my medical decision-making.   EKG Interpretation   Date/Time:  Tuesday December 25 2014 21:38:48 EDT Ventricular Rate:  80 PR Interval:    QRS Duration: 104 QT Interval:  390 QTC Calculation: 450 R Axis:   40 Text Interpretation:  Atrial fibrillation Probable anteroseptal infarct,  old Repol abnrm suggests ischemia, anterolateral Baseline wander in  lead(s) V1 Technically poor tracing No significant change since last  tracing Confirmed by [facility] MD, Corene Cornea [phone]) on 12/25/2014 10:48:37 PM      MDM   Final diagnoses:  Hypokalemia  Fall, initial encounter   74 year old male with couple days of worsening fatigue, shortness of breath and fell today hitting his head and his back. Came here for evaluation secondary to the pain. No chest pain. CT and x-rays were all negative. However does have hypokalemia. Magnesium checked and started a dose of magnesium along with 3  runs of potassium. Secondary to have hypokalemia and intraocular ectopy on his monitor I will discuss with the medicine team for overnight observation and telemetry to continue potassium repletion and recheck of labs in the morning.  I personally performed the services described in this documentation, which was scribed in my presence. The recorded information has been reviewed and is accurate.    Merrily Pew, MD 12/26/14 [phone]

## 2014-12-26 ENCOUNTER — Encounter (HOSPITAL_COMMUNITY): Payer: Self-pay

## 2014-12-26 DIAGNOSIS — I10 Essential (primary) hypertension: Secondary | ICD-10-CM

## 2014-12-26 DIAGNOSIS — E1122 Type 2 diabetes mellitus with diabetic chronic kidney disease: Secondary | ICD-10-CM

## 2014-12-26 DIAGNOSIS — E876 Hypokalemia: Secondary | ICD-10-CM | POA: Diagnosis not present

## 2014-12-26 LAB — BASIC METABOLIC PANEL
Anion gap: 9 (ref 5–15)
Anion gap: 5 (ref 5–15)
BUN: 14 mg/dL (ref 6–20)
BUN: 15 mg/dL (ref 6–20)
CALCIUM: 7.9 mg/dL — AB (ref 8.9–10.3)
CALCIUM: 8.5 mg/dL — AB (ref 8.9–10.3)
CO2: 29 mmol/L (ref 22–32)
CO2: 29 mmol/L (ref 22–32)
CREATININE: 1.34 mg/dL — AB (ref 0.61–1.24)
CREATININE: 1.24 mg/dL (ref 0.61–1.24)
Chloride: 103 mmol/L (ref 101–111)
Chloride: 106 mmol/L (ref 101–111)
GFR calc non Af Amer: 52 mL/min — ABNORMAL LOW (ref >60)
GFR calc non Af Amer: 57 mL/min — ABNORMAL LOW (ref >60)
GFR, EST AFRICAN AMERICAN: 60 mL/min — AB (ref >60)
GLUCOSE: 111 mg/dL — AB (ref 65–99)
GLUCOSE: 131 mg/dL — AB (ref 65–99)
Potassium: 2.5 mmol/L — CL (ref 3.5–5.1)
Potassium: 3.1 mmol/L — ABNORMAL LOW (ref 3.5–5.1)
Sodium: 141 mmol/L (ref 135–145)
Sodium: 140 mmol/L (ref 135–145)

## 2014-12-26 MED ORDER — POTASSIUM CHLORIDE CRYS ER 20 MEQ PO TBCR
40.0000 meq | EXTENDED_RELEASE_TABLET | Freq: Once | ORAL | Status: AC
  Administered 2014-12-26: 40 meq via ORAL
  Filled 2014-12-26: qty 2

## 2014-12-26 MED ORDER — POTASSIUM CHLORIDE 10 MEQ/100ML IV SOLN
10.0000 meq | INTRAVENOUS | Status: AC
  Administered 2014-12-26 (×5): 10 meq via INTRAVENOUS
  Filled 2014-12-26 (×4): qty 100

## 2014-12-26 MED ORDER — SODIUM CHLORIDE 0.9 % IV SOLN
INTRAVENOUS | Status: AC
  Administered 2014-12-26 (×2): via INTRAVENOUS

## 2014-12-26 MED ORDER — SODIUM CHLORIDE 0.9 % IJ SOLN
3.0000 mL | Freq: Two times a day (BID) | INTRAMUSCULAR | Status: DC
  Administered 2014-12-26 (×2): 3 mL via INTRAVENOUS

## 2014-12-26 MED ORDER — ENSURE ENLIVE PO LIQD: 237.0000 mL | Freq: Two times a day (BID) | ORAL | Status: DC

## 2014-12-26 MED ORDER — POTASSIUM CHLORIDE 10 MEQ/100ML IV SOLN
10.0000 meq | Freq: Once | INTRAVENOUS | Status: AC
  Administered 2014-12-26: 10 meq via INTRAVENOUS

## 2014-12-26 MED ORDER — ENSURE ENLIVE PO LIQD
237.0000 mL | Freq: Two times a day (BID) | ORAL | Status: DC
  Administered 2014-12-26 (×2): 237 mL via ORAL

## 2014-12-26 MED ORDER — SODIUM CHLORIDE 0.9 % IJ SOLN
3.0000 mL | Freq: Two times a day (BID) | INTRAMUSCULAR | Status: DC
  Administered 2014-12-26: 3 mL via INTRAVENOUS

## 2014-12-26 MED ORDER — SODIUM CHLORIDE 0.9 % IJ SOLN: 3.0000 mL | INTRAMUSCULAR | Status: DC | PRN

## 2014-12-26 MED ORDER — HYDROCODONE-ACETAMINOPHEN 5-325 MG PO TABS: 1.0000 | ORAL_TABLET | ORAL | Status: DC | PRN

## 2014-12-26 MED ORDER — SODIUM CHLORIDE 0.9 % IV SOLN: 250.0000 mL | INTRAVENOUS | Status: DC | PRN

## 2014-12-26 NOTE — Discharge Instructions (Addendum)
Patient alert and oriented, independent, VSS, pt. Tolerating diet well. No complaints of pain or nausea. Pt. Had IV removed tip intact.  Pt. Voiced understanding of discharge instructions with no further questions. Pt. Discharged via wheelchair with auxilliary.

## 2014-12-26 NOTE — Care Management Note (Signed)
Case Management Note  Patient Details  Name: Donald Gaines MRN: [id number] Date of Birth: 07/06/1940  Expected Discharge Date:    12/26/2014              Expected Discharge Plan:  Home/Self Care  In-House Referral:  NA  Discharge planning Services  CM Consult  Post Acute Care Choice:  Durable Medical Equipment Choice offered to:  Patient  DME Arranged:  Nebulizer machine DME Agency:  [facility]  HH Arranged:    [facility] Agency:     Status of Service:  Completed, signed off  Medicare Important Message Given:    Date Medicare IM Given:    Medicare IM give by:    Date Additional Medicare IM Given:    Additional Medicare Important Message give by:     If discussed at [place] of Stay Meetings, dates discussed:    Additional Comments: Admitted with hypokalemia. Pt is from home, lives with sister and is ind at baseline. Pt has home O2 but needs neb machine. Pt ordered neb and pt info faxed to [employer] (per pt choice). Neb will be delivered to pt's home after DC. No further CM needs identified. Pt anticipates DC home with self care later today pending blood work.  Sherald Barge, RN 12/26/2014, 3:14 PM

## 2014-12-26 NOTE — Progress Notes (Signed)
Nutrition Brief Note  Patient identified on the Malnutrition Screening Tool (MST) Report  Wt Readings from Last 15 Encounters:  12/26/14 177 lb 9.6 oz (80.559 kg)  10/22/14 178 lb 14.4 oz (81.149 kg)  09/26/14 175 lb 9.6 oz (79.652 kg)  07/03/14 182 lb 8 oz (82.781 kg)  06/06/14 178 lb (80.74 kg)  04/03/14 176 lb 11.2 oz (80.151 kg)  03/05/14 183 lb (83.008 kg)  12/12/13 176 lb 8 oz (80.06 kg)  12/04/13 175 lb (79.379 kg)  11/21/13 175 lb (79.379 kg)  09/27/13 191 lb 2.2 oz (86.7 kg)  07/13/13 178 lb 8 oz (80.967 kg)  06/02/13 176 lb (79.833 kg)  05/01/13 172 lb 1.6 oz (78.064 kg)  12/27/12 173 lb 14.4 oz (78.881 kg)    Body mass index is 29.09 kg/(m^2). Patient meets criteria for overweight based on current BMI. His weight is within usual body weight range 175-182#.  He endorses a good appetite and he is receiving a Heart Healthy diet. Patient is consuming approximately 100% of meals at this time. Diet hx, labs and medications reviewed. [id number].  No nutrition interventions warranted at this time. If nutrition issues arise, please consult RD.   Colman Cater MS,RD,CSG,LDN Office: [phone] Pager: [phone]

## 2014-12-26 NOTE — ED Notes (Signed)
Called the floor and informed Kristi, RN that patient's 2nd bag of potassium had finished and that he was now on 2 liters of oxygen.

## 2014-12-26 NOTE — H&P (Signed)
PCP:   Wende Neighbors, MD   Chief Complaint:  fall  HPI: 74 yo male comes in after falling off some stairs earlier tonight.   Pt is a very poor historian.  He says he just fell, doesn't know if he tripped on something.  Doesn't know if he passed out.  He has been feeling weak for several days, that's all he knows.  Denies any recent illnesses.  No n/v/d.  Says he use to drink a lot of alcohol but the Lord saved him and he doesn't do this anymore.  Denies any focal symptoms neurologically.  Found in the ED to have no acute bone injuries but with low potassium level.  referrred for admission for his hypokalamia.   Review of Systems:  Positive and negative as per HPI otherwise all other systems are negative  Past Medical History: Past Medical History  Diagnosis Date  . TIA (transient ischemic attack)     2005  . GERD (gastroesophageal reflux disease)   . Hyperlipidemia   . Essential hypertension, benign   . Depression   . Diabetes mellitus, type 2 ([place])   . Restrictive lung disease   . Candida esophagitis ([place])   . Hemorrhoids   . Arteriovenous malformation small bowel     Capsule study 3/10  . GI bleeding     Cecal ulcers, arteriovenous malformations  . Chronic diarrhea SEP 2011    ?ETIOLOGY-DIABETIC ENTEROPATHY, LACTOSE INTOLERANCE,  OR IBS-MIXED  . Bloating AUG 2009    NL HBT FOR SIBO  . Anemia FEB 2008 FEDA 2o to AVMs, & large colon ulcers    HB 10.7 MCV 74.3; Dr Tressie Stalker  . BPH (benign prostatic hypertrophy)     Dr [place] Pink  . Glaucoma   . Cataract   . Bilateral carotid artery stenosis     50-75% external left carotid stenosis  . Coronary atherosclerosis of native coronary artery     Multivessel, BMS SVG TO RCA 2000, reportedly  "2" additional stents in interim  . Myocardial infarction ([place])     1994  . COPD (chronic obstructive pulmonary disease) ([place])   . Sleep apnea   . Portal hypertensive gastropathy 11/08/2012    EGD. Dr. Laural Golden  . Dementia    Past Surgical  History  Procedure Laterality Date  . Esophagogastroduodenoscopy  SEP 09    DUODENAL LIPOMA  . Capsule endo  03/10  . Right inguinal hernia repair    . Hydrogen breath test  2009  . Colonoscopy  APR 2008    SIMPLE ADENOMA, [place] TICS, IH  . Colonoscopy  FEB 2008 ANEMIA. [facility]LE[facility]     2 LARGE ILEOCEAL ULCERS 2o to ASA, [place] TICS, IH  . Colonoscopy  SEP 2009 TRANSFUSION DEP ANEMIA    AC AVM-ABLATED, [place] TICS, IH  . Upper gastrointestinal endoscopy  SEP 2009    GASTRIC AVM ABLATED, NL DUO Bx  . Cardiac catherization  04/2012  . Cardiac catheterization  04/05/1996 [facility]    EF 45%, occluded SVG to circumflex, patent SVG to D1 and PDA and patent LIMA to LAD with severe native vessel disease.  . Cardiac catheterization  08/22/1998 [facility]    Mild LM, severe LAD,severe CX, occlude RCA, patient  SVG to diatal RCA, patent vein graft to D1 and patent LIMA to LAD.   Marland Kitchen Coronary angioplasty with stent placement  08/22/1998 [name]    TEC stenting of the SVG to RCA-Last seen by cardiologist in 2010.  Marland Kitchen Coronary artery  bypass graft  1995-triple bypass    LIMA-LAD, LIMA to intermediate branch, SVG to PD and second OM,  . Cataract extraction w/phaco Right 09/22/2012    Procedure: CATARACT EXTRACTION PHACO AND INTRAOCULAR LENS PLACEMENT (IOC);  Surgeon: Tonny Branch, MD;  Location: [facility] ORS;  Service: Ophthalmology;  Laterality: Right;  CDE: 11.43  . Cataract extraction w/phaco Left 10/17/2012    Procedure: CATARACT EXTRACTION PHACO AND INTRAOCULAR LENS PLACEMENT (IOC);  Surgeon: Tonny Branch, MD;  Location: [facility] OR[facility];  Service: Ophthalmology;  Laterality: Left;  CDE: 8.06  . Colonoscopy with esophagogastroduodenoscopy (egd) N/A 11/08/2012    Procedure: COLONOSCOPY WITH ESOPHAGOGASTRODUODENOSCOPY (EGD);  Surgeon: Rogene Houston, MD;  Location: [facility];  Service: Endoscopy;  Laterality: N/A;  250-rescheduled to 7:30 Ann to notify pt  . [place] capsule study N/A 11/24/2012    Procedure: GIVENS CAPSULE STUDY;   Surgeon: Rogene Houston, MD;  Location: [facility];  Service: Endoscopy;  Laterality: N/A;  730  . Esophagogastroduodenoscopy N/A 09/27/2013    Procedure: ESOPHAGOGASTRODUODENOSCOPY (EGD) Push enteroscopy;  Surgeon: Rogene Houston, MD;  Location: [facility];  Service: Endoscopy;  Laterality: N/A;  . Hot hemostasis  09/27/2013    Procedure: HOT HEMOSTASIS (ARGON PLASMA COAGULATION/BICAP);  Surgeon: Rogene Houston, MD;  Location: [facility];  Service: Endoscopy;;    Medications: Prior to Admission medications   Medication Sig Start Date End Date Taking? Authorizing Provider  ADVAIR DISKUS 250-50 MCG/DOSE AEPB Inhale 1 puff into the lungs 2 (two) times daily.  10/02/10  Yes Historical Provider, MD  albuterol (PROAIR HFA) 108 (90 BASE) MCG/ACT inhaler Inhale 2 puffs into the lungs every 6 (six) hours as needed for wheezing or shortness of breath.   Yes Historical Provider, MD  ALPRAZolam Duanne Moron) 0.5 MG tablet Take 0.5 mg by mouth 2 (two) times daily as needed for anxiety.    Yes Historical Provider, MD  clopidogrel (PLAVIX) 75 MG tablet Take 1 tablet (75 mg total) by mouth every morning. 09/27/13  Yes Nita Sells, MD  NITROSTAT 0.4 MG SL tablet Place 0.4 mg under the tongue every 5 (five) minutes as needed for chest pain.  05/23/10  Yes Historical Provider, MD  carvedilol (COREG) 12.5 MG tablet Take 12.5 mg by mouth 2 (two) times daily with a meal.  03/26/14   Historical Provider, MD  cetirizine (ZYRTEC) 10 MG tablet Take 10 mg by mouth daily.    Historical Provider, MD  Ferrous Gluconate (IRON) 240 (27 FE) MG TABS Take 27 mg by mouth 2 (two) times daily. 10/22/14   Rogene Houston, MD  furosemide (LASIX) 40 MG tablet Take 40 mg by mouth 2 (two) times daily.     Historical Provider, MD  gabapentin (NEURONTIN) 100 MG capsule Take 100 mg by mouth at bedtime.  09/20/14   Historical Provider, MD  HYDROcodone-acetaminophen (NORCO/VICODIN) 5-325 MG per tablet Take 1 tablet by mouth every 6 (six)  hours as needed. 06/06/14   Milton Moree, MD  isosorbide mononitrate (IMDUR) 60 MG 24 hr tablet Take 1 tablet (60 mg total) by mouth daily. 06/01/13   Lendon Colonel, NP  JANUMET 50-1000 MG per tablet Take 1 tablet by mouth 2 (two) times daily with a meal.  07/22/13   Historical Provider, MD  lidocaine-prilocaine (EMLA) cream Apply 1 application topically once.  09/25/14   Historical Provider, MD  losartan (COZAAR) 25 MG tablet Take 1 tablet (25 mg total) by mouth daily. 12/04/13   Satira Sark,  MD  omeprazole (PRILOSEC) 20 MG capsule Take 20 mg by mouth daily.  08/28/13   Historical Provider, MD  rosuvastatin (CRESTOR) 20 MG tablet Take 1 tablet (20 mg total) by mouth daily. 12/12/13   Satira Sark, MD  triamcinolone cream (KENALOG) 0.1 % Apply 1 application topically 2 (two) times daily.  09/11/14   Historical Provider, MD    Allergies:   Allergies  Allergen Reactions  . Penicillins Rash    Social History:  reports that he has been smoking Cigarettes.  He has a 60 pack-year smoking history. He has never used smokeless tobacco. He reports that he uses illicit drugs (Marijuana). He reports that he does not drink alcohol.  Family History: Family History  Problem Relation Age of Onset  . Heart attack Mother   . Heart disease Mother   . Hypertension Mother     Physical Exam: Filed Vitals:   12/26/14 0030 12/26/14 0045 12/26/14 0113 12/26/14 0134  BP: 125/84  126/68 126/72  Pulse: 80 78 76 86  Temp:    97.9 F (36.6 C)  TempSrc:    Oral  Resp: 20 19 20 22   Height:    5' 5.5" (1.664 m)  Weight:    80.559 kg (177 lb 9.6 oz)  SpO2: 88% 92% 89% 98%   General appearance: alert, cooperative and no distress Head: Normocephalic, without obvious abnormality, atraumatic Eyes: negative Nose: Nares normal. Septum midline. Mucosa normal. No drainage or sinus tenderness. Neck: no JVD and supple, symmetrical, trachea midline Lungs: clear to auscultation bilaterally Heart: regular rate  and rhythm, S1, S2 normal, no murmur, click, rub or gallop Abdomen: soft, non-tender; bowel sounds normal; no masses,  no organomegaly Extremities: extremities normal, atraumatic, no cyanosis or edema Pulses: 2+ and symmetric Skin: Skin color, texture, turgor normal. No rashes or lesions Neurologic: Grossly normal    Labs on Admission:   Recent Labs  12/25/14 2219  NA 143  K 2.3*  CL 103  CO2 32  GLUCOSE 172*  BUN 16  CREATININE 1.49*  CALCIUM 9.0  MG 1.9    Recent Labs  12/25/14 2219  AST 28  ALT 22  ALKPHOS 83  BILITOT 0.9  PROT 6.8  ALBUMIN 3.9    Recent Labs  12/25/14 2219  WBC 10.4  NEUTROABS 8.1*  HGB 11.8*  HCT 35.9*  MCV 92.1  PLT 225    Radiological Exams on Admission: Dg Chest 2 View  12/25/2014   CLINICAL DATA:  [place] backwards on steps  EXAM: CHEST  2 VIEW  COMPARISON:  06/06/2014  FINDINGS: There is no pneumothorax or effusion. Mediastinal contours are unremarkable and unchanged. There is unchanged moderate cardiomegaly. The lungs are clear except for chronic interstitial coarsening. No displaced fractures are evident.  IMPRESSION: Chronic interstitial coarsening.  Cardiomegaly.  No acute findings.   Electronically Signed   By: Andreas Newport M.D.   On: 12/25/2014 23:19   Dg Thoracic Spine W/swimmers  12/25/2014   CLINICAL DATA:  [place] backwards on steps  EXAM: THORACIC SPINE - 3 VIEWS  COMPARISON:  06/06/2014  FINDINGS: There is no evidence of thoracic spine fracture. Alignment is normal. No other significant bone abnormalities are identified.  IMPRESSION: Negative.   Electronically Signed   By: Andreas Newport M.D.   On: 12/25/2014 23:15   Dg Lumbar Spine Complete  12/25/2014   CLINICAL DATA:  [place] backwards on steps  EXAM: LUMBAR SPINE - COMPLETE 4+ VIEW  COMPARISON:  07/13/2013  FINDINGS: There  is no evidence of lumbar spine fracture. Alignment is normal. Intervertebral disc spaces are maintained.  IMPRESSION: Negative.   Electronically  Signed   By: Andreas Newport M.D.   On: 12/25/2014 23:17   Dg Pelvis 1-2 Views  12/25/2014   CLINICAL DATA:  [place] backwards on steps  EXAM: PELVIS - 1-2 VIEW  COMPARISON:  None.  FINDINGS: Negative for acute fracture. Moderate arthritic changes are present about the left hip. Pubic symphysis and sacroiliac joints appear grossly intact.  IMPRESSION: Negative for acute fracture   Electronically Signed   By: Andreas Newport M.D.   On: 12/25/2014 23:20   Ct Head Wo Contrast  12/25/2014   CLINICAL DATA:  Sudden onset of right-sided headache after fall.  EXAM: CT HEAD WITHOUT CONTRAST  CT CERVICAL SPINE WITHOUT CONTRAST  TECHNIQUE: Multidetector CT imaging of the head and cervical spine was performed following the standard protocol without intravenous contrast. Multiplanar CT image reconstructions of the cervical spine were also generated.  COMPARISON:  11/21/2013  FINDINGS: CT HEAD FINDINGS  Low density in the periventricular white matter and external capsules are compatible with the chronic ischemic changes of small vessel disease. No mass effect, midline shift, or acute hemorrhage. The left maxillary sinus is opacified. Mastoid air cells are clear. No skull fractures.  CT CERVICAL SPINE FINDINGS  No fracture. No dislocation. Degenerative changes are seen throughout the cervical spine. There is spinal stenosis at C3-4 secondary to congenital spinal narrowing and posterior disc osteophytes. This occurs to a lesser degree at other levels. No obvious soft tissue injury.  IMPRESSION: No acute intracranial pathology. No evidence of cervical spine injury. Degenerative changes and spinal stenosis are noted.   Electronically Signed   By: Marybelle Killings M.D.   On: 12/25/2014 23:20   Ct Cervical Spine Wo Contrast  12/25/2014   CLINICAL DATA:  Sudden onset of right-sided headache after fall.  EXAM: CT HEAD WITHOUT CONTRAST  CT CERVICAL SPINE WITHOUT CONTRAST  TECHNIQUE: Multidetector CT imaging of the head and cervical  spine was performed following the standard protocol without intravenous contrast. Multiplanar CT image reconstructions of the cervical spine were also generated.  COMPARISON:  11/21/2013  FINDINGS: CT HEAD FINDINGS  Low density in the periventricular white matter and external capsules are compatible with the chronic ischemic changes of small vessel disease. No mass effect, midline shift, or acute hemorrhage. The left maxillary sinus is opacified. Mastoid air cells are clear. No skull fractures.  CT CERVICAL SPINE FINDINGS  No fracture. No dislocation. Degenerative changes are seen throughout the cervical spine. There is spinal stenosis at C3-4 secondary to congenital spinal narrowing and posterior disc osteophytes. This occurs to a lesser degree at other levels. No obvious soft tissue injury.  IMPRESSION: No acute intracranial pathology. No evidence of cervical spine injury. Degenerative changes and spinal stenosis are noted.   Electronically Signed   By: Marybelle Killings M.D.   On: 12/25/2014 23:20    Assessment/Plan  74 yo male with fall , general weakness and hypokalemia  Principal Problem:   Hypokalemia=  Replete with iv and po kcl.  Pt appears dry, will check orthostatics and give ivf overnight.  ekg bigeminy.  Magnesium level is normal.  Active Problems:   Type 2 diabetes mellitus ([place])- noted   Essential hypertension, benign- noted   General weakness ???syncope -  Check orthostatics.  No focal neuro deficits.  Sister was present when this happen, may be useful to get info from her in the  am.  Will not order mri or echo at this time.  Check orthostatics first before ivf start.  Clarify home med list.  obs on tele.  Full code.  Esmond Hinch A 12/26/2014, 2:46 AM

## 2014-12-26 NOTE — Discharge Summary (Signed)
Physician Discharge Summary  Donald Gaines [id number] DOB: Aug 16, 1940 DOA: 12/25/2014  PCP: Wende Neighbors, MD  Admit date: 12/25/2014 Discharge date: 12/26/2014  Time spent: 45 minutes  Recommendations for Outpatient Follow-up:  -Will be discharged home today. -Advised to follow up with PCP in 1 week for repeat BMET to follow K levels.   Discharge Diagnoses:  Principal Problem:   Hypokalemia Active Problems:   Type 2 diabetes mellitus (H[facility])   Essential hypertension, benign   Discharge Condition: Stable and improved  Filed Weights   12/25/14 2136 12/26/14 0134  Weight: 80.74 kg (178 lb) 80.559 kg (177 lb 9.6 oz)    History of present illness:  As per Dr. Shanon Brow 10/5: 74 yo male comes in after falling off some stairs earlier tonight. Pt is a very poor historian. He says he just fell, doesn't know if he tripped on something. Doesn't know if he passed out. He has been feeling weak for several days, that's all he knows. Denies any recent illnesses. No n/v/d. Says he use to drink a lot of alcohol but the Lord saved him and he doesn't do this anymore. Denies any focal symptoms neurologically. Found in the ED to have no acute bone injuries but with low potassium level. referrred for admission for his hypokalamia.  Hospital Course:   Hypokalemia -2.3 on admission. -Most recent check is 3.1. -Will be given an extra 40 meq orally prior to DC. -Advised to follow up with PCP in 1 week to recheck K levels.  Fall/General Weakness -Has been ambulating without issues today. -Suspect related to hypokalemia in part.  DM II -Well controlled, continue current regimen.  HTN -Well controlled, continue current regimen  Procedures:  None   Consultations:  None  Discharge Instructions  Discharge Instructions    Diet - low sodium heart healthy    Complete by:  As directed      Increase activity slowly    Complete by:  As directed             Medication List      TAKE these medications        ADVAIR DISKUS 250-50 MCG/DOSE Aepb  Generic drug:  Fluticasone-Salmeterol  Inhale 1 puff into the lungs 2 (two) times daily.     ALPRAZolam 0.5 MG tablet  Commonly known as:  XANAX  Take 0.5 mg by mouth 2 (two) times daily as needed for anxiety.     carvedilol 12.5 MG tablet  Commonly known as:  COREG  Take 12.5 mg by mouth 2 (two) times daily with a meal.     cetirizine 10 MG tablet  Commonly known as:  ZYRTEC  Take 10 mg by mouth daily.     clopidogrel 75 MG tablet  Commonly known as:  PLAVIX  Take 1 tablet (75 mg total) by mouth every morning.     furosemide 40 MG tablet  Commonly known as:  LASIX  Take 40 mg by mouth 2 (two) times daily.     gabapentin 100 MG capsule  Commonly known as:  NEURONTIN  Take 100 mg by mouth at bedtime.     HYDROcodone-acetaminophen 5-325 MG tablet  Commonly known as:  NORCO/VICODIN  Take 1 tablet by mouth every 6 (six) hours as needed.     Iron 240 (27 FE) MG Tabs  Take 27 mg by mouth 2 (two) times daily.     isosorbide mononitrate 60 MG 24 hr tablet  Commonly known as:  IMDUR  Take 1 tablet (60 mg total) by mouth daily.     JANUMET 50-1000 MG tablet  Generic drug:  sitaGLIPtin-metformin  Take 1 tablet by mouth 2 (two) times daily with a meal.     lidocaine-prilocaine cream  Commonly known as:  EMLA  Apply 1 application topically once.     losartan 25 MG tablet  Commonly known as:  COZAAR  Take 1 tablet (25 mg total) by mouth daily.     NITROSTAT 0.4 MG SL tablet  Generic drug:  nitroGLYCERIN  Place 0.4 mg under the tongue every 5 (five) minutes as needed for chest pain.     omeprazole 20 MG capsule  Commonly known as:  PRILOSEC  Take 20 mg by mouth daily.     PROAIR HFA 108 (90 BASE) MCG/ACT inhaler  Generic drug:  albuterol  Inhale 2 puffs into the lungs every 6 (six) hours as needed for wheezing or shortness of breath.     rosuvastatin 20 MG tablet  Commonly known as:  CRESTOR   Take 1 tablet (20 mg total) by mouth daily.     triamcinolone cream 0.1 %  Commonly known as:  KENALOG  Apply 1 application topically 2 (two) times daily.       Allergies  Allergen Reactions  . Penicillins Rash       Follow-up Information    Follow up with Wende Neighbors, MD. Schedule an appointment as soon as possible for a visit in 1 week.   Specialty:  Internal Medicine   Contact information:   [place] [address] [phone]        The results of significant diagnostics from this hospitalization (including imaging, microbiology, ancillary and laboratory) are listed below for reference.    Significant Diagnostic Studies: Dg Chest 2 View  12/25/2014   CLINICAL DATA:  [place] backwards on steps  EXAM: CHEST  2 VIEW  COMPARISON:  06/06/2014  FINDINGS: There is no pneumothorax or effusion. Mediastinal contours are unremarkable and unchanged. There is unchanged moderate cardiomegaly. The lungs are clear except for chronic interstitial coarsening. No displaced fractures are evident.  IMPRESSION: Chronic interstitial coarsening.  Cardiomegaly.  No acute findings.   Electronically Signed   By: Andreas Newport M.D.   On: 12/25/2014 23:19   Dg Thoracic Spine W/swimmers  12/25/2014   CLINICAL DATA:  [place] backwards on steps  EXAM: THORACIC SPINE - 3 VIEWS  COMPARISON:  06/06/2014  FINDINGS: There is no evidence of thoracic spine fracture. Alignment is normal. No other significant bone abnormalities are identified.  IMPRESSION: Negative.   Electronically Signed   By: Andreas Newport M.D.   On: 12/25/2014 23:15   Dg Lumbar Spine Complete  12/25/2014   CLINICAL DATA:  [place] backwards on steps  EXAM: LUMBAR SPINE - COMPLETE 4+ VIEW  COMPARISON:  07/13/2013  FINDINGS: There is no evidence of lumbar spine fracture. Alignment is normal. Intervertebral disc spaces are maintained.  IMPRESSION: Negative.   Electronically Signed   By: Andreas Newport M.D.   On: 12/25/2014 23:17    Dg Pelvis 1-2 Views  12/25/2014   CLINICAL DATA:  [place] backwards on steps  EXAM: PELVIS - 1-2 VIEW  COMPARISON:  None.  FINDINGS: Negative for acute fracture. Moderate arthritic changes are present about the left hip. Pubic symphysis and sacroiliac joints appear grossly intact.  IMPRESSION: Negative for acute fracture   Electronically Signed   By: Andreas Newport M.D.   On: 12/25/2014 23:20  Ct Head Wo Contrast  12/25/2014   CLINICAL DATA:  Sudden onset of right-sided headache after fall.  EXAM: CT HEAD WITHOUT CONTRAST  CT CERVICAL SPINE WITHOUT CONTRAST  TECHNIQUE: Multidetector CT imaging of the head and cervical spine was performed following the standard protocol without intravenous contrast. Multiplanar CT image reconstructions of the cervical spine were also generated.  COMPARISON:  11/21/2013  FINDINGS: CT HEAD FINDINGS  Low density in the periventricular white matter and external capsules are compatible with the chronic ischemic changes of small vessel disease. No mass effect, midline shift, or acute hemorrhage. The left maxillary sinus is opacified. Mastoid air cells are clear. No skull fractures.  CT CERVICAL SPINE FINDINGS  No fracture. No dislocation. Degenerative changes are seen throughout the cervical spine. There is spinal stenosis at C3-4 secondary to congenital spinal narrowing and posterior disc osteophytes. This occurs to a lesser degree at other levels. No obvious soft tissue injury.  IMPRESSION: No acute intracranial pathology. No evidence of cervical spine injury. Degenerative changes and spinal stenosis are noted.   Electronically Signed   By: Marybelle Killings M.D.   On: 12/25/2014 23:20   Ct Cervical Spine Wo Contrast  12/25/2014   CLINICAL DATA:  Sudden onset of right-sided headache after fall.  EXAM: CT HEAD WITHOUT CONTRAST  CT CERVICAL SPINE WITHOUT CONTRAST  TECHNIQUE: Multidetector CT imaging of the head and cervical spine was performed following the standard protocol without  intravenous contrast. Multiplanar CT image reconstructions of the cervical spine were also generated.  COMPARISON:  11/21/2013  FINDINGS: CT HEAD FINDINGS  Low density in the periventricular white matter and external capsules are compatible with the chronic ischemic changes of small vessel disease. No mass effect, midline shift, or acute hemorrhage. The left maxillary sinus is opacified. Mastoid air cells are clear. No skull fractures.  CT CERVICAL SPINE FINDINGS  No fracture. No dislocation. Degenerative changes are seen throughout the cervical spine. There is spinal stenosis at C3-4 secondary to congenital spinal narrowing and posterior disc osteophytes. This occurs to a lesser degree at other levels. No obvious soft tissue injury.  IMPRESSION: No acute intracranial pathology. No evidence of cervical spine injury. Degenerative changes and spinal stenosis are noted.   Electronically Signed   By: Marybelle Killings M.D.   On: 12/25/2014 23:20    Microbiology: No results found for this or any previous visit (from the past 240 hour(s)).   Labs: Basic Metabolic Panel:  Recent Labs Lab 12/25/14 2219 12/26/14 0417 12/26/14 1356  NA 143 141 140  K 2.3* 2.5* 3.1*  CL 103 103 106  CO2 32 29 29  GLUCOSE 172* 111* 131*  BUN 16 14 15   CREATININE 1.49* 1.34* 1.24  CALCIUM 9.0 7.9* 8.5*  MG 1.9  --   --    Liver Function Tests:  Recent Labs Lab 12/25/14 2219  AST 28  ALT 22  ALKPHOS 83  BILITOT 0.9  PROT 6.8  ALBUMIN 3.9   No results for input(s): LIPASE, AMYLASE in the last 168 hours. No results for input(s): AMMONIA in the last 168 hours. CBC:  Recent Labs Lab 12/25/14 2219  WBC 10.4  NEUTROABS 8.1*  HGB 11.8*  HCT 35.9*  MCV 92.1  PLT 225   Cardiac Enzymes: No results for input(s): CKTOTAL, CKMB, CKMBINDEX, TROPONINI in the last 168 hours. BNP: BNP (last 3 results)  Recent Labs  06/06/14 1640  BNP 575.0*    ProBNP (last 3 results) No results for input(s): PROBNP in  the  last 8760 hours.  CBG: No results for input(s): GLUCAP in the last 168 hours.     SignedLelon Frohlich  [facility] Pager: [phone] 12/26/2014, 3:24 PM

## 2014-12-27 DIAGNOSIS — J449 Chronic obstructive pulmonary disease, unspecified: Secondary | ICD-10-CM | POA: Diagnosis not present

## 2014-12-29 DIAGNOSIS — J449 Chronic obstructive pulmonary disease, unspecified: Secondary | ICD-10-CM | POA: Diagnosis not present

## 2014-12-31 DIAGNOSIS — R69 Illness, unspecified: Secondary | ICD-10-CM | POA: Diagnosis not present

## 2014-12-31 DIAGNOSIS — E876 Hypokalemia: Secondary | ICD-10-CM | POA: Diagnosis not present

## 2015-01-02 DIAGNOSIS — R69 Illness, unspecified: Secondary | ICD-10-CM | POA: Diagnosis not present

## 2015-01-08 ENCOUNTER — Ambulatory Visit: Payer: Medicare HMO | Admitting: Orthopedic Surgery

## 2015-01-17 ENCOUNTER — Encounter (INDEPENDENT_AMBULATORY_CARE_PROVIDER_SITE_OTHER): Payer: Self-pay | Admitting: *Deleted

## 2015-01-17 ENCOUNTER — Other Ambulatory Visit (INDEPENDENT_AMBULATORY_CARE_PROVIDER_SITE_OTHER): Payer: Self-pay | Admitting: *Deleted

## 2015-01-17 DIAGNOSIS — D509 Iron deficiency anemia, unspecified: Secondary | ICD-10-CM

## 2015-01-21 DIAGNOSIS — R69 Illness, unspecified: Secondary | ICD-10-CM | POA: Diagnosis not present

## 2015-01-22 DIAGNOSIS — E782 Mixed hyperlipidemia: Secondary | ICD-10-CM | POA: Diagnosis not present

## 2015-01-22 DIAGNOSIS — D509 Iron deficiency anemia, unspecified: Secondary | ICD-10-CM | POA: Diagnosis not present

## 2015-01-22 DIAGNOSIS — E1165 Type 2 diabetes mellitus with hyperglycemia: Secondary | ICD-10-CM | POA: Diagnosis not present

## 2015-01-24 DIAGNOSIS — I2510 Atherosclerotic heart disease of native coronary artery without angina pectoris: Secondary | ICD-10-CM | POA: Diagnosis not present

## 2015-01-24 DIAGNOSIS — J449 Chronic obstructive pulmonary disease, unspecified: Secondary | ICD-10-CM | POA: Diagnosis not present

## 2015-01-24 DIAGNOSIS — R69 Illness, unspecified: Secondary | ICD-10-CM | POA: Diagnosis not present

## 2015-01-24 DIAGNOSIS — Z23 Encounter for immunization: Secondary | ICD-10-CM | POA: Diagnosis not present

## 2015-01-24 DIAGNOSIS — N183 Chronic kidney disease, stage 3 (moderate): Secondary | ICD-10-CM | POA: Diagnosis not present

## 2015-01-24 DIAGNOSIS — G629 Polyneuropathy, unspecified: Secondary | ICD-10-CM | POA: Diagnosis not present

## 2015-01-27 DIAGNOSIS — J449 Chronic obstructive pulmonary disease, unspecified: Secondary | ICD-10-CM | POA: Diagnosis not present

## 2015-01-29 DIAGNOSIS — J449 Chronic obstructive pulmonary disease, unspecified: Secondary | ICD-10-CM | POA: Diagnosis not present

## 2015-01-30 ENCOUNTER — Encounter (INDEPENDENT_AMBULATORY_CARE_PROVIDER_SITE_OTHER): Payer: Self-pay

## 2015-02-26 DIAGNOSIS — J449 Chronic obstructive pulmonary disease, unspecified: Secondary | ICD-10-CM | POA: Diagnosis not present

## 2015-02-28 DIAGNOSIS — J449 Chronic obstructive pulmonary disease, unspecified: Secondary | ICD-10-CM | POA: Diagnosis not present

## 2015-03-29 DIAGNOSIS — J449 Chronic obstructive pulmonary disease, unspecified: Secondary | ICD-10-CM | POA: Diagnosis not present

## 2015-03-31 DIAGNOSIS — J449 Chronic obstructive pulmonary disease, unspecified: Secondary | ICD-10-CM | POA: Diagnosis not present

## 2015-04-02 ENCOUNTER — Ambulatory Visit: Payer: Medicare HMO | Admitting: Cardiology

## 2015-04-29 DIAGNOSIS — J449 Chronic obstructive pulmonary disease, unspecified: Secondary | ICD-10-CM | POA: Diagnosis not present

## 2015-04-30 ENCOUNTER — Ambulatory Visit (INDEPENDENT_AMBULATORY_CARE_PROVIDER_SITE_OTHER): Payer: Medicare HMO | Admitting: Internal Medicine

## 2015-04-30 ENCOUNTER — Encounter (INDEPENDENT_AMBULATORY_CARE_PROVIDER_SITE_OTHER): Payer: Self-pay | Admitting: Internal Medicine

## 2015-04-30 VITALS — BP 106/66 | HR 64 | Temp 97.9°F | Resp 18 | Ht 65.0 in | Wt 175.4 lb

## 2015-04-30 DIAGNOSIS — K922 Gastrointestinal hemorrhage, unspecified: Secondary | ICD-10-CM

## 2015-04-30 DIAGNOSIS — K219 Gastro-esophageal reflux disease without esophagitis: Secondary | ICD-10-CM | POA: Diagnosis not present

## 2015-04-30 DIAGNOSIS — D509 Iron deficiency anemia, unspecified: Secondary | ICD-10-CM | POA: Diagnosis not present

## 2015-04-30 DIAGNOSIS — K7400 Hepatic fibrosis, unspecified: Secondary | ICD-10-CM

## 2015-04-30 NOTE — Patient Instructions (Signed)
Hemoccult 1. Call if he have rectal bleeding. Please make sure we get a copy of your next blood work.

## 2015-04-30 NOTE — Progress Notes (Signed)
Presenting complaint;  Follow-up for iron deficiency anemia and GERD.  Subjective:  Patient is 75 year old Caucasian male who is here for scheduled visit. He was last seen on 10/22/2014. He continues to feel better. He states he is not using cane to move around. Heartburn is well controlled. He denies dysphagia abdominal pain melena or rectal bleeding. Stools at times and start because he is on iron. He does complain of chronic leg and back pain. However he does not take pain medication every day. His only complaint is one of feeling tired and fatigued all the time. He states he will have blood work by Dr. Wende Neighbors in the next 2 months or so.    Current Medications: Outpatient Encounter Prescriptions as of 04/30/2015  Medication Sig  . ADVAIR DISKUS 250-50 MCG/DOSE AEPB Inhale 1 puff into the lungs 2 (two) times daily.   Marland Kitchen albuterol (PROAIR HFA) 108 (90 BASE) MCG/ACT inhaler Inhale 2 puffs into the lungs every 6 (six) hours as needed for wheezing or shortness of breath.  . ALPRAZolam (XANAX) 0.5 MG tablet Take 0.5 mg by mouth 2 (two) times daily as needed for anxiety.   . carvedilol (COREG) 12.5 MG tablet Take 12.5 mg by mouth 2 (two) times daily with a meal.   . cetirizine (ZYRTEC) 10 MG tablet Take 10 mg by mouth daily.  . clopidogrel (PLAVIX) 75 MG tablet Take 1 tablet (75 mg total) by mouth every morning.  . escitalopram (LEXAPRO) 5 MG tablet Take 5 mg by mouth daily.   . Ferrous Gluconate (IRON) 240 (27 FE) MG TABS Take 27 mg by mouth 2 (two) times daily.  . furosemide (LASIX) 40 MG tablet Take 40 mg by mouth 2 (two) times daily.   Marland Kitchen gabapentin (NEURONTIN) 100 MG capsule Take 100 mg by mouth at bedtime.   Marland Kitchen HYDROcodone-acetaminophen (NORCO/VICODIN) 5-325 MG tablet Take 1 tablet by mouth at bedtime as needed for moderate pain.  . isosorbide mononitrate (IMDUR) 60 MG 24 hr tablet Take 1 tablet (60 mg total) by mouth daily.  Marland Kitchen JANUMET 50-1000 MG per tablet Take 1 tablet by mouth 2 (two)  times daily with a meal.   . losartan (COZAAR) 25 MG tablet Take 1 tablet (25 mg total) by mouth daily.  Marland Kitchen NITROSTAT 0.4 MG SL tablet Place 0.4 mg under the tongue every 5 (five) minutes as needed for chest pain.   Marland Kitchen omeprazole (PRILOSEC) 20 MG capsule Take 20 mg by mouth daily.   . potassium chloride SA (K-DUR,KLOR-CON) 20 MEQ tablet 20 mEq 2 (two) times daily.   . rosuvastatin (CRESTOR) 20 MG tablet Take 1 tablet (20 mg total) by mouth daily.  . [DISCONTINUED] lidocaine-prilocaine (EMLA) cream Apply 1 application topically once. Reported on 04/30/2015  . [DISCONTINUED] triamcinolone cream (KENALOG) 0.1 % Apply 1 application topically 2 (two) times daily. Reported on 04/30/2015   No facility-administered encounter medications on file as of 04/30/2015.     Objective: Blood pressure 106/66, pulse 64, temperature 97.9 F (36.6 C), temperature source Oral, resp. rate 18, height 5\' 5"  (1.651 m), weight 175 lb 6.4 oz (79.561 kg). Patient is alert and does not have asterixis. Conjunctiva is pink. Sclera is nonicteric Oropharyngeal mucosa is normal. No neck masses or thyromegaly noted. Cardiac exam with irregular rhythm normal S1 and S2. No murmur or gallop noted. Lungs are clear to auscultation. Abdomen is soft and nontender without organomegaly or masses. No LE edema or clubbing noted.  Labs/studies Results: Lab data from 01/22/2015 WBC  8.1, H&H 12.7 and 39.5. MCV 92.5 and platelet count 209K Bilirubin 0.7, AP 95, AST 20, ALT 9, total protein 7.0, albumin 4.2 and calcium 9.5. BUN 18 and creatinine 1.37. Serum ferritin 18(22 to 322).   Assessment:  #1. History of iron deficiency anemia secondary to chronic GI blood loss from small bowel angiodysplasia. This is the first time that his hemoglobin has been close to normal for more than 2 years. He possibly still losing blood from his small bowel intermittently given that he is on clopidogrel. Therefore he needs to stay on iron chronically. He is  scheduled to have blood work by Dr. Wende Neighbors within the next 2 months. #2. GERD. Heartburn is well controlled with therapy. He is on omeprazole at a low dose of 20 mg. #3. History of hepatic fibrosis based on elastography. He does not have stigmata of cirrhosis. Etiology is felt to be NASH. No workup at this time.   Plan:  Once again patient reminded to refrain from using OTC NSAIDs. Hemoccult 1. Patient will bring a copy of his next blood work which she will have within 2-3 months. Patient will call if he has rectal bleeding or melena. Office visit in 6 months.

## 2015-05-01 DIAGNOSIS — J449 Chronic obstructive pulmonary disease, unspecified: Secondary | ICD-10-CM | POA: Diagnosis not present

## 2015-05-27 ENCOUNTER — Encounter: Payer: Self-pay | Admitting: Cardiology

## 2015-05-27 ENCOUNTER — Ambulatory Visit (INDEPENDENT_AMBULATORY_CARE_PROVIDER_SITE_OTHER): Payer: Medicare HMO | Admitting: Cardiology

## 2015-05-27 VITALS — BP 100/54 | HR 91 | Ht 65.0 in | Wt 181.0 lb

## 2015-05-27 DIAGNOSIS — I2510 Atherosclerotic heart disease of native coronary artery without angina pectoris: Secondary | ICD-10-CM | POA: Diagnosis not present

## 2015-05-27 DIAGNOSIS — I482 Chronic atrial fibrillation, unspecified: Secondary | ICD-10-CM

## 2015-05-27 DIAGNOSIS — E782 Mixed hyperlipidemia: Secondary | ICD-10-CM

## 2015-05-27 DIAGNOSIS — I10 Essential (primary) hypertension: Secondary | ICD-10-CM

## 2015-05-27 DIAGNOSIS — I779 Disorder of arteries and arterioles, unspecified: Secondary | ICD-10-CM

## 2015-05-27 DIAGNOSIS — J449 Chronic obstructive pulmonary disease, unspecified: Secondary | ICD-10-CM | POA: Diagnosis not present

## 2015-05-27 DIAGNOSIS — F17200 Nicotine dependence, unspecified, uncomplicated: Secondary | ICD-10-CM

## 2015-05-27 DIAGNOSIS — I739 Peripheral vascular disease, unspecified: Secondary | ICD-10-CM

## 2015-05-27 NOTE — Progress Notes (Signed)
Cardiology Office Note  Date: 05/27/2015   ID: KREIGHTON BEANS, DOB August 31, 1940, MRN [id number]  PCP: Wende Neighbors, MD  Primary Cardiologist: Rozann Lesches, MD   Chief Complaint  Patient presents with  . Coronary Artery Disease  . Atrial Fibrillation    History of Present Illness: JAHSIAH SIEGLER is a medically complex 75 y.o. male last seen in July 2016. He is here today for a routine follow-up visit. He reports no exertional angina symptoms and NYHA class II dyspnea with typical activities. He uses a cane with chronic knee pain.  Record review finds hospitalization secondary to fall without syncope back in October 2016. He did have electrolyte abnormalities at that time, otherwise no acute findings. Head CT did not reveal any acute events. He has history of mild carotid atherosclerosis, last Doppler study was in 2011.  He is not anticoagulated with history of GI bleeding and AVMs. He has been able to tolerate Plavix.  I reviewed his interval lab work, LDL was 50 in November 2016. He follows regularly with Dr. Nevada Crane.  We discussed his medications which are outlined below. Cardiac regimen includes Coreg, Plavix, Lasix, Imdur, Cozaar, Crestor, and potassium supplements. Blood pressure is low normal today.   Past Medical History  Diagnosis Date  . TIA (transient ischemic attack)     2005  . GERD (gastroesophageal reflux disease)   . Hyperlipidemia   . Essential hypertension, benign   . Depression   . Diabetes mellitus, type 2 ([place])   . Restrictive lung disease   . Candida esophagitis ([place])   . Hemorrhoids   . Arteriovenous malformation small bowel     Capsule study 3/10  . GI bleeding     Cecal ulcers, arteriovenous malformations  . Chronic diarrhea SEP 2011    ?ETIOLOGY-DIABETIC ENTEROPATHY, LACTOSE INTOLERANCE,  OR IBS-MIXED  . Bloating AUG 2009    NL HBT FOR SIBO  . Anemia FEB 2008 FEDA 2o to AVMs, & large colon ulcers    HB 10.7 MCV 74.3; Dr Tressie Stalker  . BPH  (benign prostatic hypertrophy)     Dr [place] Pink  . Glaucoma   . Cataract   . Bilateral carotid artery stenosis     50-75% external left carotid stenosis  . Coronary atherosclerosis of native coronary artery     Multivessel, BMS SVG TO RCA 2000, reportedly  "2" additional stents in interim  . Myocardial infarction ([place])     1994  . COPD (chronic obstructive pulmonary disease) ([place])   . Sleep apnea   . Portal hypertensive gastropathy 11/08/2012    EGD. Dr. Laural Golden  . Dementia     Past Surgical History  Procedure Laterality Date  . Esophagogastroduodenoscopy  SEP 09    DUODENAL LIPOMA  . Capsule endo  03/10  . Right inguinal hernia repair    . Hydrogen breath test  2009  . Colonoscopy  APR 2008    SIMPLE ADENOMA, [place] TICS, IH  . Colonoscopy  FEB 2008 ANEMIA. [facility]     2 LARGE ILEOCEAL ULCERS 2o to ASA, [place] TICS, IH  . Colonoscopy  SEP 2009 TRANSFUSION DEP ANEMIA    AC AVM-ABLATED, [place] TICS, IH  . Upper gastrointestinal endoscopy  SEP 2009    GASTRIC AVM ABLATED, NL DUO Bx  . Cardiac catherization  04/2012  . Cardiac catheterization  04/05/1996 [facility]    EF 45%, occluded SVG to circumflex, patent SVG to D1 and PDA and patent LIMA to LAD with severe native  vessel disease.  . Cardiac catheterization  08/22/1998 [facility]    Mild LM, severe LAD,severe CX, occlude RCA, patient  SVG to diatal RCA, patent vein graft to D1 and patent LIMA to LAD.   Marland Kitchen Coronary angioplasty with stent placement  08/22/1998 [name]    TEC stenting of the SVG to RCA-Last seen by cardiologist in 2010.  Marland Kitchen Coronary artery bypass graft  1995-triple bypass    LIMA-LAD, LIMA to intermediate branch, SVG to PD and second OM,  . Cataract extraction w/phaco Right 09/22/2012    Procedure: CATARACT EXTRACTION PHACO AND INTRAOCULAR LENS PLACEMENT (IOC);  Surgeon: Tonny Branch, MD;  Location: [facility];  Service: Ophthalmology;  Laterality: Right;  CDE: 11.43  . Cataract extraction w/phaco Left 10/17/2012    Procedure:  CATARACT EXTRACTION PHACO AND INTRAOCULAR LENS PLACEMENT (IOC);  Surgeon: Tonny Branch, MD;  Location: [facility];  Service: Ophthalmology;  Laterality: Left;  CDE: 8.06  . Colonoscopy with esophagogastroduodenoscopy (egd) N/A 11/08/2012    Procedure: COLONOSCOPY WITH ESOPHAGOGASTRODUODENOSCOPY (EGD);  Surgeon: Rogene Houston, MD;  Location: [facility];  Service: Endoscopy;  Laterality: N/A;  250-rescheduled to 7:30 Ann to notify pt  . [place] capsule study N/A 11/24/2012    Procedure: GIVENS CAPSULE STUDY;  Surgeon: Rogene Houston, MD;  Location: [facility];  Service: Endoscopy;  Laterality: N/A;  730  . Esophagogastroduodenoscopy N/A 09/27/2013    Procedure: ESOPHAGOGASTRODUODENOSCOPY (EGD) Push enteroscopy;  Surgeon: Rogene Houston, MD;  Location: [facility];  Service: Endoscopy;  Laterality: N/A;  . Hot hemostasis  09/27/2013    Procedure: HOT HEMOSTASIS (ARGON PLASMA COAGULATION/BICAP);  Surgeon: Rogene Houston, MD;  Location: [facility];  Service: Endoscopy;;    Current Outpatient Prescriptions  Medication Sig Dispense Refill  . ADVAIR DISKUS 250-50 MCG/DOSE AEPB Inhale 1 puff into the lungs 2 (two) times daily.     Marland Kitchen albuterol (PROAIR HFA) 108 (90 BASE) MCG/ACT inhaler Inhale 2 puffs into the lungs every 6 (six) hours as needed for wheezing or shortness of breath.    . ALPRAZolam (XANAX) 0.5 MG tablet Take 0.5 mg by mouth 2 (two) times daily as needed for anxiety.     . carvedilol (COREG) 12.5 MG tablet Take 12.5 mg by mouth 2 (two) times daily with a meal.     . cetirizine (ZYRTEC) 10 MG tablet Take 10 mg by mouth daily.    . clopidogrel (PLAVIX) 75 MG tablet Take 1 tablet (75 mg total) by mouth every morning.    . escitalopram (LEXAPRO) 5 MG tablet Take 5 mg by mouth daily.     Marland Kitchen FERROUS GLUCONATE PO Take by mouth as needed.    . furosemide (LASIX) 40 MG tablet Take 40 mg by mouth 2 (two) times daily.     Marland Kitchen gabapentin (NEURONTIN) 100 MG capsule Take 100 mg by mouth at bedtime.     Marland Kitchen  HYDROcodone-acetaminophen (NORCO/VICODIN) 5-325 MG tablet Take 1 tablet by mouth at bedtime as needed for moderate pain.    . isosorbide mononitrate (IMDUR) 60 MG 24 hr tablet Take 1 tablet (60 mg total) by mouth daily. 30 tablet 11  . JANUMET 50-1000 MG per tablet Take 1 tablet by mouth 2 (two) times daily with a meal.     . losartan (COZAAR) 25 MG tablet Take 1 tablet (25 mg total) by mouth daily. 90 tablet 3  . NITROSTAT 0.4 MG SL tablet Place 0.4 mg under the tongue every 5 (five) minutes  as needed for chest pain.     Marland Kitchen omeprazole (PRILOSEC) 20 MG capsule Take 20 mg by mouth daily.     . potassium chloride SA (K-DUR,KLOR-CON) 20 MEQ tablet 20 mEq 2 (two) times daily.     . rosuvastatin (CRESTOR) 20 MG tablet Take 1 tablet (20 mg total) by mouth daily. 90 tablet 0   No current facility-administered medications for this visit.   Allergies:  Penicillins   Social History: The patient  reports that he has been smoking Cigarettes.  He has a 60 pack-year smoking history. He has never used smokeless tobacco. He reports that he uses illicit drugs (Marijuana). He reports that he does not drink alcohol.   ROS:  Please see the history of present illness. Otherwise, complete review of systems is positive for chronic arthritic knee pain, chronic dyspnea exertion.  All other systems are reviewed and negative.   Physical Exam: VS:  BP 100/54 mmHg  Pulse 91  Ht 5\' 5"  (1.651 m)  Wt 181 lb (82.101 kg)  BMI 30.12 kg/m2  SpO2 96%, BMI Body mass index is 30.12 kg/(m^2).  Wt Readings from Last 3 Encounters:  05/27/15 181 lb (82.101 kg)  04/30/15 175 lb 6.4 oz (79.561 kg)  12/26/14 177 lb 9.6 oz (80.559 kg)    Gen: Appears comfortable at rest. Using a cane.  HEENT: Conjunctiva and lids normal, oropharynx with poor dentition.  Neck: Supple, no elevated JVP or bruits, or thyromegaly.  Lungs: Clear to auscultation, nonlabored.  Cardiac: Irregularly irregular, indistinct PMI, soft basal systolic murmur,  no S3 gallop.  Abdomen: Protuberant, obese, bowel sounds present, no tenderness.  Skin: Warm and dry, scattered tattoos noted.  Extremities: Trace ankle edema, distal pulses one plus.  Musculoskeletal: No kyphosis.  Neuropsychiatric: Alert and oriented x3, affect appropriate.  ECG:  I personally reviewed the prior tracing from 12/25/2014 which showed atrial fibrillation with diffuse nonspecific ST-T changes.  Recent Labwork: 06/06/2014: B Natriuretic Peptide 575.0* 12/25/2014: ALT 22; AST 28; Hemoglobin 11.8*; Magnesium 1.9; Platelets 225 12/26/2014: BUN 15; Creatinine, Ser 1.24; Potassium 3.1*; Sodium 140  October 2016: Potassium 4.3, BUN 18, creatinine 1.4 , AST 20, ALT 9, hemoglobin 12.7, platelets 209, cholesterol 111, triglycerides 117, HDL 38, LDL 50, hemoglobin A1c 5.9  Other Studies Reviewed Today:  Lexiscan Cardiolite September 2015: Large inferolateral scar with no clear evidence of ischemia, LVEF calculated at 39%,   Echocardiogram September 2015:  Mild LVH with LVEF 50-55%, increased LVEDP with indeterminate diastolic function, septal dyssynergy, mild to moderate mitral regurgitation, severe left atrial enlargement, mildly reduced RV contraction, moderate tricuspid regurgitation with PASP 63 mmHg, small pericardial effusion.  Assessment and Plan:  1. Chronic atrial fibrillation, heart rate control is adequate on present regimen which includes Coreg. He is not anticoagulated with history of substantial GI bleeding and AVMs.   2. Multivessel CAD with history of prior percutaneous coronary interventions, no active angina symptoms. Ischemic testing from within the last 2 years is noted above, inferolateral scar evident without ischemia. LVEF 50-55% by echocardiogram.  3. Essential hypertension, blood pressure is low normal today. No changes made present regimen.   4. Hyperlipidemia, on Crestor, recent LDL 50.  5. Prior history of TIA and mild carotid atherosclerosis by  previous testing in 2011. Follow-up carotid Dopplers will be obtained.  6. Long-standing history of tobacco abuse. He has not been able to quit.  Current medicines were reviewed with the patient today.  Disposition: FU with me in 6 months.   Signed,  Satira Sark, MD, [facility] 05/27/2015 9:43 AM    [facility], [place], [place] [address] Phone: [phone]; Fax: [phone]

## 2015-05-27 NOTE — Patient Instructions (Signed)
Your physician has requested that you have a carotid duplex. This test is an ultrasound of the carotid arteries in your neck. It looks at blood flow through these arteries that supply the brain with blood. Allow one hour for this exam. There are no restrictions or special instructions. Office will contact with results via phone or letter.   Your physician wants you to follow up in: 6 months.  You will receive a reminder letter in the mail one-two months in advance.  If you don't receive a letter, please call our office to schedule the follow up appointment

## 2015-05-29 DIAGNOSIS — J449 Chronic obstructive pulmonary disease, unspecified: Secondary | ICD-10-CM | POA: Diagnosis not present

## 2015-06-11 ENCOUNTER — Telehealth (INDEPENDENT_AMBULATORY_CARE_PROVIDER_SITE_OTHER): Payer: Self-pay | Admitting: *Deleted

## 2015-06-11 DIAGNOSIS — E782 Mixed hyperlipidemia: Secondary | ICD-10-CM | POA: Diagnosis not present

## 2015-06-11 DIAGNOSIS — E119 Type 2 diabetes mellitus without complications: Secondary | ICD-10-CM | POA: Diagnosis not present

## 2015-06-11 DIAGNOSIS — I10 Essential (primary) hypertension: Secondary | ICD-10-CM | POA: Diagnosis not present

## 2015-06-11 NOTE — Telephone Encounter (Signed)
Hemoccult results noted no change in therapy.

## 2015-06-11 NOTE — Telephone Encounter (Signed)
   Diagnosis:    Result(s)   Card 1: Positive:      Card 2:  Negative:       Completed by: Thomas Hoff , LPN   HEMOCCULT SENSA DEVELOPER: LOT#[id number]:   EXPIRATION DATE: 9/17   HEMOCCULT SENSA CARD:  LOT#:  [id number] EXPIRATION DATE: 07/18   CARD CONTROL RESULTS:  POSITIVE: Positive NEGATIVE: Negative    ADDITIONAL COMMENTS: Forwarded to [place]. Patient has not been called yet.

## 2015-06-12 ENCOUNTER — Ambulatory Visit: Payer: Medicare HMO

## 2015-06-12 DIAGNOSIS — I739 Peripheral vascular disease, unspecified: Principal | ICD-10-CM

## 2015-06-12 DIAGNOSIS — I6523 Occlusion and stenosis of bilateral carotid arteries: Secondary | ICD-10-CM | POA: Diagnosis not present

## 2015-06-12 DIAGNOSIS — I779 Disorder of arteries and arterioles, unspecified: Secondary | ICD-10-CM

## 2015-06-17 ENCOUNTER — Telehealth: Payer: Self-pay | Admitting: *Deleted

## 2015-06-17 DIAGNOSIS — J449 Chronic obstructive pulmonary disease, unspecified: Secondary | ICD-10-CM | POA: Diagnosis not present

## 2015-06-17 DIAGNOSIS — N183 Chronic kidney disease, stage 3 (moderate): Secondary | ICD-10-CM | POA: Diagnosis not present

## 2015-06-17 DIAGNOSIS — R5382 Chronic fatigue, unspecified: Secondary | ICD-10-CM | POA: Diagnosis not present

## 2015-06-17 DIAGNOSIS — E1122 Type 2 diabetes mellitus with diabetic chronic kidney disease: Secondary | ICD-10-CM | POA: Diagnosis not present

## 2015-06-17 NOTE — Telephone Encounter (Signed)
Patient informed. 

## 2015-06-17 NOTE — Telephone Encounter (Signed)
-----   Message from Satira Sark, MD sent at 06/15/2015  5:59 PM EDT ----- Reviewed report. Only 1-39% bilateral ICA stenoses. No change in plan for now.

## 2015-06-27 DIAGNOSIS — J449 Chronic obstructive pulmonary disease, unspecified: Secondary | ICD-10-CM | POA: Diagnosis not present

## 2015-06-29 DIAGNOSIS — J449 Chronic obstructive pulmonary disease, unspecified: Secondary | ICD-10-CM | POA: Diagnosis not present

## 2015-07-27 ENCOUNTER — Emergency Department (HOSPITAL_COMMUNITY): Payer: Medicare HMO

## 2015-07-27 ENCOUNTER — Encounter (HOSPITAL_COMMUNITY): Payer: Self-pay

## 2015-07-27 ENCOUNTER — Inpatient Hospital Stay (HOSPITAL_COMMUNITY)
Admission: EM | Admit: 2015-07-27 | Discharge: 2015-08-02 | DRG: 683 | Disposition: A | Discharge status: Home / Self Care | Payer: Medicare HMO | Attending: Internal Medicine | Admitting: Internal Medicine

## 2015-07-27 DIAGNOSIS — I252 Old myocardial infarction: Secondary | ICD-10-CM

## 2015-07-27 DIAGNOSIS — N183 Chronic kidney disease, stage 3 unspecified: Secondary | ICD-10-CM

## 2015-07-27 DIAGNOSIS — D72829 Elevated white blood cell count, unspecified: Secondary | ICD-10-CM

## 2015-07-27 DIAGNOSIS — J441 Chronic obstructive pulmonary disease with (acute) exacerbation: Secondary | ICD-10-CM

## 2015-07-27 DIAGNOSIS — I481 Persistent atrial fibrillation: Secondary | ICD-10-CM | POA: Diagnosis not present

## 2015-07-27 DIAGNOSIS — B9689 Other specified bacterial agents as the cause of diseases classified elsewhere: Secondary | ICD-10-CM | POA: Diagnosis not present

## 2015-07-27 DIAGNOSIS — R0602 Shortness of breath: Secondary | ICD-10-CM | POA: Diagnosis not present

## 2015-07-27 DIAGNOSIS — F0390 Unspecified dementia without behavioral disturbance: Secondary | ICD-10-CM | POA: Diagnosis present

## 2015-07-27 DIAGNOSIS — R69 Illness, unspecified: Secondary | ICD-10-CM | POA: Diagnosis not present

## 2015-07-27 DIAGNOSIS — I4891 Unspecified atrial fibrillation: Secondary | ICD-10-CM | POA: Diagnosis not present

## 2015-07-27 DIAGNOSIS — E782 Mixed hyperlipidemia: Secondary | ICD-10-CM | POA: Diagnosis present

## 2015-07-27 DIAGNOSIS — H409 Unspecified glaucoma: Secondary | ICD-10-CM | POA: Diagnosis present

## 2015-07-27 DIAGNOSIS — Z955 Presence of coronary angioplasty implant and graft: Secondary | ICD-10-CM

## 2015-07-27 DIAGNOSIS — Z9981 Dependence on supplemental oxygen: Secondary | ICD-10-CM | POA: Diagnosis not present

## 2015-07-27 DIAGNOSIS — E1121 Type 2 diabetes mellitus with diabetic nephropathy: Secondary | ICD-10-CM | POA: Diagnosis not present

## 2015-07-27 DIAGNOSIS — R7881 Bacteremia: Secondary | ICD-10-CM | POA: Diagnosis not present

## 2015-07-27 DIAGNOSIS — K219 Gastro-esophageal reflux disease without esophagitis: Secondary | ICD-10-CM | POA: Diagnosis present

## 2015-07-27 DIAGNOSIS — J449 Chronic obstructive pulmonary disease, unspecified: Secondary | ICD-10-CM | POA: Diagnosis not present

## 2015-07-27 DIAGNOSIS — R000 Tachycardia, unspecified: Secondary | ICD-10-CM | POA: Diagnosis not present

## 2015-07-27 DIAGNOSIS — N179 Acute kidney failure, unspecified: Principal | ICD-10-CM | POA: Diagnosis present

## 2015-07-27 DIAGNOSIS — I2510 Atherosclerotic heart disease of native coronary artery without angina pectoris: Secondary | ICD-10-CM | POA: Diagnosis present

## 2015-07-27 DIAGNOSIS — E1122 Type 2 diabetes mellitus with diabetic chronic kidney disease: Secondary | ICD-10-CM | POA: Diagnosis present

## 2015-07-27 DIAGNOSIS — Z23 Encounter for immunization: Secondary | ICD-10-CM

## 2015-07-27 DIAGNOSIS — Z8249 Family history of ischemic heart disease and other diseases of the circulatory system: Secondary | ICD-10-CM

## 2015-07-27 DIAGNOSIS — E119 Type 2 diabetes mellitus without complications: Secondary | ICD-10-CM

## 2015-07-27 DIAGNOSIS — Z8673 Personal history of transient ischemic attack (TIA), and cerebral infarction without residual deficits: Secondary | ICD-10-CM | POA: Diagnosis not present

## 2015-07-27 DIAGNOSIS — F17210 Nicotine dependence, cigarettes, uncomplicated: Secondary | ICD-10-CM | POA: Diagnosis present

## 2015-07-27 DIAGNOSIS — F17200 Nicotine dependence, unspecified, uncomplicated: Secondary | ICD-10-CM | POA: Diagnosis present

## 2015-07-27 DIAGNOSIS — Z7984 Long term (current) use of oral hypoglycemic drugs: Secondary | ICD-10-CM

## 2015-07-27 DIAGNOSIS — Z951 Presence of aortocoronary bypass graft: Secondary | ICD-10-CM | POA: Diagnosis not present

## 2015-07-27 DIAGNOSIS — I129 Hypertensive chronic kidney disease with stage 1 through stage 4 chronic kidney disease, or unspecified chronic kidney disease: Secondary | ICD-10-CM | POA: Diagnosis present

## 2015-07-27 DIAGNOSIS — I4819 Other persistent atrial fibrillation: Secondary | ICD-10-CM | POA: Diagnosis present

## 2015-07-27 DIAGNOSIS — I10 Essential (primary) hypertension: Secondary | ICD-10-CM | POA: Diagnosis not present

## 2015-07-27 LAB — CBC WITH DIFFERENTIAL/PLATELET
Basophils Absolute: 0 10*3/uL (ref 0.0–0.1)
Basophils Relative: 0 %
EOS PCT: 0 %
Eosinophils Absolute: 0 10*3/uL (ref 0.0–0.7)
HCT: 36.4 % — ABNORMAL LOW (ref 39.0–52.0)
HEMOGLOBIN: 12.3 g/dL — AB (ref 13.0–17.0)
LYMPHS PCT: 7 %
Lymphs Abs: 1 10*3/uL (ref 0.7–4.0)
MCH: 32.1 pg (ref 26.0–34.0)
MCHC: 33.8 g/dL (ref 30.0–36.0)
MCV: 95 fL (ref 78.0–100.0)
MONO ABS: 1.7 10*3/uL — AB (ref 0.1–1.0)
Monocytes Relative: 12 %
NEUTROS PCT: 81 %
Neutro Abs: 11.1 10*3/uL — ABNORMAL HIGH (ref 1.7–7.7)
PLATELETS: 165 10*3/uL (ref 150–400)
RBC: 3.83 MIL/uL — AB (ref 4.22–5.81)
RDW: 13.2 % (ref 11.5–15.5)
WBC MORPHOLOGY: INCREASED
WBC: 13.8 10*3/uL — AB (ref 4.0–10.5)

## 2015-07-27 LAB — URINALYSIS, ROUTINE W REFLEX MICROSCOPIC
Bilirubin Urine: NEGATIVE
GLUCOSE, UA: NEGATIVE mg/dL
Ketones, ur: NEGATIVE mg/dL
LEUKOCYTES UA: NEGATIVE
Nitrite: NEGATIVE
PH: 6 (ref 5.0–8.0)
Protein, ur: 100 mg/dL — AB
Specific Gravity, Urine: 1.01 (ref 1.005–1.030)

## 2015-07-27 LAB — COMPREHENSIVE METABOLIC PANEL
ALT: 11 U/L — AB (ref 17–63)
AST: 23 U/L (ref 15–41)
Albumin: 4.2 g/dL (ref 3.5–5.0)
Alkaline Phosphatase: 89 U/L (ref 38–126)
Anion gap: 13 (ref 5–15)
BUN: 40 mg/dL — ABNORMAL HIGH (ref 6–20)
CHLORIDE: 101 mmol/L (ref 101–111)
CO2: 23 mmol/L (ref 22–32)
CREATININE: 3.65 mg/dL — AB (ref 0.61–1.24)
Calcium: 9.5 mg/dL (ref 8.9–10.3)
GFR calc Af Amer: 18 mL/min — ABNORMAL LOW (ref >60)
GFR, EST NON AFRICAN AMERICAN: 15 mL/min — AB (ref >60)
Glucose, Bld: 153 mg/dL — ABNORMAL HIGH (ref 65–99)
Potassium: 4.6 mmol/L (ref 3.5–5.1)
SODIUM: 137 mmol/L (ref 135–145)
Total Bilirubin: 1 mg/dL (ref 0.3–1.2)
Total Protein: 8.1 g/dL (ref 6.5–8.1)

## 2015-07-27 LAB — I-STAT TROPONIN, ED: Troponin i, poc: 0.02 ng/mL (ref 0.00–0.08)

## 2015-07-27 LAB — GLUCOSE, CAPILLARY: GLUCOSE-CAPILLARY: 264 mg/dL — AB (ref 65–99)

## 2015-07-27 LAB — URINE MICROSCOPIC-ADD ON
Squamous Epithelial / LPF: NONE SEEN
WBC, UA: NONE SEEN WBC/hpf (ref 0–5)

## 2015-07-27 LAB — BRAIN NATRIURETIC PEPTIDE: B Natriuretic Peptide: 409 pg/mL — ABNORMAL HIGH (ref 0.0–100.0)

## 2015-07-27 LAB — I-STAT CG4 LACTIC ACID, ED: Lactic Acid, Venous: 2.1 mmol/L (ref 0.5–2.0)

## 2015-07-27 MED ORDER — ENOXAPARIN SODIUM 40 MG/0.4ML ~~LOC~~ SOLN: 40.0000 mg | SUBCUTANEOUS | Status: DC

## 2015-07-27 MED ORDER — ISOSORBIDE MONONITRATE ER 60 MG PO TB24
60.0000 mg | ORAL_TABLET | Freq: Every day | ORAL | Status: DC
  Administered 2015-07-27 – 2015-08-02 (×7): 60 mg via ORAL
  Filled 2015-07-27 (×7): qty 1

## 2015-07-27 MED ORDER — ACETAMINOPHEN 650 MG RE SUPP: 650.0000 mg | Freq: Four times a day (QID) | RECTAL | Status: DC | PRN

## 2015-07-27 MED ORDER — INSULIN ASPART 100 UNIT/ML ~~LOC~~ SOLN
0.0000 [IU] | Freq: Every day | SUBCUTANEOUS | Status: DC
  Administered 2015-07-27: 3 [IU] via SUBCUTANEOUS

## 2015-07-27 MED ORDER — ROSUVASTATIN CALCIUM 20 MG PO TABS
20.0000 mg | ORAL_TABLET | Freq: Every day | ORAL | Status: DC
  Administered 2015-07-27 – 2015-08-02 (×7): 20 mg via ORAL
  Filled 2015-07-27 (×7): qty 1

## 2015-07-27 MED ORDER — CLOPIDOGREL BISULFATE 75 MG PO TABS
75.0000 mg | ORAL_TABLET | Freq: Every morning | ORAL | Status: DC
  Administered 2015-07-28 – 2015-08-02 (×6): 75 mg via ORAL
  Filled 2015-07-27 (×6): qty 1

## 2015-07-27 MED ORDER — FERROUS SULFATE 325 (65 FE) MG PO TABS
325.0000 mg | ORAL_TABLET | Freq: Every day | ORAL | Status: DC
  Administered 2015-07-28 – 2015-08-02 (×6): 325 mg via ORAL
  Filled 2015-07-27 (×6): qty 1

## 2015-07-27 MED ORDER — POTASSIUM CHLORIDE CRYS ER 20 MEQ PO TBCR
20.0000 meq | EXTENDED_RELEASE_TABLET | Freq: Two times a day (BID) | ORAL | Status: DC
  Administered 2015-07-27 – 2015-07-31 (×8): 20 meq via ORAL
  Filled 2015-07-27 (×8): qty 1

## 2015-07-27 MED ORDER — ENOXAPARIN SODIUM 30 MG/0.3ML ~~LOC~~ SOLN
30.0000 mg | SUBCUTANEOUS | Status: DC
  Administered 2015-07-27 – 2015-08-01 (×6): 30 mg via SUBCUTANEOUS
  Filled 2015-07-27 (×6): qty 0.3

## 2015-07-27 MED ORDER — ALPRAZOLAM 0.5 MG PO TABS
0.5000 mg | ORAL_TABLET | Freq: Two times a day (BID) | ORAL | Status: DC | PRN
  Administered 2015-08-01: 0.5 mg via ORAL
  Filled 2015-07-27: qty 1

## 2015-07-27 MED ORDER — SENNOSIDES-DOCUSATE SODIUM 8.6-50 MG PO TABS: 1.0000 | ORAL_TABLET | Freq: Every evening | ORAL | Status: DC | PRN

## 2015-07-27 MED ORDER — METHYLPREDNISOLONE SODIUM SUCC 125 MG IJ SOLR
60.0000 mg | Freq: Two times a day (BID) | INTRAMUSCULAR | Status: DC
  Administered 2015-07-27 – 2015-07-31 (×8): 60 mg via INTRAVENOUS
  Filled 2015-07-27 (×8): qty 2

## 2015-07-27 MED ORDER — SODIUM CHLORIDE 0.9 % IV BOLUS (SEPSIS)
1000.0000 mL | Freq: Once | INTRAVENOUS | Status: AC
  Administered 2015-07-27: 1000 mL via INTRAVENOUS

## 2015-07-27 MED ORDER — ALBUTEROL SULFATE (2.5 MG/3ML) 0.083% IN NEBU
2.5000 mg | INHALATION_SOLUTION | RESPIRATORY_TRACT | Status: DC | PRN
  Administered 2015-07-27 – 2015-08-01 (×2): 2.5 mg via RESPIRATORY_TRACT
  Filled 2015-07-27 (×2): qty 3

## 2015-07-27 MED ORDER — CITALOPRAM HYDROBROMIDE 20 MG PO TABS
20.0000 mg | ORAL_TABLET | Freq: Every day | ORAL | Status: DC
  Administered 2015-07-27 – 2015-08-02 (×7): 20 mg via ORAL
  Filled 2015-07-27 (×7): qty 1

## 2015-07-27 MED ORDER — ONDANSETRON HCL 4 MG PO TABS: 4.0000 mg | ORAL_TABLET | Freq: Four times a day (QID) | ORAL | Status: DC | PRN

## 2015-07-27 MED ORDER — INSULIN ASPART 100 UNIT/ML ~~LOC~~ SOLN
0.0000 [IU] | Freq: Three times a day (TID) | SUBCUTANEOUS | Status: DC
  Administered 2015-07-28: 3 [IU] via SUBCUTANEOUS
  Administered 2015-07-28: 5 [IU] via SUBCUTANEOUS
  Administered 2015-07-28 – 2015-07-29 (×2): 3 [IU] via SUBCUTANEOUS
  Administered 2015-07-29: 5 [IU] via SUBCUTANEOUS
  Administered 2015-07-29: 3 [IU] via SUBCUTANEOUS
  Administered 2015-07-30: 2 [IU] via SUBCUTANEOUS
  Administered 2015-07-30 (×2): 3 [IU] via SUBCUTANEOUS
  Administered 2015-07-31: 5 [IU] via SUBCUTANEOUS
  Administered 2015-07-31: 3 [IU] via SUBCUTANEOUS
  Administered 2015-08-01: 2 [IU] via SUBCUTANEOUS
  Administered 2015-08-01: 3 [IU] via SUBCUTANEOUS
  Administered 2015-08-01 – 2015-08-02 (×2): 2 [IU] via SUBCUTANEOUS

## 2015-07-27 MED ORDER — NITROGLYCERIN 0.4 MG SL SUBL: 0.4000 mg | SUBLINGUAL_TABLET | SUBLINGUAL | Status: DC | PRN

## 2015-07-27 MED ORDER — GABAPENTIN 100 MG PO CAPS
100.0000 mg | ORAL_CAPSULE | Freq: Every day | ORAL | Status: DC
  Administered 2015-07-27 – 2015-08-01 (×6): 100 mg via ORAL
  Filled 2015-07-27 (×6): qty 1

## 2015-07-27 MED ORDER — LORATADINE 10 MG PO TABS
10.0000 mg | ORAL_TABLET | Freq: Every day | ORAL | Status: DC
  Administered 2015-07-27 – 2015-08-02 (×7): 10 mg via ORAL
  Filled 2015-07-27 (×7): qty 1

## 2015-07-27 MED ORDER — ACETAMINOPHEN 325 MG PO TABS
650.0000 mg | ORAL_TABLET | Freq: Four times a day (QID) | ORAL | Status: DC | PRN
  Administered 2015-08-01: 650 mg via ORAL
  Filled 2015-07-27: qty 2

## 2015-07-27 MED ORDER — IPRATROPIUM-ALBUTEROL 0.5-2.5 (3) MG/3ML IN SOLN
3.0000 mL | Freq: Once | RESPIRATORY_TRACT | Status: AC
  Administered 2015-07-27: 3 mL via RESPIRATORY_TRACT
  Filled 2015-07-27: qty 3

## 2015-07-27 MED ORDER — LEVOFLOXACIN 500 MG PO TABS
500.0000 mg | ORAL_TABLET | ORAL | Status: DC
  Administered 2015-07-27 – 2015-07-31 (×3): 500 mg via ORAL
  Filled 2015-07-27 (×3): qty 1

## 2015-07-27 MED ORDER — METHYLPREDNISOLONE SODIUM SUCC 125 MG IJ SOLR
125.0000 mg | Freq: Once | INTRAMUSCULAR | Status: AC
  Administered 2015-07-27: 125 mg via INTRAVENOUS
  Filled 2015-07-27: qty 2

## 2015-07-27 MED ORDER — PANTOPRAZOLE SODIUM 40 MG PO TBEC
40.0000 mg | DELAYED_RELEASE_TABLET | Freq: Every day | ORAL | Status: DC
  Administered 2015-07-27 – 2015-08-02 (×7): 40 mg via ORAL
  Filled 2015-07-27 (×7): qty 1

## 2015-07-27 MED ORDER — MOMETASONE FURO-FORMOTEROL FUM 200-5 MCG/ACT IN AERO
2.0000 | INHALATION_SPRAY | Freq: Two times a day (BID) | RESPIRATORY_TRACT | Status: DC
  Administered 2015-07-28 – 2015-08-02 (×10): 2 via RESPIRATORY_TRACT
  Filled 2015-07-27: qty 8.8

## 2015-07-27 MED ORDER — IPRATROPIUM-ALBUTEROL 0.5-2.5 (3) MG/3ML IN SOLN
3.0000 mL | Freq: Three times a day (TID) | RESPIRATORY_TRACT | Status: DC
Start: 2015-07-28 — End: 2015-08-02
  Administered 2015-07-28 – 2015-08-02 (×16): 3 mL via RESPIRATORY_TRACT
  Filled 2015-07-27 (×16): qty 3

## 2015-07-27 MED ORDER — ONDANSETRON HCL 4 MG/2ML IJ SOLN: 4.0000 mg | Freq: Four times a day (QID) | INTRAMUSCULAR | Status: DC | PRN

## 2015-07-27 MED ORDER — IPRATROPIUM BROMIDE 0.02 % IN SOLN
0.5000 mg | Freq: Four times a day (QID) | RESPIRATORY_TRACT | Status: DC
  Administered 2015-07-27: 0.5 mg via RESPIRATORY_TRACT
  Filled 2015-07-27: qty 2.5

## 2015-07-27 MED ORDER — SODIUM CHLORIDE 0.9 % IV SOLN
INTRAVENOUS | Status: DC
  Administered 2015-07-27 – 2015-08-02 (×9): via INTRAVENOUS

## 2015-07-27 MED ORDER — CARVEDILOL 12.5 MG PO TABS
12.5000 mg | ORAL_TABLET | Freq: Two times a day (BID) | ORAL | Status: DC
  Administered 2015-07-27 – 2015-08-02 (×12): 12.5 mg via ORAL
  Filled 2015-07-27 (×12): qty 1

## 2015-07-27 MED ORDER — PNEUMOCOCCAL VAC POLYVALENT 25 MCG/0.5ML IJ INJ: 0.5000 mL | INJECTION | INTRAMUSCULAR | Status: DC

## 2015-07-27 MED ORDER — INSULIN ASPART 100 UNIT/ML ~~LOC~~ SOLN
4.0000 [IU] | Freq: Three times a day (TID) | SUBCUTANEOUS | Status: DC
Start: 2015-07-28 — End: 2015-08-02
  Administered 2015-07-28 – 2015-08-02 (×14): 4 [IU] via SUBCUTANEOUS

## 2015-07-27 NOTE — H&P (Signed)
History and Physical    Donald Gaines [id number] DOB: 03/16/1941 DOA: 07/27/2015  Referring MD/NP/PA: Julianne Rice, M.D. PCP: Wende Neighbors, MD  Patient coming from: Home  Chief Complaint: Cough, shortness of breath  HPI: Donald Gaines is a 75 y.o. male with history of persistent atrial fibrillation, hypertension, hyperlipidemia, type 2 diabetes, GERD, COPD who presents to the hospital today with the above complaints. He states his shortness of breath began about 48 hours ago and has progressively gotten worse. He feels like he may have an upper respiratory infection with runny nose and sore throat and lots of nonproductive cough. He states he has had intermittent fevers but is unable to quantify his temperature and he is not febrile upon admission. He is also noted to have acute renal failure with a creatinine of 3.65, a white blood cell count of 13.8, chest x-ray without edema or consolidation. We have been asked to admit him for further evaluation and management.   Past Medical History  Diagnosis Date  . TIA (transient ischemic attack)     2005  . GERD (gastroesophageal reflux disease)   . Hyperlipidemia   . Essential hypertension, benign   . Depression   . Diabetes mellitus, type 2 ([place])   . Restrictive lung disease   . Candida esophagitis ([place])   . Hemorrhoids   . Arteriovenous malformation small bowel     Capsule study 3/10  . GI bleeding     Cecal ulcers, arteriovenous malformations  . Chronic diarrhea SEP 2011    ?ETIOLOGY-DIABETIC ENTEROPATHY, LACTOSE INTOLERANCE,  OR IBS-MIXED  . Bloating AUG 2009    NL HBT FOR SIBO  . Anemia FEB 2008 FEDA 2o to AVMs, & large colon ulcers    HB 10.7 MCV 74.3; Dr Tressie Stalker  . BPH (benign prostatic hypertrophy)     Dr [place] Pink  . Glaucoma   . Cataract   . Bilateral carotid artery stenosis     50-75% external left carotid stenosis  . Coronary atherosclerosis of native coronary artery     Multivessel, BMS SVG TO RCA 2000,  reportedly  "2" additional stents in interim  . Myocardial infarction ([place])     1994  . COPD (chronic obstructive pulmonary disease) ([place])   . Sleep apnea   . Portal hypertensive gastropathy 11/08/2012    EGD. Dr. Laural Golden  . Dementia     Past Surgical History  Procedure Laterality Date  . Esophagogastroduodenoscopy  SEP 09    DUODENAL LIPOMA  . Capsule endo  03/10  . Right inguinal hernia repair    . Hydrogen breath test  2009  . Colonoscopy  APR 2008    SIMPLE ADENOMA, [place] TICS, IH  . Colonoscopy  FEB 2008 ANEMIA. MELENA     2 LARGE ILEOCEAL ULCERS 2o to ASA, [place] TICS, IH  . Colonoscopy  SEP 2009 TRANSFUSION DEP ANEMIA    AC AVM-ABLATED, [place] TICS, IH  . Upper gastrointestinal endoscopy  SEP 2009    GASTRIC AVM ABLATED, NL DUO Bx  . Cardiac catherization  04/2012  . Cardiac catheterization  04/05/1996 [facility]    EF 45%, occluded SVG to circumflex, patent SVG to D1 and PDA and patent LIMA to LAD with severe native vessel disease.  . Cardiac catheterization  08/22/1998 [facility]    Mild LM, severe LAD,severe CX, occlude RCA, patient  SVG to diatal RCA, patent vein graft to D1 and patent LIMA to LAD.   Marland Kitchen Coronary angioplasty with stent placement  08/22/1998 [name]    TEC stenting of the SVG to RCA-Last seen by cardiologist in 2010.  Marland Kitchen Coronary artery bypass graft  1995-triple bypass    LIMA-LAD, LIMA to intermediate branch, SVG to PD and second OM,  . Cataract extraction w/phaco Right 09/22/2012    Procedure: CATARACT EXTRACTION PHACO AND INTRAOCULAR LENS PLACEMENT (IOC);  Surgeon: Tonny Branch, MD;  Location: [facility];  Service: Ophthalmology;  Laterality: Right;  CDE: 11.43  . Cataract extraction w/phaco Left 10/17/2012    Procedure: CATARACT EXTRACTION PHACO AND INTRAOCULAR LENS PLACEMENT (IOC);  Surgeon: Tonny Branch, MD;  Location: [facility];  Service: Ophthalmology;  Laterality: Left;  CDE: 8.06  . Colonoscopy with esophagogastroduodenoscopy (egd) N/A 11/08/2012    Procedure:  COLONOSCOPY WITH ESOPHAGOGASTRODUODENOSCOPY (EGD);  Surgeon: Rogene Houston, MD;  Location: [facility];  Service: Endoscopy;  Laterality: N/A;  250-rescheduled to 7:30 Ann to notify pt  . [place] capsule study N/A 11/24/2012    Procedure: GIVENS CAPSULE STUDY;  Surgeon: Rogene Houston, MD;  Location: [facility];  Service: Endoscopy;  Laterality: N/A;  730  . Esophagogastroduodenoscopy N/A 09/27/2013    Procedure: ESOPHAGOGASTRODUODENOSCOPY (EGD) Push enteroscopy;  Surgeon: Rogene Houston, MD;  Location: [facility];  Service: Endoscopy;  Laterality: N/A;  . Hot hemostasis  09/27/2013    Procedure: HOT HEMOSTASIS (ARGON PLASMA COAGULATION/BICAP);  Surgeon: Rogene Houston, MD;  Location: [facility];  Service: Endoscopy;;     reports that he has been smoking Cigarettes.  He has a 60 pack-year smoking history. He has never used smokeless tobacco. He reports that he uses illicit drugs (Marijuana). He reports that he does not drink alcohol.  Allergies  Allergen Reactions  . Penicillins Rash    Has patient had a PCN reaction causing immediate rash, facial/tongue/throat swelling, SOB or lightheadedness with hypotension: Yes Has patient had a PCN reaction causing severe rash involving mucus membranes or skin necrosis: No Has patient had a PCN reaction that required hospitalization No Has patient had a PCN reaction occurring within the last 10 years: No If all of the above answers are "NO", then may proceed with Cephalosporin use.     Family History  Problem Relation Age of Onset  . Heart attack Mother   . Heart disease Mother   . Hypertension Mother     Prior to Admission medications   Medication Sig Start Date End Date Taking? Authorizing Provider  ADVAIR DISKUS 250-50 MCG/DOSE AEPB Inhale 1 puff into the lungs 2 (two) times daily.  10/02/10  Yes Historical Provider, MD  albuterol (PROAIR HFA) 108 (90 BASE) MCG/ACT inhaler Inhale 2 puffs into the lungs every 6 (six) hours as needed for  wheezing or shortness of breath.   Yes Historical Provider, MD  ALPRAZolam Duanne Moron) 0.5 MG tablet Take 0.5 mg by mouth 2 (two) times daily as needed for anxiety.    Yes Historical Provider, MD  carvedilol (COREG) 12.5 MG tablet Take 12.5 mg by mouth 2 (two) times daily with a meal.  03/26/14  Yes Historical Provider, MD  cetirizine (ZYRTEC) 10 MG tablet Take 10 mg by mouth daily.   Yes Historical Provider, MD  clopidogrel (PLAVIX) 75 MG tablet Take 1 tablet (75 mg total) by mouth every morning. 09/27/13  Yes Nita Sells, MD  escitalopram (LEXAPRO) 5 MG tablet Take 5 mg by mouth daily.  04/16/15  Yes Historical Provider, MD  ferrous sulfate 325 (65 FE) MG tablet Take 325 mg by mouth daily  with breakfast.   Yes Historical Provider, MD  furosemide (LASIX) 40 MG tablet Take 40 mg by mouth 2 (two) times daily.    Yes Historical Provider, MD  gabapentin (NEURONTIN) 100 MG capsule Take 100 mg by mouth at bedtime.  09/20/14  Yes Historical Provider, MD  isosorbide mononitrate (IMDUR) 60 MG 24 hr tablet Take 1 tablet (60 mg total) by mouth daily. 06/01/13  Yes Lendon Colonel, NP  JANUMET 50-1000 MG per tablet Take 1 tablet by mouth 2 (two) times daily with a meal.  07/22/13  Yes Historical Provider, MD  losartan (COZAAR) 25 MG tablet Take 1 tablet (25 mg total) by mouth daily. 12/04/13  Yes Satira Sark, MD  NITROSTAT 0.4 MG SL tablet Place 0.4 mg under the tongue every 5 (five) minutes as needed for chest pain.  05/23/10  Yes Historical Provider, MD  omeprazole (PRILOSEC) 20 MG capsule Take 20 mg by mouth daily.  08/28/13  Yes Historical Provider, MD  potassium chloride SA (K-DUR,KLOR-CON) 20 MEQ tablet Take 20 mEq by mouth 2 (two) times daily.  04/16/15  Yes Historical Provider, MD  rosuvastatin (CRESTOR) 20 MG tablet Take 1 tablet (20 mg total) by mouth daily. 12/12/13  Yes Satira Sark, MD    Review of Systems: Constitutional: Denies diaphoresis, appetite change and fatigue.  HEENT: Denies  photophobia, eye pain, redness, hearing loss, ear pain, congestion, sore throat, rhinorrhea, sneezing, mouth sores, trouble swallowing, neck pain, neck stiffness and tinnitus.   Respiratory: Denies SOB, DOE, cough,  and wheezing.   Cardiovascular: Denies chest pain, palpitations and leg swelling.  Gastrointestinal: Denies nausea, vomiting, abdominal pain, diarrhea, constipation, blood in stool and abdominal distention.  Genitourinary: Denies dysuria, urgency, frequency, hematuria, flank pain and difficulty urinating.  Endocrine: Denies: hot or cold intolerance, sweats, changes in hair or nails, polyuria, polydipsia. Musculoskeletal: Denies myalgias, back pain, joint swelling, arthralgias and gait problem.  Skin: Denies pallor, rash and wound.  Neurological: Denies dizziness, seizures, syncope, weakness, light-headedness, numbness and headaches.  Hematological: Denies adenopathy. Easy bruising, personal or family bleeding history  Psychiatric/Behavioral: Denies suicidal ideation, mood changes, confusion, nervousness, sleep disturbance and agitation    Physical Exam: Filed Vitals:   07/27/15 1200 07/27/15 1230 07/27/15 1300 07/27/15 1352  BP: 91/63  102/64 111/60  Pulse: 89 94 89 84  Temp:    98 F (36.7 C)  TempSrc:    Oral  Resp: 24 25 23    Height:    5\' 5"  (1.651 m)  Weight:    77.4 kg (170 lb 10.2 oz)  SpO2: 91% 93% 97% 97%     Constitutional: NAD, calm, comfortable Eyes: PERRL, lids and conjunctivae normal ENMT: Mucous membranes are moist. Posterior pharynx clear of any exudate or lesions.Normal dentition.  Neck: normal, supple, no masses, no thyromegaly Respiratory: clear to auscultation bilaterally, no wheezing, no crackles. Normal respiratory effort. No accessory muscle use.  Cardiovascular: Regular rate and rhythm, no murmurs / rubs / gallops. No extremity edema. 2+ pedal pulses. No carotid bruits.  Abdomen: no tenderness, no masses palpated. No hepatosplenomegaly. Bowel  sounds positive.  Musculoskeletal: no clubbing / cyanosis. No joint deformity upper and lower extremities. Good ROM, no contractures. Normal muscle tone.  Skin: no rashes, lesions, ulcers. No induration Neurologic: CN 2-12 grossly intact. Sensation intact, DTR normal. Strength 5/5 in all 4.  Psychiatric: Normal judgment and insight. Alert and oriented x 3. Normal mood.    Labs on Admission: I have personally reviewed following labs and  imaging studies  CBC:  Recent Labs Lab 07/27/15 0940  WBC 13.8*  NEUTROABS 11.1*  HGB 12.3*  HCT 36.4*  MCV 95.0  PLT 123XX123   Basic Metabolic Panel:  Recent Labs Lab 07/27/15 0940  NA 137  K 4.6  CL 101  CO2 23  GLUCOSE 153*  BUN 40*  CREATININE 3.65*  CALCIUM 9.5   GFR: Estimated Creatinine Clearance: 17.6 mL/min (by C-G formula based on Cr of 3.65). Liver Function Tests:  Recent Labs Lab 07/27/15 0940  AST 23  ALT 11*  ALKPHOS 89  BILITOT 1.0  PROT 8.1  ALBUMIN 4.2   No results for input(s): LIPASE, AMYLASE in the last 168 hours. No results for input(s): AMMONIA in the last 168 hours. Coagulation Profile: No results for input(s): INR, PROTIME in the last 168 hours. Cardiac Enzymes: No results for input(s): CKTOTAL, CKMB, CKMBINDEX, TROPONINI in the last 168 hours. BNP (last 3 results) No results for input(s): PROBNP in the last 8760 hours. HbA1C: No results for input(s): HGBA1C in the last 72 hours. CBG: No results for input(s): GLUCAP in the last 168 hours. Lipid Profile: No results for input(s): CHOL, HDL, LDLCALC, TRIG, CHOLHDL, LDLDIRECT in the last 72 hours. Thyroid Function Tests: No results for input(s): TSH, T4TOTAL, FREET4, T3FREE, THYROIDAB in the last 72 hours. Anemia Panel: No results for input(s): VITAMINB12, FOLATE, FERRITIN, TIBC, IRON, RETICCTPCT in the last 72 hours. Urine analysis:    Component Value Date/Time   COLORURINE YELLOW 07/27/2015 1020   APPEARANCEUR CLEAR 07/27/2015 1020   LABSPEC  1.010 07/27/2015 1020   PHURINE 6.0 07/27/2015 1020   GLUCOSEU NEGATIVE 07/27/2015 1020   HGBUR MODERATE* 07/27/2015 1020   BILIRUBINUR NEGATIVE 07/27/2015 1020   KETONESUR NEGATIVE 07/27/2015 1020   PROTEINUR 100* 07/27/2015 1020   UROBILINOGEN 1.0 12/25/2014 2210   NITRITE NEGATIVE 07/27/2015 1020   LEUKOCYTESUR NEGATIVE 07/27/2015 1020   Sepsis Labs: @LABRCNTIP (procalcitonin:4,lacticidven:4) ) Recent Results (from the past 240 hour(s))  Culture, blood (Routine X 2) w Reflex to ID Panel     Status: None (Preliminary result)   Collection Time: 07/27/15 10:20 AM  Result Value Ref Range Status   Specimen Description BLOOD LEFT HAND  Final   Special Requests BOTTLES DRAWN AEROBIC AND ANAEROBIC 6CC  Final   Culture PENDING  Incomplete   Report Status PENDING  Incomplete  Culture, blood (Routine X 2) w Reflex to ID Panel     Status: None (Preliminary result)   Collection Time: 07/27/15 10:25 AM  Result Value Ref Range Status   Specimen Description BLOOD RIGHT HAND  Final   Special Requests BOTTLES DRAWN AEROBIC AND ANAEROBIC [facility]  Final   Culture PENDING  Incomplete   Report Status PENDING  Incomplete     Radiological Exams on Admission: Dg Chest 2 View  07/27/2015  CLINICAL DATA:  Tachycardia and shortness of breath EXAM: CHEST  2 VIEW COMPARISON:  December 25, 2014 FINDINGS: Lungs are clear. The heart is borderline enlarged with pulmonary vascularity within normal limits. Patient is status post internal mammary bypass grafting. No adenopathy. There is degenerative change in the thoracic spine and shoulder regions. IMPRESSION: Stable cardiac prominence.  No edema or consolidation. Electronically Signed   By: Lowella Grip III M.D.   On: 07/27/2015 10:21    EKG: Independently reviewed. Atrial fibrillation at a rate of 109, normal axis, poor R-wave progression  Assessment/Plan Principal Problem:   ARF (acute renal failure) (HCC) Active Problems:   COPD exacerbation (HCC)  Type  2 diabetes mellitus (HCC)   TOBACCO ABUSE   Essential hypertension, benign   Mixed hyperlipidemia   Persistent atrial fibrillation (HCC)   CKD (chronic kidney disease) stage 3, GFR 30-59 ml/min   Leukocytosis    Acute renal failure on chronic kidney disease stage III  -Baseline creatinine appears to be between 1.2 and 1.3. His creatinine is 3.65 on admission. -Suspect due to prerenal azotemia. Patient admits to having decreased oral intake due to his upper respiratory symptoms. -Start IV fluids at 75 mL an hour, recheck renal function in a.m. -Hold nephrotoxins  COPD with acute exacerbation -Start IV steroids, nebs, empiric antibiotics. -Would benefit from addition of Spiriva to his Advair upon discharge.  Hypertension -Currently well controlled. -We will hold his losartan and Lasix due to his acute renal failure.  Hyperlipidemia -Continue statin  Atrial fibrillation -Currently rate controlled, continue Coreg, not on anticoagulation for unknown reasons.  Type 2 diabetes -Check hemoglobin A1c, place on moderate sliding scale insulin and hold metformin upon admission.  Tobacco abuse -Counseled on cessation, refuses nicotine patch   DVT prophylaxis: Lovenox  Code Status: Full code  Family Communication: Patient only  Disposition Plan: Admission  Consults called: None  Admission status: Inpatient    Time Spent: 75 minutes  Lelon Frohlich MD [facility] Pager [phone]  If 7PM-7AM, please contact night-coverage [email or web address] Password [email or web address]  07/27/2015, 6:02 PM

## 2015-07-27 NOTE — Progress Notes (Signed)
Patient arrived to unit. VSS. Patient is in no distress/pain. Order/chart reviewed. RN will continue to monitor.   

## 2015-07-27 NOTE — ED Notes (Signed)
Pt sent here from Dr. Durene Cal office for evaluation of falls, weakness and irregular heart beat. Pt has a history of a fib. Spouse states she thinks he may have pneumonia

## 2015-07-27 NOTE — ED Provider Notes (Signed)
CSN: [id number]     Arrival date & time 07/27/15  [id number] History  By signing my name below, I, Rayna Sexton, attest that this documentation has been prepared under the direction and in the presence of Julianne Rice, MD. Electronically Signed: Rayna Sexton, ED Scribe. 07/27/2015. 11:32 AM.   Chief Complaint  Patient presents with  . Irregular Heart Beat   The history is provided by the patient. No language interpreter was used.    HPI Comments: Donald Gaines is a 75 y.o. male with a PMHx of TIA, HLD, HTN, COPD, a-fib and MI who presents to the Emergency Department complaining of nonproductive cough for 2 weeks. He's had intermittent fever. He's had increased generalized weakness and decreased appetite. Patient states he's been using his nebulized albuterol frequently. He is on home O2 2 L. Denies any chest pain but states he's had increase palpitations. No new lower extremity swelling or tenderness.    Past Medical History  Diagnosis Date  . TIA (transient ischemic attack)     2005  . GERD (gastroesophageal reflux disease)   . Hyperlipidemia   . Essential hypertension, benign   . Depression   . Diabetes mellitus, type 2 ([place])   . Restrictive lung disease   . Candida esophagitis ([place])   . Hemorrhoids   . Arteriovenous malformation small bowel     Capsule study 3/10  . GI bleeding     Cecal ulcers, arteriovenous malformations  . Chronic diarrhea SEP 2011    ?ETIOLOGY-DIABETIC ENTEROPATHY, LACTOSE INTOLERANCE,  OR IBS-MIXED  . Bloating AUG 2009    NL HBT FOR SIBO  . Anemia FEB 2008 FEDA 2o to AVMs, & large colon ulcers    HB 10.7 MCV 74.3; Dr Tressie Stalker  . BPH (benign prostatic hypertrophy)     Dr [place] Pink  . Glaucoma   . Cataract   . Bilateral carotid artery stenosis     50-75% external left carotid stenosis  . Coronary atherosclerosis of native coronary artery     Multivessel, BMS SVG TO RCA 2000, reportedly  "2" additional stents in interim  . Myocardial infarction  ([place])     1994  . COPD (chronic obstructive pulmonary disease) ([place])   . Sleep apnea   . Portal hypertensive gastropathy 11/08/2012    EGD. Dr. Laural Golden  . Dementia    Past Surgical History  Procedure Laterality Date  . Esophagogastroduodenoscopy  SEP 09    DUODENAL LIPOMA  . Capsule endo  03/10  . Right inguinal hernia repair    . Hydrogen breath test  2009  . Colonoscopy  APR 2008    SIMPLE ADENOMA, [place] TICS, IH  . Colonoscopy  FEB 2008 ANEMIA. [facility]LENA     2 LARGE ILEOCEAL ULCERS 2o to ASA, [place] TICS, IH  . Colonoscopy  SEP 2009 TRANSFUSION DEP ANEMIA    AC AVM-ABLATED, [place] TICS, IH  . Upper gastrointestinal endoscopy  SEP 2009    GASTRIC AVM ABLATED, NL DUO Bx  . Cardiac catherization  04/2012  . Cardiac catheterization  04/05/1996 [facility]    EF 45%, occluded SVG to circumflex, patent SVG to D1 and PDA and patent LIMA to LAD with severe native vessel disease.  . Cardiac catheterization  08/22/1998 [facility]    Mild LM, severe LAD,severe CX, occlude RCA, patient  SVG to diatal RCA, patent vein graft to D1 and patent LIMA to LAD.   Marland Kitchen Coronary angioplasty with stent placement  08/22/1998 [facility]  stenting of the SVG to RCA-Last seen by cardiologist in 2010.  Marland Kitchen Coronary artery bypass graft  1995-triple bypass    LIMA-LAD, LIMA to intermediate branch, SVG to PD and second OM,  . Cataract extraction w/phaco Right 09/22/2012    Procedure: CATARACT EXTRACTION PHACO AND INTRAOCULAR LENS PLACEMENT (IOC);  Surgeon: Tonny Branch, MD;  Location: [facility] ORS;  Service: Ophthalmology;  Laterality: Right;  CDE: 11.43  . Cataract extraction w/phaco Left 10/17/2012    Procedure: CATARACT EXTRACTION PHACO AND INTRAOCULAR LENS PLACEMENT (IOC);  Surgeon: Tonny Branch, MD;  Location: [facility] ORS;  Service: Ophthalmology;  Laterality: Left;  CDE: 8.06  . Colonoscopy with esophagogastroduodenoscopy (egd) N/A 11/08/2012    Procedure: COLONOSCOPY WITH ESOPHAGOGASTRODUODENOSCOPY (EGD);  Surgeon: Rogene Houston, MD;  Location: [facility] ENDO SUITE;  Service: Endoscopy;  Laterality: N/A;  250-rescheduled to 7:30 Ann to notify pt  . [place] capsule study N/A 11/24/2012    Procedure: GIVENS CAPSULE STUDY;  Surgeon: Rogene Houston, MD;  Location: [facility] ENDO SU[facility];  Service: Endoscopy;  Laterality: N/A;  730  . Esophagogastroduodenoscopy N/A 09/27/2013    Procedure: ESOPHAGOGASTRODUODENOSCOPY (EGD) Push enteroscopy;  Surgeon: Rogene Houston, MD;  Location: [facility] SU[facility];  Service: Endoscopy;  Laterality: N/A;  . Hot hemostasis  09/27/2013    Procedure: HOT HEMOSTASIS (ARGON PLASMA COAGULATION/BICAP);  Surgeon: Rogene Houston, MD;  Location: [facility];  Service: Endoscopy;;   Family History  Problem Relation Age of Onset  . Heart attack Mother   . Heart disease Mother   . Hypertension Mother    Social History  Substance Use Topics  . Smoking status: Current Every Day Smoker -- 1.00 packs/day for 60 years    Types: Cigarettes  . Smokeless tobacco: Never Used     Comment: 1 pack a day  . Alcohol Use: No     Comment: No etoh in 12 yrs.    Review of Systems  Constitutional: Positive for fever, activity change, appetite change and fatigue.  Respiratory: Positive for cough, shortness of breath and wheezing. Negative for chest tightness.   Cardiovascular: Positive for palpitations. Negative for chest pain and leg swelling.  Gastrointestinal: Negative for nausea, vomiting, abdominal pain and diarrhea.  Genitourinary: Negative for dysuria, frequency and flank pain.  Musculoskeletal: Negative for back pain and neck pain.  Skin: Negative for rash and wound.  Neurological: Positive for weakness (generalized). Negative for dizziness, syncope, light-headedness, numbness and headaches.  All other systems reviewed and are negative.  Allergies  Penicillins  Home Medications   Prior to Admission medications   Medication Sig Start Date End Date Taking? Authorizing Provider  ADVAIR DISKUS 250-50 MCG/DOSE AEPB  Inhale 1 puff into the lungs 2 (two) times daily.  10/02/10  Yes Historical Provider, MD  albuterol (PROAIR HFA) 108 (90 BASE) MCG/ACT inhaler Inhale 2 puffs into the lungs every 6 (six) hours as needed for wheezing or shortness of breath.   Yes Historical Provider, MD  ALPRAZolam Duanne Moron) 0.5 MG tablet Take 0.5 mg by mouth 2 (two) times daily as needed for anxiety.    Yes Historical Provider, MD  carvedilol (COREG) 12.5 MG tablet Take 12.5 mg by mouth 2 (two) times daily with a meal.  03/26/14  Yes Historical Provider, MD  cetirizine (ZYRTEC) 10 MG tablet Take 10 mg by mouth daily.   Yes Historical Provider, MD  clopidogrel (PLAVIX) 75 MG tablet Take 1 tablet (75 mg total) by mouth every morning. 09/27/13  Yes Nita Sells, MD  escitalopram (LEXAPRO) 5 MG tablet Take 5 mg by mouth daily.  04/16/15  Yes Historical Provider, MD  ferrous sulfate 325 (65 FE) MG tablet Take 325 mg by mouth daily with breakfast.   Yes Historical Provider, MD  furosemide (LASIX) 40 MG tablet Take 40 mg by mouth 2 (two) times daily.    Yes Historical Provider, MD  gabapentin (NEURONTIN) 100 MG capsule Take 100 mg by mouth at bedtime.  09/20/14  Yes Historical Provider, MD  isosorbide mononitrate (IMDUR) 60 MG 24 hr tablet Take 1 tablet (60 mg total) by mouth daily. 06/01/13  Yes Lendon Colonel, NP  JANUMET 50-1000 MG per tablet Take 1 tablet by mouth 2 (two) times daily with a meal.  07/22/13  Yes Historical Provider, MD  losartan (COZAAR) 25 MG tablet Take 1 tablet (25 mg total) by mouth daily. 12/04/13  Yes Satira Sark, MD  NITROSTAT 0.4 MG SL tablet Place 0.4 mg under the tongue every 5 (five) minutes as needed for chest pain.  05/23/10  Yes Historical Provider, MD  omeprazole (PRILOSEC) 20 MG capsule Take 20 mg by mouth daily.  08/28/13  Yes Historical Provider, MD  potassium chloride SA (K-DUR,KLOR-CON) 20 MEQ tablet Take 20 mEq by mouth 2 (two) times daily.  04/16/15  Yes Historical Provider, MD  rosuvastatin  (CRESTOR) 20 MG tablet Take 1 tablet (20 mg total) by mouth daily. 12/12/13  Yes Satira Sark, MD   BP 96/63 mmHg  Pulse 104  Temp(Src) 97.8 F (36.6 C) (Oral)  Resp 20  SpO2 91% Physical Exam  Constitutional: He is oriented to person, place, and time. He appears well-developed and well-nourished. No distress.  HENT:  Head: Normocephalic and atraumatic.  Mouth/Throat: Oropharynx is clear and moist. No oropharyngeal exudate.  Eyes: EOM are normal. Pupils are equal, round, and reactive to light.  Neck: Normal range of motion. Neck supple. No JVD present.  Cardiovascular:  Tachycardia, irregularly irregular  Pulmonary/Chest: Effort normal. No respiratory distress. He has wheezes. He has rales.  Patient with diffuse rhonchi and expiratory wheezes. prolonged respiratory phase.  Abdominal: Soft. Bowel sounds are normal. He exhibits no distension and no mass. There is no tenderness. There is no rebound and no guarding.  Musculoskeletal: Normal range of motion. He exhibits no edema or tenderness.  No lower extremity swelling, tenderness or asymmetry. Distal pulses are equal and intact.  Neurological: He is alert and oriented to person, place, and time.  Mild confusion and forgetfulness. Moving all extremities without focal deficit. Sensation is grossly intact.  Skin: Skin is warm and dry. No rash noted. No erythema.  Psychiatric: He has a normal mood and affect. His behavior is normal.  Nursing note and vitals reviewed.   ED Course  Procedures  DIAGNOSTIC STUDIES: Oxygen Saturation is 95% on [place] on 2L/min, adequate by my interpretation.    COORDINATION OF CARE: 11:32 AM Discussed next steps with pt. He verbalized understanding and is agreeable with the plan.   Labs Review Labs Reviewed  CBC WITH DIFFERENTIAL/PLATELET - Abnormal; Notable for the following:    WBC 13.8 (*)    RBC 3.83 (*)    Hemoglobin 12.3 (*)    HCT 36.4 (*)    Neutro Abs 11.1 (*)    Monocytes Absolute 1.7 (*)     All other components within normal limits  COMPREHENSIVE METABOLIC PANEL - Abnormal; Notable for the following:    Glucose, Bld 153 (*)    BUN 40 (*)  Creatinine, Ser 3.65 (*)    ALT 11 (*)    GFR calc non Af Amer 15 (*)    GFR calc Af Amer 18 (*)    All other components within normal limits  URINALYSIS, ROUTINE W REFLEX MICROSCOPIC (NOT AT [facility]) - Abnormal; Notable for the following:    Hgb urine dipstick MODERATE (*)    Protein, ur 100 (*)    All other components within normal limits  BRAIN NATRIURETIC PEPTIDE - Abnormal; Notable for the following:    B Natriuretic Peptide 409.0 (*)    All other components within normal limits  URINE MICROSCOPIC-ADD ON - Abnormal; Notable for the following:    Bacteria, UA FEW (*)    Casts GRANULAR CAST (*)    All other components within normal limits  I-STAT CG4 LACTIC ACID, ED - Abnormal; Notable for the following:    Lactic Acid, Venous 2.10 (*)    All other components within normal limits  CULTURE, BLOOD (ROUTINE X 2)  CULTURE, BLOOD (ROUTINE X 2)  I-STAT TROPOININ, ED    Imaging Review Dg Chest 2 View  07/27/2015  CLINICAL DATA:  Tachycardia and shortness of breath EXAM: CHEST  2 VIEW COMPARISON:  December 25, 2014 FINDINGS: Lungs are clear. The heart is borderline enlarged with pulmonary vascularity within normal limits. Patient is status post internal mammary bypass grafting. No adenopathy. There is degenerative change in the thoracic spine and shoulder regions. IMPRESSION: Stable cardiac prominence.  No edema or consolidation. Electronically Signed   By: Lowella Grip III M.D.   On: 07/27/2015 10:21   I have personally reviewed and evaluated these images and lab results as part of my medical decision-making.   EKG Interpretation None      MDM   Final diagnoses:  Acute renal failure, unspecified acute renal failure type (HCC)  Atrial fibrillation with RVR (HCC)  COPD exacerbation (HCC)    I personally performed the  services described in this documentation, which was scribed in my presence. The recorded information has been reviewed and is accurate.    Patient's heart rate is improved after IV fluids. Not improvement in blood pressure. Noted to be in acute renal failure. Possibly due to dehydration. Mild elevation in lactic acid but no source for infection. Will hold antibiotics at this time. Discussed with Dr. Jerilee Hoh. Will admit to telemetry bed.  Julianne Rice, MD 07/27/15 [phone]

## 2015-07-27 NOTE — ED Notes (Signed)
RT paged for neb tx.

## 2015-07-28 LAB — CBC
HCT: 32.3 % — ABNORMAL LOW (ref 39.0–52.0)
Hemoglobin: 10.7 g/dL — ABNORMAL LOW (ref 13.0–17.0)
MCH: 31.9 pg (ref 26.0–34.0)
MCHC: 33.1 g/dL (ref 30.0–36.0)
MCV: 96.4 fL (ref 78.0–100.0)
PLATELETS: 150 10*3/uL (ref 150–400)
RBC: 3.35 MIL/uL — AB (ref 4.22–5.81)
RDW: 13.3 % (ref 11.5–15.5)
WBC: 10.5 10*3/uL (ref 4.0–10.5)

## 2015-07-28 LAB — GLUCOSE, CAPILLARY
GLUCOSE-CAPILLARY: 182 mg/dL — AB (ref 65–99)
GLUCOSE-CAPILLARY: 195 mg/dL — AB (ref 65–99)
Glucose-Capillary: 208 mg/dL — ABNORMAL HIGH (ref 65–99)
Glucose-Capillary: 189 mg/dL — ABNORMAL HIGH (ref 65–99)

## 2015-07-28 LAB — BASIC METABOLIC PANEL
Anion gap: 10 (ref 5–15)
BUN: 45 mg/dL — AB (ref 6–20)
CALCIUM: 8.7 mg/dL — AB (ref 8.9–10.3)
CO2: 26 mmol/L (ref 22–32)
CREATININE: 3.39 mg/dL — AB (ref 0.61–1.24)
Chloride: 102 mmol/L (ref 101–111)
GFR calc non Af Amer: 17 mL/min — ABNORMAL LOW (ref >60)
GFR, EST AFRICAN AMERICAN: 19 mL/min — AB (ref >60)
Glucose, Bld: 180 mg/dL — ABNORMAL HIGH (ref 65–99)
Potassium: 3.9 mmol/L (ref 3.5–5.1)
Sodium: 138 mmol/L (ref 135–145)

## 2015-07-28 MED ORDER — VANCOMYCIN HCL IN DEXTROSE 1-5 GM/200ML-% IV SOLN: 1000.0000 mg | INTRAVENOUS | Status: DC

## 2015-07-28 MED ORDER — SODIUM CHLORIDE 0.9 % IV SOLN
1250.0000 mg | Freq: Once | INTRAVENOUS | Status: AC
  Administered 2015-07-28: 1250 mg via INTRAVENOUS
  Filled 2015-07-28: qty 1250

## 2015-07-28 NOTE — Progress Notes (Signed)
PROGRESS NOTE    Donald Gaines  [id number] DOB: [birth year] DOA: 07/27/2015 PCP: Wende Neighbors, MD     Brief Narrative:  75 year old man admitted on 5/6 with complaints of cough and shortness of breath. Believed to have a COPD exacerbation and also found to be in acute renal failure with a creatinine of 3.65. Shortness of breath has improved with treatment of his COPD, creatinine is down after IV fluids and holding nephrotoxins.   Assessment & Plan:   Principal Problem:   ARF (acute renal failure) (HCC) Active Problems:   COPD exacerbation (HCC)   Type 2 diabetes mellitus ([place])   TOBACCO ABUSE   Essential hypertension, benign   Mixed hyperlipidemia   Persistent atrial fibrillation (HCC)   CKD (chronic kidney disease) stage 3, GFR 30-59 ml/min   Leukocytosis   Acute renal failure on chronic kidney disease stage III -Baseline creatinine appears to be between 1.2 and 1.3. His creatinine was 3.65 on admission and is down to 3.39 on 5/7. -Continue IV fluids, continue to hold diuretic and losartan.  COPD with acute exacerbation -Improved today less wheezing, continue IV steroids at current dose without titrating, nebs, empiric antibiotics. -Would benefit from addition of Spiriva to his Advair at time of discharge.  Hypertension -Currently well controlled. -We will hold his losartan and Lasix due to his acute renal failure.  Hyperlipidemia -Continue statin  Atrial fibrillation -Currently rate controlled, continue Coreg, not on anticoagulation for unknown reasons.  Type 2 diabetes -Check hemoglobin A1c, place on moderate sliding scale insulin and hold metformin upon admission.  Tobacco abuse -Counseled on cessation, refuses nicotine patch    DVT prophylaxis: Lovenox Code Status: Full code Family Communication: Patient only Disposition Plan: To be determined  Consultants:   None  Procedures:   None  Antimicrobials:   Levaquin    Subjective: Feels much  improved, less short of breath, states he did not cough overnight.  Objective: Filed Vitals:   07/27/15 2017 07/27/15 2044 07/28/15 0455 07/28/15 0809  BP:  99/64 112/58   Pulse:  82 80   Temp:  98.1 F (36.7 C) 98.2 F (36.8 C)   TempSrc:  Oral Oral   Resp:  20 22   Height:      Weight:      SpO2: 92% 93% 94% 94%    Intake/Output Summary (Last 24 hours) at 07/28/15 1228 Last data filed at 07/28/15 1108  Gross per 24 hour  Intake 1418.75 ml  Output    450 ml  Net 968.75 ml   Filed Weights   07/27/15 1352  Weight: 77.4 kg (170 lb 10.2 oz)    Examination:   General exam: Alert, awake, oriented x 3 Respiratory system: Coarse bilateral breath sounds, mild bilateral expiratory wheezing Cardiovascular system:RRR. No murmurs, rubs, gallops. Gastrointestinal system: Abdomen is nondistended, soft and nontender. No organomegaly or masses felt. Normal bowel sounds heard. Central nervous system: Alert and oriented. No focal neurological deficits. Extremities: No C/C/E, +pedal pulses Skin: No rashes, lesions or ulcers Psychiatry: Judgement and insight appear normal. Mood & affect appropriate.     Data Reviewed: I have personally reviewed following labs and imaging studies  CBC:  Recent Labs Lab 07/27/15 0940 07/28/15 0558  WBC 13.8* 10.5  NEUTROABS 11.1*  --   HGB 12.3* 10.7*  HCT 36.4* 32.3*  MCV 95.0 96.4  PLT 165 [id number]   Basic Metabolic Panel:  Recent Labs Lab 07/27/15 0940 07/28/15 0558  NA 137 138  K 4.6 3.9  CL 101 102  CO2 23 26  GLUCOSE 153* 180*  BUN 40* 45*  CREATININE 3.65* 3.39*  CALCIUM 9.5 8.7*   GFR: Estimated Creatinine Clearance: 18.9 mL/min (by C-G formula based on Cr of 3.39). Liver Function Tests:  Recent Labs Lab 07/27/15 0940  AST 23  ALT 11*  ALKPHOS 89  BILITOT 1.0  PROT 8.1  ALBUMIN 4.2   No results for input(s): LIPASE, AMYLASE in the last 168 hours. No results for input(s): AMMONIA in the last 168 hours. Coagulation  Profile: No results for input(s): INR, PROTIME in the last 168 hours. Cardiac Enzymes: No results for input(s): CKTOTAL, CKMB, CKMBINDEX, TROPONINI in the last 168 hours. BNP (last 3 results) No results for input(s): PROBNP in the last 8760 hours. HbA1C: No results for input(s): HGBA1C in the last 72 hours. CBG:  Recent Labs Lab 07/27/15 2044 07/28/15 0751 07/28/15 1120  GLUCAP 264* 182* 208*   Lipid Profile: No results for input(s): CHOL, HDL, LDLCALC, TRIG, CHOLHDL, LDLDIRECT in the last 72 hours. Thyroid Function Tests: No results for input(s): TSH, T4TOTAL, FREET4, T3FREE, THYROIDAB in the last 72 hours. Anemia Panel: No results for input(s): VITAMINB12, FOLATE, FERRITIN, TIBC, IRON, RETICCTPCT in the last 72 hours. Urine analysis:    Component Value Date/Time   COLORURINE YELLOW 07/27/2015 1020   APPEARANCEUR CLEAR 07/27/2015 1020   LABSPEC 1.010 07/27/2015 1020   PHURINE 6.0 07/27/2015 1020   GLUCOSEU NEGATIVE 07/27/2015 1020   HGBUR MODERATE* 07/27/2015 1020   BILIRUBINUR NEGATIVE 07/27/2015 1020   KETONESUR NEGATIVE 07/27/2015 1020   PROTEINUR 100* 07/27/2015 1020   UROBILINOGEN 1.0 12/25/2014 2210   NITRITE NEGATIVE 07/27/2015 1020   LEUKOCYTESUR NEGATIVE 07/27/2015 1020   Sepsis Labs: @LABRCNTIP (procalcitonin:4,lacticidven:4)  ) Recent Results (from the past 240 hour(s))  Culture, blood (Routine X 2) w Reflex to ID Panel     Status: None (Preliminary result)   Collection Time: 07/27/15 10:20 AM  Result Value Ref Range Status   Specimen Description BLOOD LEFT HAND  Final   Special Requests BOTTLES DRAWN AEROBIC AND ANAEROBIC 6CC  Final   Culture NO GROWTH < 24 HOURS  Final   Report Status PENDING  Incomplete  Culture, blood (Routine X 2) w Reflex to ID Panel     Status: None (Preliminary result)   Collection Time: 07/27/15 10:25 AM  Result Value Ref Range Status   Specimen Description BLOOD RIGHT HAND  Final   Special Requests BOTTLES DRAWN AEROBIC AND  ANAEROBIC 6CC  Final   Culture NO GROWTH < 24 HOURS  Final   Report Status PENDING  Incomplete         Radiology Studies: Dg Chest 2 View  07/27/2015  CLINICAL DATA:  Tachycardia and shortness of breath EXAM: CHEST  2 VIEW COMPARISON:  December 25, 2014 FINDINGS: Lungs are clear. The heart is borderline enlarged with pulmonary vascularity within normal limits. Patient is status post internal mammary bypass grafting. No adenopathy. There is degenerative change in the thoracic spine and shoulder regions. IMPRESSION: Stable cardiac prominence.  No edema or consolidation. Electronically Signed   By: Lowella Grip III M.D.   On: 07/27/2015 10:21        Scheduled Meds: . carvedilol  12.5 mg Oral BID WC  . citalopram  20 mg Oral Daily  . clopidogrel  75 mg Oral q morning - 10a  . enoxaparin (LOVENOX) injection  30 mg Subcutaneous Q24H  . ferrous sulfate  325 mg Oral Q breakfast  .  gabapentin  100 mg Oral QHS  . insulin aspart  0-15 Units Subcutaneous TID WC  . insulin aspart  0-5 Units Subcutaneous QHS  . insulin aspart  4 Units Subcutaneous TID WC  . ipratropium-albuterol  3 mL Nebulization TID  . isosorbide mononitrate  60 mg Oral Daily  . levofloxacin  500 mg Oral Q48H  . loratadine  10 mg Oral Daily  . methylPREDNISolone (SOLU-MEDROL) injection  60 mg Intravenous Q12H  . mometasone-formoterol  2 puff Inhalation BID  . pantoprazole  40 mg Oral Daily  . potassium chloride SA  20 mEq Oral BID  . rosuvastatin  20 mg Oral Daily   Continuous Infusions: . sodium chloride 75 mL/hr at 07/28/15 1124     LOS: 1 day    Time spent: 25 minutes. Greater than 50% of this time was spent in direct contact with the patient coordinating care.     Lelon Frohlich, MD [facility] Pager [phone]  If 7PM-7AM, please contact night-coverage [email or web address] Password [facility] 07/28/2015, 12:28 PM

## 2015-07-28 NOTE — Progress Notes (Signed)
ANTIBIOTIC CONSULT NOTE - INITIAL  Pharmacy Consult for Vancomycin Indication: bacteremia  Allergies  Allergen Reactions  . Penicillins Rash    Has patient had a PCN reaction causing immediate rash, facial/tongue/throat swelling, SOB or lightheadedness with hypotension: Yes Has patient had a PCN reaction causing severe rash involving mucus membranes or skin necrosis: No Has patient had a PCN reaction that required hospitalization No Has patient had a PCN reaction occurring within the last 10 years: No If all of the above answers are "NO", then may proceed with Cephalosporin use.     Patient Measurements: Height: 5\' 5"  (165.1 cm) Weight: 170 lb 10.2 oz (77.4 kg) IBW/kg (Calculated) : 61.5 Adjusted Body Weight:   Vital Signs: Temp: 97.6 F (36.4 C) (05/07 1621) Temp Source: Oral (05/07 1621) BP: 115/84 mmHg (05/07 1750) Pulse Rate: 91 (05/07 1750) Intake/Output from previous day: 05/06 0701 - 05/07 0700 In: 1418.8 [P.O.:480; I.V.:938.8] Out: 200 [Urine:200] Intake/Output from this shift:    Labs:  Recent Labs  07/27/15 0940 07/28/15 0558  WBC 13.8* 10.5  HGB 12.3* 10.7*  PLT 165 150  CREATININE 3.65* 3.39*   Estimated Creatinine Clearance: 18.9 mL/min (by C-G formula based on Cr of 3.39). No results for input(s): VANCOTROUGH, VANCOPEAK, VANCORANDOM, GENTTROUGH, GENTPEAK, GENTRANDOM, TOBRATROUGH, TOBRAPEAK, TOBRARND, AMIKACINPEAK, AMIKACINTROU, AMIKACIN in the last 72 hours.   Microbiology: Recent Results (from the past 720 hour(s))  Culture, blood (Routine X 2) w Reflex to ID Panel     Status: None (Preliminary result)   Collection Time: 07/27/15 10:20 AM  Result Value Ref Range Status   Specimen Description BLOOD LEFT HAND  Final   Special Requests BOTTLES DRAWN AEROBIC AND ANAEROBIC 6CC  Final   Culture NO GROWTH < 24 HOURS  Final   Report Status PENDING  Incomplete  Culture, blood (Routine X 2) w Reflex to ID Panel     Status: None (Preliminary result)   Collection Time: 07/27/15 10:25 AM  Result Value Ref Range Status   Specimen Description BLOOD RIGHT HAND  Final   Special Requests BOTTLES DRAWN AEROBIC AND ANAEROBIC 6CC  Final   Culture   Final    GRAM POSITIVE COCCI Gram Stain Report Called to,Read Back By and Verified With: THOMAS,K @1943  ON 07/28/15 BY WOODIE,J GS DONE @ [facility]    Report Status PENDING  Incomplete    Medical History: Past Medical History  Diagnosis Date  . TIA (transient ischemic attack)     2005  . GERD (gastroesophageal reflux disease)   . Hyperlipidemia   . Essential hypertension, benign   . Depression   . Diabetes mellitus, type 2 ([place])   . Restrictive lung disease   . Candida esophagitis ([place])   . Hemorrhoids   . Arteriovenous malformation small bowel     Capsule study 3/10  . GI bleeding     Cecal ulcers, arteriovenous malformations  . Chronic diarrhea SEP 2011    ?ETIOLOGY-DIABETIC ENTEROPATHY, LACTOSE INTOLERANCE,  OR IBS-MIXED  . Bloating AUG 2009    NL HBT FOR SIBO  . Anemia FEB 2008 FEDA 2o to AVMs, & large colon ulcers    HB 10.7 MCV 74.3; Dr Tressie Stalker  . BPH (benign prostatic hypertrophy)     Dr [place] Pink  . Glaucoma   . Cataract   . Bilateral carotid artery stenosis     50-75% external left carotid stenosis  . Coronary atherosclerosis of native coronary artery     Multivessel, BMS SVG TO RCA 2000, reportedly  "2"  additional stents in interim  . Myocardial infarction ([place])     1994  . COPD (chronic obstructive pulmonary disease) ([place])   . Sleep apnea   . Portal hypertensive gastropathy 11/08/2012    EGD. Dr. Laural Golden  . Dementia     Medications:  Scheduled:  . carvedilol  12.5 mg Oral BID WC  . citalopram  20 mg Oral Daily  . clopidogrel  75 mg Oral q morning - 10a  . enoxaparin (LOVENOX) injection  30 mg Subcutaneous Q24H  . ferrous sulfate  325 mg Oral Q breakfast  . gabapentin  100 mg Oral QHS  . insulin aspart  0-15 Units Subcutaneous TID WC  . insulin aspart  0-5 Units  Subcutaneous QHS  . insulin aspart  4 Units Subcutaneous TID WC  . ipratropium-albuterol  3 mL Nebulization TID  . isosorbide mononitrate  60 mg Oral Daily  . levofloxacin  500 mg Oral Q48H  . loratadine  10 mg Oral Daily  . methylPREDNISolone (SOLU-MEDROL) injection  60 mg Intravenous Q12H  . mometasone-formoterol  2 puff Inhalation BID  . pantoprazole  40 mg Oral Daily  . potassium chloride SA  20 mEq Oral BID  . rosuvastatin  20 mg Oral Daily  . vancomycin  1,250 mg Intravenous Once  . [START ON 07/30/2015] vancomycin  1,000 mg Intravenous Q48H   Assessment: 75 yo male, complaints of cough and shortness of breath. Believed to have COPD exacerbation, in acute renal failure with SCR 3.39 today. Presently on Levaquin. Pharmacy protocol to dose Vancomycin for bacteremia.  Goal of Therapy:  Vancomycin trough level 15-20 mcg/ml  Plan:  Vancomycin 1250 mg IV loading dose, then Vancomycin 1 GM IV every 48 hours. Vancomycin trough at steady state Monitor renal function. Labs per protocol  Abner Greenspan, Kayliah Tindol Bennett 07/28/2015,8:36 PM

## 2015-07-29 DIAGNOSIS — R7881 Bacteremia: Secondary | ICD-10-CM

## 2015-07-29 DIAGNOSIS — A499 Bacterial infection, unspecified: Secondary | ICD-10-CM

## 2015-07-29 LAB — GLUCOSE, CAPILLARY
GLUCOSE-CAPILLARY: 184 mg/dL — AB (ref 65–99)
GLUCOSE-CAPILLARY: 233 mg/dL — AB (ref 65–99)
Glucose-Capillary: 175 mg/dL — ABNORMAL HIGH (ref 65–99)
Glucose-Capillary: 169 mg/dL — ABNORMAL HIGH (ref 65–99)

## 2015-07-29 LAB — HEMOGLOBIN A1C
HEMOGLOBIN A1C: 6.1 % — AB (ref 4.8–5.6)
MEAN PLASMA GLUCOSE: 128 mg/dL

## 2015-07-29 LAB — CBC
HEMATOCRIT: 29.1 % — AB (ref 39.0–52.0)
Hemoglobin: 9.8 g/dL — ABNORMAL LOW (ref 13.0–17.0)
MCH: 32.5 pg (ref 26.0–34.0)
MCHC: 33.7 g/dL (ref 30.0–36.0)
MCV: 96.4 fL (ref 78.0–100.0)
Platelets: 150 10*3/uL (ref 150–400)
RBC: 3.02 MIL/uL — AB (ref 4.22–5.81)
RDW: 13.4 % (ref 11.5–15.5)
WBC: 12.7 10*3/uL — ABNORMAL HIGH (ref 4.0–10.5)

## 2015-07-29 LAB — BASIC METABOLIC PANEL
ANION GAP: 8 (ref 5–15)
BUN: 47 mg/dL — ABNORMAL HIGH (ref 6–20)
CALCIUM: 8.4 mg/dL — AB (ref 8.9–10.3)
CO2: 24 mmol/L (ref 22–32)
Chloride: 108 mmol/L (ref 101–111)
Creatinine, Ser: 2.89 mg/dL — ABNORMAL HIGH (ref 0.61–1.24)
GFR, EST AFRICAN AMERICAN: 23 mL/min — AB (ref >60)
GFR, EST NON AFRICAN AMERICAN: 20 mL/min — AB (ref >60)
GLUCOSE: 160 mg/dL — AB (ref 65–99)
POTASSIUM: 3.9 mmol/L (ref 3.5–5.1)
Sodium: 140 mmol/L (ref 135–145)

## 2015-07-29 LAB — MAGNESIUM: Magnesium: 1.9 mg/dL (ref 1.7–2.4)

## 2015-07-29 MED ORDER — ENSURE ENLIVE PO LIQD: 237.0000 mL | ORAL | Status: DC | PRN

## 2015-07-29 NOTE — Progress Notes (Signed)
PROGRESS NOTE    Donald Gaines  [id number] DOB: 1940-09-26 DOA: 07/27/2015 PCP: Wende Neighbors, MD     Brief Narrative:  75 year old man admitted on 5/6 with complaints of cough and shortness of breath. Believed to have a COPD exacerbation and also found to be in acute renal failure with a creatinine of 3.65. Shortness of breath has improved with treatment of his COPD, creatinine is down after IV fluids and holding nephrotoxins. On 5/8, received report of one out of 2 blood cultures with gram-positive bacteria.   Assessment & Plan:   Principal Problem:   ARF (acute renal failure) (HCC) Active Problems:   COPD exacerbation (HCC)   Type 2 diabetes mellitus ([place])   TOBACCO ABUSE   Essential hypertension, benign   Mixed hyperlipidemia   Persistent atrial fibrillation (HCC)   CKD (chronic kidney disease) stage 3, GFR 30-59 ml/min   Leukocytosis   Gram-positive bacteremia   Acute renal failure on chronic kidney disease stage III -Baseline creatinine appears to be between 1.2 and 1.3. His creatinine was 3.65 on admission and is down to 3.39 on 5/7; 2.89 on 5/8. -Continue IV fluids, continue to hold diuretic and losartan.  COPD with acute exacerbation -Improved today less wheezing, continue IV steroids at current dose without titrating, nebs, empiric antibiotics. -Would benefit from addition of Spiriva to his Advair at time of discharge.  Gram-positive bacteremia -One out of 2 cultures, suspect contaminant. -Agree with vancomycin pending final culture data.  Hypertension -Currently well controlled. -We will hold his losartan and Lasix due to his acute renal failure.  Hyperlipidemia -Continue statin  Atrial fibrillation -Currently rate controlled, continue Coreg, not on anticoagulation for unknown reasons.  Type 2 diabetes -Fair control, continue to adjust insulin as needed.  Tobacco abuse -Counseled on cessation, refuses nicotine patch    DVT prophylaxis:  Lovenox Code Status: Full code Family Communication: Patient only Disposition Plan: To be determined  Consultants:   None  Procedures:   None  Antimicrobials:   Vancomycin  Levaquin   Subjective: Feels much improved, less short of breath, states he did not cough overnight.  Objective: Filed Vitals:   07/29/15 0558 07/29/15 0732 07/29/15 0735 07/29/15 1406  BP: 120/66     Pulse: 110     Temp: 97.5 F (36.4 C)     TempSrc: Oral     Resp: 20     Height:      Weight:      SpO2: 95% 98% 100% 98%    Intake/Output Summary (Last 24 hours) at 07/29/15 1637 Last data filed at 07/29/15 1500  Gross per 24 hour  Intake 1883.75 ml  Output    800 ml  Net 1083.75 ml   Filed Weights   07/27/15 1352  Weight: 77.4 kg (170 lb 10.2 oz)    Examination:   General exam: Alert, awake, oriented x 3 Respiratory system: Coarse bilateral breath sounds, mild bilateral expiratory wheezing Cardiovascular system:RRR. No murmurs, rubs, gallops. Gastrointestinal system: Abdomen is nondistended, soft and nontender. No organomegaly or masses felt. Normal bowel sounds heard. Central nervous system: Alert and oriented. No focal neurological deficits. Extremities: No C/C/E, +pedal pulses Skin: No rashes, lesions or ulcers Psychiatry: Judgement and insight appear normal. Mood & affect appropriate.     Data Reviewed: I have personally reviewed following labs and imaging studies  CBC:  Recent Labs Lab 07/27/15 0940 07/28/15 0558 07/29/15 0437  WBC 13.8* 10.5 12.7*  NEUTROABS 11.1*  --   --  HGB 12.3* 10.7* 9.8*  HCT 36.4* 32.3* 29.1*  MCV 95.0 96.4 96.4  PLT 165 150 [id number]   Basic Metabolic Panel:  Recent Labs Lab 07/27/15 0940 07/28/15 0558 07/29/15 0437  NA 137 138 140  K 4.6 3.9 3.9  CL 101 102 108  CO2 23 26 24   GLUCOSE 153* 180* 160*  BUN 40* 45* 47*  CREATININE 3.65* 3.39* 2.89*  CALCIUM 9.5 8.7* 8.4*   GFR: Estimated Creatinine Clearance: 22.2 mL/min (by C-G  formula based on Cr of 2.89). Liver Function Tests:  Recent Labs Lab 07/27/15 0940  AST 23  ALT 11*  ALKPHOS 89  BILITOT 1.0  PROT 8.1  ALBUMIN 4.2   No results for input(s): LIPASE, AMYLASE in the last 168 hours. No results for input(s): AMMONIA in the last 168 hours. Coagulation Profile: No results for input(s): INR, PROTIME in the last 168 hours. Cardiac Enzymes: No results for input(s): CKTOTAL, CKMB, CKMBINDEX, TROPONINI in the last 168 hours. BNP (last 3 results) No results for input(s): PROBNP in the last 8760 hours. HbA1C:  Recent Labs  07/27/15 0940  HGBA1C 6.1*   CBG:  Recent Labs Lab 07/28/15 1120 07/28/15 1702 07/28/15 2134 07/29/15 0741 07/29/15 1138  GLUCAP 208* 189* 195* 184* 233*   Lipid Profile: No results for input(s): CHOL, HDL, LDLCALC, TRIG, CHOLHDL, LDLDIRECT in the last 72 hours. Thyroid Function Tests: No results for input(s): TSH, T4TOTAL, FREET4, T3FREE, THYROIDAB in the last 72 hours. Anemia Panel: No results for input(s): VITAMINB12, FOLATE, FERRITIN, TIBC, IRON, RETICCTPCT in the last 72 hours. Urine analysis:    Component Value Date/Time   COLORURINE YELLOW 07/27/2015 1020   APPEARANCEUR CLEAR 07/27/2015 1020   LABSPEC 1.010 07/27/2015 1020   PHURINE 6.0 07/27/2015 1020   GLUCOSEU NEGATIVE 07/27/2015 1020   HGBUR MODERATE* 07/27/2015 1020   BILIRUBINUR NEGATIVE 07/27/2015 1020   KETONESUR NEGATIVE 07/27/2015 1020   PROTEINUR 100* 07/27/2015 1020   UROBILINOGEN 1.0 12/25/2014 2210   NITRITE NEGATIVE 07/27/2015 1020   LEUKOCYTESUR NEGATIVE 07/27/2015 1020   Sepsis Labs: @LABRCNTIP (procalcitonin:4,lacticidven:4)  ) Recent Results (from the past 240 hour(s))  Culture, blood (Routine X 2) w Reflex to ID Panel     Status: None (Preliminary result)   Collection Time: 07/27/15 10:20 AM  Result Value Ref Range Status   Specimen Description BLOOD LEFT HAND  Final   Special Requests BOTTLES DRAWN AEROBIC AND ANAEROBIC 6CC  Final    Culture NO GROWTH 2 DAYS  Final   Report Status PENDING  Incomplete  Culture, blood (Routine X 2) w Reflex to ID Panel     Status: None (Preliminary result)   Collection Time: 07/27/15 10:25 AM  Result Value Ref Range Status   Specimen Description BLOOD RIGHT HAND  Final   Special Requests BOTTLES DRAWN AEROBIC AND ANAEROBIC 6CC  Final   Culture  Setup Time   Final    GRAM POSITIVE COCCI IN CLUSTERS AEROBIC BOTTLE ONLY CRITICAL RESULT CALLED TO, READ BACK BY AND VERIFIED WITH: THOMAS,K @1943  ON 07/28/15 BY [place] Planas Performed at [facility]    Culture   Final    [place] REINCUBATED FOR BETTER GROWTH Performed at [facility]    Report Status PENDING  Incomplete         Radiology Studies: No results found.      Scheduled Meds: . carvedilol  12.5 mg Oral BID WC  . citalopram  20 mg Oral Daily  . clopidogrel  75  mg Oral q morning - 10a  . enoxaparin (LOVENOX) injection  30 mg Subcutaneous Q24H  . ferrous sulfate  325 mg Oral Q breakfast  . gabapentin  100 mg Oral QHS  . insulin aspart  0-15 Units Subcutaneous TID WC  . insulin aspart  0-5 Units Subcutaneous QHS  . insulin aspart  4 Units Subcutaneous TID WC  . ipratropium-albuterol  3 mL Nebulization TID  . isosorbide mononitrate  60 mg Oral Daily  . levofloxacin  500 mg Oral Q48H  . loratadine  10 mg Oral Daily  . methylPREDNISolone (SOLU-MEDROL) injection  60 mg Intravenous Q12H  . mometasone-formoterol  2 puff Inhalation BID  . pantoprazole  40 mg Oral Daily  . potassium chloride SA  20 mEq Oral BID  . rosuvastatin  20 mg Oral Daily  . [START ON 07/30/2015] vancomycin  1,000 mg Intravenous Q48H   Continuous Infusions: . sodium chloride 75 mL/hr at 07/29/15 1612     LOS: 2 days    Time spent: 25 minutes. Greater than 50% of this time was spent in direct contact with the patient coordinating care.     Lelon Frohlich, MD [facility] Pager  [phone]  If 7PM-7AM, please contact night-coverage [email or web address] Password [email or web address] 07/29/2015, 4:37 PM

## 2015-07-29 NOTE — Progress Notes (Signed)
Initial Nutrition Assessment  DOCUMENTATION CODES:   Not applicable  INTERVENTION:  Ensure Enlive po prn per pt request- each supplement provides 350 kcal and 20 grams of protein   Food nutrition staff to continue to provide pt individualized meals congruent with his diet RX.   NUTRITION DIAGNOSIS:   Unintentional weight loss related to acute illness as evidenced by per patient/family report.   GOAL:   Patient will meet greater than or equal to 90% of their needs   MONITOR:   Supplement acceptance, PO intake, Weight trends, Labs  REASON FOR ASSESSMENT:   Malnutrition Screening Tool    ASSESSMENT:   75 y.o. male with history of persistent atrial fibrillation, hypertension, hyperlipidemia, type 2 diabetes, GERD, COPD who presents to the hospital today with the above complaints. He states his shortness of breath began about 48 hours ago and has progressively gotten worse  Pt is eating very well 100% of all meals since admission. No difficulty feeding himself. He ambulates with a cane. His weight is down but not significantly. Mild temporal wasting but otherwise  Normal NFPE. No edema.  Prior to admission he says it was more difficult to eat due to his increased breathing difficulty. Now as noted above he says he feels much better and is eating everything. He is providing nutrition staff with meal preferences daily. He would also like to be able to receive Ensure daily. Will add prn.   Abnormal labs: BUN 47, Cr 2.89 . H/H- 9.8/ 29.1.  CBG's today-195, 184, 233. His A1C-on 5/6- was 6.1%.  Meds: Iron, potassium, prilosec, lasix  Diet Order:  Diet heart healthy/carb modified Room service appropriate?: Yes; Fluid consistency:: Thin  Skin:   dry and intact  Last BM:   5/5   Height:   Ht Readings from Last 1 Encounters:  07/27/15 5\' 5"  (1.651 m)    Weight:   Wt Readings from Last 1 Encounters:  07/27/15 170 lb 10.2 oz (77.4 kg)    Ideal Body Weight:  62 kg  BMI:   Body mass index is 28.4 kg/(m^2).  Estimated Nutritional Needs:   Kcal:  2100-2300  Protein:  90 gr  Fluid:  2.0 liters daily  EDUCATION NEEDS:   No education needs identified at this time  Colman Cater MS,RD,CSG,LDN Office: [phone] Pager: [phone]

## 2015-07-29 NOTE — Care Management Important Message (Signed)
Important Message  Patient Details  Name: Donald Gaines MRN: [id number] Date of Birth: [birth year]   Medicare Important Message Given:  Yes    Alvie Heidelberg, RN 07/29/2015, 1:14 PM

## 2015-07-29 NOTE — Care Management Note (Signed)
Case Management Note  Patient Details  Name: Donald Gaines MRN: [id number] Date of Birth: November 26, 1940  Subjective/Objective:Spoke with patient for discharge planning. Patient is from home alert and oriented from home with sister. Patient recently divorced. Gets his home O2 from [place].  Patient gets medications filled with [facility] and stated that he gets them delivered to his home and they are blister packs.  Patient stated that he no longer drives and that his sister takes him to his appointments.  Patient uses a cane to ambulate at home and denies issues with ambulation. No CM needs identified.                  Action/Plan: Home with self care.   Expected Discharge Date:                  Expected Discharge Plan:  Home/Self Care  In-House Referral:     Discharge planning Services  CM Consult  Post Acute Care Choice:  NA Choice offered to:  NA  DME Arranged:  N/A DME Agency:  [facility]  HH Arranged:    [place] Agency:     Status of Service:  Completed, signed off  Medicare Important Message Given:    Date Medicare IM Given:    Medicare IM give by:    Date Additional Medicare IM Given:    Additional Medicare Important Message give by:     If discussed at [place] of Stay Meetings, dates discussed:    Additional Comments:  Alvie Heidelberg, RN 07/29/2015, 1:05 PM

## 2015-07-30 LAB — GLUCOSE, CAPILLARY
GLUCOSE-CAPILLARY: 188 mg/dL — AB (ref 65–99)
GLUCOSE-CAPILLARY: 134 mg/dL — AB (ref 65–99)
Glucose-Capillary: 163 mg/dL — ABNORMAL HIGH (ref 65–99)
Glucose-Capillary: 144 mg/dL — ABNORMAL HIGH (ref 65–99)

## 2015-07-30 LAB — BASIC METABOLIC PANEL
Anion gap: 7 (ref 5–15)
BUN: 49 mg/dL — AB (ref 6–20)
CO2: 21 mmol/L — ABNORMAL LOW (ref 22–32)
CREATININE: 2.39 mg/dL — AB (ref 0.61–1.24)
Calcium: 8.4 mg/dL — ABNORMAL LOW (ref 8.9–10.3)
Chloride: 113 mmol/L — ABNORMAL HIGH (ref 101–111)
GFR, EST AFRICAN AMERICAN: 30 mL/min — AB (ref >60)
GFR, EST NON AFRICAN AMERICAN: 25 mL/min — AB (ref >60)
Glucose, Bld: 174 mg/dL — ABNORMAL HIGH (ref 65–99)
Potassium: 4.3 mmol/L (ref 3.5–5.1)
SODIUM: 141 mmol/L (ref 135–145)

## 2015-07-30 LAB — CULTURE, BLOOD (ROUTINE X 2)

## 2015-07-30 MED ORDER — SIMETHICONE 80 MG PO CHEW
160.0000 mg | CHEWABLE_TABLET | Freq: Four times a day (QID) | ORAL | Status: DC | PRN
  Administered 2015-07-30: 160 mg via ORAL
  Filled 2015-07-30: qty 2

## 2015-07-30 NOTE — Progress Notes (Signed)
PROGRESS NOTE    Donald Gaines  [id number] DOB: 1940-07-21 DOA: 07/27/2015 PCP: Wende Neighbors, MD     Brief Narrative:  75 year old man admitted on 5/6 with complaints of cough and shortness of breath. Believed to have a COPD exacerbation and also found to be in acute renal failure with a creatinine of 3.65. Shortness of breath has improved with treatment of his COPD, creatinine is down after IV fluids and holding nephrotoxins. On 5/8, received report of one out of 2 blood cultures with gram-positive bacteria.   Assessment & Plan:   Principal Problem:   ARF (acute renal failure) (HCC) Active Problems:   COPD exacerbation (HCC)   Type 2 diabetes mellitus ([place])   TOBACCO ABUSE   Essential hypertension, benign   Mixed hyperlipidemia   Persistent atrial fibrillation (HCC)   CKD (chronic kidney disease) stage 3, GFR 30-59 ml/min   Leukocytosis   Gram-positive bacteremia   Acute renal failure on chronic kidney disease stage III -Baseline creatinine appears to be between 1.2 and 1.3. His creatinine was 3.65 on admission and is down to 3.39 on 5/7; 2.89 on 5/8; 2.39 on 5/9. -Continue IV fluids, continue to hold diuretic and losartan.  COPD with acute exacerbation -Improved today less wheezing, continue IV steroids at current dose without titrating, nebs, empiric antibiotics. -Would benefit from addition of Spiriva to his Advair at time of discharge.  Gram-positive bacteremia -Final blood culture data shows 1 out of 2 cultures with coag negative staph. -Likely is a contaminant, will discontinue vancomycin.  Hypertension -Currently well controlled. -We will hold his losartan and Lasix due to his acute renal failure.  Hyperlipidemia -Continue statin  Atrial fibrillation -Currently rate controlled, continue Coreg, not on anticoagulation for unknown reasons.  Type 2 diabetes -Fair control, continue to adjust insulin as needed.  Tobacco abuse -Counseled on cessation,  refuses nicotine patch    DVT prophylaxis: Lovenox Code Status: Full code Family Communication: Patient only Disposition Plan: To be determined  Consultants:   None  Procedures:   None  Antimicrobials:   Levaquin   Subjective: Feels much improved, less short of breath, states he did not cough overnight.  Objective: Filed Vitals:   07/30/15 0500 07/30/15 0714 07/30/15 1332 07/30/15 1438  BP: 116/64   135/68  Pulse: 85   79  Temp: 97.7 F (36.5 C)   98 F (36.7 C)  TempSrc: Oral   Oral  Resp: 18   20  Height:      Weight:      SpO2: 98% 98% 98% 99%    Intake/Output Summary (Last 24 hours) at 07/30/15 1536 Last data filed at 07/30/15 1300  Gross per 24 hour  Intake   1595 ml  Output    700 ml  Net    895 ml   Filed Weights   07/27/15 1352  Weight: 77.4 kg (170 lb 10.2 oz)    Examination:   General exam: Alert, awake, oriented x 3 Respiratory system: Coarse bilateral breath sounds, mild bilateral expiratory wheezing Cardiovascular system:RRR. No murmurs, rubs, gallops. Gastrointestinal system: Abdomen is nondistended, soft and nontender. No organomegaly or masses felt. Normal bowel sounds heard. Central nervous system: Alert and oriented. No focal neurological deficits. Extremities: No C/C/E, +pedal pulses Skin: No rashes, lesions or ulcers Psychiatry: Judgement and insight appear normal. Mood & affect appropriate.     Data Reviewed: I have personally reviewed following labs and imaging studies  CBC:  Recent Labs Lab 07/27/15 0940 07/28/15  [id number] 07/29/15 0437  WBC 13.8* 10.5 12.7*  NEUTROABS 11.1*  --   --   HGB 12.3* 10.7* 9.8*  HCT 36.4* 32.3* 29.1*  MCV 95.0 96.4 96.4  PLT 165 150 [id number]   Basic Metabolic Panel:  Recent Labs Lab 07/27/15 0940 07/28/15 0558 07/29/15 0437 07/29/15 2020 07/30/15 1147  NA 137 138 140  --  141  K 4.6 3.9 3.9  --  4.3  CL 101 102 108  --  113*  CO2 23 26 24   --  21*  GLUCOSE 153* 180* 160*  --  174*    BUN 40* 45* 47*  --  49*  CREATININE 3.65* 3.39* 2.89*  --  2.39*  CALCIUM 9.5 8.7* 8.4*  --  8.4*  MG  --   --   --  1.9  --    GFR: Estimated Creatinine Clearance: 26.8 mL/min (by C-G formula based on Cr of 2.39). Liver Function Tests:  Recent Labs Lab 07/27/15 0940  AST 23  ALT 11*  ALKPHOS 89  BILITOT 1.0  PROT 8.1  ALBUMIN 4.2   No results for input(s): LIPASE, AMYLASE in the last 168 hours. No results for input(s): AMMONIA in the last 168 hours. Coagulation Profile: No results for input(s): INR, PROTIME in the last 168 hours. Cardiac Enzymes: No results for input(s): CKTOTAL, CKMB, CKMBINDEX, TROPONINI in the last 168 hours. BNP (last 3 results) No results for input(s): PROBNP in the last 8760 hours. HbA1C: No results for input(s): HGBA1C in the last 72 hours. CBG:  Recent Labs Lab 07/29/15 1138 07/29/15 1700 07/29/15 2102 07/30/15 0733 07/30/15 1116  GLUCAP 233* 175* 169* 163* 188*   Lipid Profile: No results for input(s): CHOL, HDL, LDLCALC, TRIG, CHOLHDL, LDLDIRECT in the last 72 hours. Thyroid Function Tests: No results for input(s): TSH, T4TOTAL, FREET4, T3FREE, THYROIDAB in the last 72 hours. Anemia Panel: No results for input(s): VITAMINB12, FOLATE, FERRITIN, TIBC, IRON, RETICCTPCT in the last 72 hours. Urine analysis:    Component Value Date/Time   COLORURINE YELLOW 07/27/2015 1020   APPEARANCEUR CLEAR 07/27/2015 1020   LABSPEC 1.010 07/27/2015 1020   PHURINE 6.0 07/27/2015 1020   GLUCOSEU NEGATIVE 07/27/2015 1020   HGBUR MODERATE* 07/27/2015 1020   BILIRUBINUR NEGATIVE 07/27/2015 1020   KETONESUR NEGATIVE 07/27/2015 1020   PROTEINUR 100* 07/27/2015 1020   UROBILINOGEN 1.0 12/25/2014 2210   NITRITE NEGATIVE 07/27/2015 1020   LEUKOCYTESUR NEGATIVE 07/27/2015 1020   Sepsis Labs: @LABRCNTIP (procalcitonin:4,lacticidven:4)  ) Recent Results (from the past 240 hour(s))  Culture, blood (Routine X 2) w Reflex to ID Panel     Status: None  (Preliminary result)   Collection Time: 07/27/15 10:20 AM  Result Value Ref Range Status   Specimen Description BLOOD LEFT HAND  Final   Special Requests BOTTLES DRAWN AEROBIC AND ANAEROBIC 6CC  Final   Culture NO GROWTH 3 DAYS  Final   Report Status PENDING  Incomplete  Culture, blood (Routine X 2) w Reflex to ID Panel     Status: Abnormal   Collection Time: 07/27/15 10:25 AM  Result Value Ref Range Status   Specimen Description BLOOD RIGHT HAND  Final   Special Requests BOTTLES DRAWN AEROBIC AND ANAEROBIC 6CC  Final   Culture  Setup Time   Final    GRAM POSITIVE COCCI IN CLUSTERS AEROBIC BOTTLE ONLY CRITICAL RESULT CALLED TO, READ BACK BY AND VERIFIED WITH: THOMAS,K @1943  ON 07/28/15 BY WOODIE,J Performed at [facility]    Culture (A)  Final    STAPHYLOCOCCUS SPECIES (COAGULASE NEGATIVE) THE SIGNIFICANCE OF ISOLATING THIS ORGANISM FROM A SINGLE SET OF BLOOD CULTURES WHEN MULTIPLE SETS ARE DRAWN IS UNCERTAIN. PLEASE NOTIFY THE MICROBIOLOGY DEPARTMENT WITHIN ONE WEEK IF SPECIATION AND SENSITIVITIES ARE REQUIRED. Performed at [facility]    Report Status 07/30/2015 FINAL  Final         Radiology Studies: No results found.      Scheduled Meds: . carvedilol  12.5 mg Oral BID WC  . citalopram  20 mg Oral Daily  . clopidogrel  75 mg Oral q morning - 10a  . enoxaparin (LOVENOX) injection  30 mg Subcutaneous Q24H  . ferrous sulfate  325 mg Oral Q breakfast  . gabapentin  100 mg Oral QHS  . insulin aspart  0-15 Units Subcutaneous TID WC  . insulin aspart  0-5 Units Subcutaneous QHS  . insulin aspart  4 Units Subcutaneous TID WC  . ipratropium-albuterol  3 mL Nebulization TID  . isosorbide mononitrate  60 mg Oral Daily  . levofloxacin  500 mg Oral Q48H  . loratadine  10 mg Oral Daily  . methylPREDNISolone (SOLU-MEDROL) injection  60 mg Intravenous Q12H  . mometasone-formoterol  2 puff Inhalation BID  . pantoprazole  40 mg Oral Daily  . potassium chloride SA   20 mEq Oral BID  . rosuvastatin  20 mg Oral Daily  . vancomycin  1,000 mg Intravenous Q48H   Continuous Infusions: . sodium chloride 75 mL/hr at 07/29/15 1612     LOS: 3 days    Time spent: 25 minutes. Greater than 50% of this time was spent in direct contact with the patient coordinating care.     Lelon Frohlich, MD [facility] Pager [phone]  If 7PM-7AM, please contact night-coverage [email or web address] Password [email or web address] 07/30/2015, 3:36 PM

## 2015-07-31 LAB — BASIC METABOLIC PANEL
Anion gap: 8 (ref 5–15)
BUN: 48 mg/dL — ABNORMAL HIGH (ref 6–20)
CO2: 19 mmol/L — AB (ref 22–32)
CREATININE: 2.3 mg/dL — AB (ref 0.61–1.24)
Calcium: 8.5 mg/dL — ABNORMAL LOW (ref 8.9–10.3)
Chloride: 115 mmol/L — ABNORMAL HIGH (ref 101–111)
GFR calc Af Amer: 31 mL/min — ABNORMAL LOW (ref >60)
GFR calc non Af Amer: 27 mL/min — ABNORMAL LOW (ref >60)
GLUCOSE: 158 mg/dL — AB (ref 65–99)
Potassium: 4.5 mmol/L (ref 3.5–5.1)
Sodium: 142 mmol/L (ref 135–145)

## 2015-07-31 LAB — GLUCOSE, CAPILLARY
GLUCOSE-CAPILLARY: 214 mg/dL — AB (ref 65–99)
GLUCOSE-CAPILLARY: 107 mg/dL — AB (ref 65–99)
GLUCOSE-CAPILLARY: 133 mg/dL — AB (ref 65–99)
Glucose-Capillary: 166 mg/dL — ABNORMAL HIGH (ref 65–99)

## 2015-07-31 MED ORDER — PREDNISONE 20 MG PO TABS
40.0000 mg | ORAL_TABLET | Freq: Every day | ORAL | Status: DC
  Administered 2015-08-01: 40 mg via ORAL
  Filled 2015-07-31: qty 2

## 2015-07-31 NOTE — Progress Notes (Signed)
[facility] PROGRESS NOTE  Donald Gaines [id number] DOB: 1940/04/27    PCP:   Wende Neighbors, MD   HPI: Donald Gaines is an 75 y.o. male with hx of TIA, GERD, HLD, DM, HTN, anemia, admitted for COPD exacerbation and AKI.  His baseline Cr 7 months ago was 1.3.  His admitting Cr was 3.6, and has improved to 2.3 with IVF and avoidance of nephrotoxic agents.  He also was found to have COPD exacerbation, and was given nebs and steroids.  One of his Blood cultures returned positive for GPC, but it was felt that it was a contaminant (CNS), and subsequently, his Vancomycin was discontinued. He has no other complaints.   Rewiew of Systems:  Constitutional: Negative for malaise, fever and chills. No significant weight loss or weight gain Eyes: Negative for eye pain, redness and discharge, diplopia, visual changes, or flashes of light. ENMT: Negative for ear pain, hoarseness, nasal congestion, sinus pressure and sore throat. No headaches; tinnitus, drooling, or problem swallowing. Cardiovascular: Negative for chest pain, palpitations, diaphoresis, dyspnea and peripheral edema. ; No orthopnea, PND Respiratory: Negative for cough, hemoptysis, wheezing and stridor. No pleuritic chestpain. Gastrointestinal: Negative for nausea, vomiting, diarrhea, constipation, abdominal pain, melena, blood in stool, hematemesis, jaundice and rectal bleeding.    Genitourinary: Negative for frequency, dysuria, incontinence,flank pain and hematuria; Musculoskeletal: Negative for back pain and neck pain. Negative for swelling and trauma.;  Skin: . Negative for pruritus, rash, abrasions, bruising and skin lesion.; ulcerations Neuro: Negative for headache, lightheadedness and neck stiffness. Negative for weakness, altered level of consciousness , altered mental status, extremity weakness, burning feet, involuntary movement, seizure and syncope.  Psych: negative for anxiety, depression, insomnia, tearfulness, panic  attacks, hallucinations, paranoia, suicidal or homicidal ideation    Past Medical History  Diagnosis Date  . TIA (transient ischemic attack)     2005  . GERD (gastroesophageal reflux disease)   . Hyperlipidemia   . Essential hypertension, benign   . Depression   . Diabetes mellitus, type 2 ([place])   . Restrictive lung disease   . Candida esophagitis ([place])   . Hemorrhoids   . Arteriovenous malformation small bowel     Capsule study 3/10  . GI bleeding     Cecal ulcers, arteriovenous malformations  . Chronic diarrhea SEP 2011    ?ETIOLOGY-DIABETIC ENTEROPATHY, LACTOSE INTOLERANCE,  OR IBS-MIXED  . Bloating AUG 2009    NL HBT FOR SIBO  . Anemia FEB 2008 FEDA 2o to AVMs, & large colon ulcers    HB 10.7 MCV 74.3; Dr Tressie Stalker  . BPH (benign prostatic hypertrophy)     Dr Maryland Pink  . Glaucoma   . Cataract   . Bilateral carotid artery stenosis     50-75% external left carotid stenosis  . Coronary atherosclerosis of native coronary artery     Multivessel, BMS SVG TO RCA 2000, reportedly  "2" additional stents in interim  . Myocardial infarction ([place])     1994  . COPD (chronic obstructive pulmonary disease) ([place])   . Sleep apnea   . Portal hypertensive gastropathy 11/08/2012    EGD. Dr. Laural Golden  . Dementia     Past Surgical History  Procedure Laterality Date  . Esophagogastroduodenoscopy  SEP 09    DUODENAL LIPOMA  . Capsule endo  03/10  . Right inguinal hernia repair    . Hydrogen breath test  2009  . Colonoscopy  APR 2008    SIMPLE ADENOMA, [place] TICS, IH  . Colonoscopy  FEB 2008 ANEMIA. MELENA     2 LARGE ILEOCEAL ULCERS 2o to ASA, [place] TICS, IH  . Colonoscopy  SEP 2009 TRANSFUSION DEP ANEMIA    AC AVM-ABLATED, [place] TICS, IH  . Upper gastrointestinal endoscopy  SEP 2009    GASTRIC AVM ABLATED, NL DUO Bx  . Cardiac catherization  04/2012  . Cardiac catheterization  04/05/1996 [facility]    EF 45%, occluded SVG to circumflex, patent SVG to D1 and PDA and patent LIMA to LAD  with severe native vessel disease.  . Cardiac catheterization  08/22/1998 [facility]    Mild LM, severe LAD,severe CX, occlude RCA, patient  SVG to diatal RCA, patent vein graft to D1 and patent LIMA to LAD.   Marland Kitchen Coronary angioplasty with stent placement  08/22/1998 [name]    TEC stenting of the SVG to RCA-Last seen by cardiologist in 2010.  Marland Kitchen Coronary artery bypass graft  1995-triple bypass    LIMA-LAD, LIMA to intermediate branch, SVG to PD and second OM,  . Cataract extraction w/phaco Right 09/22/2012    Procedure: CATARACT EXTRACTION PHACO AND INTRAOCULAR LENS PLACEMENT (IOC);  Surgeon: Tonny Branch, MD;  Location: [facility] OR[facility];  Service: Ophthalmology;  Laterality: Right;  CDE: 11.43  . Cataract extraction w/phaco Left 10/17/2012    Procedure: CATARACT EXTRACTION PHACO AND INTRAOCULAR LENS PLACEMENT (IOC);  Surgeon: Tonny Branch, MD;  Location: [facility];  Service: Ophthalmology;  Laterality: Left;  CDE: 8.06  . Colonoscopy with esophagogastroduodenoscopy (egd) N/A 11/08/2012    Procedure: COLONOSCOPY WITH ESOPHAGOGASTRODUODENOSCOPY (EGD);  Surgeon: Rogene Houston, MD;  Location: [facility];  Service: Endoscopy;  Laterality: N/A;  250-rescheduled to 7:30 Ann to notify pt  . [place]ivens capsule study N/A 11/24/2012    Procedure: GIVENS CAPSULE STUDY;  Surgeon: Rogene Houston, MD;  Location: [facility];  Service: Endoscopy;  Laterality: N/A;  730  . Esophagogastroduodenoscopy N/A 09/27/2013    Procedure: ESOPHAGOGASTRODUODENOSCOPY (EGD) Push enteroscopy;  Surgeon: Rogene Houston, MD;  Location: [facility];  Service: Endoscopy;  Laterality: N/A;  . Hot hemostasis  09/27/2013    Procedure: HOT HEMOSTASIS (ARGON PLASMA COAGULATION/BICAP);  Surgeon: Rogene Houston, MD;  Location: [facility];  Service: Endoscopy;;    Medications:  HOME MEDS: Prior to Admission medications   Medication Sig Start Date End Date Taking? Authorizing Provider  ADVAIR DISKUS 250-50 MCG/DOSE AEPB Inhale 1 puff into the  lungs 2 (two) times daily.  10/02/10  Yes Historical Provider, MD  albuterol (PROAIR HFA) 108 (90 BASE) MCG/ACT inhaler Inhale 2 puffs into the lungs every 6 (six) hours as needed for wheezing or shortness of breath.   Yes Historical Provider, MD  ALPRAZolam Duanne Moron) 0.5 MG tablet Take 0.5 mg by mouth 2 (two) times daily as needed for anxiety.    Yes Historical Provider, MD  carvedilol (COREG) 12.5 MG tablet Take 12.5 mg by mouth 2 (two) times daily with a meal.  03/26/14  Yes Historical Provider, MD  cetirizine (ZYRTEC) 10 MG tablet Take 10 mg by mouth daily.   Yes Historical Provider, MD  clopidogrel (PLAVIX) 75 MG tablet Take 1 tablet (75 mg total) by mouth every morning. 09/27/13  Yes Nita Sells, MD  escitalopram (LEXAPRO) 5 MG tablet Take 5 mg by mouth daily.  04/16/15  Yes Historical Provider, MD  ferrous sulfate 325 (65 FE) MG tablet Take 325 mg by mouth daily with breakfast.   Yes Historical Provider, MD  furosemide (LASIX) 40 MG tablet  Take 40 mg by mouth 2 (two) times daily.    Yes Historical Provider, MD  gabapentin (NEURONTIN) 100 MG capsule Take 100 mg by mouth at bedtime.  09/20/14  Yes Historical Provider, MD  isosorbide mononitrate (IMDUR) 60 MG 24 hr tablet Take 1 tablet (60 mg total) by mouth daily. 06/01/13  Yes Lendon Colonel, NP  JANUMET 50-1000 MG per tablet Take 1 tablet by mouth 2 (two) times daily with a meal.  07/22/13  Yes Historical Provider, MD  losartan (COZAAR) 25 MG tablet Take 1 tablet (25 mg total) by mouth daily. 12/04/13  Yes Satira Sark, MD  NITROSTAT 0.4 MG SL tablet Place 0.4 mg under the tongue every 5 (five) minutes as needed for chest pain.  05/23/10  Yes Historical Provider, MD  omeprazole (PRILOSEC) 20 MG capsule Take 20 mg by mouth daily.  08/28/13  Yes Historical Provider, MD  potassium chloride SA (K-DUR,KLOR-CON) 20 MEQ tablet Take 20 mEq by mouth 2 (two) times daily.  04/16/15  Yes Historical Provider, MD  rosuvastatin (CRESTOR) 20 MG tablet Take 1  tablet (20 mg total) by mouth daily. 12/12/13  Yes Satira Sark, MD     Allergies:  Allergies  Allergen Reactions  . Penicillins Rash    Has patient had a PCN reaction causing immediate rash, facial/tongue/throat swelling, SOB or lightheadedness with hypotension: Yes Has patient had a PCN reaction causing severe rash involving mucus membranes or skin necrosis: No Has patient had a PCN reaction that required hospitalization No Has patient had a PCN reaction occurring within the last 10 years: No If all of the above answers are "NO", then may proceed with Cephalosporin use.     Social History:   reports that he has been smoking Cigarettes.  He has a 60 pack-year smoking history. He has never used smokeless tobacco. He reports that he uses illicit drugs (Marijuana). He reports that he does not drink alcohol.  Family History: Family History  Problem Relation Age of Onset  . Heart attack Mother   . Heart disease Mother   . Hypertension Mother      Physical Exam: Filed Vitals:   07/31/15 0840 07/31/15 0852 07/31/15 1430 07/31/15 1532  BP:    138/88  Pulse:    88  Temp:    97.7 F (36.5 C)  TempSrc:    Oral  Resp:    20  Height:      Weight:      SpO2: 92% 98% 96% 95%   Blood pressure 138/88, pulse 88, temperature 97.7 F (36.5 C), temperature source Oral, resp. rate 20, height 5\' 5"  (1.651 m), weight 77.4 kg (170 lb 10.2 oz), SpO2 95 %.  GEN:  Pleasant patient lying in the stretcher in no acute distress; cooperative with exam. PSYCH:  alert and oriented x4; does not appear anxious or depressed; affect is appropriate. HEENT: Mucous membranes pink and anicteric; PERRLA; EOM intact; no cervical lymphadenopathy nor thyromegaly or carotid bruit; no JVD; There were no stridor. Neck is very supple. Breasts:: Not examined CHEST WALL: No tenderness CHEST: Normal respiration, wheezing is minimal.  HEART: Regular rate and rhythm.  There are no murmur, rub, or gallops.   BACK: No  kyphosis or scoliosis; no CVA tenderness ABDOMEN: soft and non-tender; no masses, no organomegaly, normal abdominal bowel sounds; no pannus; no intertriginous candida. There is no rebound and no distention. Rectal Exam: Not done EXTREMITIES: No bone or joint deformity; age-appropriate arthropathy of the hands and  knees; no edema; no ulcerations.  There is no calf tenderness. Genitalia: not examined PULSES: 2+ and symmetric SKIN: Normal hydration no rash or ulceration CNS: Cranial nerves 2-12 grossly intact no focal lateralizing neurologic deficit.  Speech is fluent; uvula elevated with phonation, facial symmetry and tongue midline. DTR are normal bilaterally, cerebella exam is intact, barbinski is negative and strengths are equaled bilaterally.  No sensory loss.   Labs on Admission:  Basic Metabolic Panel:  Recent Labs Lab 07/27/15 0940 07/28/15 0558 07/29/15 0437 07/29/15 2020 07/30/15 1147 07/31/15 0525  NA 137 138 140  --  141 142  K 4.6 3.9 3.9  --  4.3 4.5  CL 101 102 108  --  113* 115*  CO2 23 26 24   --  21* 19*  GLUCOSE 153* 180* 160*  --  174* 158*  BUN 40* 45* 47*  --  49* 48*  CREATININE 3.65* 3.39* 2.89*  --  2.39* 2.30*  CALCIUM 9.5 8.7* 8.4*  --  8.4* 8.5*  MG  --   --   --  1.9  --   --    Liver Function Tests:  Recent Labs Lab 07/27/15 0940  AST 23  ALT 11*  ALKPHOS 89  BILITOT 1.0  PROT 8.1  ALBUMIN 4.2   CBC:  Recent Labs Lab 07/27/15 0940 07/28/15 0558 07/29/15 0437  WBC 13.8* 10.5 12.7*  NEUTROABS 11.1*  --   --   HGB 12.3* 10.7* 9.8*  HCT 36.4* 32.3* 29.1*  MCV 95.0 96.4 96.4  PLT 165 150 150   CBG:  Recent Labs Lab 07/30/15 1116 07/30/15 1659 07/30/15 2039 07/31/15 0805 07/31/15 1133  GLUCAP 188* 134* 144* 166* 214*   Assessment/Plan Present on Admission:  . ARF (acute renal failure) ([place]) . TOBACCO ABUSE . Mixed hyperlipidemia . Essential hypertension, benign . Persistent atrial fibrillation (HCC)  PLAN:  AKI:   Improving steadily.  Will continue with IVF and followed Cr.  Vancomycin discontinuation should help with recovery of his renal Fx as well.   COPD Exacerbation:  Doing better now.  Will transition him to oral Prednisone.  Continue with nebs.  HLD:  Continue with statin.;  HTN:  BP is in acceptable ranges.  Will continue with Coreg.   Other plans as per orders. Code Status: FULL Haskel Khan, MD.  FACP [facility] Pager [phone] 7pm to 7am.  07/31/2015, 3:44 PM

## 2015-08-01 DIAGNOSIS — J441 Chronic obstructive pulmonary disease with (acute) exacerbation: Secondary | ICD-10-CM

## 2015-08-01 DIAGNOSIS — N183 Chronic kidney disease, stage 3 (moderate): Secondary | ICD-10-CM

## 2015-08-01 LAB — BASIC METABOLIC PANEL
Anion gap: 6 (ref 5–15)
BUN: 50 mg/dL — ABNORMAL HIGH (ref 6–20)
CHLORIDE: 115 mmol/L — AB (ref 101–111)
CO2: 20 mmol/L — ABNORMAL LOW (ref 22–32)
CREATININE: 2.17 mg/dL — AB (ref 0.61–1.24)
Calcium: 8.3 mg/dL — ABNORMAL LOW (ref 8.9–10.3)
GFR, EST AFRICAN AMERICAN: 33 mL/min — AB (ref >60)
GFR, EST NON AFRICAN AMERICAN: 29 mL/min — AB (ref >60)
Glucose, Bld: 118 mg/dL — ABNORMAL HIGH (ref 65–99)
POTASSIUM: 4.4 mmol/L (ref 3.5–5.1)
SODIUM: 141 mmol/L (ref 135–145)

## 2015-08-01 LAB — GLUCOSE, CAPILLARY
GLUCOSE-CAPILLARY: 128 mg/dL — AB (ref 65–99)
GLUCOSE-CAPILLARY: 161 mg/dL — AB (ref 65–99)
Glucose-Capillary: 130 mg/dL — ABNORMAL HIGH (ref 65–99)
Glucose-Capillary: 191 mg/dL — ABNORMAL HIGH (ref 65–99)

## 2015-08-01 LAB — CULTURE, BLOOD (ROUTINE X 2): Culture: NO GROWTH

## 2015-08-01 MED ORDER — PREDNISONE 20 MG PO TABS
20.0000 mg | ORAL_TABLET | Freq: Every day | ORAL | Status: DC
  Administered 2015-08-02: 20 mg via ORAL
  Filled 2015-08-01: qty 1

## 2015-08-01 NOTE — Progress Notes (Signed)
[facility] PROGRESS NOTE  Donald Gaines [id number] DOB: 1941/02/26    PCP:   Wende Neighbors, MD   HPI:  Donald Gaines is an 75 y.o. male with hx of TIA, GERD, HLD, DM, HTN, anemia, admitted for COPD exacerbation and AKI. His baseline Cr 7 months ago was 1.3. His admitting Cr was 3.6, and has improved to 2.17 with IVF and avoidance of nephrotoxic agents. He also was found to have COPD exacerbation, and was given nebs and steroids. One of his Blood cultures returned positive for GPC, but it was felt that it was a contaminant (CNS), and subsequently, his Vancomycin was discontinued. He has no other complaints. He was given a new nebulizer, and has been ambulating.  He is anxious to go home.   Rewiew of Systems:  Constitutional: Negative for malaise, fever and chills. No significant weight loss or weight gain Eyes: Negative for eye pain, redness and discharge, diplopia, visual changes, or flashes of light. ENMT: Negative for ear pain, hoarseness, nasal congestion, sinus pressure and sore throat. No headaches; tinnitus, drooling, or problem swallowing. Cardiovascular: Negative for chest pain, palpitations, diaphoresis, dyspnea and peripheral edema. ; No orthopnea, PND Respiratory: Negative for cough, hemoptysis, wheezing and stridor. No pleuritic chestpain. Gastrointestinal: Negative for nausea, vomiting, diarrhea, constipation, abdominal pain, melena, blood in stool, hematemesis, jaundice and rectal bleeding.    Genitourinary: Negative for frequency, dysuria, incontinence,flank pain and hematuria; Musculoskeletal: Negative for back pain and neck pain. Negative for swelling and trauma.;  Skin: . Negative for pruritus, rash, abrasions, bruising and skin lesion.; ulcerations Neuro: Negative for headache, lightheadedness and neck stiffness. Negative for weakness, altered level of consciousness , altered mental status, extremity weakness, burning feet, involuntary movement, seizure and  syncope.  Psych: negative for anxiety, depression, insomnia, tearfulness, panic attacks, hallucinations, paranoia, suicidal or homicidal ideation    Past Medical History  Diagnosis Date  . TIA (transient ischemic attack)     2005  . GERD (gastroesophageal reflux disease)   . Hyperlipidemia   . Essential hypertension, benign   . Depression   . Diabetes mellitus, type 2 ([place])   . Restrictive lung disease   . Candida esophagitis ([place])   . Hemorrhoids   . Arteriovenous malformation small bowel     Capsule study 3/10  . GI bleeding     Cecal ulcers, arteriovenous malformations  . Chronic diarrhea SEP 2011    ?ETIOLOGY-DIABETIC ENTEROPATHY, LACTOSE INTOLERANCE,  OR IBS-MIXED  . Bloating AUG 2009    NL HBT FOR SIBO  . Anemia FEB 2008 FEDA 2o to AVMs, & large colon ulcers    HB 10.7 MCV 74.3; Dr Tressie Stalker  . BPH (benign prostatic hypertrophy)     Dr [place] Pink  . Glaucoma   . Cataract   . Bilateral carotid artery stenosis     50-75% external left carotid stenosis  . Coronary atherosclerosis of native coronary artery     Multivessel, BMS SVG TO RCA 2000, reportedly  "2" additional stents in interim  . Myocardial infarction ([place])     1994  . COPD (chronic obstructive pulmonary disease) ([place])   . Sleep apnea   . Portal hypertensive gastropathy 11/08/2012    EGD. Dr. Laural Golden  . Dementia     Past Surgical History  Procedure Laterality Date  . Esophagogastroduodenoscopy  SEP 09    DUODENAL LIPOMA  . Capsule endo  03/10  . Right inguinal hernia repair    . Hydrogen breath test  2009  . Colonoscopy  APR 2008    SIMPLE ADENOMA, [place] TICS, IH  . Colonoscopy  FEB 2008 ANEMIA. MELENA     2 LARGE ILEOCEAL ULCERS 2o to ASA, [place] TICS, IH  . Colonoscopy  SEP 2009 TRANSFUSION DEP ANEMIA    AC AVM-ABLATED, [place] TICS, IH  . Upper gastrointestinal endoscopy  SEP 2009    GASTRIC AVM ABLATED, NL DUO Bx  . Cardiac catherization  04/2012  . Cardiac catheterization  04/05/1996 [facility]    EF  45%, occluded SVG to circumflex, patent SVG to D1 and PDA and patent LIMA to LAD with severe native vessel disease.  . Cardiac catheterization  08/22/1998 [facility]    Mild LM, severe LAD,severe CX, occlude RCA, patient  SVG to diatal RCA, patent vein graft to D1 and patent LIMA to LAD.   Marland Kitchen Coronary angioplasty with stent placement  08/22/1998 [name]    TEC stenting of the SVG to RCA-Last seen by cardiologist in 2010.  Marland Kitchen Coronary artery bypass graft  1995-triple bypass    LIMA-LAD, LIMA to intermediate branch, SVG to PD and second OM,  . Cataract extraction w/phaco Right 09/22/2012    Procedure: CATARACT EXTRACTION PHACO AND INTRAOCULAR LENS PLACEMENT (IOC);  Surgeon: Tonny Branch, MD;  Location: [facility];  Service: Ophthalmology;  Laterality: Right;  CDE: 11.43  . Cataract extraction w/phaco Left 10/17/2012    Procedure: CATARACT EXTRACTION PHACO AND INTRAOCULAR LENS PLACEMENT (IOC);  Surgeon: Tonny Branch, MD;  Location: [facility];  Service: Ophthalmology;  Laterality: Left;  CDE: 8.06  . Colonoscopy with esophagogastroduodenoscopy (egd) N/A 11/08/2012    Procedure: COLONOSCOPY WITH ESOPHAGOGASTRODUODENOSCOPY (EGD);  Surgeon: Rogene Houston, MD;  Location: [facility];  Service: Endoscopy;  Laterality: N/A;  250-rescheduled to 7:30 Ann to notify pt  . [place] capsule study N/A 11/24/2012    Procedure: GIVENS CAPSULE STUDY;  Surgeon: Rogene Houston, MD;  Location: [facility];  Service: Endoscopy;  Laterality: N/A;  730  . Esophagogastroduodenoscopy N/A 09/27/2013    Procedure: ESOPHAGOGASTRODUODENOSCOPY (EGD) Push enteroscopy;  Surgeon: Rogene Houston, MD;  Location: [facility];  Service: Endoscopy;  Laterality: N/A;  . Hot hemostasis  09/27/2013    Procedure: HOT HEMOSTASIS (ARGON PLASMA COAGULATION/BICAP);  Surgeon: Rogene Houston, MD;  Location: [facility];  Service: Endoscopy;;    Medications:  HOME MEDS: Prior to Admission medications   Medication Sig Start Date End Date Taking?  Authorizing Provider  ADVAIR DISKUS 250-50 MCG/DOSE AEPB Inhale 1 puff into the lungs 2 (two) times daily.  10/02/10  Yes Historical Provider, MD  albuterol (PROAIR HFA) 108 (90 BASE) MCG/ACT inhaler Inhale 2 puffs into the lungs every 6 (six) hours as needed for wheezing or shortness of breath.   Yes Historical Provider, MD  ALPRAZolam Duanne Moron) 0.5 MG tablet Take 0.5 mg by mouth 2 (two) times daily as needed for anxiety.    Yes Historical Provider, MD  carvedilol (COREG) 12.5 MG tablet Take 12.5 mg by mouth 2 (two) times daily with a meal.  03/26/14  Yes Historical Provider, MD  cetirizine (ZYRTEC) 10 MG tablet Take 10 mg by mouth daily.   Yes Historical Provider, MD  clopidogrel (PLAVIX) 75 MG tablet Take 1 tablet (75 mg total) by mouth every morning. 09/27/13  Yes Nita Sells, MD  escitalopram (LEXAPRO) 5 MG tablet Take 5 mg by mouth daily.  04/16/15  Yes Historical Provider, MD  ferrous sulfate 325 (65 FE) MG tablet Take 325 mg by mouth daily  with breakfast.   Yes Historical Provider, MD  furosemide (LASIX) 40 MG tablet Take 40 mg by mouth 2 (two) times daily.    Yes Historical Provider, MD  gabapentin (NEURONTIN) 100 MG capsule Take 100 mg by mouth at bedtime.  09/20/14  Yes Historical Provider, MD  isosorbide mononitrate (IMDUR) 60 MG 24 hr tablet Take 1 tablet (60 mg total) by mouth daily. 06/01/13  Yes Lendon Colonel, NP  JANUMET 50-1000 MG per tablet Take 1 tablet by mouth 2 (two) times daily with a meal.  07/22/13  Yes Historical Provider, MD  losartan (COZAAR) 25 MG tablet Take 1 tablet (25 mg total) by mouth daily. 12/04/13  Yes Satira Sark, MD  NITROSTAT 0.4 MG SL tablet Place 0.4 mg under the tongue every 5 (five) minutes as needed for chest pain.  05/23/10  Yes Historical Provider, MD  omeprazole (PRILOSEC) 20 MG capsule Take 20 mg by mouth daily.  08/28/13  Yes Historical Provider, MD  potassium chloride SA (K-DUR,KLOR-CON) 20 MEQ tablet Take 20 mEq by mouth 2 (two) times daily.   04/16/15  Yes Historical Provider, MD  rosuvastatin (CRESTOR) 20 MG tablet Take 1 tablet (20 mg total) by mouth daily. 12/12/13  Yes Satira Sark, MD     Allergies:  Allergies  Allergen Reactions  . Penicillins Rash    Has patient had a PCN reaction causing immediate rash, facial/tongue/throat swelling, SOB or lightheadedness with hypotension: Yes Has patient had a PCN reaction causing severe rash involving mucus membranes or skin necrosis: No Has patient had a PCN reaction that required hospitalization No Has patient had a PCN reaction occurring within the last 10 years: No If all of the above answers are "NO", then may proceed with Cephalosporin use.     Social History:   reports that he has been smoking Cigarettes.  He has a 60 pack-year smoking history. He has never used smokeless tobacco. He reports that he uses illicit drugs (Marijuana). He reports that he does not drink alcohol.  Family History: Family History  Problem Relation Age of Onset  . Heart attack Mother   . Heart disease Mother   . Hypertension Mother      Physical Exam: Filed Vitals:   08/01/15 0106 08/01/15 0613 08/01/15 1115 08/01/15 1120  BP:  145/110    Pulse:  78    Temp:  97.6 F (36.4 C)    TempSrc:  Oral    Resp:  20    Height:      Weight:      SpO2: 91% 97% 95% 95%   Blood pressure 145/110, pulse 78, temperature 97.6 F (36.4 C), temperature source Oral, resp. rate 20, height 5\' 5"  (1.651 m), weight 77.4 kg (170 lb 10.2 oz), SpO2 95 %.  GEN:  Pleasant  patient lying in the stretcher in no acute distress; cooperative with exam. PSYCH:  alert and oriented x4; does not appear anxious or depressed; affect is appropriate. HEENT: Mucous membranes pink and anicteric; PERRLA; EOM intact; no cervical lymphadenopathy nor thyromegaly or carotid bruit; no JVD; There were no stridor. Neck is very supple. Breasts:: Not examined CHEST WALL: No tenderness CHEST: Normal respiration, clear to  auscultation bilaterally.  HEART: Regular rate and rhythm.  There are no murmur, rub, or gallops.   BACK: No kyphosis or scoliosis; no CVA tenderness ABDOMEN: soft and non-tender; no masses, no organomegaly, normal abdominal bowel sounds; no pannus; no intertriginous candida. There is no rebound and no distention.  Rectal Exam: Not done EXTREMITIES: No bone or joint deformity; age-appropriate arthropathy of the hands and knees; no edema; no ulcerations.  There is no calf tenderness. Genitalia: not examined PULSES: 2+ and symmetric SKIN: Normal hydration no rash or ulceration CNS: Cranial nerves 2-12 grossly intact no focal lateralizing neurologic deficit.  Speech is fluent; uvula elevated with phonation, facial symmetry and tongue midline. DTR are normal bilaterally, cerebella exam is intact, barbinski is negative and strengths are equaled bilaterally.  No sensory loss.   Labs on Admission:  Basic Metabolic Panel:  Recent Labs Lab 07/28/15 0558 07/29/15 0437 07/29/15 2020 07/30/15 1147 07/31/15 0525 08/01/15 0521  NA 138 140  --  141 142 141  K 3.9 3.9  --  4.3 4.5 4.4  CL 102 108  --  113* 115* 115*  CO2 26 24  --  21* 19* 20*  GLUCOSE 180* 160*  --  174* 158* 118*  BUN 45* 47*  --  49* 48* 50*  CREATININE 3.39* 2.89*  --  2.39* 2.30* 2.17*  CALCIUM 8.7* 8.4*  --  8.4* 8.5* 8.3*  MG  --   --  1.9  --   --   --    Liver Function Tests:  Recent Labs Lab 07/27/15 0940  AST 23  ALT 11*  ALKPHOS 89  BILITOT 1.0  PROT 8.1  ALBUMIN 4.2    CBC:  Recent Labs Lab 07/27/15 0940 07/28/15 0558 07/29/15 0437  WBC 13.8* 10.5 12.7*  NEUTROABS 11.1*  --   --   HGB 12.3* 10.7* 9.8*  HCT 36.4* 32.3* 29.1*  MCV 95.0 96.4 96.4  PLT 165 150 150   Cardiac Enzymes: No results for input(s): CKTOTAL, CKMB, CKMBINDEX, TROPONINI in the last 168 hours.  CBG:  Recent Labs Lab 07/31/15 1133 07/31/15 1616 07/31/15 2119 08/01/15 0806 08/01/15 1155  GLUCAP 214* 107* 133* 128*  130*    Assessment/Plan Present on Admission:  . ARF (acute renal failure) ([place]) . TOBACCO ABUSE . Mixed hyperlipidemia . Essential hypertension, benign . Persistent atrial fibrillation (HCC)   AKI: Improving steadily. Will continue with IVF and followed Cr. It has improved to 2.17 Vancomycin discontinuation should help with recovery of his renal Fx as well.   COPD Exacerbation: Doing better now. Will transition him to oral Prednisone. Will taper to 20mg  per day.  Continue with nebs.  HLD: Continue with statin.;  HTN: BP is in acceptable ranges. Will continue with Coreg.     Other plans as per orders. Code Status: FULL Haskel Khan, MD.  FACP [facility] Pager [phone] 7pm to 7am.  08/01/2015, 2:55 PM

## 2015-08-01 NOTE — Progress Notes (Signed)
Pt short of breath and wheezing, O2 sats in mid 80's.Marland Kitchen Respiratory Therapist in to given Breathing tx. , pt continues to be short of breath. Placed on High flow O2 at 6L. Xanax 0.5mg  given PO.

## 2015-08-02 DIAGNOSIS — I10 Essential (primary) hypertension: Secondary | ICD-10-CM

## 2015-08-02 LAB — BASIC METABOLIC PANEL
ANION GAP: 5 (ref 5–15)
BUN: 50 mg/dL — AB (ref 6–20)
CALCIUM: 8.3 mg/dL — AB (ref 8.9–10.3)
CO2: 20 mmol/L — AB (ref 22–32)
Chloride: 114 mmol/L — ABNORMAL HIGH (ref 101–111)
Creatinine, Ser: 2.24 mg/dL — ABNORMAL HIGH (ref 0.61–1.24)
GFR calc Af Amer: 32 mL/min — ABNORMAL LOW (ref >60)
GFR calc non Af Amer: 28 mL/min — ABNORMAL LOW (ref >60)
GLUCOSE: 129 mg/dL — AB (ref 65–99)
Potassium: 3.9 mmol/L (ref 3.5–5.1)
Sodium: 139 mmol/L (ref 135–145)

## 2015-08-02 LAB — GLUCOSE, CAPILLARY
GLUCOSE-CAPILLARY: 129 mg/dL — AB (ref 65–99)
Glucose-Capillary: 130 mg/dL — ABNORMAL HIGH (ref 65–99)

## 2015-08-02 MED ORDER — ALBUTEROL SULFATE (2.5 MG/3ML) 0.083% IN NEBU: 2.5000 mg | INHALATION_SOLUTION | RESPIRATORY_TRACT | Status: DC | PRN

## 2015-08-02 MED ORDER — ENSURE ENLIVE PO LIQD: 237.0000 mL | ORAL | Status: DC | PRN

## 2015-08-02 NOTE — Care Management Important Message (Signed)
Important Message  Patient Details  Name: MERLAND ALDANA MRN: [id number] Date of Birth: Aug 08, 1940   Medicare Important Message Given:  Yes    Alvie Heidelberg, RN 08/02/2015, 9:44 AM

## 2015-08-02 NOTE — Discharge Summary (Signed)
Physician Discharge Summary  Donald Gaines [id number] DOB: [birth year] DOA: 07/27/2015  PCP: Wende Neighbors, MD  Admit date: 07/27/2015 Discharge date: 08/02/2015  Time spent: 35 minutes  Recommendations for Outpatient Follow-up:  1. Follow up with PCP next week.   2. Follow up with Dr Chilton Greathouse in 2-4 weeks.    Discharge Diagnoses:  Principal Problem:   ARF (acute renal failure) (HCC) Active Problems:   Type 2 diabetes mellitus (HCC)   TOBACCO ABUSE   Essential hypertension, benign   Mixed hyperlipidemia   Persistent atrial fibrillation (HCC)   CKD (chronic kidney disease) stage 3, GFR 30-59 ml/min   Leukocytosis   COPD exacerbation (HCC)   Gram-positive bacteremia   Discharge Condition: Improved.  Cr improved to 2.2 and did plateau.  No wheezing.   Diet recommendation: Carb modified diet.   Filed Weights   07/27/15 1352  Weight: 77.4 kg (170 lb 10.2 oz)    History of present illness: Patient was admitted into the hospital by Dr Jerilee Hoh on Jul 27, 2015 for COPD exacerbation and AKI.   As per her H and P:  " Donald Gaines is a 75 y.o. male with history of persistent atrial fibrillation, hypertension, hyperlipidemia, type 2 diabetes, GERD, COPD who presents to the hospital today with the above complaints. He states his shortness of breath began about 48 hours ago and has progressively gotten worse. He feels like he may have an upper respiratory infection with runny nose and sore throat and lots of nonproductive cough. He states he has had intermittent fevers but is unable to quantify his temperature and he is not febrile upon admission. He is also noted to have acute renal failure with a creatinine of 3.65, a white blood cell count of 13.8, chest x-ray without edema or consolidation. We have been asked to admit him for further evaluation and management.   Hospital Course: Donald Gaines is an 75 y.o. male with hx of TIA, GERD, HLD, DM, HTN, anemia, admitted for COPD  exacerbation and AKI. His baseline Cr 7 months ago was 1.3. His admitting Cr was 3.6, and has improved to about  2.2  with IVF and avoidance of nephrotoxic agents. This included diuretics and ACE inbitors.  After the initial improvement, it reached plateau. He was not continued on diuretics nor ACE I upon discharge.   He also was found to have COPD exacerbation, and was given nebs and steroids. One of his Blood cultures returned positive for GPC, but it was felt that it was a contaminant (CNS), and subsequently, his Vancomycin was discontinued.  With discontinuation of Vancomycin, it was hoped that his kidney function will continue to improve.  He has no other complaints. He was given a new nebulizer as his sister said his previous one was broken.  He started to ambulate well and is feeling better.  His steroid was quickly tapered to oral, and on discharge, he did not require any further prednisone.  He was recommended to follow up with his PCP in one week, and he also needs to see Dr Leary Roca as I suspect he has developed CKD III now due to HTN and diabetic nephropathy.  His sister has quit her job and she will be taken care of him.  It was a pleasure taking care of your patient.  Thank you and Good Day.    Discharge Exam: Filed Vitals:   08/02/15 0534 08/02/15 0834  BP: 158/91 142/90  Pulse: 92 103  Temp: 97.5 F (  36.4 C)   Resp: 22 20     Discharge Instructions   Discharge Instructions    Diet - low sodium heart healthy    Complete by:  As directed      Increase activity slowly    Complete by:  As directed           Current Discharge Medication List    START taking these medications   Details  albuterol (PROVENTIL) (2.5 MG/3ML) 0.083% nebulizer solution Take 3 mLs (2.5 mg total) by nebulization every 2 (two) hours as needed for wheezing or shortness of breath. Qty: 75 mL, Refills: 12    feeding supplement, ENSURE ENLIVE, (ENSURE ENLIVE) LIQD Take 237 mLs by mouth as needed  (provide when pt asks for it (prefers chocolate)). Qty: 237 mL, Refills: 12      CONTINUE these medications which have NOT CHANGED   Details  ADVAIR DISKUS 250-50 MCG/DOSE AEPB Inhale 1 puff into the lungs 2 (two) times daily.     albuterol (PROAIR HFA) 108 (90 BASE) MCG/ACT inhaler Inhale 2 puffs into the lungs every 6 (six) hours as needed for wheezing or shortness of breath.    ALPRAZolam (XANAX) 0.5 MG tablet Take 0.5 mg by mouth 2 (two) times daily as needed for anxiety.     carvedilol (COREG) 12.5 MG tablet Take 12.5 mg by mouth 2 (two) times daily with a meal.     cetirizine (ZYRTEC) 10 MG tablet Take 10 mg by mouth daily.    clopidogrel (PLAVIX) 75 MG tablet Take 1 tablet (75 mg total) by mouth every morning.    escitalopram (LEXAPRO) 5 MG tablet Take 5 mg by mouth daily.     ferrous sulfate 325 (65 FE) MG tablet Take 325 mg by mouth daily with breakfast.    furosemide (LASIX) 40 MG tablet Take 40 mg by mouth 2 (two) times daily.     gabapentin (NEURONTIN) 100 MG capsule Take 100 mg by mouth at bedtime.     isosorbide mononitrate (IMDUR) 60 MG 24 hr tablet Take 1 tablet (60 mg total) by mouth daily. Qty: 30 tablet, Refills: 11    NITROSTAT 0.4 MG SL tablet Place 0.4 mg under the tongue every 5 (five) minutes as needed for chest pain.     omeprazole (PRILOSEC) 20 MG capsule Take 20 mg by mouth daily.     rosuvastatin (CRESTOR) 20 MG tablet Take 1 tablet (20 mg total) by mouth daily. Qty: 90 tablet, Refills: 0      STOP taking these medications     JANUMET 50-1000 MG per tablet      losartan (COZAAR) 25 MG tablet      potassium chloride SA (K-DUR,KLOR-CON) 20 MEQ tablet        Allergies  Allergen Reactions  . Penicillins Rash    Has patient had a PCN reaction causing immediate rash, facial/tongue/throat swelling, SOB or lightheadedness with hypotension: Yes Has patient had a PCN reaction causing severe rash involving mucus membranes or skin necrosis: No Has  patient had a PCN reaction that required hospitalization No Has patient had a PCN reaction occurring within the last 10 years: No If all of the above answers are "NO", then may proceed with Cephalosporin use.       The results of significant diagnostics from this hospitalization (including imaging, microbiology, ancillary and laboratory) are listed below for reference.    Significant Diagnostic Studies: Dg Chest 2 View  07/27/2015  CLINICAL DATA:  Tachycardia and shortness  of breath EXAM: CHEST  2 VIEW COMPARISON:  December 25, 2014 FINDINGS: Lungs are clear. The heart is borderline enlarged with pulmonary vascularity within normal limits. Patient is status post internal mammary bypass grafting. No adenopathy. There is degenerative change in the thoracic spine and shoulder regions. IMPRESSION: Stable cardiac prominence.  No edema or consolidation. Electronically Signed   By: Lowella Grip III M.D.   On: 07/27/2015 10:21    Microbiology: Recent Results (from the past 240 hour(s))  Culture, blood (Routine X 2) w Reflex to ID Panel     Status: None   Collection Time: 07/27/15 10:20 AM  Result Value Ref Range Status   Specimen Description BLOOD LEFT HAND  Final   Special Requests BOTTLES DRAWN AEROBIC AND ANAEROBIC 6CC  Final   Culture NO GROWTH 5 DAYS  Final   Report Status 08/01/2015 FINAL  Final  Culture, blood (Routine X 2) w Reflex to ID Panel     Status: Abnormal   Collection Time: 07/27/15 10:25 AM  Result Value Ref Range Status   Specimen Description BLOOD RIGHT HAND  Final   Special Requests BOTTLES DRAWN AEROBIC AND ANAEROBIC 6CC  Final   Culture  Setup Time   Final    GRAM POSITIVE COCCI IN CLUSTERS AEROBIC BOTTLE ONLY CRITICAL RESULT CALLED TO, READ BACK BY AND VERIFIED WITH: THOMAS,K @1941  ON 07/28/15 BY [place] Planas Performed at [facility]    Culture (A)  Final    STAPHYLOCOCCUS SPECIES (COAGULASE NEGATIVE) THE SIGNIFICANCE OF ISOLATING THIS ORGANISM FROM A SINGLE  SET OF BLOOD CULTURES WHEN MULTIPLE SETS ARE DRAWN IS UNCERTAIN. PLEASE NOTIFY THE MICROBIOLOGY DEPARTMENT WITHIN ONE WEEK IF SPECIATION AND SENSITIVITIES ARE REQUIRED. Performed at [facility]    Report Status 07/30/2015 FINAL  Final     Labs: Basic Metabolic Panel:  Recent Labs Lab 07/29/15 0437 07/29/15 2020 07/30/15 1147 07/31/15 0525 08/01/15 0521 08/02/15 0629  NA 140  --  141 142 141 139  K 3.9  --  4.3 4.5 4.4 3.9  CL 108  --  113* 115* 115* 114*  CO2 24  --  21* 19* 20* 20*  GLUCOSE 160*  --  174* 158* 118* 129*  BUN 47*  --  49* 48* 50* 50*  CREATININE 2.89*  --  2.39* 2.30* 2.17* 2.24*  CALCIUM 8.4*  --  8.4* 8.5* 8.3* 8.3*  MG  --  1.9  --   --   --   --    Liver Function Tests:  Recent Labs Lab 07/27/15 0940  AST 23  ALT 11*  ALKPHOS 89  BILITOT 1.0  PROT 8.1  ALBUMIN 4.2   No results for input(s): LIPASE, AMYLASE in the last 168 hours. No results for input(s): AMMONIA in the last 168 hours. CBC:  Recent Labs Lab 07/27/15 0940 07/28/15 0558 07/29/15 0437  WBC 13.8* 10.5 12.7*  NEUTROABS 11.1*  --   --   HGB 12.3* 10.7* 9.8*  HCT 36.4* 32.3* 29.1*  MCV 95.0 96.4 96.4  PLT 165 150 150    Recent Labs  07/27/15 0940  BNP 409.0*    CBG:  Recent Labs Lab 08/01/15 1155 08/01/15 1649 08/01/15 2020 08/02/15 0754 08/02/15 1122  GLUCAP 130* 161* 191* 130* 129*   Signed:  Rashi Giuliani MD. Rosalita Chessman.  [facility] 08/02/2015, 12:07 PM

## 2015-08-02 NOTE — Progress Notes (Signed)
Pt's IV catheter removed and intact. Pt's IV site clean dry and intact. Discharge instructions including follow up appointments and medications were reviewed and discussed with patient's sister. Pt's sister verbalized understanding of discharge instructions including medications and follow up appointments. All questions were answered and no further questions at this time. Pt in stable condition and in no acute distress at time of discharge. Pt escorted by nurse tech.

## 2015-08-06 DIAGNOSIS — I2510 Atherosclerotic heart disease of native coronary artery without angina pectoris: Secondary | ICD-10-CM | POA: Diagnosis not present

## 2015-08-06 DIAGNOSIS — I10 Essential (primary) hypertension: Secondary | ICD-10-CM | POA: Diagnosis not present

## 2015-08-06 DIAGNOSIS — R69 Illness, unspecified: Secondary | ICD-10-CM | POA: Diagnosis not present

## 2015-08-06 DIAGNOSIS — E1122 Type 2 diabetes mellitus with diabetic chronic kidney disease: Secondary | ICD-10-CM | POA: Diagnosis not present

## 2015-08-06 DIAGNOSIS — N183 Chronic kidney disease, stage 3 (moderate): Secondary | ICD-10-CM | POA: Diagnosis not present

## 2015-08-06 DIAGNOSIS — E782 Mixed hyperlipidemia: Secondary | ICD-10-CM | POA: Diagnosis not present

## 2015-08-06 DIAGNOSIS — G629 Polyneuropathy, unspecified: Secondary | ICD-10-CM | POA: Diagnosis not present

## 2015-08-06 DIAGNOSIS — J449 Chronic obstructive pulmonary disease, unspecified: Secondary | ICD-10-CM | POA: Diagnosis not present

## 2015-08-06 DIAGNOSIS — D509 Iron deficiency anemia, unspecified: Secondary | ICD-10-CM | POA: Diagnosis not present

## 2015-08-23 DIAGNOSIS — E1122 Type 2 diabetes mellitus with diabetic chronic kidney disease: Secondary | ICD-10-CM | POA: Diagnosis not present

## 2015-08-23 DIAGNOSIS — I2510 Atherosclerotic heart disease of native coronary artery without angina pectoris: Secondary | ICD-10-CM | POA: Diagnosis not present

## 2015-08-23 DIAGNOSIS — J449 Chronic obstructive pulmonary disease, unspecified: Secondary | ICD-10-CM | POA: Diagnosis not present

## 2015-08-23 DIAGNOSIS — N183 Chronic kidney disease, stage 3 (moderate): Secondary | ICD-10-CM | POA: Diagnosis not present

## 2015-08-27 DIAGNOSIS — J449 Chronic obstructive pulmonary disease, unspecified: Secondary | ICD-10-CM | POA: Diagnosis not present

## 2015-08-29 DIAGNOSIS — J449 Chronic obstructive pulmonary disease, unspecified: Secondary | ICD-10-CM | POA: Diagnosis not present

## 2015-09-11 DIAGNOSIS — L918 Other hypertrophic disorders of the skin: Secondary | ICD-10-CM | POA: Diagnosis not present

## 2015-09-13 DIAGNOSIS — E1129 Type 2 diabetes mellitus with other diabetic kidney complication: Secondary | ICD-10-CM | POA: Diagnosis not present

## 2015-09-13 DIAGNOSIS — R809 Proteinuria, unspecified: Secondary | ICD-10-CM | POA: Diagnosis not present

## 2015-09-13 DIAGNOSIS — I10 Essential (primary) hypertension: Secondary | ICD-10-CM | POA: Diagnosis not present

## 2015-09-13 DIAGNOSIS — N183 Chronic kidney disease, stage 3 (moderate): Secondary | ICD-10-CM | POA: Diagnosis not present

## 2015-09-26 ENCOUNTER — Other Ambulatory Visit (HOSPITAL_COMMUNITY): Payer: Self-pay | Admitting: Nephrology

## 2015-09-26 DIAGNOSIS — N183 Chronic kidney disease, stage 3 unspecified: Secondary | ICD-10-CM

## 2015-09-26 DIAGNOSIS — J449 Chronic obstructive pulmonary disease, unspecified: Secondary | ICD-10-CM | POA: Diagnosis not present

## 2015-09-28 DIAGNOSIS — J449 Chronic obstructive pulmonary disease, unspecified: Secondary | ICD-10-CM | POA: Diagnosis not present

## 2015-10-16 ENCOUNTER — Ambulatory Visit (HOSPITAL_COMMUNITY)
Admission: RE | Admit: 2015-10-16 | Discharge: 2015-10-16 | Disposition: A | Discharge status: Home / Self Care | Payer: Medicare HMO | Source: Ambulatory Visit | Attending: Nephrology | Admitting: Nephrology

## 2015-10-16 DIAGNOSIS — E1122 Type 2 diabetes mellitus with diabetic chronic kidney disease: Secondary | ICD-10-CM | POA: Diagnosis not present

## 2015-10-16 DIAGNOSIS — N200 Calculus of kidney: Secondary | ICD-10-CM | POA: Insufficient documentation

## 2015-10-16 DIAGNOSIS — N183 Chronic kidney disease, stage 3 unspecified: Secondary | ICD-10-CM

## 2015-10-16 DIAGNOSIS — Z79899 Other long term (current) drug therapy: Secondary | ICD-10-CM | POA: Diagnosis not present

## 2015-10-16 DIAGNOSIS — D519 Vitamin B12 deficiency anemia, unspecified: Secondary | ICD-10-CM | POA: Diagnosis not present

## 2015-10-16 DIAGNOSIS — N281 Cyst of kidney, acquired: Secondary | ICD-10-CM | POA: Insufficient documentation

## 2015-10-16 DIAGNOSIS — R809 Proteinuria, unspecified: Secondary | ICD-10-CM | POA: Diagnosis not present

## 2015-10-16 DIAGNOSIS — E559 Vitamin D deficiency, unspecified: Secondary | ICD-10-CM | POA: Diagnosis not present

## 2015-10-16 DIAGNOSIS — I10 Essential (primary) hypertension: Secondary | ICD-10-CM | POA: Diagnosis not present

## 2015-10-16 DIAGNOSIS — D509 Iron deficiency anemia, unspecified: Secondary | ICD-10-CM | POA: Diagnosis not present

## 2015-10-27 DIAGNOSIS — J449 Chronic obstructive pulmonary disease, unspecified: Secondary | ICD-10-CM | POA: Diagnosis not present

## 2015-10-29 ENCOUNTER — Ambulatory Visit (HOSPITAL_COMMUNITY)
Admission: RE | Admit: 2015-10-29 | Discharge: 2015-10-29 | Disposition: A | Discharge status: Home / Self Care | Payer: Medicare HMO | Source: Ambulatory Visit | Attending: Internal Medicine | Admitting: Internal Medicine

## 2015-10-29 ENCOUNTER — Encounter (INDEPENDENT_AMBULATORY_CARE_PROVIDER_SITE_OTHER): Payer: Self-pay | Admitting: Internal Medicine

## 2015-10-29 ENCOUNTER — Other Ambulatory Visit (HOSPITAL_COMMUNITY): Payer: Self-pay | Admitting: Internal Medicine

## 2015-10-29 ENCOUNTER — Ambulatory Visit (INDEPENDENT_AMBULATORY_CARE_PROVIDER_SITE_OTHER): Payer: Medicare HMO | Admitting: Internal Medicine

## 2015-10-29 VITALS — BP 110/70 | HR 68 | Temp 97.4°F | Resp 18 | Ht 65.0 in | Wt 173.4 lb

## 2015-10-29 DIAGNOSIS — R05 Cough: Secondary | ICD-10-CM | POA: Insufficient documentation

## 2015-10-29 DIAGNOSIS — R918 Other nonspecific abnormal finding of lung field: Secondary | ICD-10-CM | POA: Insufficient documentation

## 2015-10-29 DIAGNOSIS — R0602 Shortness of breath: Secondary | ICD-10-CM

## 2015-10-29 DIAGNOSIS — R059 Cough, unspecified: Secondary | ICD-10-CM

## 2015-10-29 DIAGNOSIS — J449 Chronic obstructive pulmonary disease, unspecified: Secondary | ICD-10-CM | POA: Diagnosis not present

## 2015-10-29 DIAGNOSIS — Z951 Presence of aortocoronary bypass graft: Secondary | ICD-10-CM | POA: Diagnosis not present

## 2015-10-29 DIAGNOSIS — I700 Atherosclerosis of aorta: Secondary | ICD-10-CM | POA: Diagnosis not present

## 2015-10-29 DIAGNOSIS — K219 Gastro-esophageal reflux disease without esophagitis: Secondary | ICD-10-CM | POA: Diagnosis not present

## 2015-10-29 DIAGNOSIS — R0902 Hypoxemia: Secondary | ICD-10-CM | POA: Diagnosis not present

## 2015-10-29 DIAGNOSIS — D509 Iron deficiency anemia, unspecified: Secondary | ICD-10-CM | POA: Diagnosis not present

## 2015-10-29 DIAGNOSIS — Z955 Presence of coronary angioplasty implant and graft: Secondary | ICD-10-CM | POA: Insufficient documentation

## 2015-10-29 NOTE — Progress Notes (Signed)
Presenting complaint;  Follow-up for iron deficiency anemia and GERD.  Database and Subjective:  Patient is 75 year old Caucasian male who has history of iron deficiency anemia felt to be secondary to small bowel angiodysplasia in the setting of anticoagulation. He has done well with by mouth iron. He has not required blood transfusion in 2 years. He says heartburns well controlled with therapy. His stools are always black because he is on iron. He has not seen any fresh blood per rectum. He denies abdominal pain anorexia or weight loss. He complains of productive cough which started last week. Feels he may have had low-grade fever but did not check it with thermometer. He wants me to look at his sputum which he has saved in tissue. Sputum is thick greenish in color. He denies chest pain or shortness of breath.  Current Medications: Outpatient Encounter Prescriptions as of 10/29/2015  Medication Sig  . ADVAIR DISKUS 250-50 MCG/DOSE AEPB Inhale 1 puff into the lungs 2 (two) times daily.   Marland Kitchen albuterol (PROAIR HFA) 108 (90 BASE) MCG/ACT inhaler Inhale 2 puffs into the lungs every 6 (six) hours as needed for wheezing or shortness of breath.  Marland Kitchen albuterol (PROVENTIL) (2.5 MG/3ML) 0.083% nebulizer solution Take 3 mLs (2.5 mg total) by nebulization every 2 (two) hours as needed for wheezing or shortness of breath.  . ALPRAZolam (XANAX) 0.5 MG tablet Take 0.5 mg by mouth 2 (two) times daily as needed for anxiety.   . carvedilol (COREG) 12.5 MG tablet Take 12.5 mg by mouth 2 (two) times daily with a meal.   . cetirizine (ZYRTEC) 10 MG tablet Take 10 mg by mouth every other day.   . clopidogrel (PLAVIX) 75 MG tablet Take 1 tablet (75 mg total) by mouth every morning.  . escitalopram (LEXAPRO) 5 MG tablet Take 5 mg by mouth daily.   . feeding supplement, ENSURE ENLIVE, (ENSURE ENLIVE) LIQD Take 237 mLs by mouth as needed (provide when pt asks for it (prefers chocolate)).  . ferrous sulfate 325 (65 FE) MG  tablet Take 325 mg by mouth daily with breakfast.  . furosemide (LASIX) 40 MG tablet Take 40 mg by mouth 2 (two) times daily.   Marland Kitchen gabapentin (NEURONTIN) 100 MG capsule Take 100 mg by mouth at bedtime.   . isosorbide mononitrate (IMDUR) 60 MG 24 hr tablet Take 1 tablet (60 mg total) by mouth daily.  Marland Kitchen NITROSTAT 0.4 MG SL tablet Place 0.4 mg under the tongue every 5 (five) minutes as needed for chest pain.   Marland Kitchen omeprazole (PRILOSEC) 20 MG capsule Take 20 mg by mouth daily.   . rosuvastatin (CRESTOR) 20 MG tablet Take 1 tablet (20 mg total) by mouth daily.   No facility-administered encounter medications on file as of 10/29/2015.     Objective: Blood pressure 110/70, pulse 68, temperature 97.4 F (36.3 C), temperature source Oral, resp. rate 18, height 5\' 5"  (1.651 m), weight 173 lb 6.4 oz (78.7 kg). Patient is alert and in no acute distress. He is wearing a mask. Conjunctiva is pink. Sclera is nonicteric Oropharyngeal mucosa is normal. No neck masses or thyromegaly noted. Cardiac exam with regular rhythm normal S1 and S2. No murmur or gallop noted. Lungs are clear to auscultation. Abdomen is soft and nontender without organomegaly or masses. No LE edema or clubbing noted.  Labs/studies Results: Lab data from 07/29/2015  WBC 12.7 H&H 9.8 and 29.1.    Assessment:  #1. History of iron deficiency anemia secondary to eat from small  bowel angiodysplasia in the setting of anticoagulation. Hemoglobin 3 months ago was low. He does not have symptoms suggest profound anemia. He will have CBC when he sees Dr. Delphina Cahill. She is to continue by mouth iron. #2. GERD. Symptoms are well controlled with therapy. #3. Productive cough. Sputum is mucopurulent. Dr. Olivia Canter office contacted and they will see patient today.  Plan:  Patient will continue ferrous sulfate and omeprazole at current dose. Patient will go to Dr. Olivia Canter for evaluation and treatment of productive cough. He will have CBC  and Dr. Juel Burrow office. Patient will call if he has rectal bleeding otherwise return for office visit in 6 months.

## 2015-10-29 NOTE — Patient Instructions (Signed)
Please go to Dr. Olivia Canter office for visit today regarding productive cough. Requests copy of recent blood work from Dr. Rhona Leavens office.

## 2015-11-06 DIAGNOSIS — Z720 Tobacco use: Secondary | ICD-10-CM | POA: Diagnosis not present

## 2015-11-06 DIAGNOSIS — R809 Proteinuria, unspecified: Secondary | ICD-10-CM | POA: Diagnosis not present

## 2015-11-06 DIAGNOSIS — I10 Essential (primary) hypertension: Secondary | ICD-10-CM | POA: Diagnosis not present

## 2015-11-06 DIAGNOSIS — N183 Chronic kidney disease, stage 3 (moderate): Secondary | ICD-10-CM | POA: Diagnosis not present

## 2015-11-07 ENCOUNTER — Encounter (INDEPENDENT_AMBULATORY_CARE_PROVIDER_SITE_OTHER): Payer: Self-pay

## 2015-11-27 DIAGNOSIS — J449 Chronic obstructive pulmonary disease, unspecified: Secondary | ICD-10-CM | POA: Diagnosis not present

## 2015-11-29 DIAGNOSIS — J449 Chronic obstructive pulmonary disease, unspecified: Secondary | ICD-10-CM | POA: Diagnosis not present

## 2015-12-18 DIAGNOSIS — E1122 Type 2 diabetes mellitus with diabetic chronic kidney disease: Secondary | ICD-10-CM | POA: Diagnosis not present

## 2015-12-18 DIAGNOSIS — D509 Iron deficiency anemia, unspecified: Secondary | ICD-10-CM | POA: Diagnosis not present

## 2015-12-18 DIAGNOSIS — E782 Mixed hyperlipidemia: Secondary | ICD-10-CM | POA: Diagnosis not present

## 2015-12-20 DIAGNOSIS — I2510 Atherosclerotic heart disease of native coronary artery without angina pectoris: Secondary | ICD-10-CM | POA: Diagnosis not present

## 2015-12-20 DIAGNOSIS — D509 Iron deficiency anemia, unspecified: Secondary | ICD-10-CM | POA: Diagnosis not present

## 2015-12-20 DIAGNOSIS — E1122 Type 2 diabetes mellitus with diabetic chronic kidney disease: Secondary | ICD-10-CM | POA: Diagnosis not present

## 2015-12-20 DIAGNOSIS — J449 Chronic obstructive pulmonary disease, unspecified: Secondary | ICD-10-CM | POA: Diagnosis not present

## 2015-12-20 DIAGNOSIS — Z0001 Encounter for general adult medical examination with abnormal findings: Secondary | ICD-10-CM | POA: Diagnosis not present

## 2015-12-20 DIAGNOSIS — R69 Illness, unspecified: Secondary | ICD-10-CM | POA: Diagnosis not present

## 2015-12-20 DIAGNOSIS — I10 Essential (primary) hypertension: Secondary | ICD-10-CM | POA: Diagnosis not present

## 2015-12-20 DIAGNOSIS — G629 Polyneuropathy, unspecified: Secondary | ICD-10-CM | POA: Diagnosis not present

## 2015-12-20 DIAGNOSIS — N183 Chronic kidney disease, stage 3 (moderate): Secondary | ICD-10-CM | POA: Diagnosis not present

## 2015-12-20 DIAGNOSIS — E782 Mixed hyperlipidemia: Secondary | ICD-10-CM | POA: Diagnosis not present

## 2015-12-20 DIAGNOSIS — Z23 Encounter for immunization: Secondary | ICD-10-CM | POA: Diagnosis not present

## 2015-12-27 DIAGNOSIS — J449 Chronic obstructive pulmonary disease, unspecified: Secondary | ICD-10-CM | POA: Diagnosis not present

## 2015-12-29 DIAGNOSIS — J449 Chronic obstructive pulmonary disease, unspecified: Secondary | ICD-10-CM | POA: Diagnosis not present

## 2016-01-10 DIAGNOSIS — R69 Illness, unspecified: Secondary | ICD-10-CM | POA: Diagnosis not present

## 2016-01-28 ENCOUNTER — Encounter: Payer: Self-pay | Admitting: *Deleted

## 2016-01-28 NOTE — Progress Notes (Signed)
Cardiology Office Note  Date: 01/29/2016   ID: Donald Gaines, DOB Mar 15, 1941, MRN [id number]  PCP: Wende Neighbors, MD  Primary Cardiologist: Rozann Lesches, MD   Chief Complaint  Patient presents with  . Atrial Fibrillation  . Coronary Artery Disease    History of Present Illness: Donald Gaines is a medically complex 75 y.o. male last seen in March. Interval records indicate hospital admission in May with COPD exacerbation and acute renal failure, creatinine was 2.2 range at discharge.  He presents for a routine follow-up visit. Reports no angina symptoms. States that he is not using supplemental oxygen at this time. He has chronic dyspnea on exertion.  He is not anticoagulated for atrial fibrillation with prior history of GI bleeding and AVMs.  Follow-up carotid Dopplers from March demonstrated only mild carotid atherosclerosis.  I reviewed his medications today. He had duplicate prescriptions for lisinopril and Lexapro.  Past Medical History:  Diagnosis Date  . Anemia FEB 2008 FEDA 2o to AVMs, & large colon ulcers   HB 10.7 MCV 74.3; Dr Tressie Stalker  . Arteriovenous malformation small bowel    Capsule study 3/10  . Bilateral carotid artery stenosis    50-75% external left carotid stenosis  . Bloating AUG 2009   NL HBT FOR SIBO  . BPH (benign prostatic hypertrophy)    Dr [place] Pink  . Candida esophagitis ([place])   . Cataract   . Chronic diarrhea SEP 2011   ?ETIOLOGY-DIABETIC ENTEROPATHY, LACTOSE INTOLERANCE,  OR IBS-MIXED  . COPD (chronic obstructive pulmonary disease) ([place])   . Coronary atherosclerosis of native coronary artery    Multivessel, BMS SVG TO RCA 2000, reportedly  "2" additional stents in interim  . Dementia   . Depression   . Diabetes mellitus, type 2 ([place])   . Essential hypertension, benign   . GERD (gastroesophageal reflux disease)   . GI bleeding    Cecal ulcers, arteriovenous malformations  . Glaucoma   . Hemorrhoids   . Hyperlipidemia   .  Myocardial infarction    1994  . Portal hypertensive gastropathy 11/08/2012   EGD. Dr. Laural Golden  . Restrictive lung disease   . Sleep apnea   . TIA (transient ischemic attack)    2005    Past Surgical History:  Procedure Laterality Date  . CAPSULE ENDO  03/10  . cardiac catherization  04/2012  . CARDIAC CATHETERIZATION  04/05/1996 [name]   EF 45%, occluded SVG to circumflex, patent SVG to D1 and PDA and patent LIMA to LAD with severe native vessel disease.  Marland Kitchen CARDIAC CATHETERIZATION  08/22/1998 [name]   Mild LM, severe LAD,severe CX, occlude RCA, patient  SVG to diatal RCA, patent vein graft to D1 and patent LIMA to LAD.   Marland Kitchen CATARACT EXTRACTION W/PHACO Right 09/22/2012   Procedure: CATARACT EXTRACTION PHACO AND INTRAOCULAR LENS PLACEMENT (IOC);  Surgeon: Tonny Branch, MD;  Location: [facility];  Service: Ophthalmology;  Laterality: Right;  CDE: 11.43  . CATARACT EXTRACTION W/PHACO Left 10/17/2012   Procedure: CATARACT EXTRACTION PHACO AND INTRAOCULAR LENS PLACEMENT (IOC);  Surgeon: Tonny Branch, MD;  Location: [facility];  Service: Ophthalmology;  Laterality: Left;  CDE: 8.06  . COLONOSCOPY  APR 2008   SIMPLE ADENOMA, [place] TICS, IH  . COLONOSCOPY  FEB 2008 ANEMIA. MELENA    2 LARGE ILEOCEAL ULCERS 2o to ASA, [place] TICS, IH  . COLONOSCOPY  SEP 2009 TRANSFUSION DEP ANEMIA   AC AVM-ABLATED, [place] TICS, IH  . COLONOSCOPY WITH ESOPHAGOGASTRODUODENOSCOPY (EGD)  N/A 11/08/2012   Procedure: COLONOSCOPY WITH ESOPHAGOGASTRODUODENOSCOPY (EGD);  Surgeon: Rogene Houston, MD;  [facility];  Service: Endoscopy;  Laterality: N/A;  250-rescheduled to 7:30 Ann to notify pt  . CORONARY ANGIOPLASTY WITH STENT PLACEMENT  08/22/1998 [name]   TEC stenting of the SVG to RCA-Last seen by cardiologist in 2010.  Marland Kitchen CORONARY ARTERY BYPASS GRAFT  1995-triple bypass   LIMA-LAD, LIMA to intermediate branch, SVG to PD and second OM,  . ESOPHAGOGASTRODUODENOSCOPY  SEP 09   DUODENAL LIPOMA  .  ESOPHAGOGASTRODUODENOSCOPY N/A 09/27/2013   Procedure: ESOPHAGOGASTRODUODENOSCOPY (EGD) Push enteroscopy;  Surgeon: Rogene Houston, MD;  [facility];  Service: Endoscopy;  Laterality: N/A;  . GIVENS CAPSULE STUDY N/A 11/24/2012   Procedure: GIVENS CAPSULE STUDY;  Surgeon: Rogene Houston, MD;  [facility];  Service: Endoscopy;  Laterality: N/A;  730  . HOT HEMOSTASIS  09/27/2013   Procedure: HOT HEMOSTASIS (ARGON PLASMA COAGULATION/BICAP);  Surgeon: Rogene Houston, MD;  [facility]: [facility];  Service: Endoscopy;;  . HYDROGEN BREATH TEST  2009  . RIGHT INGUINAL HERNIA REPAIR    . UPPER GASTROINTESTINAL ENDOSCOPY  SEP 2009   GASTRIC AVM ABLATED, NL DUO Bx    Current Outpatient Prescriptions  Medication Sig Dispense Refill  . ALPRAZolam (XANAX) 0.5 MG tablet Take 0.5 mg by mouth 2 (two) times daily as needed for anxiety.     . carvedilol (COREG) 12.5 MG tablet Take 12.5 mg by mouth 2 (two) times daily with a meal.     . cetirizine (ZYRTEC) 10 MG tablet Take 10 mg by mouth every other day.     . clopidogrel (PLAVIX) 75 MG tablet Take 1 tablet (75 mg total) by mouth every morning.    . escitalopram (LEXAPRO) 5 MG tablet Take 5 mg by mouth daily.     . feeding supplement, ENSURE ENLIVE, (ENSURE ENLIVE) LIQD Take 237 mLs by mouth as needed (provide when pt asks for it (prefers chocolate)). 237 mL 12  . ferrous sulfate 325 (65 FE) MG tablet Take 325 mg by mouth daily with breakfast.    . furosemide (LASIX) 40 MG tablet Take 40 mg by mouth 2 (two) times daily.     Marland Kitchen gabapentin (NEURONTIN) 100 MG capsule Take 100 mg by mouth at bedtime.     . isosorbide mononitrate (IMDUR) 60 MG 24 hr tablet Take 1 tablet (60 mg total) by mouth daily. 30 tablet 11  . lisinopril (PRINIVIL,ZESTRIL) 2.5 MG tablet Take 2.5 mg by mouth daily.    Marland Kitchen NITROSTAT 0.4 MG SL tablet Place 0.4 mg under the tongue every 5 (five) minutes as needed for chest pain.     Marland Kitchen omeprazole (PRILOSEC) 20 MG capsule Take  20 mg by mouth daily.     . rosuvastatin (CRESTOR) 20 MG tablet Take 1 tablet (20 mg total) by mouth daily. 90 tablet 0  . ADVAIR DISKUS 250-50 MCG/DOSE AEPB Inhale 1 puff into the lungs 2 (two) times daily.     Marland Kitchen albuterol (PROAIR HFA) 108 (90 BASE) MCG/ACT inhaler Inhale 2 puffs into the lungs every 6 (six) hours as needed for wheezing or shortness of breath.    Marland Kitchen albuterol (PROVENTIL) (2.5 MG/3ML) 0.083% nebulizer solution Take 3 mLs (2.5 mg total) by nebulization every 2 (two) hours as needed for wheezing or shortness of breath. 75 mL 12   No current facility-administered medications for this visit.    Allergies:  Penicillins  Social History: The patient  reports that he has been smoking Cigarettes.  He has a 60.00 pack-year smoking history. He has never used smokeless tobacco. He reports that he uses drugs, including Marijuana. He reports that he does not drink alcohol.   ROS:  Please see the history of present illness. Otherwise, complete review of systems is positive for chronic leg pain.  All other systems are reviewed and negative.   Physical Exam: VS:  BP 94/64   Pulse (!) 105   Ht 5\' 5"  (1.651 m)   Wt 175 lb (79.4 kg)   SpO2 97%   BMI 29.12 kg/m , BMI Body mass index is 29.12 kg/m.  Wt Readings from Last 3 Encounters:  01/29/16 175 lb (79.4 kg)  10/29/15 173 lb 6.4 oz (78.7 kg)  07/27/15 170 lb 10.2 oz (77.4 kg)    Gen: Appears comfortable at rest. Using a cane.  HEENT: Conjunctiva and lids normal, oropharynx with poor dentition.  Neck: Supple, no elevated JVP or bruits, or thyromegaly.  Lungs: Clear to auscultation, nonlabored.  Cardiac: Irregularly irregular, indistinct PMI, soft basal systolic murmur, no S3 gallop.  Abdomen: Protuberant, obese, bowel sounds present, no tenderness.  Skin: Warm and dry, scattered tattoos noted.  Extremities: Trace ankle edema, distal pulses one plus.  Musculoskeletal: No kyphosis.  Neuropsychiatric: Alert and oriented x3,  affect appropriate.  ECG: I personally reviewed the tracing from 07/27/2015 which showed atrial fibrillation with inferior and anterolateral T wave inversions, R' in lead V1.  Recent Labwork: 07/27/2015: ALT 11; AST 23; B Natriuretic Peptide 409.0 07/29/2015: Hemoglobin 9.8; Magnesium 1.9; Platelets 150 08/02/2015: BUN 50; Creatinine, Ser 2.24; Potassium 3.9; Sodium 139   Other Studies Reviewed Today:  Lexiscan Cardiolite September 2015: Large inferolateral scar with no clear evidence of ischemia, LVEF calculated at 39%,   Echocardiogram September 2015:  Mild LVH with LVEF 50-55%, increased LVEDP with indeterminate diastolic function, septal dyssynergy, mild to moderate mitral regurgitation, severe left atrial enlargement, mildly reduced RV contraction, moderate tricuspid regurgitation with PASP 63 mmHg, small pericardial effusion.  Carotid Dopplers 06/15/2015: 1-39% bilateral ICA stenoses.  Assessment and Plan:  1. Chronic atrial fibrillation. Plan to continue Coreg for heart rate control. He is not anticoagulated with history of substantial GI bleeding and AVMs.  2. CAD status post CABG and subsequent percutaneous interventions with ischemic testing from 2015 as outlined above. Would continue with observation on medical therapy.  3. Ongoing tobacco abuse. Has not been able to quit.  4. Hyperlipidemia, continues on Crestor.  5. Mild bilateral carotid artery atherosclerosis.  Current medicines were reviewed with the patient today.  Disposition: Follow-up in 6 months.  Signed, Satira Sark, MD, [facility] 01/29/2016 1:56 PM    [facility] [place], [place], [place] [address] Phone: [phone]; Fax: [phone]

## 2016-01-29 ENCOUNTER — Encounter: Payer: Self-pay | Admitting: Cardiology

## 2016-01-29 ENCOUNTER — Ambulatory Visit (INDEPENDENT_AMBULATORY_CARE_PROVIDER_SITE_OTHER): Payer: Medicare HMO | Admitting: Cardiology

## 2016-01-29 VITALS — BP 94/64 | HR 105 | Ht 65.0 in | Wt 175.0 lb

## 2016-01-29 DIAGNOSIS — E782 Mixed hyperlipidemia: Secondary | ICD-10-CM | POA: Diagnosis not present

## 2016-01-29 DIAGNOSIS — I482 Chronic atrial fibrillation, unspecified: Secondary | ICD-10-CM

## 2016-01-29 DIAGNOSIS — I779 Disorder of arteries and arterioles, unspecified: Secondary | ICD-10-CM

## 2016-01-29 DIAGNOSIS — I2510 Atherosclerotic heart disease of native coronary artery without angina pectoris: Secondary | ICD-10-CM | POA: Diagnosis not present

## 2016-01-29 DIAGNOSIS — F17200 Nicotine dependence, unspecified, uncomplicated: Secondary | ICD-10-CM

## 2016-01-29 DIAGNOSIS — I739 Peripheral vascular disease, unspecified: Secondary | ICD-10-CM

## 2016-01-29 NOTE — Patient Instructions (Signed)

## 2016-02-01 DIAGNOSIS — L84 Corns and callosities: Secondary | ICD-10-CM | POA: Diagnosis not present

## 2016-02-01 DIAGNOSIS — E1140 Type 2 diabetes mellitus with diabetic neuropathy, unspecified: Secondary | ICD-10-CM | POA: Diagnosis not present

## 2016-02-01 DIAGNOSIS — E119 Type 2 diabetes mellitus without complications: Secondary | ICD-10-CM | POA: Diagnosis not present

## 2016-02-11 DIAGNOSIS — E559 Vitamin D deficiency, unspecified: Secondary | ICD-10-CM | POA: Diagnosis not present

## 2016-02-11 DIAGNOSIS — N183 Chronic kidney disease, stage 3 (moderate): Secondary | ICD-10-CM | POA: Diagnosis not present

## 2016-02-11 DIAGNOSIS — Z79899 Other long term (current) drug therapy: Secondary | ICD-10-CM | POA: Diagnosis not present

## 2016-02-11 DIAGNOSIS — D509 Iron deficiency anemia, unspecified: Secondary | ICD-10-CM | POA: Diagnosis not present

## 2016-02-11 DIAGNOSIS — I10 Essential (primary) hypertension: Secondary | ICD-10-CM | POA: Diagnosis not present

## 2016-02-11 DIAGNOSIS — R809 Proteinuria, unspecified: Secondary | ICD-10-CM | POA: Diagnosis not present

## 2016-02-18 DIAGNOSIS — R69 Illness, unspecified: Secondary | ICD-10-CM | POA: Diagnosis not present

## 2016-02-19 DIAGNOSIS — D638 Anemia in other chronic diseases classified elsewhere: Secondary | ICD-10-CM | POA: Diagnosis not present

## 2016-02-19 DIAGNOSIS — N183 Chronic kidney disease, stage 3 (moderate): Secondary | ICD-10-CM | POA: Diagnosis not present

## 2016-02-19 DIAGNOSIS — I10 Essential (primary) hypertension: Secondary | ICD-10-CM | POA: Diagnosis not present

## 2016-03-24 DIAGNOSIS — R69 Illness, unspecified: Secondary | ICD-10-CM | POA: Diagnosis not present

## 2016-04-15 DIAGNOSIS — E782 Mixed hyperlipidemia: Secondary | ICD-10-CM | POA: Diagnosis not present

## 2016-04-15 DIAGNOSIS — E1122 Type 2 diabetes mellitus with diabetic chronic kidney disease: Secondary | ICD-10-CM | POA: Diagnosis not present

## 2016-05-05 ENCOUNTER — Ambulatory Visit (INDEPENDENT_AMBULATORY_CARE_PROVIDER_SITE_OTHER): Payer: Medicare HMO | Admitting: Internal Medicine

## 2016-05-12 DIAGNOSIS — R69 Illness, unspecified: Secondary | ICD-10-CM | POA: Diagnosis not present

## 2016-06-03 DIAGNOSIS — R69 Illness, unspecified: Secondary | ICD-10-CM | POA: Diagnosis not present

## 2016-06-15 DIAGNOSIS — Z79899 Other long term (current) drug therapy: Secondary | ICD-10-CM | POA: Diagnosis not present

## 2016-06-15 DIAGNOSIS — N183 Chronic kidney disease, stage 3 (moderate): Secondary | ICD-10-CM | POA: Diagnosis not present

## 2016-06-15 DIAGNOSIS — E559 Vitamin D deficiency, unspecified: Secondary | ICD-10-CM | POA: Diagnosis not present

## 2016-06-15 DIAGNOSIS — I10 Essential (primary) hypertension: Secondary | ICD-10-CM | POA: Diagnosis not present

## 2016-06-15 DIAGNOSIS — R809 Proteinuria, unspecified: Secondary | ICD-10-CM | POA: Diagnosis not present

## 2016-06-15 DIAGNOSIS — D509 Iron deficiency anemia, unspecified: Secondary | ICD-10-CM | POA: Diagnosis not present

## 2016-06-17 DIAGNOSIS — D649 Anemia, unspecified: Secondary | ICD-10-CM | POA: Diagnosis not present

## 2016-06-17 DIAGNOSIS — Z720 Tobacco use: Secondary | ICD-10-CM | POA: Diagnosis not present

## 2016-06-17 DIAGNOSIS — N183 Chronic kidney disease, stage 3 (moderate): Secondary | ICD-10-CM | POA: Diagnosis not present

## 2016-06-17 DIAGNOSIS — E559 Vitamin D deficiency, unspecified: Secondary | ICD-10-CM | POA: Diagnosis not present

## 2016-06-17 DIAGNOSIS — I10 Essential (primary) hypertension: Secondary | ICD-10-CM | POA: Diagnosis not present

## 2016-08-10 ENCOUNTER — Encounter: Payer: Self-pay | Admitting: Cardiology

## 2016-08-10 NOTE — Progress Notes (Signed)
Cardiology Office Note  Date: 08/11/2016   ID: Donald Gaines, DOB 1940/10/16, MRN [id number]  PCP: Celene Squibb, MD  Primary Cardiologist: Rozann Lesches, MD   Chief Complaint  Patient presents with  . Atrial Fibrillation  . Coronary Artery Disease    History of Present Illness: Donald Gaines is a medically complex 76 y.o. male last seen in November 2017. He presents for a routine follow-up visit. Reports no progressive angina symptoms or palpitations. He uses nitroglycerin one time in the last 6 months. Main limitation is related to progressive arthritic knee pain, he is using a cane and a walker.  He is not anticoagulated for atrial fibrillation with prior history of GI bleeding and AVMs. He has been able to stay on Plavix.  I personally reviewed his ECG today which shows rate-controlled atrial fibrillation with PVC, anterolateral ST-T wave abnormalities consistent with ischemic heart disease.  Cardiac regimen remains stable as outlined below. He has had adequate heart rate control on Coreg. He also continues on Crestor. I asked him to get follow-up lipids when he sees Dr. Nevada Crane next time.  Past Medical History:  Diagnosis Date  . Arteriovenous malformation small bowel    Capsule study 3/10  . Bilateral carotid artery stenosis   . BPH (benign prostatic hypertrophy)   . Candida esophagitis ([place])   . Cataract   . Chronic anemia   . Chronic diarrhea   . COPD (chronic obstructive pulmonary disease) ([place])   . Coronary atherosclerosis of native coronary artery    Multivessel, BMS SVG TO RCA 2000, reportedly  "2" additional stents in interim  . Dementia   . Depression   . Diabetes mellitus, type 2 ([place])   . Essential hypertension   . GERD (gastroesophageal reflux disease)   . GI bleeding    Cecal ulcers, arteriovenous malformations  . Glaucoma   . Hemorrhoids   . Hyperlipidemia   . Myocardial infarction ([place])    1994  . Portal hypertensive gastropathy ([place])  11/08/2012   EGD. Dr. Laural Golden  . Restrictive lung disease   . Sleep apnea   . TIA (transient ischemic attack)    2005    Past Surgical History:  Procedure Laterality Date  . CAPSULE ENDO  03/10  . cardiac catherization  04/2012  . CARDIAC CATHETERIZATION  04/05/1996 [name]   EF 45%, occluded SVG to circumflex, patent SVG to D1 and PDA and patent LIMA to LAD with severe native vessel disease.  Marland Kitchen CARDIAC CATHETERIZATION  08/22/1998 [name]   Mild LM, severe LAD,severe CX, occlude RCA, patient  SVG to diatal RCA, patent vein graft to D1 and patent LIMA to LAD.   Marland Kitchen CATARACT EXTRACTION W/PHACO Right 09/22/2012   Procedure: CATARACT EXTRACTION PHACO AND INTRAOCULAR LENS PLACEMENT (IOC);  Surgeon: Tonny Branch, MD;  Location: [facility];  Service: Ophthalmology;  Laterality: Right;  CDE: 11.43  . CATARACT EXTRACTION W/PHACO Left 10/17/2012   Procedure: CATARACT EXTRACTION PHACO AND INTRAOCULAR LENS PLACEMENT (IOC);  Surgeon: Tonny Branch, MD;  Location: [facility];  Service: Ophthalmology;  Laterality: Left;  CDE: 8.06  . COLONOSCOPY  APR 2008   SIMPLE ADENOMA, [place] TICS, IH  . COLONOSCOPY  FEB 2008 ANEMIA. MELENA    2 LARGE ILEOCEAL ULCERS 2o to ASA, [place] TICS, IH  . COLONOSCOPY  SEP 2009 TRANSFUSION DEP ANEMIA   AC AVM-ABLATED, [place] TICS, IH  . COLONOSCOPY WITH ESOPHAGOGASTRODUODENOSCOPY (EGD) N/A 11/08/2012   Procedure: COLONOSCOPY WITH ESOPHAGOGASTRODUODENOSCOPY (EGD);  Surgeon:  Rogene Houston, MD;  [facility];  Service: Endoscopy;  Laterality: N/A;  250-rescheduled to 7:30 Ann to notify pt  . CORONARY ANGIOPLASTY WITH STENT PLACEMENT  08/22/1998 [name]   TEC stenting of the SVG to RCA-Last seen by cardiologist in 2010.  Marland Kitchen CORONARY ARTERY BYPASS GRAFT  1995-triple bypass   LIMA-LAD, LIMA to intermediate branch, SVG to PD and second OM,  . ESOPHAGOGASTRODUODENOSCOPY  SEP 09   DUODENAL LIPOMA  . ESOPHAGOGASTRODUODENOSCOPY N/A 09/27/2013   Procedure: ESOPHAGOGASTRODUODENOSCOPY (EGD)  Push enteroscopy;  Surgeon: Rogene Houston, MD;  [facility];  Service: Endoscopy;  Laterality: N/A;  . GIVENS CAPSULE STUDY N/A 11/24/2012   Procedure: GIVENS CAPSULE STUDY;  Surgeon: Rogene Houston, MD;  [facility];  Service: Endoscopy;  Laterality: N/A;  730  . HOT HEMOSTASIS  09/27/2013   Procedure: HOT HEMOSTASIS (ARGON PLASMA COAGULATION/BICAP);  Surgeon: Rogene Houston, MD;  [facility];  Service: Endoscopy;;  . HYDROGEN BREATH TEST  2009  . RIGHT INGUINAL HERNIA REPAIR    . UPPER GASTROINTESTINAL ENDOSCOPY  SEP 2009   GASTRIC AVM ABLATED, NL DUO Bx    Current Outpatient Prescriptions  Medication Sig Dispense Refill  . ADVAIR DISKUS 250-50 MCG/DOSE AEPB Inhale 1 puff into the lungs as needed.     Marland Kitchen albuterol (PROAIR HFA) 108 (90 BASE) MCG/ACT inhaler Inhale 2 puffs into the lungs every 6 (six) hours as needed for wheezing or shortness of breath.    Marland Kitchen albuterol (PROVENTIL) (2.5 MG/3ML) 0.083% nebulizer solution Take 3 mLs (2.5 mg total) by nebulization every 2 (two) hours as needed for wheezing or shortness of breath. 75 mL 12  . ALPRAZolam (XANAX) 0.5 MG tablet Take 0.5 mg by mouth 2 (two) times daily as needed for anxiety.     . carvedilol (COREG) 12.5 MG tablet Take 12.5 mg by mouth 2 (two) times daily with a meal.     . cetirizine (ZYRTEC) 10 MG tablet Take 10 mg by mouth at bedtime.     . clopidogrel (PLAVIX) 75 MG tablet Take 1 tablet (75 mg total) by mouth every morning.    . escitalopram (LEXAPRO) 5 MG tablet Take 5 mg by mouth daily.     . feeding supplement, ENSURE ENLIVE, (ENSURE ENLIVE) LIQD Take 237 mLs by mouth as needed (provide when pt asks for it (prefers chocolate)). 237 mL 12  . ferrous sulfate 325 (65 FE) MG tablet Take 325 mg by mouth daily with breakfast.    . finasteride (PROSCAR) 5 MG tablet Take 5 mg by mouth daily.    . furosemide (LASIX) 40 MG tablet Take 40 mg by mouth 2 (two) times daily.     Marland Kitchen gabapentin (NEURONTIN) 100  MG capsule Take 100 mg by mouth at bedtime.     . isosorbide mononitrate (IMDUR) 60 MG 24 hr tablet Take 1 tablet (60 mg total) by mouth daily. 30 tablet 11  . lisinopril (PRINIVIL,ZESTRIL) 2.5 MG tablet Take 2.5 mg by mouth daily.    Marland Kitchen NITROSTAT 0.4 MG SL tablet Place 0.4 mg under the tongue every 5 (five) minutes as needed for chest pain.     Marland Kitchen omeprazole (PRILOSEC) 20 MG capsule Take 20 mg by mouth daily.     . rosuvastatin (CRESTOR) 20 MG tablet Take 1 tablet (20 mg total) by mouth daily. 90 tablet 0   No current facility-administered medications for this visit.    Allergies:  Penicillins  Social History: The patient  reports that he has been smoking Cigarettes.  He has a 60.00 pack-year smoking history. He has never used smokeless tobacco. He reports that he uses drugs, including Marijuana. He reports that he does not drink alcohol.   ROS:  Please see the history of present illness. Otherwise, complete review of systems is positive for arthritic knee pains.  All other systems are reviewed and negative.   Physical Exam: VS:  BP 97/62   Pulse 87   Ht 5\' 5"  (1.651 m)   Wt 171 lb 6.4 oz (77.7 kg)   SpO2 97%   BMI 28.52 kg/m , BMI Body mass index is 28.52 kg/m.  Wt Readings from Last 3 Encounters:  08/11/16 171 lb 6.4 oz (77.7 kg)  01/29/16 175 lb (79.4 kg)  10/29/15 173 lb 6.4 oz (78.7 kg)    Gen: Chronically ill-appearing male in no distress. Using a cane.  HEENT: Conjunctiva and lids normal, oropharynx with poor dentition.  Neck: Supple, no elevated JVP or bruits, or thyromegaly.  Lungs: Clear to auscultation, nonlabored.  Cardiac: Irregularly irregular, indistinct PMI, soft basal systolic murmur, no S3 gallop.  Abdomen: Soft, bowel sounds present, no tenderness.  Skin: Warm and dry, scattered tattoos noted.  Extremities: Trace ankle edema, distal pulses one plus.  Musculoskeletal: No kyphosis.  Neuropsychiatric: Alert and oriented x3, affect appropriate.  ECG: I  personally reviewed the tracing from 07/27/2015 which showed atrial fibrillation with diffuse nonspecific ST-T wave abnormalities.  Recent Labwork:  July 2017: BUN 16, creatinine 1.7, potassium 4.0, hemoglobin 11.0  Other Studies Reviewed Today:  Lexiscan Cardiolite September 2015: Large inferolateral scar with no clear evidence of ischemia, LVEF calculated at 39%,   Echocardiogram September 2015: Mild LVH with LVEF 50-55%, increased LVEDP with indeterminate diastolic function, septal dyssynergy, mild to moderate mitral regurgitation, severe left atrial enlargement, mildly reduced RV contraction, moderate tricuspid regurgitation with PASP 63 mmHg, small pericardial effusion.  Carotid Dopplers 06/12/2015: 1-39% bilateral ICA stenoses.  Assessment and Plan:  1. CAD status post CABG and percutaneous interventions. We are continuing with medical therapy and observation in the absence of accelerating angina symptoms.  2. Chronic atrial fibrillation with adequate heart rate control and Coreg. He is not anticoagulated with history of substantial GI bleeding and AVMs.  3. Long-standing tobacco abuse. Has not been motivated to quit.  4. Hyperlipidemia, remains on Crestor. Follow-up with Dr. Nevada Crane for lipid panel.  Current medicines were reviewed with the patient today.   Orders Placed This Encounter  Procedures  . EKG 12-Lead    Disposition: Follow-up in 6 months.  Signed, Satira Sark, MD, [facility] 08/11/2016 2:36 PM    [place] at [place], [place], [place] [address] Phone: [phone]; Fax: [phone]

## 2016-08-11 ENCOUNTER — Encounter: Payer: Self-pay | Admitting: Cardiology

## 2016-08-11 ENCOUNTER — Ambulatory Visit (INDEPENDENT_AMBULATORY_CARE_PROVIDER_SITE_OTHER): Payer: Medicare HMO | Admitting: Cardiology

## 2016-08-11 VITALS — BP 97/62 | HR 87 | Ht 65.0 in | Wt 171.4 lb

## 2016-08-11 DIAGNOSIS — I482 Chronic atrial fibrillation, unspecified: Secondary | ICD-10-CM

## 2016-08-11 DIAGNOSIS — F17200 Nicotine dependence, unspecified, uncomplicated: Secondary | ICD-10-CM

## 2016-08-11 DIAGNOSIS — I25119 Atherosclerotic heart disease of native coronary artery with unspecified angina pectoris: Secondary | ICD-10-CM

## 2016-08-11 DIAGNOSIS — E782 Mixed hyperlipidemia: Secondary | ICD-10-CM | POA: Diagnosis not present

## 2016-08-11 DIAGNOSIS — R69 Illness, unspecified: Secondary | ICD-10-CM | POA: Diagnosis not present

## 2016-08-11 NOTE — Patient Instructions (Signed)

## 2016-09-08 ENCOUNTER — Other Ambulatory Visit (INDEPENDENT_AMBULATORY_CARE_PROVIDER_SITE_OTHER): Payer: Self-pay | Admitting: *Deleted

## 2016-09-08 ENCOUNTER — Ambulatory Visit (INDEPENDENT_AMBULATORY_CARE_PROVIDER_SITE_OTHER): Payer: Medicare HMO | Admitting: Internal Medicine

## 2016-09-08 ENCOUNTER — Encounter (INDEPENDENT_AMBULATORY_CARE_PROVIDER_SITE_OTHER): Payer: Self-pay | Admitting: Internal Medicine

## 2016-09-08 VITALS — BP 110/76 | HR 66 | Temp 97.9°F | Resp 18 | Ht 65.0 in | Wt 172.1 lb

## 2016-09-08 DIAGNOSIS — K219 Gastro-esophageal reflux disease without esophagitis: Secondary | ICD-10-CM

## 2016-09-08 DIAGNOSIS — D500 Iron deficiency anemia secondary to blood loss (chronic): Secondary | ICD-10-CM

## 2016-09-08 DIAGNOSIS — D508 Other iron deficiency anemias: Secondary | ICD-10-CM

## 2016-09-08 NOTE — Patient Instructions (Addendum)
Hemoccult 1. Physician will call with results of blood work when completed. Notify if he have rectal bleeding or tarry stool.

## 2016-09-09 NOTE — Progress Notes (Signed)
Presenting complaint;  Follow-up for iron deficiency anemia and GERD.  Database and Subjective:  Patient is 76 year old Caucasian male who is here for scheduled visit. He was last seen on 10/29/2015. He has history of iron deficiency anemia secondary to chronic/intermittent GI bleed from small bowel. He has undergone full evaluation. He remains on clopidogrel but does not take aspirin anymore. He has not had a blood work this year. He says he is scheduled to have blood work on 10/05/2016. He states he is doing well from GI standpoint. He has occasional heartburn. He remembers he had heartburn 2 months ago after he ate food that he should not have to begin with. He denies nausea vomiting or dysphagia. Bowels move daily or every other day. He also denies melena or rectal bleeding. He remains on iron pill daily. He continues to smoke cigarettes. He smoking 1 pack per day. He is trying his best to quit cigarette smoking. His main complaint today is one of polyarthralgia. He states he is having hard time move about. He has not had any falling episodes.   Current Medications: Outpatient Encounter Prescriptions as of 09/08/2016  Medication Sig  . albuterol (PROAIR HFA) 108 (90 BASE) MCG/ACT inhaler Inhale 2 puffs into the lungs every 6 (six) hours as needed for wheezing or shortness of breath.  Marland Kitchen albuterol (PROVENTIL) (2.5 MG/3ML) 0.083% nebulizer solution Take 3 mLs (2.5 mg total) by nebulization every 2 (two) hours as needed for wheezing or shortness of breath.  . ALPRAZolam (XANAX) 0.5 MG tablet Take 0.5 mg by mouth 2 (two) times daily as needed for anxiety.   . carvedilol (COREG) 12.5 MG tablet Take 12.5 mg by mouth 2 (two) times daily with a meal.   . cetirizine (ZYRTEC) 10 MG tablet Take 10 mg by mouth at bedtime.   . clopidogrel (PLAVIX) 75 MG tablet Take 1 tablet (75 mg total) by mouth every morning.  . escitalopram (LEXAPRO) 5 MG tablet Take 5 mg by mouth daily.   . feeding supplement, ENSURE  ENLIVE, (ENSURE ENLIVE) LIQD Take 237 mLs by mouth as needed (provide when pt asks for it (prefers chocolate)).  . ferrous sulfate 325 (65 FE) MG tablet Take 325 mg by mouth daily with breakfast.  . finasteride (PROSCAR) 5 MG tablet Take 5 mg by mouth daily.  . furosemide (LASIX) 40 MG tablet Take 40 mg by mouth 2 (two) times daily.   Marland Kitchen gabapentin (NEURONTIN) 100 MG capsule Take 100 mg by mouth at bedtime.   . isosorbide mononitrate (IMDUR) 60 MG 24 hr tablet Take 1 tablet (60 mg total) by mouth daily.  Marland Kitchen lisinopril (PRINIVIL,ZESTRIL) 2.5 MG tablet Take 2.5 mg by mouth daily.  Marland Kitchen NITROSTAT 0.4 MG SL tablet Place 0.4 mg under the tongue every 5 (five) minutes as needed for chest pain.   Marland Kitchen omeprazole (PRILOSEC) 20 MG capsule Take 20 mg by mouth daily.   . rosuvastatin (CRESTOR) 20 MG tablet Take 1 tablet (20 mg total) by mouth daily.  . [DISCONTINUED] ADVAIR DISKUS 250-50 MCG/DOSE AEPB Inhale 1 puff into the lungs as needed.    No facility-administered encounter medications on file as of 09/08/2016.      Objective: Blood pressure 110/76, pulse 66, temperature 97.9 F (36.6 C), temperature source Oral, resp. rate 18, height 5\' 5"  (1.651 m), weight 172 lb 1.6 oz (78.1 kg).  Patient is alert and in no acute distress. Conjunctiva is pink. Sclera is nonicteric Oropharyngeal mucosa is normal. No neck masses or thyromegaly  noted. Cardiac exam with regular rhythm normal S1 and S2. No murmur or gallop noted. Lungs are clear to auscultation. Abdomen is full but soft and nontender without organomegaly or masses. Trace edema around ankles.  Labs/studies Results: Recent H&H on file.   H&H on 10/16/2015 was 11 and 34.2.  Assessment:  #1. His stream of iron deficiency anemia secondary to GI blood loss from small bowel angiodysplasia. He has not required transfusion in several months. Hemoglobin in July last year was 11. He does not have any symptoms to suggest drop in H&H.  #2. Chronic GERD. He is  doing well with therapy.   Plan:  Patient will have CBC with next blood work into a half weeks. Hemoccult 1. Patient will call if he experiences melena or rectal bleeding or weakness. Office visit in one year.

## 2016-09-30 DIAGNOSIS — I10 Essential (primary) hypertension: Secondary | ICD-10-CM | POA: Diagnosis not present

## 2016-09-30 DIAGNOSIS — E1122 Type 2 diabetes mellitus with diabetic chronic kidney disease: Secondary | ICD-10-CM | POA: Diagnosis not present

## 2016-10-06 DIAGNOSIS — N183 Chronic kidney disease, stage 3 (moderate): Secondary | ICD-10-CM | POA: Diagnosis not present

## 2016-10-06 DIAGNOSIS — G629 Polyneuropathy, unspecified: Secondary | ICD-10-CM | POA: Diagnosis not present

## 2016-10-06 DIAGNOSIS — M179 Osteoarthritis of knee, unspecified: Secondary | ICD-10-CM | POA: Diagnosis not present

## 2016-10-06 DIAGNOSIS — I2510 Atherosclerotic heart disease of native coronary artery without angina pectoris: Secondary | ICD-10-CM | POA: Diagnosis not present

## 2016-10-06 DIAGNOSIS — E1122 Type 2 diabetes mellitus with diabetic chronic kidney disease: Secondary | ICD-10-CM | POA: Diagnosis not present

## 2016-10-06 DIAGNOSIS — I10 Essential (primary) hypertension: Secondary | ICD-10-CM | POA: Diagnosis not present

## 2016-10-06 DIAGNOSIS — M19249 Secondary osteoarthritis, unspecified hand: Secondary | ICD-10-CM | POA: Diagnosis not present

## 2016-10-06 DIAGNOSIS — R69 Illness, unspecified: Secondary | ICD-10-CM | POA: Diagnosis not present

## 2016-10-06 DIAGNOSIS — J449 Chronic obstructive pulmonary disease, unspecified: Secondary | ICD-10-CM | POA: Diagnosis not present

## 2016-10-06 DIAGNOSIS — D509 Iron deficiency anemia, unspecified: Secondary | ICD-10-CM | POA: Diagnosis not present

## 2016-12-25 DIAGNOSIS — E559 Vitamin D deficiency, unspecified: Secondary | ICD-10-CM | POA: Diagnosis not present

## 2016-12-25 DIAGNOSIS — D509 Iron deficiency anemia, unspecified: Secondary | ICD-10-CM | POA: Diagnosis not present

## 2016-12-25 DIAGNOSIS — R809 Proteinuria, unspecified: Secondary | ICD-10-CM | POA: Diagnosis not present

## 2016-12-25 DIAGNOSIS — N183 Chronic kidney disease, stage 3 (moderate): Secondary | ICD-10-CM | POA: Diagnosis not present

## 2016-12-25 DIAGNOSIS — I10 Essential (primary) hypertension: Secondary | ICD-10-CM | POA: Diagnosis not present

## 2016-12-25 DIAGNOSIS — Z79899 Other long term (current) drug therapy: Secondary | ICD-10-CM | POA: Diagnosis not present

## 2016-12-30 DIAGNOSIS — D638 Anemia in other chronic diseases classified elsewhere: Secondary | ICD-10-CM | POA: Diagnosis not present

## 2016-12-30 DIAGNOSIS — N183 Chronic kidney disease, stage 3 (moderate): Secondary | ICD-10-CM | POA: Diagnosis not present

## 2016-12-30 DIAGNOSIS — E559 Vitamin D deficiency, unspecified: Secondary | ICD-10-CM | POA: Diagnosis not present

## 2016-12-30 DIAGNOSIS — R809 Proteinuria, unspecified: Secondary | ICD-10-CM | POA: Diagnosis not present

## 2017-01-04 DIAGNOSIS — E1142 Type 2 diabetes mellitus with diabetic polyneuropathy: Secondary | ICD-10-CM | POA: Diagnosis not present

## 2017-01-04 DIAGNOSIS — E876 Hypokalemia: Secondary | ICD-10-CM | POA: Diagnosis not present

## 2017-01-13 DIAGNOSIS — Z720 Tobacco use: Secondary | ICD-10-CM | POA: Diagnosis not present

## 2017-01-13 DIAGNOSIS — E1140 Type 2 diabetes mellitus with diabetic neuropathy, unspecified: Secondary | ICD-10-CM | POA: Diagnosis not present

## 2017-01-13 DIAGNOSIS — D649 Anemia, unspecified: Secondary | ICD-10-CM | POA: Diagnosis not present

## 2017-01-13 DIAGNOSIS — J449 Chronic obstructive pulmonary disease, unspecified: Secondary | ICD-10-CM | POA: Diagnosis not present

## 2017-01-13 DIAGNOSIS — N183 Chronic kidney disease, stage 3 (moderate): Secondary | ICD-10-CM | POA: Diagnosis not present

## 2017-01-13 DIAGNOSIS — E1122 Type 2 diabetes mellitus with diabetic chronic kidney disease: Secondary | ICD-10-CM | POA: Diagnosis not present

## 2017-01-13 DIAGNOSIS — J069 Acute upper respiratory infection, unspecified: Secondary | ICD-10-CM | POA: Diagnosis not present

## 2017-01-13 DIAGNOSIS — R69 Illness, unspecified: Secondary | ICD-10-CM | POA: Diagnosis not present

## 2017-01-13 DIAGNOSIS — I2510 Atherosclerotic heart disease of native coronary artery without angina pectoris: Secondary | ICD-10-CM | POA: Diagnosis not present

## 2017-01-13 DIAGNOSIS — E782 Mixed hyperlipidemia: Secondary | ICD-10-CM | POA: Diagnosis not present

## 2017-02-03 NOTE — Progress Notes (Signed)
Cardiology Office Note  Date: 02/04/2017   ID: Donald Gaines, DOB [birth year], MRN [id number]  PCP: Celene Squibb, MD  Primary Cardiologist: Rozann Lesches, MD   Chief Complaint  Patient presents with  . Coronary Artery Disease  . Atrial Fibrillation    History of Present Illness: Donald Gaines is a 76 y.o. male last seen in May.  He presents for a routine follow-up visit.  States that he just "feels bad" most of the time.  Stays in his house a lot, is very inactive.  He tells me that he does follow with his PCP on a regular basis.  From a cardiac perspective he does not report palpitations and states that he is only used one nitroglycerin tablet since last visit.  He continues to smoke cigarettes and has significant COPD.  He states that his MDIs were adjusted recently.  He has chronic dyspnea on exertion.  Current cardiac regimen includes Coreg, Plavix, Lasix, Imdur, lisinopril, Crestor, and as needed nitroglycerin.  He is not anticoagulated for atrial fibrillation with prior history of GI bleeding and AVMs. He has been able to stay on Plavix.  Past Medical History:  Diagnosis Date  . Arteriovenous malformation small bowel    Capsule study 3/10  . Bilateral carotid artery stenosis   . BPH (benign prostatic hypertrophy)   . Candida esophagitis ([place])   . Cataract   . Chronic anemia   . Chronic diarrhea   . COPD (chronic obstructive pulmonary disease) ([place])   . Coronary atherosclerosis of native coronary artery    Multivessel, BMS SVG TO RCA 2000, reportedly  "2" additional stents in interim  . Dementia   . Depression   . Diabetes mellitus, type 2 ([place])   . Essential hypertension   . GERD (gastroesophageal reflux disease)   . GI bleeding    Cecal ulcers, arteriovenous malformations  . Glaucoma   . Hemorrhoids   . Hyperlipidemia   . Myocardial infarction ([place])    1994  . Portal hypertensive gastropathy ([place]) 11/08/2012   EGD. Dr. Laural Golden  . Restrictive lung  disease   . Sleep apnea   . TIA (transient ischemic attack)    2005    Past Surgical History:  Procedure Laterality Date  . CAPSULE ENDO  03/10  . cardiac catherization  04/2012  . CARDIAC CATHETERIZATION  04/05/1996 [name]   EF 45%, occluded SVG to circumflex, patent SVG to D1 and PDA and patent LIMA to LAD with severe native vessel disease.  Marland Kitchen CARDIAC CATHETERIZATION  08/22/1998 [name]   Mild LM, severe LAD,severe CX, occlude RCA, patient  SVG to diatal RCA, patent vein graft to D1 and patent LIMA to LAD.   Marland Kitchen CATARACT EXTRACTION W/PHACO Right 09/22/2012   Procedure: CATARACT EXTRACTION PHACO AND INTRAOCULAR LENS PLACEMENT (IOC);  Surgeon: Tonny Branch, MD;  Location: [facility];  Service: Ophthalmology;  Laterality: Right;  CDE: 11.43  . CATARACT EXTRACTION W/PHACO Left 10/17/2012   Procedure: CATARACT EXTRACTION PHACO AND INTRAOCULAR LENS PLACEMENT (IOC);  Surgeon: Tonny Branch, MD;  Location: [facility];  Service: Ophthalmology;  Laterality: Left;  CDE: 8.06  . COLONOSCOPY  APR 2008   SIMPLE ADENOMA, [place] TICS, IH  . COLONOSCOPY  FEB 2008 ANEMIA. MELENA    2 LARGE ILEOCEAL ULCERS 2o to ASA, [place] TICS, IH  . COLONOSCOPY  SEP 2009 TRANSFUSION DEP ANEMIA   AC AVM-ABLATED, [place] TICS, IH  . COLONOSCOPY WITH ESOPHAGOGASTRODUODENOSCOPY (EGD) N/A 11/08/2012   Procedure: COLONOSCOPY  WITH ESOPHAGOGASTRODUODENOSCOPY (EGD);  Surgeon: Rogene Houston, MD;  Location: [facility];  Service: Endoscopy;  Laterality: N/A;  250-rescheduled to 7:30 Ann to notify pt  . CORONARY ANGIOPLASTY WITH STENT PLACEMENT  08/22/1998 [name]   TEC stenting of the SVG to RCA-Last seen by cardiologist in 2010.  Marland Kitchen CORONARY ARTERY BYPASS GRAFT  1995-triple bypass   LIMA-LAD, LIMA to intermediate branch, SVG to PD and second OM,  . ESOPHAGOGASTRODUODENOSCOPY  SEP 09   DUODENAL LIPOMA  . ESOPHAGOGASTRODUODENOSCOPY N/A 09/27/2013   Procedure: ESOPHAGOGASTRODUODENOSCOPY (EGD) Push enteroscopy;  Surgeon: Rogene Houston, MD;   [facility]: [facility];  Service: Endoscopy;  Laterality: N/A;  . GIVENS CAPSULE STUDY N/A 11/24/2012   Procedure: GIVENS CAPSULE STUDY;  Surgeon: Rogene Houston, MD;  [facility]: [facility];  Service: Endoscopy;  Laterality: N/A;  730  . HOT HEMOSTASIS  09/27/2013   Procedure: HOT HEMOSTASIS (ARGON PLASMA COAGULATION/BICAP);  Surgeon: Rogene Houston, MD;  Location: [facility];  Service: Endoscopy;;  . HYDROGEN BREATH TEST  2009  . RIGHT INGUINAL HERNIA REPAIR    . UPPER GASTROINTESTINAL ENDOSCOPY  SEP 2009   GASTRIC AVM ABLATED, NL DUO Bx    Current Outpatient Medications  Medication Sig Dispense Refill  . albuterol (PROAIR HFA) 108 (90 BASE) MCG/ACT inhaler Inhale 2 puffs into the lungs every 6 (six) hours as needed for wheezing or shortness of breath.    Marland Kitchen albuterol (PROVENTIL) (2.5 MG/3ML) 0.083% nebulizer solution Take 3 mLs (2.5 mg total) by nebulization every 2 (two) hours as needed for wheezing or shortness of breath. 75 mL 12  . ALPRAZolam (XANAX) 0.5 MG tablet Take 0.5 mg by mouth 2 (two) times daily as needed for anxiety.     . carvedilol (COREG) 12.5 MG tablet Take 12.5 mg by mouth 2 (two) times daily with a meal.     . cetirizine (ZYRTEC) 10 MG tablet Take 10 mg by mouth at bedtime.     . clopidogrel (PLAVIX) 75 MG tablet Take 1 tablet (75 mg total) by mouth every morning.    . escitalopram (LEXAPRO) 5 MG tablet Take 5 mg by mouth daily.     . feeding supplement, ENSURE ENLIVE, (ENSURE ENLIVE) LIQD Take 237 mLs by mouth as needed (provide when pt asks for it (prefers chocolate)). 237 mL 12  . ferrous sulfate 325 (65 FE) MG tablet Take 325 mg by mouth daily with breakfast.    . finasteride (PROSCAR) 5 MG tablet Take 5 mg by mouth daily.    . Fluticasone-Umeclidin-Vilant (TRELEGY ELLIPTA) 100-62.5-25 MCG/INH AEPB Inhale into the lungs.    . furosemide (LASIX) 40 MG tablet Take 40 mg by mouth 2 (two) times daily.     Marland Kitchen gabapentin (NEURONTIN) 100 MG capsule Take 100 mg by mouth  at bedtime.     . isosorbide mononitrate (IMDUR) 60 MG 24 hr tablet Take 1 tablet (60 mg total) by mouth daily. 30 tablet 11  . lisinopril (PRINIVIL,ZESTRIL) 2.5 MG tablet Take 2.5 mg by mouth daily.    Marland Kitchen NITROSTAT 0.4 MG SL tablet Place 0.4 mg under the tongue every 5 (five) minutes as needed for chest pain.     Marland Kitchen omeprazole (PRILOSEC) 20 MG capsule Take 20 mg by mouth daily.     . rosuvastatin (CRESTOR) 20 MG tablet Take 1 tablet (20 mg total) by mouth daily. 90 tablet 0   No current facility-administered medications for this visit.    Allergies:  Penicillins  Social History: The patient  reports that he has been smoking cigarettes.  He has a 60.00 pack-year smoking history. he has never used smokeless tobacco. He reports that he uses drugs. Drug: Marijuana. He reports that he does not drink alcohol.   ROS:  Please see the history of present illness. Otherwise, complete review of systems is positive for fatigue, chronic shortness of breath.  All other systems are reviewed and negative.   Physical Exam: VS:  BP 100/62   Pulse 74   Ht 5\' 6"  (1.676 m)   Wt 184 lb (83.5 kg)   SpO2 97%   BMI 29.70 kg/m , BMI Body mass index is 29.7 kg/m.  Wt Readings from Last 3 Encounters:  02/04/17 184 lb (83.5 kg)  09/08/16 172 lb 1.6 oz (78.1 kg)  08/11/16 171 lb 6.4 oz (77.7 kg)    General: Chronically ill-appearing disheveled male, no distress. HEENT: Conjunctiva and lids normal, oropharynx clear with poor dentition. Neck: Supple, no elevated JVP or carotid bruits, no thyromegaly. Lungs: Decreased breath sounds with prolonged expiratory phase and end expiratory wheezes, nonlabored breathing at rest. Cardiac: Regular rate and rhythm, no S3, soft systolic murmur, no pericardial rub. Abdomen: Soft, nontender, bowel sounds present. Extremities: Trace ankle edema, distal pulses diminished. Skin: Warm and dry. Musculoskeletal: No kyphosis. Neuropsychiatric: Alert and oriented x3, fairly flat  affect.  ECG: I personally reviewed the tracing from 08/11/2016 which showed rate controlled atrial fibrillation with diffuse anterolateral ST-T wave abnormalities and PVC.  Recent Labwork:  July 2017: BUN 16, creatinine 1.66, potassium 4.0, hemoglobin 11.0  Other Studies Reviewed Today:  Carotid Dopplers 06/12/2015: 1-39% bilateral ICA stenoses.  Lexiscan Myoview 11/23/2013: IMPRESSION: 1.  Large inferolateral wall scar with no peri-infarct ischemia.  2. Decreased left ventricular systolic function with inferolateral wall hypokinesis.  3. Left ventricular ejection fraction 39%  4. High-risk stress test findings based on low ejection fraction, recommend correlate findings with echocardiogram. There is no current myocardium at jeopardy*.  Assessment and Plan:  1.  Chronic atrial fibrillation.  Continue strategy of heart rate control on Coreg.  He is not anticoagulated with a history of significant GI bleeding and AVMs.  2.  Ischemic heart disease status post CABG and percutaneous interventions.  Last ischemic testing was in 2015.  He does not report any progressive angina we will continue with medical therapy and observation.  3.  Ischemic cardiomyopathy, previous LVEF approximately 40%.  Follow-up echocardiogram will be obtained.  He is on Coreg, lisinopril, and Lasix.  4.  Long-standing tobacco abuse with COPD.  I did talk with him about smoking cessation today.  5.  Mixed hyperlipidemia, continues on Crestor with follow-up per Dr. [place] Crane.  Current medicines were reviewed with the patient today.   Orders Placed This Encounter  Procedures  . ECHOCARDIOGRAM COMPLETE    Disposition: Follow-up in 6 months.  Signed, Satira Sark, MD, [facility] 02/04/2017 11:20 AM    [facility] [facility] [place], [place], [place] [address] Phone: [phone]; Fax: [phone]

## 2017-02-04 ENCOUNTER — Ambulatory Visit (INDEPENDENT_AMBULATORY_CARE_PROVIDER_SITE_OTHER): Payer: Medicare HMO | Admitting: Cardiology

## 2017-02-04 ENCOUNTER — Encounter: Payer: Self-pay | Admitting: Cardiology

## 2017-02-04 VITALS — BP 100/62 | HR 74 | Ht 66.0 in | Wt 184.0 lb

## 2017-02-04 DIAGNOSIS — E782 Mixed hyperlipidemia: Secondary | ICD-10-CM | POA: Diagnosis not present

## 2017-02-04 DIAGNOSIS — I25119 Atherosclerotic heart disease of native coronary artery with unspecified angina pectoris: Secondary | ICD-10-CM | POA: Diagnosis not present

## 2017-02-04 DIAGNOSIS — F17200 Nicotine dependence, unspecified, uncomplicated: Secondary | ICD-10-CM

## 2017-02-04 DIAGNOSIS — I482 Chronic atrial fibrillation, unspecified: Secondary | ICD-10-CM

## 2017-02-04 DIAGNOSIS — R69 Illness, unspecified: Secondary | ICD-10-CM | POA: Diagnosis not present

## 2017-02-04 DIAGNOSIS — I255 Ischemic cardiomyopathy: Secondary | ICD-10-CM | POA: Diagnosis not present

## 2017-02-04 NOTE — Patient Instructions (Signed)

## 2017-02-10 ENCOUNTER — Other Ambulatory Visit: Payer: Self-pay

## 2017-02-10 ENCOUNTER — Ambulatory Visit (INDEPENDENT_AMBULATORY_CARE_PROVIDER_SITE_OTHER): Payer: Medicare HMO

## 2017-02-10 ENCOUNTER — Telehealth: Payer: Self-pay

## 2017-02-10 DIAGNOSIS — I255 Ischemic cardiomyopathy: Secondary | ICD-10-CM | POA: Diagnosis not present

## 2017-02-10 NOTE — Telephone Encounter (Signed)
Patient's sister notified. Routed to PCP

## 2017-02-10 NOTE — Telephone Encounter (Signed)
-----   Message from Acquanetta Chain, LPN sent at 27/02/9289  2:54 PM EST -----   ----- Message ----- From: Satira Sark, MD Sent: 02/10/2017  11:52 AM To: Merlene Laughter, LPN  Results reviewed.  Compared to previous study, LVEF is now normal range at 65-70%.  We will continue with medical therapy and observation. A copy of this test should be forwarded to Celene Squibb, MD.

## 2017-03-20 IMAGING — DX DG LUMBAR SPINE COMPLETE 4+V
5 series · 5 of 5 positions shown · non-contrast
Comparison: 07/13/2013

CLINICAL DATA: Fell backwards on steps

EXAM:
LUMBAR SPINE - COMPLETE 4+ VIEW

[l-spine ap]
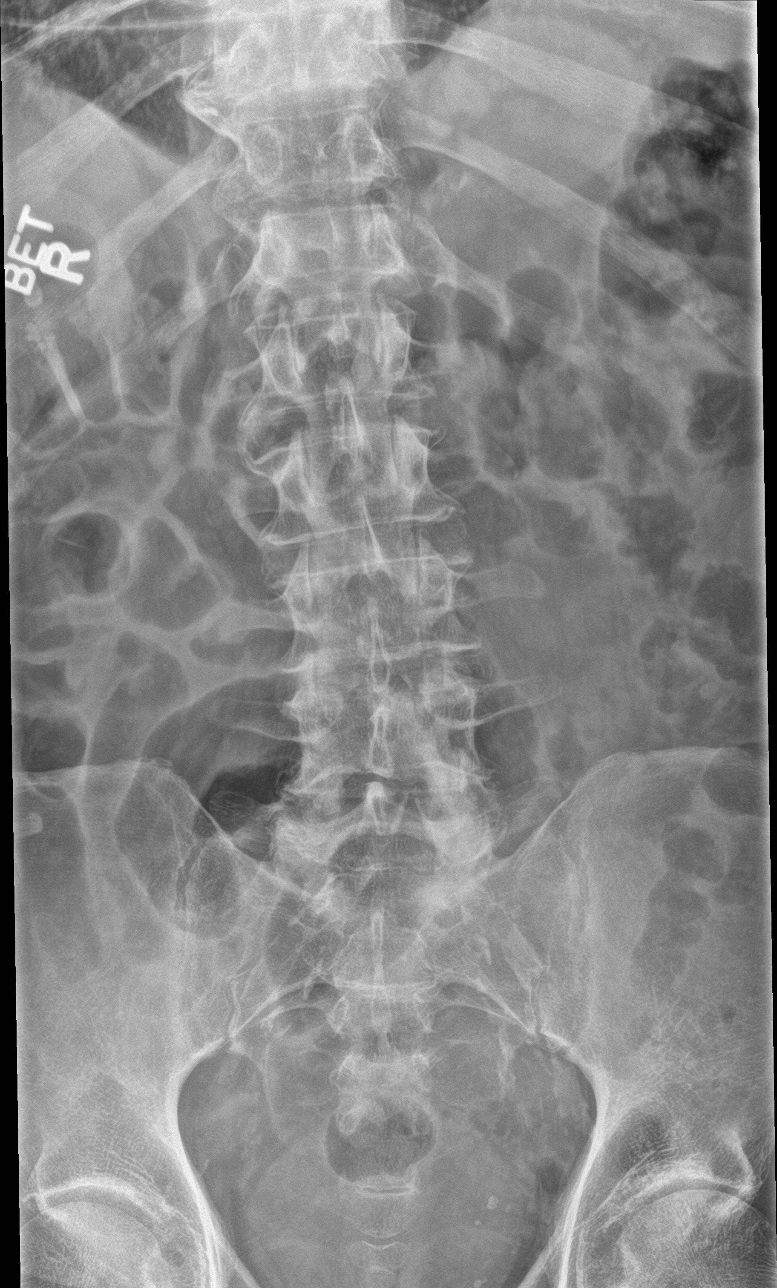

[l-spine obl (1 of 2)]
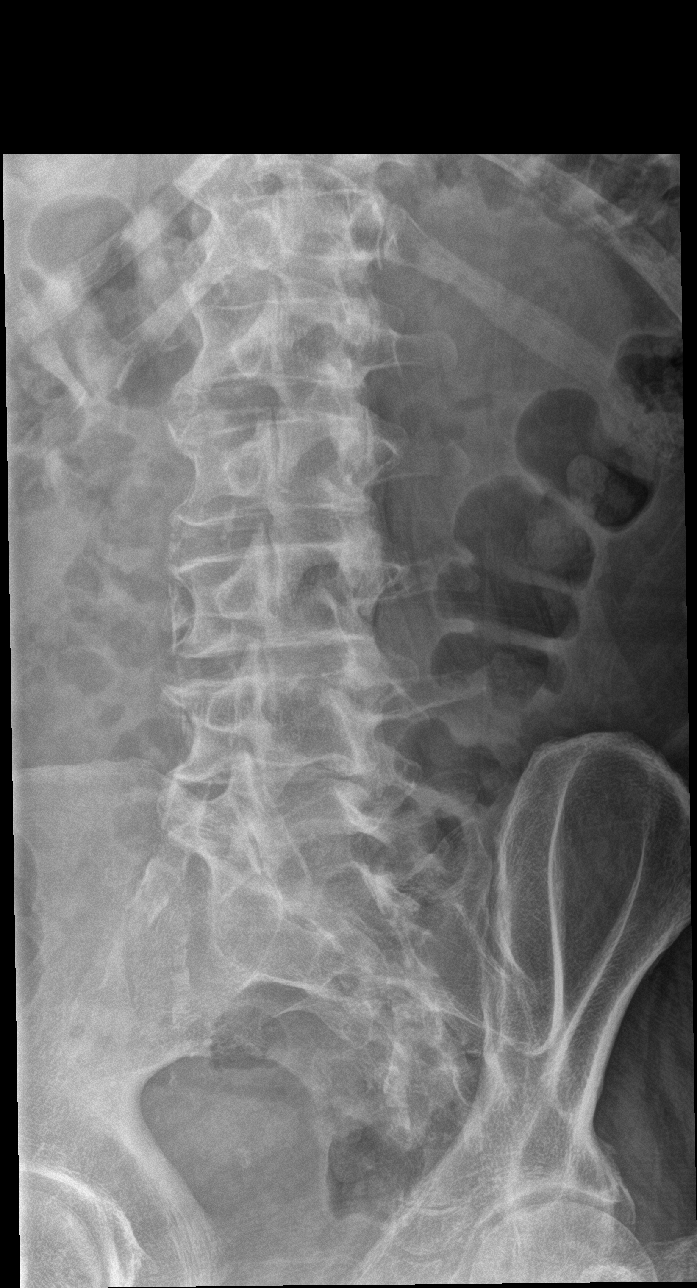

[l-spine obl (2 of 2)]
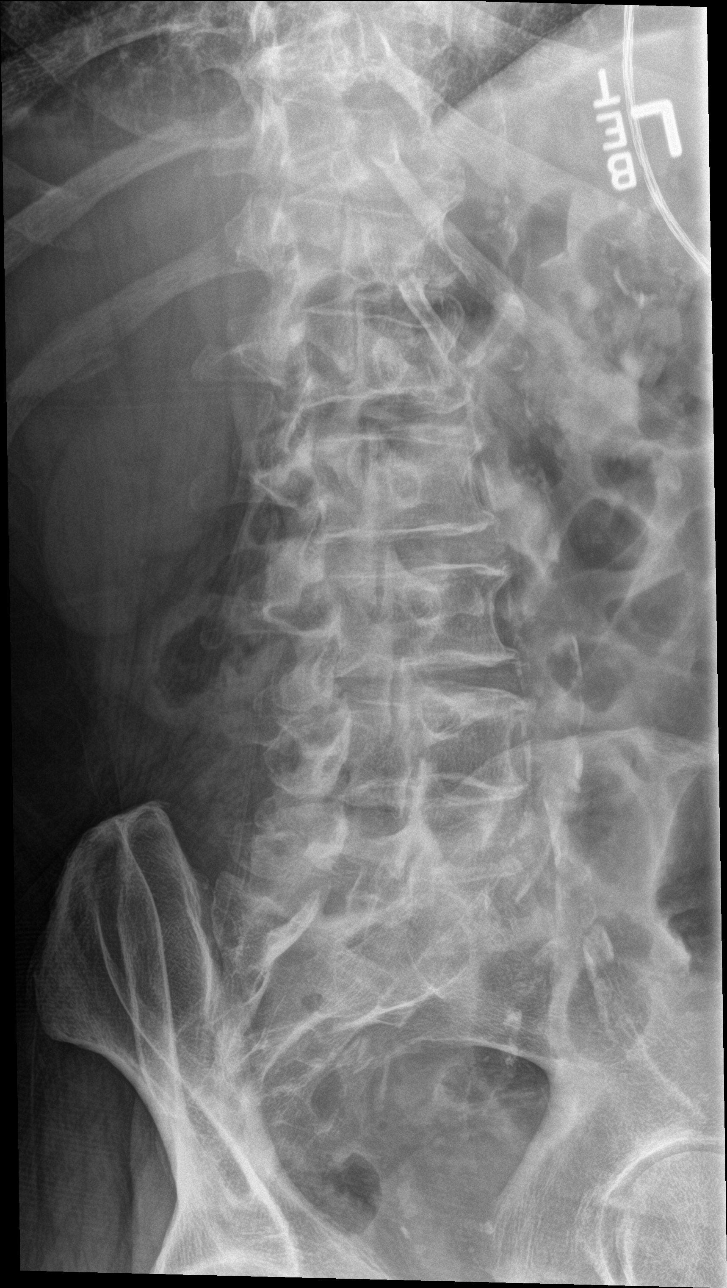

[l-spine lat]
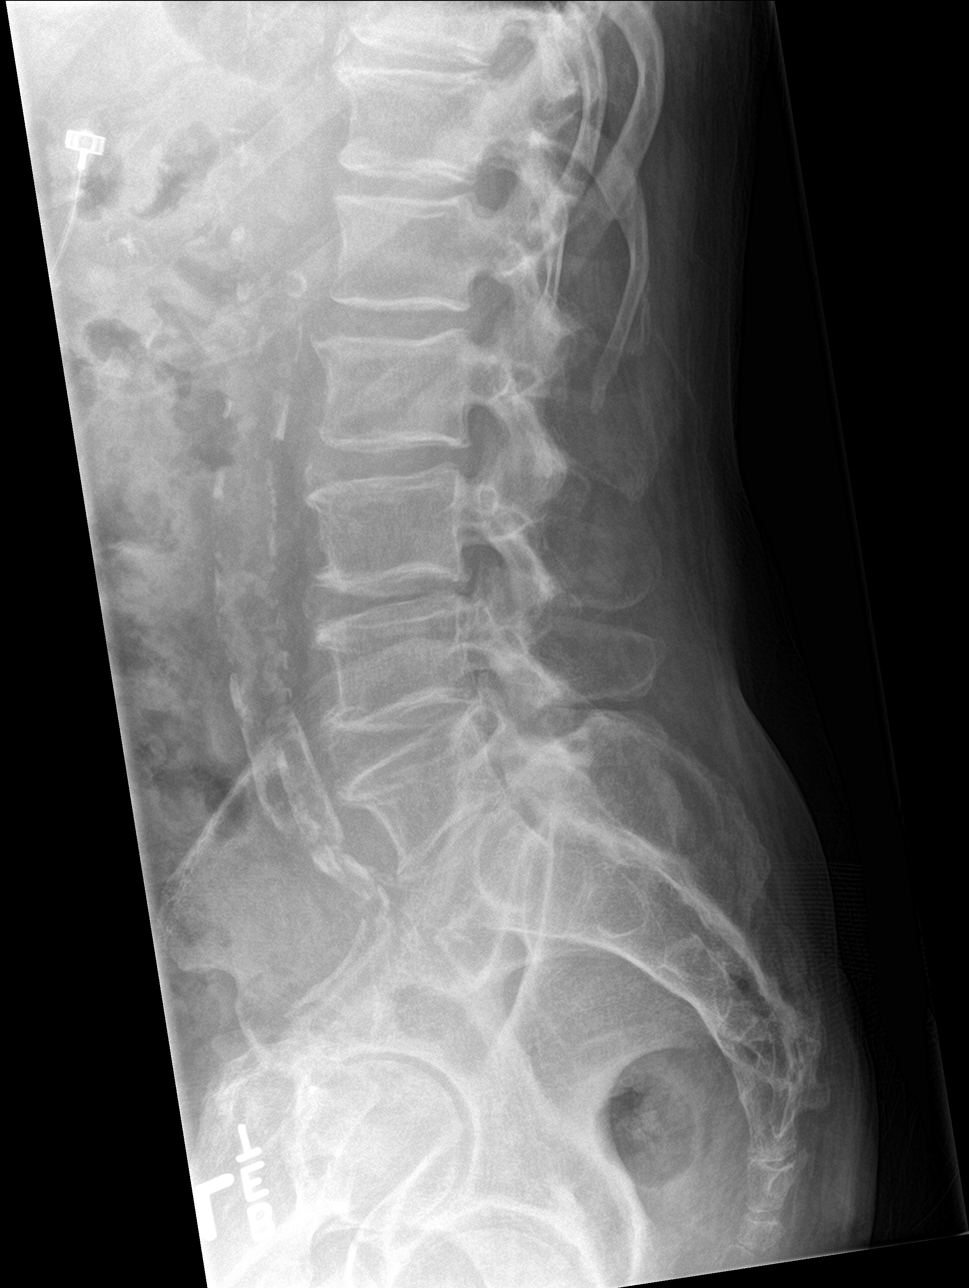

[l-spine spot]
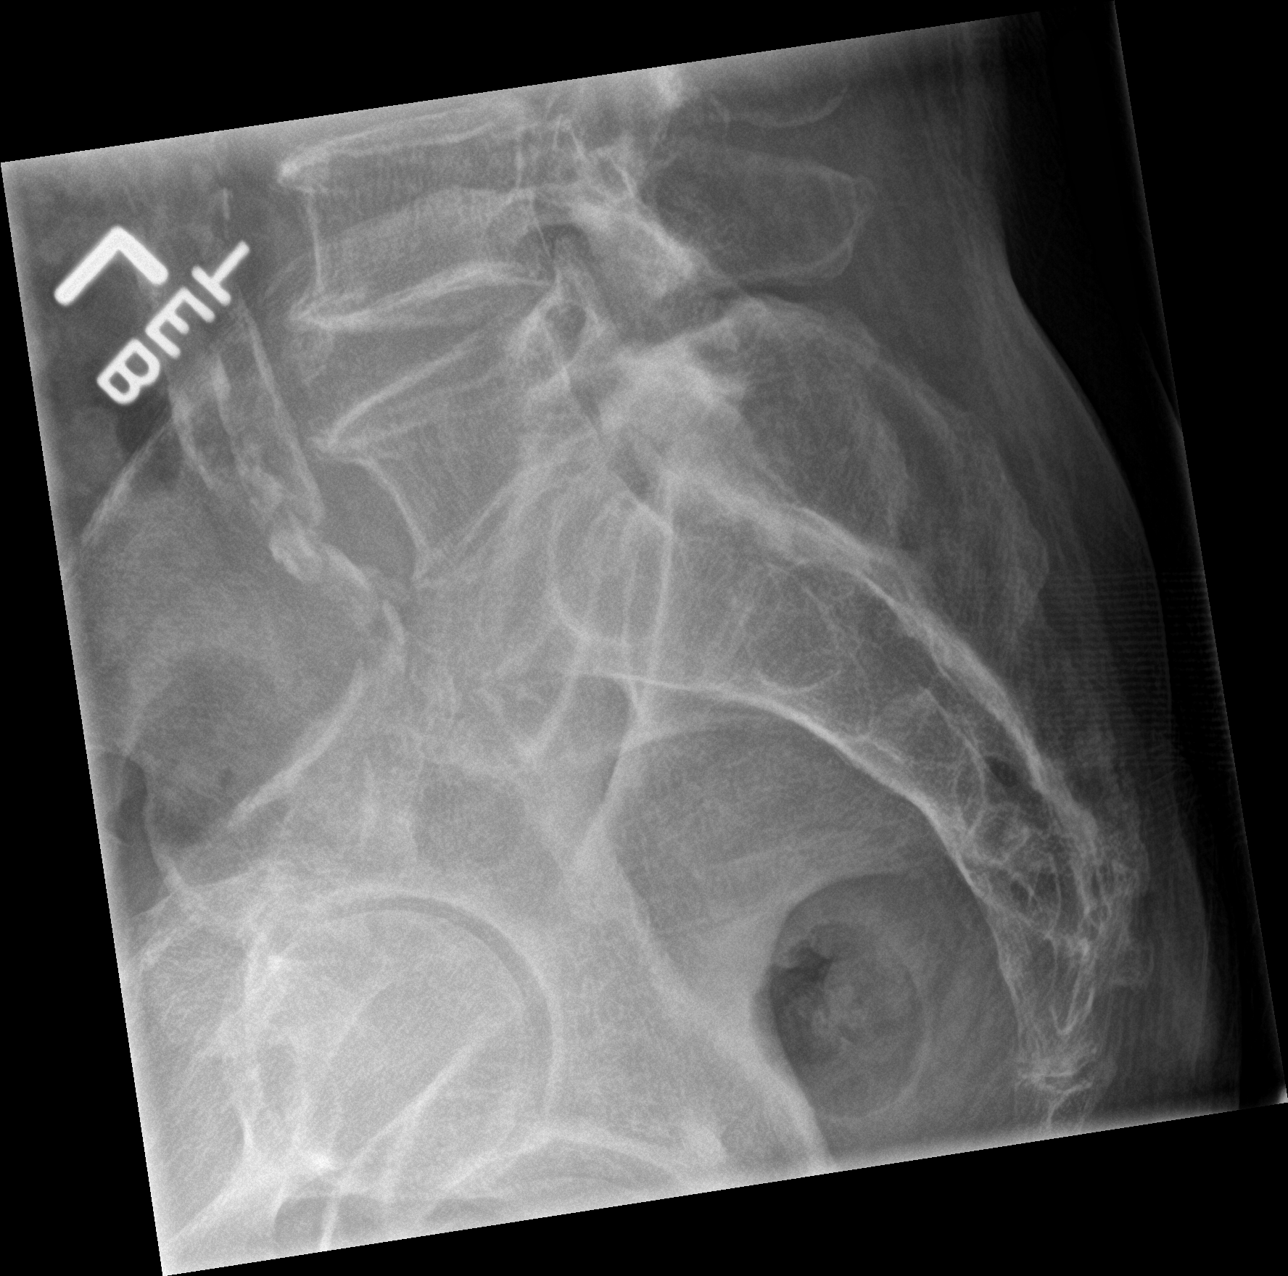

[5 of 5 positions shown; findings below may reference images not displayed]

FINDINGS: There is no evidence of lumbar spine fracture. Alignment is normal.
Intervertebral disc spaces are maintained.
IMPRESSION: Negative.

## 2017-04-10 DIAGNOSIS — E559 Vitamin D deficiency, unspecified: Secondary | ICD-10-CM | POA: Diagnosis not present

## 2017-04-10 DIAGNOSIS — R809 Proteinuria, unspecified: Secondary | ICD-10-CM | POA: Diagnosis not present

## 2017-04-10 DIAGNOSIS — D509 Iron deficiency anemia, unspecified: Secondary | ICD-10-CM | POA: Diagnosis not present

## 2017-04-10 DIAGNOSIS — Z79899 Other long term (current) drug therapy: Secondary | ICD-10-CM | POA: Diagnosis not present

## 2017-04-10 DIAGNOSIS — N183 Chronic kidney disease, stage 3 (moderate): Secondary | ICD-10-CM | POA: Diagnosis not present

## 2017-04-10 DIAGNOSIS — I10 Essential (primary) hypertension: Secondary | ICD-10-CM | POA: Diagnosis not present

## 2017-04-15 DIAGNOSIS — J449 Chronic obstructive pulmonary disease, unspecified: Secondary | ICD-10-CM | POA: Diagnosis not present

## 2017-04-15 DIAGNOSIS — M179 Osteoarthritis of knee, unspecified: Secondary | ICD-10-CM | POA: Diagnosis not present

## 2017-04-15 DIAGNOSIS — R69 Illness, unspecified: Secondary | ICD-10-CM | POA: Diagnosis not present

## 2017-04-15 DIAGNOSIS — E876 Hypokalemia: Secondary | ICD-10-CM | POA: Diagnosis not present

## 2017-04-15 DIAGNOSIS — D509 Iron deficiency anemia, unspecified: Secondary | ICD-10-CM | POA: Diagnosis not present

## 2017-04-15 DIAGNOSIS — N183 Chronic kidney disease, stage 3 (moderate): Secondary | ICD-10-CM | POA: Diagnosis not present

## 2017-04-15 DIAGNOSIS — R5382 Chronic fatigue, unspecified: Secondary | ICD-10-CM | POA: Diagnosis not present

## 2017-04-15 DIAGNOSIS — E782 Mixed hyperlipidemia: Secondary | ICD-10-CM | POA: Diagnosis not present

## 2017-04-15 DIAGNOSIS — E1142 Type 2 diabetes mellitus with diabetic polyneuropathy: Secondary | ICD-10-CM | POA: Diagnosis not present

## 2017-04-15 DIAGNOSIS — E1122 Type 2 diabetes mellitus with diabetic chronic kidney disease: Secondary | ICD-10-CM | POA: Diagnosis not present

## 2017-04-19 DIAGNOSIS — N183 Chronic kidney disease, stage 3 (moderate): Secondary | ICD-10-CM | POA: Diagnosis not present

## 2017-04-19 DIAGNOSIS — E876 Hypokalemia: Secondary | ICD-10-CM | POA: Diagnosis not present

## 2017-04-19 DIAGNOSIS — R5382 Chronic fatigue, unspecified: Secondary | ICD-10-CM | POA: Diagnosis not present

## 2017-04-19 DIAGNOSIS — E782 Mixed hyperlipidemia: Secondary | ICD-10-CM | POA: Diagnosis not present

## 2017-04-19 DIAGNOSIS — D509 Iron deficiency anemia, unspecified: Secondary | ICD-10-CM | POA: Diagnosis not present

## 2017-04-19 DIAGNOSIS — M179 Osteoarthritis of knee, unspecified: Secondary | ICD-10-CM | POA: Diagnosis not present

## 2017-04-19 DIAGNOSIS — J449 Chronic obstructive pulmonary disease, unspecified: Secondary | ICD-10-CM | POA: Diagnosis not present

## 2017-04-19 DIAGNOSIS — R69 Illness, unspecified: Secondary | ICD-10-CM | POA: Diagnosis not present

## 2017-04-19 DIAGNOSIS — E1122 Type 2 diabetes mellitus with diabetic chronic kidney disease: Secondary | ICD-10-CM | POA: Diagnosis not present

## 2017-04-19 DIAGNOSIS — E1142 Type 2 diabetes mellitus with diabetic polyneuropathy: Secondary | ICD-10-CM | POA: Diagnosis not present

## 2017-04-23 ENCOUNTER — Other Ambulatory Visit (HOSPITAL_COMMUNITY): Payer: Self-pay | Admitting: Nephrology

## 2017-04-23 DIAGNOSIS — I10 Essential (primary) hypertension: Secondary | ICD-10-CM | POA: Diagnosis not present

## 2017-04-23 DIAGNOSIS — N183 Chronic kidney disease, stage 3 unspecified: Secondary | ICD-10-CM

## 2017-04-23 DIAGNOSIS — Z720 Tobacco use: Secondary | ICD-10-CM | POA: Diagnosis not present

## 2017-04-23 DIAGNOSIS — R809 Proteinuria, unspecified: Secondary | ICD-10-CM | POA: Diagnosis not present

## 2017-04-30 ENCOUNTER — Ambulatory Visit (HOSPITAL_COMMUNITY)
Admission: RE | Admit: 2017-04-30 | Discharge: 2017-04-30 | Disposition: A | Discharge status: Home / Self Care | Payer: Medicare HMO | Source: Ambulatory Visit | Attending: Nephrology | Admitting: Nephrology

## 2017-04-30 DIAGNOSIS — N183 Chronic kidney disease, stage 3 unspecified: Secondary | ICD-10-CM

## 2017-04-30 DIAGNOSIS — N281 Cyst of kidney, acquired: Secondary | ICD-10-CM | POA: Insufficient documentation

## 2017-04-30 DIAGNOSIS — N200 Calculus of kidney: Secondary | ICD-10-CM | POA: Diagnosis not present

## 2017-04-30 DIAGNOSIS — I10 Essential (primary) hypertension: Secondary | ICD-10-CM | POA: Diagnosis not present

## 2017-04-30 DIAGNOSIS — Z79899 Other long term (current) drug therapy: Secondary | ICD-10-CM | POA: Diagnosis not present

## 2017-05-07 DIAGNOSIS — N179 Acute kidney failure, unspecified: Secondary | ICD-10-CM | POA: Diagnosis not present

## 2017-05-07 DIAGNOSIS — N183 Chronic kidney disease, stage 3 (moderate): Secondary | ICD-10-CM | POA: Diagnosis not present

## 2017-05-07 DIAGNOSIS — Z720 Tobacco use: Secondary | ICD-10-CM | POA: Diagnosis not present

## 2017-05-07 DIAGNOSIS — N200 Calculus of kidney: Secondary | ICD-10-CM | POA: Diagnosis not present

## 2017-07-02 DIAGNOSIS — E1142 Type 2 diabetes mellitus with diabetic polyneuropathy: Secondary | ICD-10-CM | POA: Diagnosis not present

## 2017-07-02 DIAGNOSIS — E782 Mixed hyperlipidemia: Secondary | ICD-10-CM | POA: Diagnosis not present

## 2017-07-02 DIAGNOSIS — M179 Osteoarthritis of knee, unspecified: Secondary | ICD-10-CM | POA: Diagnosis not present

## 2017-07-02 DIAGNOSIS — N183 Chronic kidney disease, stage 3 (moderate): Secondary | ICD-10-CM | POA: Diagnosis not present

## 2017-07-02 DIAGNOSIS — R5382 Chronic fatigue, unspecified: Secondary | ICD-10-CM | POA: Diagnosis not present

## 2017-07-02 DIAGNOSIS — E1122 Type 2 diabetes mellitus with diabetic chronic kidney disease: Secondary | ICD-10-CM | POA: Diagnosis not present

## 2017-07-02 DIAGNOSIS — E876 Hypokalemia: Secondary | ICD-10-CM | POA: Diagnosis not present

## 2017-07-02 DIAGNOSIS — D509 Iron deficiency anemia, unspecified: Secondary | ICD-10-CM | POA: Diagnosis not present

## 2017-07-02 DIAGNOSIS — R69 Illness, unspecified: Secondary | ICD-10-CM | POA: Diagnosis not present

## 2017-07-02 DIAGNOSIS — J449 Chronic obstructive pulmonary disease, unspecified: Secondary | ICD-10-CM | POA: Diagnosis not present

## 2017-07-06 DIAGNOSIS — I10 Essential (primary) hypertension: Secondary | ICD-10-CM | POA: Diagnosis not present

## 2017-07-06 DIAGNOSIS — E1142 Type 2 diabetes mellitus with diabetic polyneuropathy: Secondary | ICD-10-CM | POA: Diagnosis not present

## 2017-07-06 DIAGNOSIS — R69 Illness, unspecified: Secondary | ICD-10-CM | POA: Diagnosis not present

## 2017-07-06 DIAGNOSIS — E782 Mixed hyperlipidemia: Secondary | ICD-10-CM | POA: Diagnosis not present

## 2017-07-06 DIAGNOSIS — Z720 Tobacco use: Secondary | ICD-10-CM | POA: Diagnosis not present

## 2017-07-06 DIAGNOSIS — J019 Acute sinusitis, unspecified: Secondary | ICD-10-CM | POA: Diagnosis not present

## 2017-07-06 DIAGNOSIS — J449 Chronic obstructive pulmonary disease, unspecified: Secondary | ICD-10-CM | POA: Diagnosis not present

## 2017-07-06 DIAGNOSIS — E1122 Type 2 diabetes mellitus with diabetic chronic kidney disease: Secondary | ICD-10-CM | POA: Diagnosis not present

## 2017-07-06 DIAGNOSIS — I2510 Atherosclerotic heart disease of native coronary artery without angina pectoris: Secondary | ICD-10-CM | POA: Diagnosis not present

## 2017-07-06 DIAGNOSIS — N183 Chronic kidney disease, stage 3 (moderate): Secondary | ICD-10-CM | POA: Diagnosis not present

## 2017-08-08 NOTE — Progress Notes (Signed)
Cardiology Office Note  Date: 08/09/2017   ID: Donald Gaines, DOB Sep 18, 1940, MRN [id number]  PCP: Celene Squibb, MD  Primary Cardiologist: Rozann Lesches, MD   Chief Complaint  Patient presents with  . Atrial Fibrillation    History of Present Illness: Donald Gaines is a 77 y.o. male last seen in November 2018.  He is here for a routine visit.  Reports nitroglycerin use on 3 occasions since I last saw him, typically has angina in the evenings.  He is very sedentary, states that he does not sleep well, also limited by arthritic knee pain.  He is not anticoagulated for atrial fibrillation with prior history of GI bleeding and AVMs.He has been able to stay on Plavix however.  Follow-up echocardiogram in November 2018 showed normalization of LVEF in the range of 65-70%.  I went over this result with him today as well.  I personally reviewed his ECG today which shows rate controlled atrial fibrillation with incomplete right bundle branch block, diffuse nonspecific ST-T changes.  Current cardiac regimen includes Coreg, Plavix, Lasix, Imdur, and Crestor.  He had lab work in April per Dr. Nevada Crane, reviewed below.  Past Medical History:  Diagnosis Date  . Arteriovenous malformation small bowel    Capsule study 3/10  . Bilateral carotid artery stenosis   . BPH (benign prostatic hypertrophy)   . Candida esophagitis ([place])   . Cataract   . Chronic anemia   . Chronic diarrhea   . COPD (chronic obstructive pulmonary disease) ([place])   . Coronary atherosclerosis of native coronary artery    Multivessel, BMS SVG TO RCA 2000, reportedly  "2" additional stents in interim  . Dementia   . Depression   . Diabetes mellitus, type 2 ([place])   . Essential hypertension   . GERD (gastroesophageal reflux disease)   . GI bleeding    Cecal ulcers, arteriovenous malformations  . Glaucoma   . Hemorrhoids   . Hyperlipidemia   . Myocardial infarction ([place])    1994  . Portal hypertensive  gastropathy ([place]) 11/08/2012   EGD. Dr. Laural Golden  . Restrictive lung disease   . Sleep apnea   . TIA (transient ischemic attack)    2005    Past Surgical History:  Procedure Laterality Date  . CAPSULE ENDO  03/10  . cardiac catherization  04/2012  . CARDIAC CATHETERIZATION  04/05/1996 [name]   EF 45%, occluded SVG to circumflex, patent SVG to D1 and PDA and patent LIMA to LAD with severe native vessel disease.  Marland Kitchen CARDIAC CATHETERIZATION  08/22/1998 [name]   Mild LM, severe LAD,severe CX, occlude RCA, patient  SVG to diatal RCA, patent vein graft to D1 and patent LIMA to LAD.   Marland Kitchen CATARACT EXTRACTION W/PHACO Right 09/22/2012   Procedure: CATARACT EXTRACTION PHACO AND INTRAOCULAR LENS PLACEMENT (IOC);  Surgeon: Tonny Branch, MD;  Location: [facility];  Service: Ophthalmology;  Laterality: Right;  CDE: 11.43  . CATARACT EXTRACTION W/PHACO Left 10/17/2012   Procedure: CATARACT EXTRACTION PHACO AND INTRAOCULAR LENS PLACEMENT (IOC);  Surgeon: Tonny Branch, MD;  Location: [facility];  Service: Ophthalmology;  Laterality: Left;  CDE: 8.06  . COLONOSCOPY  APR 2008   SIMPLE ADENOMA, [place] TICS, IH  . COLONOSCOPY  FEB 2008 ANEMIA. MELENA    2 LARGE ILEOCEAL ULCERS 2o to ASA, [place] TICS, IH  . COLONOSCOPY  SEP 2009 TRANSFUSION DEP ANEMIA   AC AVM-ABLATED, [place] TICS, IH  . COLONOSCOPY WITH ESOPHAGOGASTRODUODENOSCOPY (EGD) N/A 11/08/2012  Procedure: COLONOSCOPY WITH ESOPHAGOGASTRODUODENOSCOPY (EGD);  Surgeon: Rogene Houston, MD;  Location: [facility];  Service: Endoscopy;  Laterality: N/A;  250-rescheduled to 7:30 Ann to notify pt  . CORONARY ANGIOPLASTY WITH STENT PLACEMENT  08/22/1998 [name]   TEC stenting of the SVG to RCA-Last seen by cardiologist in 2010.  Marland Kitchen CORONARY ARTERY BYPASS GRAFT  1995-triple bypass   LIMA-LAD, LIMA to intermediate branch, SVG to PD and second OM,  . ESOPHAGOGASTRODUODENOSCOPY  SEP 09   DUODENAL LIPOMA  . ESOPHAGOGASTRODUODENOSCOPY N/A 09/27/2013   Procedure:  ESOPHAGOGASTRODUODENOSCOPY (EGD) Push enteroscopy;  Surgeon: Rogene Houston, MD;  [facility]: [facility];  Service: Endoscopy;  Laterality: N/A;  . GIVENS CAPSULE STUDY N/A 11/24/2012   Procedure: GIVENS CAPSULE STUDY;  Surgeon: Rogene Houston, MD;  [facility]: [facility];  Service: Endoscopy;  Laterality: N/A;  730  . HOT HEMOSTASIS  09/27/2013   Procedure: HOT HEMOSTASIS (ARGON PLASMA COAGULATION/BICAP);  Surgeon: Rogene Houston, MD;  Location: [facility];  Service: Endoscopy;;  . HYDROGEN BREATH TEST  2009  . RIGHT INGUINAL HERNIA REPAIR    . UPPER GASTROINTESTINAL ENDOSCOPY  SEP 2009   GASTRIC AVM ABLATED, NL DUO Bx    Current Outpatient Medications  Medication Sig Dispense Refill  . albuterol (PROAIR HFA) 108 (90 BASE) MCG/ACT inhaler Inhale 2 puffs into the lungs every 6 (six) hours as needed for wheezing or shortness of breath.    Marland Kitchen albuterol (PROVENTIL) (2.5 MG/3ML) 0.083% nebulizer solution Take 3 mLs (2.5 mg total) by nebulization every 2 (two) hours as needed for wheezing or shortness of breath. 75 mL 12  . ALPRAZolam (XANAX) 0.5 MG tablet Take 0.5 mg by mouth 2 (two) times daily as needed for anxiety.     . carvedilol (COREG) 12.5 MG tablet Take 12.5 mg by mouth 2 (two) times daily with a meal.     . cetirizine (ZYRTEC) 10 MG tablet Take 10 mg by mouth at bedtime.     . clopidogrel (PLAVIX) 75 MG tablet Take 1 tablet (75 mg total) by mouth every morning.    . escitalopram (LEXAPRO) 5 MG tablet Take 5 mg by mouth daily.     . feeding supplement, ENSURE ENLIVE, (ENSURE ENLIVE) LIQD Take 237 mLs by mouth as needed (provide when pt asks for it (prefers chocolate)). 237 mL 12  . ferrous sulfate 325 (65 FE) MG tablet Take 325 mg by mouth daily with breakfast.    . finasteride (PROSCAR) 5 MG tablet Take 5 mg by mouth daily.    . Fluticasone-Umeclidin-Vilant (TRELEGY ELLIPTA) 100-62.5-25 MCG/INH AEPB Inhale into the lungs.    . furosemide (LASIX) 40 MG tablet Take 40 mg by mouth  daily.    Marland Kitchen gabapentin (NEURONTIN) 100 MG capsule Take 100 mg by mouth at bedtime.     . isosorbide mononitrate (IMDUR) 60 MG 24 hr tablet Take 1 tablet (60 mg total) by mouth daily. 30 tablet 11  . lisinopril (PRINIVIL,ZESTRIL) 2.5 MG tablet Take 2.5 mg by mouth daily.    Marland Kitchen NITROSTAT 0.4 MG SL tablet Place 0.4 mg under the tongue every 5 (five) minutes as needed for chest pain.     Marland Kitchen omeprazole (PRILOSEC) 20 MG capsule Take 20 mg by mouth daily.     . rosuvastatin (CRESTOR) 20 MG tablet Take 1 tablet (20 mg total) by mouth daily. 90 tablet 0   No current facility-administered medications for this visit.    Allergies:  Penicillins  Social History: The patient  reports that he has been smoking cigarettes.  He has a 60.00 pack-year smoking history. He has never used smokeless tobacco. He reports that he has current or past drug history. Drug: Marijuana. He reports that he does not drink alcohol.   ROS:  Please see the history of present illness. Otherwise, complete review of systems is positive for arthritic knee pain.  All other systems are reviewed and negative.   Physical Exam: VS:  BP (!) 108/58   Pulse 67   Ht 5\' 5"  (1.651 m)   Wt 184 lb (83.5 kg)   SpO2 98%   BMI 30.62 kg/m , BMI Body mass index is 30.62 kg/m.  Wt Readings from Last 3 Encounters:  08/09/17 184 lb (83.5 kg)  02/04/17 184 lb (83.5 kg)  09/08/16 172 lb 1.6 oz (78.1 kg)    General: Chronically ill-appearing male, appears comfortable at rest. HEENT: Conjunctiva and lids normal, oropharynx clear with poor dentition. Neck: Supple, no elevated JVP or carotid bruits, no thyromegaly. Lungs: No wheezing, prolonged expiratory phase, nonlabored breathing at rest. Cardiac: Irregularly irregular, no S3, soft systolic murmur. Abdomen: Soft, nontender, bowel sounds present. Extremities: Trace ankle edema, distal pulses 1-2+. Skin: Warm and dry. Musculoskeletal: No kyphosis. Neuropsychiatric: Alert and oriented x3, affect  grossly appropriate.  ECG: I personally reviewed the tracing from 08/11/2016 which showed rate controlled atrial fibrillation with diffuse anterolateral ST-T wave abnormalities and PVC.  Recent Labwork:  April 2019: Hgb 11.4, platelets 176, BUN 13, creatinine 1.92, potassium 4,2 AST 21, ALT 8, cholesterol 113, triglycerides 84, HDL 40, LDL 56, Hgb A1c 6.5  Other Studies Reviewed Today:  Lexiscan Myoview 11/23/2013: IMPRESSION: 1. Large inferolateral wall scar with no peri-infarct ischemia.  2. Decreased left ventricular systolic function with inferolateral wall hypokinesis.  3. Left ventricular ejection fraction 39%  4. High-risk stress test findings based on low ejection fraction, recommend correlate findings with echocardiogram. There is no current myocardium at jeopardy*.  Echocardiogram 02/10/2017: Study Conclusions  - Left ventricle: The cavity size was normal. Wall thickness was   increased in a pattern of moderate LVH. Systolic function was   vigorous. The estimated ejection fraction was in the range of 65%   to 70%. Although no diagnostic regional wall motion abnormality   was identified, this possibility cannot be completely excluded on   the basis of this study. The study is not technically sufficient   to allow evaluation of LV diastolic function. - Aortic valve: Mildly calcified annulus. Trileaflet; mildly   calcified leaflets. - Mitral valve: Mildly thickened leaflets . There was mild   regurgitation. - Left atrium: The atrium was moderately dilated. - Atrial septum: No defect or patent foramen ovale was identified. - Tricuspid valve: There was mild regurgitation. Peak RV-RA   gradient (S): 30 mm Hg. - Inferior vena cava: Not well visualized. - Pericardium, extracardiac: There was no pericardial effusion.  Impressions:  - Moderate LVH with LVEF 65-70%. Indeterminate diastolic function.   Moderate left atrial enlargement. Mildly thickened mitral    leaflets with mild mitral regurgitation. Mildly sclerotic aortic   valve. Mild tricuspid regurgitation with RV-RA gradient 30 mmHg.  Assessment and Plan:  1.  CAD status post CABG and PCI.  He reports occasional angina symptoms, no increasing nitroglycerin use over the last 6 months.  ECG reviewed.  Last formal ischemic testing was was in 2015 without ischemic defects and LVEF has normalized more recently.  Would continue with medical therapy and observation  for now.  2.  Chronic atrial fibrillation, heart rate well controlled on Coreg.  He is not anticoagulated with history of GI bleeding due to AVMs.  3.  Tobacco abuse and COPD.  We have discussed smoking cessation.  He has not been able to quit.  4.  Mixed hyperlipidemia, doing well on Crestor.  Recent LDL 56.  Current medicines were reviewed with the patient today.   Orders Placed This Encounter  Procedures  . EKG 12-Lead    Disposition: Follow-up in 6 months.  Signed, Satira Sark, MD, [facility] 08/09/2017 8:44 AM    [facility] [place], [place], [place] [address] Phone: [phone]; Fax: [phone]

## 2017-08-09 ENCOUNTER — Ambulatory Visit (INDEPENDENT_AMBULATORY_CARE_PROVIDER_SITE_OTHER): Payer: Medicare HMO | Admitting: Cardiology

## 2017-08-09 ENCOUNTER — Encounter: Payer: Self-pay | Admitting: Cardiology

## 2017-08-09 VITALS — BP 108/58 | HR 67 | Ht 65.0 in | Wt 184.0 lb

## 2017-08-09 DIAGNOSIS — I482 Chronic atrial fibrillation, unspecified: Secondary | ICD-10-CM

## 2017-08-09 DIAGNOSIS — F17200 Nicotine dependence, unspecified, uncomplicated: Secondary | ICD-10-CM

## 2017-08-09 DIAGNOSIS — E782 Mixed hyperlipidemia: Secondary | ICD-10-CM | POA: Diagnosis not present

## 2017-08-09 DIAGNOSIS — R69 Illness, unspecified: Secondary | ICD-10-CM | POA: Diagnosis not present

## 2017-08-09 DIAGNOSIS — I25119 Atherosclerotic heart disease of native coronary artery with unspecified angina pectoris: Secondary | ICD-10-CM | POA: Diagnosis not present

## 2017-08-09 NOTE — Patient Instructions (Signed)

## 2017-09-01 DIAGNOSIS — Z79899 Other long term (current) drug therapy: Secondary | ICD-10-CM | POA: Diagnosis not present

## 2017-09-01 DIAGNOSIS — R809 Proteinuria, unspecified: Secondary | ICD-10-CM | POA: Diagnosis not present

## 2017-09-01 DIAGNOSIS — D509 Iron deficiency anemia, unspecified: Secondary | ICD-10-CM | POA: Diagnosis not present

## 2017-09-01 DIAGNOSIS — E559 Vitamin D deficiency, unspecified: Secondary | ICD-10-CM | POA: Diagnosis not present

## 2017-09-01 DIAGNOSIS — N183 Chronic kidney disease, stage 3 (moderate): Secondary | ICD-10-CM | POA: Diagnosis not present

## 2017-09-01 DIAGNOSIS — I10 Essential (primary) hypertension: Secondary | ICD-10-CM | POA: Diagnosis not present

## 2017-09-07 ENCOUNTER — Encounter (INDEPENDENT_AMBULATORY_CARE_PROVIDER_SITE_OTHER): Payer: Self-pay | Admitting: Internal Medicine

## 2017-09-07 ENCOUNTER — Ambulatory Visit (INDEPENDENT_AMBULATORY_CARE_PROVIDER_SITE_OTHER): Payer: Medicare HMO | Admitting: Internal Medicine

## 2017-09-07 VITALS — BP 116/70 | HR 68 | Temp 97.7°F | Resp 18 | Ht 65.0 in | Wt 184.4 lb

## 2017-09-07 DIAGNOSIS — K219 Gastro-esophageal reflux disease without esophagitis: Secondary | ICD-10-CM | POA: Diagnosis not present

## 2017-09-07 DIAGNOSIS — D500 Iron deficiency anemia secondary to blood loss (chronic): Secondary | ICD-10-CM | POA: Diagnosis not present

## 2017-09-07 NOTE — Progress Notes (Signed)
Presenting complaint;   Follow-up for GERD and history of iron deficiency anemia secondary GI bleed  From small bowel source.  Database and subjective:  Patient is 77 year old Caucasian male with multiple medical problems who was evaluated for iron deficiency anemia and GI bleed in 2014 and 2015 when he underwent EGD colonoscopy given capsule study and push enteroscopy.  He was felt to be bleeding from small bowel angiodysplasia.  He has refrain from using NSAIDs.  He was last seen in 1 year ago.  He has a copy of recent blood work with him. He has good appetite.  He has gained 12 pounds in the last 1 year.  He has heartburn no more than 2-3 times a week with certain foods.  He denies dysphagia nausea or vomiting.  He states he almost quit cigarette smoking.  He was down to smoking 1 to 2 cigarettes/day until he had an argument with his sister and now he is back up to 10 cigarettes/day.  He denies abdominal pain or rectal bleeding.  His stools are dark because he is on p.o. iron.  He says he will have low blood work when he sees nephrologist in near future.  He says He had left-sided chest pain one night after he had argument with his sister.  Pain was relieved with nitroglycerin.  Next day he woke up with sore left chest.  He has not had any more pain.   Patient wonders if he is still passing blood in his stool. He complains of knee pain and wonders if he can take Tylenol.  Current Medications: Outpatient Encounter Medications as of 09/07/2017  Medication Sig  . albuterol (PROAIR HFA) 108 (90 BASE) MCG/ACT inhaler Inhale 2 puffs into the lungs every 6 (six) hours as needed for wheezing or shortness of breath.  Marland Kitchen albuterol (PROVENTIL) (2.5 MG/3ML) 0.083% nebulizer solution Take 3 mLs (2.5 mg total) by nebulization every 2 (two) hours as needed for wheezing or shortness of breath.  . ALPRAZolam (XANAX) 0.5 MG tablet Take 0.5 mg by mouth 2 (two) times daily as needed for anxiety.   . carvedilol  (COREG) 12.5 MG tablet Take 12.5 mg by mouth 2 (two) times daily with a meal.   . cetirizine (ZYRTEC) 10 MG tablet Take 10 mg by mouth at bedtime.   . clopidogrel (PLAVIX) 75 MG tablet Take 1 tablet (75 mg total) by mouth every morning.  . escitalopram (LEXAPRO) 5 MG tablet Take 5 mg by mouth daily.   . ferrous sulfate 325 (65 FE) MG tablet Take 325 mg by mouth daily with breakfast.  . finasteride (PROSCAR) 5 MG tablet Take 5 mg by mouth daily.  . Fluticasone-Umeclidin-Vilant (TRELEGY ELLIPTA) 100-62.5-25 MCG/INH AEPB Inhale into the lungs. Patient states he uses 3 times a week  . furosemide (LASIX) 40 MG tablet Take 40 mg by mouth daily.  Marland Kitchen gabapentin (NEURONTIN) 100 MG capsule Take 100 mg by mouth at bedtime.   . isosorbide mononitrate (IMDUR) 60 MG 24 hr tablet Take 1 tablet (60 mg total) by mouth daily.  Marland Kitchen lisinopril (PRINIVIL,ZESTRIL) 2.5 MG tablet Take 2.5 mg by mouth daily.  Marland Kitchen NITROSTAT 0.4 MG SL tablet Place 0.4 mg under the tongue every 5 (five) minutes as needed for chest pain.   Marland Kitchen omeprazole (PRILOSEC) 20 MG capsule Take 20 mg by mouth daily.   . rosuvastatin (CRESTOR) 20 MG tablet Take 1 tablet (20 mg total) by mouth daily.  . [DISCONTINUED] feeding supplement, ENSURE ENLIVE, (ENSURE ENLIVE) LIQD  Take 237 mLs by mouth as needed (provide when pt asks for it (prefers chocolate)). (Patient not taking: Reported on 09/07/2017)   No facility-administered encounter medications on file as of 09/07/2017.      Objective: Blood pressure 116/70, pulse 68, temperature 97.7 F (36.5 C), temperature source Oral, resp. rate 18, height 5\' 5"  (1.651 m), weight 184 lb 6.4 oz (83.6 kg). Patient is alert and in no acute distress. Conjunctiva is pink. Sclera is nonicteric Oropharyngeal mucosa is normal. No neck masses or thyromegaly noted. Cardiac exam with regular rhythm normal S1 and S2. No murmur or gallop noted. Lungs are clear to auscultation. Abdomenis full but soft and nontender with  organomegaly or masses. He has slight clubbing and trace edema around ankles.  Labs/studies Results:  Lab data from 07/02/2017 WBC 5.4, H&H 11.4 and 35.1 and platelet count 170K. BUN 13, creatinine 1.63. Bilirubin 0.4, AP 109, AST 21, ALT 8.   Assessment:  #1.  History of iron deficiency anemia secondary to chronic blood loss from small bowel angiodysplasia in setting of antiplatelet agent and NSAID use which is not the case anymore.  His hemoglobin has leveled off.  He also may have an element of chronic disease anemia.  Will check stool with  to see if he is still losing blood  #2.  Chronic GERD.  He is doing well with therapy.   Plan:  Hemoccult x1. Patient can take Tylenol up to 2 g/day for knee pain as needed and divided dose. Patient should refrain from using OTC NSAIDs. Patient advised to call office if he has rectal bleeding or tarry stools. Office visit in 1 year.

## 2017-09-07 NOTE — Patient Instructions (Signed)
Hemoccult x1. Notify if you have rectal bleeding or tarry stools. Can take Tylenol up to 2 g/day for knee pain as needed.  Take it in divided dose such as 500 mg 4 times a day as needed or 1 g twice a day as needed.

## 2017-10-11 ENCOUNTER — Telehealth: Payer: Self-pay | Admitting: Cardiology

## 2017-10-11 NOTE — Telephone Encounter (Signed)
New Message    Patient is calling because he says that his arms and hands have been going numb.

## 2017-10-12 NOTE — Telephone Encounter (Signed)
Patient states he was having chest pain yesterday with left arm numbness. Patient states this lasted all day long. Patient states this morning he has been feeling fine. No chest pain or arm numbness. Advised patient that if this happened again he needs to go to the ER. Patient verbalized understanding.

## 2017-10-13 DIAGNOSIS — N200 Calculus of kidney: Secondary | ICD-10-CM | POA: Diagnosis not present

## 2017-10-13 DIAGNOSIS — R809 Proteinuria, unspecified: Secondary | ICD-10-CM | POA: Diagnosis not present

## 2017-10-13 DIAGNOSIS — N183 Chronic kidney disease, stage 3 (moderate): Secondary | ICD-10-CM | POA: Diagnosis not present

## 2017-10-13 DIAGNOSIS — M9080 Osteopathy in diseases classified elsewhere, unspecified site: Secondary | ICD-10-CM | POA: Diagnosis not present

## 2017-10-20 ENCOUNTER — Encounter (INDEPENDENT_AMBULATORY_CARE_PROVIDER_SITE_OTHER): Payer: Self-pay | Admitting: *Deleted

## 2017-12-21 DIAGNOSIS — H524 Presbyopia: Secondary | ICD-10-CM | POA: Diagnosis not present

## 2018-01-06 DIAGNOSIS — D509 Iron deficiency anemia, unspecified: Secondary | ICD-10-CM | POA: Diagnosis not present

## 2018-01-06 DIAGNOSIS — E1122 Type 2 diabetes mellitus with diabetic chronic kidney disease: Secondary | ICD-10-CM | POA: Diagnosis not present

## 2018-01-06 DIAGNOSIS — I10 Essential (primary) hypertension: Secondary | ICD-10-CM | POA: Diagnosis not present

## 2018-01-06 DIAGNOSIS — R5382 Chronic fatigue, unspecified: Secondary | ICD-10-CM | POA: Diagnosis not present

## 2018-01-06 DIAGNOSIS — M179 Osteoarthritis of knee, unspecified: Secondary | ICD-10-CM | POA: Diagnosis not present

## 2018-01-06 DIAGNOSIS — E782 Mixed hyperlipidemia: Secondary | ICD-10-CM | POA: Diagnosis not present

## 2018-01-06 DIAGNOSIS — Z6828 Body mass index (BMI) 28.0-28.9, adult: Secondary | ICD-10-CM | POA: Diagnosis not present

## 2018-01-06 DIAGNOSIS — J019 Acute sinusitis, unspecified: Secondary | ICD-10-CM | POA: Diagnosis not present

## 2018-01-06 DIAGNOSIS — R69 Illness, unspecified: Secondary | ICD-10-CM | POA: Diagnosis not present

## 2018-01-06 DIAGNOSIS — K5900 Constipation, unspecified: Secondary | ICD-10-CM | POA: Diagnosis not present

## 2018-01-10 DIAGNOSIS — J449 Chronic obstructive pulmonary disease, unspecified: Secondary | ICD-10-CM | POA: Diagnosis not present

## 2018-01-10 DIAGNOSIS — G9009 Other idiopathic peripheral autonomic neuropathy: Secondary | ICD-10-CM | POA: Diagnosis not present

## 2018-01-10 DIAGNOSIS — J309 Allergic rhinitis, unspecified: Secondary | ICD-10-CM | POA: Diagnosis not present

## 2018-01-10 DIAGNOSIS — I2510 Atherosclerotic heart disease of native coronary artery without angina pectoris: Secondary | ICD-10-CM | POA: Diagnosis not present

## 2018-01-10 DIAGNOSIS — E1122 Type 2 diabetes mellitus with diabetic chronic kidney disease: Secondary | ICD-10-CM | POA: Diagnosis not present

## 2018-01-10 DIAGNOSIS — I10 Essential (primary) hypertension: Secondary | ICD-10-CM | POA: Diagnosis not present

## 2018-01-10 DIAGNOSIS — N183 Chronic kidney disease, stage 3 (moderate): Secondary | ICD-10-CM | POA: Diagnosis not present

## 2018-01-10 DIAGNOSIS — E782 Mixed hyperlipidemia: Secondary | ICD-10-CM | POA: Diagnosis not present

## 2018-01-10 DIAGNOSIS — J019 Acute sinusitis, unspecified: Secondary | ICD-10-CM | POA: Diagnosis not present

## 2018-01-10 DIAGNOSIS — Z23 Encounter for immunization: Secondary | ICD-10-CM | POA: Diagnosis not present

## 2018-02-10 NOTE — Progress Notes (Signed)
Cardiology Office Note  Date: 02/11/2018   ID: Donald Gaines, DOB October 12, 1940, MRN [id number]  PCP: Celene Squibb, MD  Primary Cardiologist: Rozann Lesches, MD   Chief Complaint  Patient presents with  . Coronary Artery Disease    History of Present Illness: Donald Gaines is a 77 y.o. male last seen in May.  He is here for a routine follow-up visit.  He states that he has used a total of 2 nitroglycerin tablets in 6 months.  He remains fairly sedentary, limited by balance problems and gait, using a cane.  He is not anticoagulated for atrial fibrillation with prior history of GI bleeding and AVMs.He has been able to stay on Plavix however.  He does not report any changes in stools.  Current cardiac regimen includes Plavix, Coreg, Lasix, Imdur, Crestor and lisinopril.  He continues to follow with Dr. [place] Crane.  I reviewed his most recent lab work, LDL was 59.  We discussed smoking cessation today.  He states that he is tried to quit almost 15-20 times over the last several years.  He has not been able to quit for longer than 3 months at a time.  He is not interested in pharmacologic measures, states that he is not prepared to make another quit attempt at this point.  Past Medical History:  Diagnosis Date  . Arteriovenous malformation small bowel    Capsule study 3/10  . Bilateral carotid artery stenosis   . BPH (benign prostatic hypertrophy)   . Candida esophagitis ([place])   . Cataract   . Chronic anemia   . Chronic diarrhea   . COPD (chronic obstructive pulmonary disease) ([place])   . Coronary atherosclerosis of native coronary artery    Multivessel, BMS SVG TO RCA 2000, reportedly  "2" additional stents in interim  . Dementia ([place])   . Depression   . Diabetes mellitus, type 2 ([place])   . Essential hypertension   . GERD (gastroesophageal reflux disease)   . GI bleeding    Cecal ulcers, arteriovenous malformations  . Glaucoma   . Hemorrhoids   . Hyperlipidemia   .  Myocardial infarction ([place])    1994  . Portal hypertensive gastropathy ([place]) 11/08/2012   EGD. Dr. Laural Golden  . Restrictive lung disease   . Sleep apnea   . TIA (transient ischemic attack)    2005    Past Surgical History:  Procedure Laterality Date  . CAPSULE ENDO  03/10  . cardiac catherization  04/2012  . CARDIAC CATHETERIZATION  04/05/1996 [name]   EF 45%, occluded SVG to circumflex, patent SVG to D1 and PDA and patent LIMA to LAD with severe native vessel disease.  Marland Kitchen CARDIAC CATHETERIZATION  08/22/1998 [name]   Mild LM, severe LAD,severe CX, occlude RCA, patient  SVG to diatal RCA, patent vein graft to D1 and patent LIMA to LAD.   Marland Kitchen CATARACT EXTRACTION W/PHACO Right 09/22/2012   Procedure: CATARACT EXTRACTION PHACO AND INTRAOCULAR LENS PLACEMENT (IOC);  Surgeon: Tonny Branch, MD;  Location: [facility];  Service: Ophthalmology;  Laterality: Right;  CDE: 11.43  . CATARACT EXTRACTION W/PHACO Left 10/17/2012   Procedure: CATARACT EXTRACTION PHACO AND INTRAOCULAR LENS PLACEMENT (IOC);  Surgeon: Tonny Branch, MD;  Location: [facility];  Service: Ophthalmology;  Laterality: Left;  CDE: 8.06  . COLONOSCOPY  APR 2008   SIMPLE ADENOMA, [place] TICS, IH  . COLONOSCOPY  FEB 2008 ANEMIA. [facility]LE[facility]    2 LARGE ILEOCEAL ULCERS 2o to ASA, [place] TICS,  IH  . COLONOSCOPY  SEP 2009 TRANSFUSION DEP ANEMIA   AC AVM-ABLATED, [place] TICS, IH  . COLONOSCOPY WITH ESOPHAGOGASTRODUODENOSCOPY (EGD) N/A 11/08/2012   Procedure: COLONOSCOPY WITH ESOPHAGOGASTRODUODENOSCOPY (EGD);  Surgeon: Rogene Houston, MD;  [facility];  Service: Endoscopy;  Laterality: N/A;  250-rescheduled to 7:30 Ann to notify pt  . CORONARY ANGIOPLASTY WITH STENT PLACEMENT  08/22/1998 [name]   TEC stenting of the SVG to RCA-Last seen by cardiologist in 2010.  Marland Kitchen CORONARY ARTERY BYPASS GRAFT  1995-triple bypass   LIMA-LAD, LIMA to intermediate branch, SVG to PD and second OM,  . ESOPHAGOGASTRODUODENOSCOPY  SEP 09   DUODENAL LIPOMA  .  ESOPHAGOGASTRODUODENOSCOPY N/A 09/27/2013   Procedure: ESOPHAGOGASTRODUODENOSCOPY (EGD) Push enteroscopy;  Surgeon: Rogene Houston, MD;  [facility];  Service: Endoscopy;  Laterality: N/A;  . GIVENS CAPSULE STUDY N/A 11/24/2012   Procedure: GIVENS CAPSULE STUDY;  Surgeon: Rogene Houston, MD;  [facility];  Service: Endoscopy;  Laterality: N/A;  730  . HOT HEMOSTASIS  09/27/2013   Procedure: HOT HEMOSTASIS (ARGON PLASMA COAGULATION/BICAP);  Surgeon: Rogene Houston, MD;  [facility]: [facility];  Service: Endoscopy;;  . HYDROGEN BREATH TEST  2009  . RIGHT INGUINAL HERNIA REPAIR    . UPPER GASTROINTESTINAL ENDOSCOPY  SEP 2009   GASTRIC AVM ABLATED, NL DUO Bx    Current Outpatient Medications  Medication Sig Dispense Refill  . albuterol (PROAIR HFA) 108 (90 BASE) MCG/ACT inhaler Inhale 2 puffs into the lungs every 6 (six) hours as needed for wheezing or shortness of breath.    Marland Kitchen albuterol (PROVENTIL) (2.5 MG/3ML) 0.083% nebulizer solution Take 3 mLs (2.5 mg total) by nebulization every 2 (two) hours as needed for wheezing or shortness of breath. 75 mL 12  . ALPRAZolam (XANAX) 0.5 MG tablet Take 0.5 mg by mouth 2 (two) times daily as needed for anxiety.     . carvedilol (COREG) 12.5 MG tablet Take 12.5 mg by mouth 2 (two) times daily with a meal.     . cetirizine (ZYRTEC) 10 MG tablet Take 10 mg by mouth at bedtime.     . clopidogrel (PLAVIX) 75 MG tablet Take 1 tablet (75 mg total) by mouth every morning.    . escitalopram (LEXAPRO) 5 MG tablet Take 5 mg by mouth daily.     . ferrous sulfate 325 (65 FE) MG tablet Take 325 mg by mouth daily with breakfast.    . finasteride (PROSCAR) 5 MG tablet Take 5 mg by mouth daily.    . Fluticasone-Umeclidin-Vilant (TRELEGY ELLIPTA) 100-62.5-25 MCG/INH AEPB Inhale into the lungs every morning.     . furosemide (LASIX) 40 MG tablet Take 40 mg by mouth daily.    Marland Kitchen gabapentin (NEURONTIN) 100 MG capsule Take 100 mg by mouth at bedtime.       . isosorbide mononitrate (IMDUR) 60 MG 24 hr tablet Take 1 tablet (60 mg total) by mouth daily. 30 tablet 11  . lisinopril (PRINIVIL,ZESTRIL) 2.5 MG tablet Take 2.5 mg by mouth daily.    Marland Kitchen NITROSTAT 0.4 MG SL tablet Place 0.4 mg under the tongue every 5 (five) minutes as needed for chest pain.     Marland Kitchen omeprazole (PRILOSEC) 20 MG capsule Take 20 mg by mouth daily.     . rosuvastatin (CRESTOR) 20 MG tablet Take 1 tablet (20 mg total) by mouth daily. 90 tablet 0   No current facility-administered medications for this visit.    Allergies:  Penicillins   Social History: The patient  reports that he has been smoking cigarettes. He has a 60.00 pack-year smoking history. He has never used smokeless tobacco. He reports that he has current or past drug history. Drug: Marijuana. He reports that he does not drink alcohol.   ROS:  Please see the history of present illness. Otherwise, complete review of systems is positive for hearing loss.  All other systems are reviewed and negative.   Physical Exam: VS:  BP 115/64   Pulse 64   Ht 5\' 5"  (1.651 m)   Wt 187 lb 12.8 oz (85.2 kg)   SpO2 94%   BMI 31.25 kg/m , BMI Body mass index is 31.25 kg/m.  Wt Readings from Last 3 Encounters:  02/11/18 187 lb 12.8 oz (85.2 kg)  09/07/17 184 lb 6.4 oz (83.6 kg)  08/09/17 184 lb (83.5 kg)    General: Chronically ill-appearing male, appears comfortable at rest, using a cane. HEENT: Conjunctiva and lids normal, oropharynx clear with poor dentition. Neck: Supple, no elevated JVP or carotid bruits, no thyromegaly. Lungs: Clear to auscultation, nonlabored breathing at rest. Cardiac: Irregularly irregular, no S3, soft systolic murmur. Abdomen: Soft, nontender, bowel sounds present. Extremities: Trace ankle edema, distal pulses 1+. Skin: Warm and dry. Musculoskeletal: No kyphosis. Neuropsychiatric: Alert and oriented x3, affect grossly appropriate.  ECG: I personally reviewed the tracing from 08/09/2017 which  showed rate controlled atrial fibrillation with incomplete right bundle branch block, diffuse nonspecific ST-T changes.  Recent Labwork:  October 2019: Hemoglobin 11.1, platelets 164, BUN 14, creatinine 1.69, potassium 4.1, AST 15, ALT 7, cholesterol 120, triglycerides 91, HDL 43, LDL 59, hemoglobin A1c 6.2%  Other Studies Reviewed Today:  Lexiscan Myoview 11/23/2013: IMPRESSION: 1. Large inferolateral wall scar with no peri-infarct ischemia.  2. Decreased left ventricular systolic function with inferolateral wall hypokinesis.  3. Left ventricular ejection fraction 39%  4. High-risk stress test findings based on low ejection fraction, recommend correlate findings with echocardiogram. There is no current myocardium at jeopardy*.  Echocardiogram 02/10/2017: Study Conclusions  - Left ventricle: The cavity size was normal. Wall thickness was increased in a pattern of moderate LVH. Systolic function was vigorous. The estimated ejection fraction was in the range of 65% to 70%. Although no diagnostic regional wall motion abnormality was identified, this possibility cannot be completely excluded on the basis of this study. The study is not technically sufficient to allow evaluation of LV diastolic function. - Aortic valve: Mildly calcified annulus. Trileaflet; mildly calcified leaflets. - Mitral valve: Mildly thickened leaflets . There was mild regurgitation. - Left atrium: The atrium was moderately dilated. - Atrial septum: No defect or patent foramen ovale was identified. - Tricuspid valve: There was mild regurgitation. Peak RV-RA gradient (S): 30 mm Hg. - Inferior vena cava: Not well visualized. - Pericardium, extracardiac: There was no pericardial effusion.  Impressions:  - Moderate LVH with LVEF 65-70%. Indeterminate diastolic function. Moderate left atrial enlargement. Mildly thickened mitral leaflets with mild mitral regurgitation. Mildly  sclerotic aortic valve. Mild tricuspid regurgitation with RV-RA gradient 30 mmHg.  Assessment and Plan:  1.  CAD with history of CABG and PCI as outlined above.  We are continuing conservative follow-up and medical therapy without repeat ischemic testing in the absence of progressive angina.  Last assessment was in 2015.  At that point he had evidence of large inferolateral scar but no substantial ischemia.  Continue Plavix and statin.  2.  Permanent atrial fibrillation.  He is not on anticoagulation  with previous history of GI bleeding and AVMs.  He is on Coreg for heart rate control.  3.  Longstanding tobacco abuse and COPD. The patient was counseled on tobacco cessation today for 5 minutes.  Counseling included reviewing the risks of smoking tobacco products, how it impacts the patient's current medical diagnoses and different strategies for quitting.  Pharmacotherapy to aid in tobacco cessation was not prescribed today.  4.  Mixed hyperlipidemia on Crestor.  Recent LDL 59.  Current medicines were reviewed with the patient today.  Disposition: Follow-up in 6 months.  Signed, Satira Sark, MD, [facility] 02/11/2018 10:26 AM    [facility] [place], [place], [place] [address] Phone: [phone]; Fax: [phone]

## 2018-02-11 ENCOUNTER — Ambulatory Visit (INDEPENDENT_AMBULATORY_CARE_PROVIDER_SITE_OTHER): Payer: Medicare HMO | Admitting: Cardiology

## 2018-02-11 ENCOUNTER — Encounter: Payer: Self-pay | Admitting: Cardiology

## 2018-02-11 VITALS — BP 115/64 | HR 64 | Ht 65.0 in | Wt 187.8 lb

## 2018-02-11 DIAGNOSIS — I4821 Permanent atrial fibrillation: Secondary | ICD-10-CM

## 2018-02-11 DIAGNOSIS — E782 Mixed hyperlipidemia: Secondary | ICD-10-CM | POA: Diagnosis not present

## 2018-02-11 DIAGNOSIS — I25119 Atherosclerotic heart disease of native coronary artery with unspecified angina pectoris: Secondary | ICD-10-CM

## 2018-02-11 DIAGNOSIS — F17200 Nicotine dependence, unspecified, uncomplicated: Secondary | ICD-10-CM

## 2018-02-11 DIAGNOSIS — R69 Illness, unspecified: Secondary | ICD-10-CM | POA: Diagnosis not present

## 2018-02-11 NOTE — Patient Instructions (Addendum)

## 2018-07-11 ENCOUNTER — Telehealth: Payer: Self-pay | Admitting: Cardiology

## 2018-07-11 DIAGNOSIS — Z0000 Encounter for general adult medical examination without abnormal findings: Secondary | ICD-10-CM | POA: Diagnosis not present

## 2018-07-11 NOTE — Telephone Encounter (Signed)
Pt says on Saturday was walking around his home when he slipped, fell and hit his chest and feet on a table. Michela Pitcher he has felt pinching in his chest, c/o being sore and bruising on chest and feet (soreness is better today). He was wearing slippery shoes at the time and thinks this is why he fell. Denies feeling faint or passing out/dizziness. Denies any other symptoms prior or since the fall. Was concerned about taking medications for soreness with his heart medications. Explained that he could safely take Tylenol for a few days to see if this helps and update [place] if new/worsening symptoms occur and that I would forward to Dr Domenic Polite. Pt voiced understanding

## 2018-07-11 NOTE — Telephone Encounter (Signed)
Patient called stating that on Saturday 07-09-2018 he fell hitting his chest area. States that he is very sore with a pinching sensation around his heart area. Patient is concerned about his heart.

## 2018-07-15 DIAGNOSIS — E1142 Type 2 diabetes mellitus with diabetic polyneuropathy: Secondary | ICD-10-CM | POA: Diagnosis not present

## 2018-07-15 DIAGNOSIS — E1122 Type 2 diabetes mellitus with diabetic chronic kidney disease: Secondary | ICD-10-CM | POA: Diagnosis not present

## 2018-07-15 DIAGNOSIS — I10 Essential (primary) hypertension: Secondary | ICD-10-CM | POA: Diagnosis not present

## 2018-07-15 DIAGNOSIS — N183 Chronic kidney disease, stage 3 (moderate): Secondary | ICD-10-CM | POA: Diagnosis not present

## 2018-07-15 DIAGNOSIS — D509 Iron deficiency anemia, unspecified: Secondary | ICD-10-CM | POA: Diagnosis not present

## 2018-07-15 DIAGNOSIS — E782 Mixed hyperlipidemia: Secondary | ICD-10-CM | POA: Diagnosis not present

## 2018-07-20 DIAGNOSIS — Z720 Tobacco use: Secondary | ICD-10-CM | POA: Diagnosis not present

## 2018-07-20 DIAGNOSIS — E1122 Type 2 diabetes mellitus with diabetic chronic kidney disease: Secondary | ICD-10-CM | POA: Diagnosis not present

## 2018-07-20 DIAGNOSIS — E782 Mixed hyperlipidemia: Secondary | ICD-10-CM | POA: Diagnosis not present

## 2018-07-20 DIAGNOSIS — I2510 Atherosclerotic heart disease of native coronary artery without angina pectoris: Secondary | ICD-10-CM | POA: Diagnosis not present

## 2018-07-20 DIAGNOSIS — I10 Essential (primary) hypertension: Secondary | ICD-10-CM | POA: Diagnosis not present

## 2018-07-20 DIAGNOSIS — E1140 Type 2 diabetes mellitus with diabetic neuropathy, unspecified: Secondary | ICD-10-CM | POA: Diagnosis not present

## 2018-07-20 DIAGNOSIS — N183 Chronic kidney disease, stage 3 (moderate): Secondary | ICD-10-CM | POA: Diagnosis not present

## 2018-07-20 DIAGNOSIS — D631 Anemia in chronic kidney disease: Secondary | ICD-10-CM | POA: Diagnosis not present

## 2018-07-20 DIAGNOSIS — J449 Chronic obstructive pulmonary disease, unspecified: Secondary | ICD-10-CM | POA: Diagnosis not present

## 2018-07-20 DIAGNOSIS — J310 Chronic rhinitis: Secondary | ICD-10-CM | POA: Diagnosis not present

## 2018-08-09 ENCOUNTER — Telehealth: Payer: Self-pay | Admitting: Cardiology

## 2018-08-09 NOTE — Telephone Encounter (Signed)
Virtual Visit Pre-Appointment Phone Call  "(Name), I am calling you today to discuss your upcoming appointment. We are currently trying to limit exposure to the virus that causes COVID-19 by seeing patients at home rather than in the office."  1. "What is the BEST phone number to call the day of the visit?" - include this in appointment notes  2. Do you have or have access to (through a family member/friend) a smartphone with video capability that we can use for your visit?" a. If yes - list this number in appt notes as cell (if different from BEST phone #) and list the appointment type as a VIDEO visit in appointment notes b. If no - list the appointment type as a PHONE visit in appointment notes  3. Confirm consent - "In the setting of the current Covid19 crisis, you are scheduled for a (phone or video) visit with your provider on (date) at (time).  Just as we do with many in-office visits, in order for you to participate in this visit, we must obtain consent.  If you'd like, I can send this to your mychart (if signed up) or email for you to review.  Otherwise, I can obtain your verbal consent now.  All virtual visits are billed to your insurance company just like a normal visit would be.  By agreeing to a virtual visit, we'd like you to understand that the technology does not allow for your provider to perform an examination, and thus may limit your provider's ability to fully assess your condition. If your provider identifies any concerns that need to be evaluated in person, we will make arrangements to do so.  Finally, though the technology is pretty good, we cannot assure that it will always work on either your or our end, and in the setting of a video visit, we may have to convert it to a phone-only visit.  In either situation, we cannot ensure that we have a secure connection.  Are you willing to proceed?" STAFF: Did the patient verbally acknowledge consent to telehealth visit? Document  YES/NO here: yes  4. Advise patient to be prepared - "Two hours prior to your appointment, go ahead and check your blood pressure, pulse, oxygen saturation, and your weight (if you have the equipment to check those) and write them all down. When your visit starts, your provider will ask you for this information. If you have an Apple Watch or Kardia device, please plan to have heart rate information ready on the day of your appointment. Please have a pen and paper handy nearby the day of the visit as well."  5. Give patient instructions for MyChart download to smartphone OR [email or web address] as below if video visit (depending on what platform provider is using)  6. Inform patient they will receive a phone call 15 minutes prior to their appointment time (may be from unknown caller ID) so they should be prepared to answer    TELEPHONE CALL NOTE  Donald Gaines has been deemed a candidate for a follow-up tele-health visit to limit community exposure during the Covid-19 pandemic. I spoke with the patient via phone to ensure availability of phone/video source, confirm preferred email & phone number, and discuss instructions and expectations.  I reminded Donald Gaines to be prepared with any vital sign and/or heart rhythm information that could potentially be obtained via home monitoring, at the time of his visit. I reminded Donald Gaines to expect a phone call prior to  his visit.  Donald Gaines 08/09/2018 3:21 PM   INSTRUCTIONS FOR DOWNLOADING THE MYCHART APP TO SMARTPHONE  - The patient must first make sure to have activated MyChart and know their login information - If Apple, go to [employer] and type in MyChart in the search bar and download the app. If Android, ask patient to go to [employer] and type in [place] in the search bar and download the app. The app is free but as with any other app downloads, their phone may require them to verify saved payment information or  Apple/Android password.  - The patient will need to then log into the app with their MyChart username and password, and select [facility] as their healthcare provider to link the account. When it is time for your visit, go to the MyChart app, find appointments, and click Begin Video Visit. Be sure to Select Allow for your device to access the Microphone and Camera for your visit. You will then be connected, and your provider will be with you shortly.  **If they have any issues connecting, or need assistance please contact MyChart service desk [phone])**  **If using a computer, in order to ensure the best quality for their visit they will need to use either of the following Internet Browsers: [employer], or Google Chrome**  IF USING DOXIMITY or DOXY.ME - The patient will receive a link just prior to their visit by text.     FULL LENGTH CONSENT FOR TELE-HEALTH VISIT   I hereby voluntarily request, consent and authorize [facility] and its employed or contracted physicians, physician assistants, nurse practitioners or other licensed health care professionals (the Practitioner), to provide me with telemedicine health care services (the Services") as deemed necessary by the treating Practitioner. I acknowledge and consent to receive the Services by the Practitioner via telemedicine. I understand that the telemedicine visit will involve communicating with the Practitioner through live audiovisual communication technology and the disclosure of certain medical information by electronic transmission. I acknowledge that I have been given the opportunity to request an in-person assessment or other available alternative prior to the telemedicine visit and am voluntarily participating in the telemedicine visit.  I understand that I have the right to withhold or withdraw my consent to the use of telemedicine in the course of my care at any time, without affecting my right to future care  or treatment, and that the Practitioner or I may terminate the telemedicine visit at any time. I understand that I have the right to inspect all information obtained and/or recorded in the course of the telemedicine visit and may receive copies of available information for a reasonable fee.  I understand that some of the potential risks of receiving the Services via telemedicine include:   Delay or interruption in medical evaluation due to technological equipment failure or disruption;  Information transmitted may not be sufficient (e.g. poor resolution of images) to allow for appropriate medical decision making by the Practitioner; and/or   In rare instances, security protocols could fail, causing a breach of personal health information.  Furthermore, I acknowledge that it is my responsibility to provide information about my medical history, conditions and care that is complete and accurate to the best of my ability. I acknowledge that Practitioner's advice, recommendations, and/or decision may be based on factors not within their control, such as incomplete or inaccurate data provided by me or distortions of diagnostic images or specimens that may result from electronic transmissions. I  understand that the practice of medicine is not an exact science and that Practitioner makes no warranties or guarantees regarding treatment outcomes. I acknowledge that I will receive a copy of this consent concurrently upon execution via email to the email address I last provided but may also request a printed copy by calling the office of [facility].    I understand that my insurance will be billed for this visit.   I have read or had this consent read to me.  I understand the contents of this consent, which adequately explains the benefits and risks of the Services being provided via telemedicine.   I have been provided ample opportunity to ask questions regarding this consent and the Services and have had  my questions answered to my satisfaction.  I give my informed consent for the services to be provided through the use of telemedicine in my medical care  By participating in this telemedicine visit I agree to the above.

## 2018-08-10 NOTE — Progress Notes (Signed)
Virtual Visit via Telephone Note   This visit type was conducted due to national recommendations for restrictions regarding the COVID-19 Pandemic (e.g. social distancing) in an effort to limit this patient's exposure and mitigate transmission in our community.  Due to his co-morbid illnesses, this patient is at least at moderate risk for complications without adequate follow up.  This format is felt to be most appropriate for this patient at this time.  The patient did not have access to video technology/had technical difficulties with video requiring transitioning to audio format only (telephone).  All issues noted in this document were discussed and addressed.  No physical exam could be performed with this format.  Please refer to the patient's chart for his  consent to telehealth for [facility].   Date:  08/12/2018   ID:  Donald Gaines, DOB December 01, 1940, MRN [id number]  Patient Location: Home Provider Location: Office  PCP:  Celene Squibb, MD  Cardiologist:  Rozann Lesches, MD  Evaluation Performed:  Follow-Up Visit  Chief Complaint:   Cardiac follow-up  History of Present Illness:    Donald Gaines is a 78 y.o. male last seen in November 2019.  He did not have video access and we communicated by phone today.  He states that he is only used 2 nitroglycerin tablets in the last 6 months.  He does not report any regular exertional chest pain or increasing shortness of breath with typical activities.  He is not anticoagulated for atrial fibrillation with prior history of GI bleeding and AVMs.  He has had no focal neurological symptoms in the interim, no description of palpitations.  I reviewed his recent lab work from Dr. Nevada Crane as outlined below.  LDL was well controlled on Crestor.  He reports no intolerances.  We went over his remaining medications.  The patient does not have symptoms concerning for COVID-19 infection (fever, chills, cough, or new shortness of breath).    Past  Medical History:  Diagnosis Date  . Arteriovenous malformation small bowel    Capsule study 3/10  . Bilateral carotid artery stenosis   . BPH (benign prostatic hypertrophy)   . Candida esophagitis ([place])   . Cataract   . Chronic anemia   . Chronic diarrhea   . COPD (chronic obstructive pulmonary disease) ([place])   . Coronary atherosclerosis of native coronary artery    Multivessel, BMS SVG TO RCA 2000, reportedly  "2" additional stents in interim  . Dementia ([place])   . Depression   . Diabetes mellitus, type 2 ([place])   . Essential hypertension   . GERD (gastroesophageal reflux disease)   . GI bleeding    Cecal ulcers, arteriovenous malformations  . Glaucoma   . Hemorrhoids   . Hyperlipidemia   . Myocardial infarction ([place])    1994  . Portal hypertensive gastropathy ([place]) 11/08/2012   EGD. Dr. Laural Golden  . Restrictive lung disease   . Sleep apnea   . TIA (transient ischemic attack)    2005   Past Surgical History:  Procedure Laterality Date  . CAPSULE ENDO  03/10  . cardiac catherization  04/2012  . CARDIAC CATHETERIZATION  04/05/1996 [name]   EF 45%, occluded SVG to circumflex, patent SVG to D1 and PDA and patent LIMA to LAD with severe native vessel disease.  Marland Kitchen CARDIAC CATHETERIZATION  08/22/1998 [name]   Mild LM, severe LAD,severe CX, occlude RCA, patient  SVG to diatal RCA, patent vein graft to D1 and patent LIMA to  LAD.   Marland Kitchen CATARACT EXTRACTION W/PHACO Right 09/22/2012   Procedure: CATARACT EXTRACTION PHACO AND INTRAOCULAR LENS PLACEMENT (IOC);  Surgeon: Tonny Branch, MD;  Location: [facility] OR[facility];  Service: Ophthalmology;  Laterality: Right;  CDE: 11.43  . CATARACT EXTRACTION W/PHACO Left 10/17/2012   Procedure: CATARACT EXTRACTION PHACO AND INTRAOCULAR LENS PLACEMENT (IOC);  Surgeon: Tonny Branch, MD;  Location: [facility] ORS;  Service: Ophthalmology;  Laterality: Left;  CDE: 8.06  . COLONOSCOPY  APR 2008   SIMPLE ADENOMA, [place] TICS, IH  . COLONOSCOPY  FEB 2008 ANEMIA. MELENA    2 LARGE  ILEOCEAL ULCERS 2o to ASA, [place] TICS, IH  . COLONOSCOPY  SEP 2009 TRANSFUSION DEP ANEMIA   AC AVM-ABLATED,  TICS, IH  . COLONOSCOPY WITH ESOPHAGOGASTRODUODENOSCOPY (EGD) N/A 11/08/2012   Procedure: COLONOSCOPY WITH ESOPHAGOGASTRODUODENOSCOPY (EGD);  Surgeon: Rogene Houston, MD;  Location: [facility] SU[facility];  Service: Endoscopy;  Laterality: N/A;  250-rescheduled to 7:30 Ann to notify pt  . CORONARY ANGIOPLASTY WITH STENT PLACEMENT  08/22/1998 [name]   TEC stenting of the SVG to RCA-Last seen by cardiologist in 2010.  Marland Kitchen CORONARY ARTERY BYPASS GRAFT  1995-triple bypass   LIMA-LAD, LIMA to intermediate branch, SVG to PD and second OM,  . ESOPHAGOGASTRODUODENOSCOPY  SEP 09   DUODENAL LIPOMA  . ESOPHAGOGASTRODUODENOSCOPY N/A 09/27/2013   Procedure: ESOPHAGOGASTRODUODENOSCOPY (EGD) Push enteroscopy;  Surgeon: Rogene Houston, MD;  Location: [facility];  Service: Endoscopy;  Laterality: N/A;  . GIVENS CAPSULE STUDY N/A 11/24/2012   Procedure: GIVENS CAPSULE STUDY;  Surgeon: Rogene Houston, MD;  Location: [facility];  Service: Endoscopy;  Laterality: N/A;  730  . HOT HEMOSTASIS  09/27/2013   Procedure: HOT HEMOSTASIS (ARGON PLASMA COAGULATION/BICAP);  Surgeon: Rogene Houston, MD;  Location: [facility];  Service: Endoscopy;;  . HYDROGEN BREATH TEST  2009  . RIGHT INGUINAL HERNIA REPAIR    . UPPER GASTROINTESTINAL ENDOSCOPY  SEP 2009   GASTRIC AVM ABLATED, NL DUO Bx     Current Meds  Medication Sig  . albuterol (PROAIR HFA) 108 (90 BASE) MCG/ACT inhaler Inhale 2 puffs into the lungs every 6 (six) hours as needed for wheezing or shortness of breath.  Marland Kitchen albuterol (PROVENTIL) (2.5 MG/3ML) 0.083% nebulizer solution Take 3 mLs (2.5 mg total) by nebulization every 2 (two) hours as needed for wheezing or shortness of breath.  . ALPRAZolam (XANAX) 0.5 MG tablet Take 0.5 mg by mouth 2 (two) times daily as needed for anxiety.   . carvedilol (COREG) 12.5 MG tablet Take 12.5 mg by mouth 2 (two) times  daily with a meal.   . cetirizine (ZYRTEC) 10 MG tablet Take 10 mg by mouth at bedtime.   . clopidogrel (PLAVIX) 75 MG tablet Take 1 tablet (75 mg total) by mouth every morning.  . escitalopram (LEXAPRO) 5 MG tablet Take 5 mg by mouth daily.   . ferrous sulfate 325 (65 FE) MG tablet Take 325 mg by mouth daily with breakfast.  . finasteride (PROSCAR) 5 MG tablet Take 5 mg by mouth daily.  . furosemide (LASIX) 40 MG tablet Take 40 mg by mouth daily.  Marland Kitchen gabapentin (NEURONTIN) 100 MG capsule Take 100 mg by mouth at bedtime.   . isosorbide mononitrate (IMDUR) 60 MG 24 hr tablet Take 1 tablet (60 mg total) by mouth daily.  Marland Kitchen lisinopril (PRINIVIL,ZESTRIL) 2.5 MG tablet Take 2.5 mg by mouth daily.  Marland Kitchen NITROSTAT 0.4 MG SL tablet Place 0.4 mg under the tongue every  5 (five) minutes as needed for chest pain.   Marland Kitchen omeprazole (PRILOSEC) 20 MG capsule Take 20 mg by mouth daily.   . rosuvastatin (CRESTOR) 20 MG tablet Take 1 tablet (20 mg total) by mouth daily.  Marland Kitchen umeclidinium-vilanterol (ANORO ELLIPTA) 62.5-25 MCG/INH AEPB Inhale 1 puff into the lungs daily.     Allergies:   Penicillins   Social History   Tobacco Use  . Smoking status: Current Every Day Smoker    Packs/day: 1.00    Years: 60.00    Pack years: 60.00    Types: Cigarettes  . Smokeless tobacco: Never Used  . Tobacco comment: 1 pack a day  Substance Use Topics  . Alcohol use: No    Alcohol/week: 0.0 standard drinks    Comment: No etoh in 12 yrs.  . Drug use: Yes    Types: Marijuana     Family Hx: The patient's family history includes Heart attack in his mother; Heart disease in his mother; Hypertension in his mother.  ROS:   Please see the history of present illness.    All other systems reviewed and are negative.   Prior CV studies:   The following studies were reviewed today:  Lexiscan Myoview 11/23/2013: IMPRESSION: 1. Large inferolateral wall scar with no peri-infarct ischemia.  2. Decreased left ventricular  systolic function with inferolateral wall hypokinesis.  3. Left ventricular ejection fraction 39%  4. High-risk stress test findings based on low ejection fraction, recommend correlate findings with echocardiogram. There is no current myocardium at jeopardy*.  Echocardiogram 02/10/2017: Study Conclusions  - Left ventricle: The cavity size was normal. Wall thickness was increased in a pattern of moderate LVH. Systolic function was vigorous. The estimated ejection fraction was in the range of 65% to 70%. Although no diagnostic regional wall motion abnormality was identified, this possibility cannot be completely excluded on the basis of this study. The study is not technically sufficient to allow evaluation of LV diastolic function. - Aortic valve: Mildly calcified annulus. Trileaflet; mildly calcified leaflets. - Mitral valve: Mildly thickened leaflets . There was mild regurgitation. - Left atrium: The atrium was moderately dilated. - Atrial septum: No defect or patent foramen ovale was identified. - Tricuspid valve: There was mild regurgitation. Peak RV-RA gradient (S): 30 mm Hg. - Inferior vena cava: Not well visualized. - Pericardium, extracardiac: There was no pericardial effusion.  Impressions:  - Moderate LVH with LVEF 65-70%. Indeterminate diastolic function. Moderate left atrial enlargement. Mildly thickened mitral leaflets with mild mitral regurgitation. Mildly sclerotic aortic valve. Mild tricuspid regurgitation with RV-RA gradient 30 mmHg.  Labs/Other Tests and Data Reviewed:    EKG:  An ECG dated 08/09/2017 was personally reviewed today and demonstrated:  Rate controlled atrial fibrillation with incomplete right bundle branch block, diffuse nonspecific ST-T changes.  Recent Labs:  April 2020: Hemoglobin 10.9, platelets 155, BUN 18, creatinine 1.94, potassium 4.4, AST 14, ALT 7, cholesterol 115, triglycerides 83, HDL 41, LDL 57,  hemoglobin A1c 6.1%  Wt Readings from Last 3 Encounters:  02/11/18 187 lb 12.8 oz (85.2 kg)  09/07/17 184 lb 6.4 oz (83.6 kg)  08/09/17 184 lb (83.5 kg)     Objective:    Vital Signs:  Ht 5\' 5"  (1.651 m)   BMI 31.25 kg/m    Patient did not have a way to check his vital signs today.  He recalls being told that his blood pressure "looked good" when he saw Dr. Nevada Crane. He spoke in full sentences on the phone, not  short of breath. No audible wheezing. Speech pattern normal.  ASSESSMENT & PLAN:    1.  CAD status post CABG and PCI as documented above.  He continues with medical therapy and observation in the absence of progressive angina symptoms.  Last ischemic assessment in 2015 revealed large inferolateral scar without substantial ischemia.  Continue antiplatelet regimen and statin, states that he has a fresh bottle of nitroglycerin.  2.  Longstanding history of tobacco abuse and COPD.  We have discussed smoking cessation over time, he has not been able to quit.  3.  Mixed hyperlipidemia on Crestor.  Recent LDL 57.  He reports no intolerances.  4.  Permanent atrial fibrillation.  He reports no palpitations.  Heart rate has been adequately controlled on Coreg.  He is not anticoagulated with prior history of GI bleeding and AVMs.  COVID-19 Education: The signs and symptoms of COVID-19 were discussed with the patient and how to seek care for testing (follow up with PCP or arrange E-visit).  The importance of social distancing was discussed today.  Time:   Today, I have spent 7 minutes with the patient with telehealth technology discussing the above problems.     Medication Adjustments/Labs and Tests Ordered: Current medicines are reviewed at length with the patient today.  Concerns regarding medicines are outlined above.   Tests Ordered: No orders of the defined types were placed in this encounter.   Medication Changes: No orders of the defined types were placed in this encounter.    Disposition:  Follow up 6 months in the [place] office.  Signed, Rozann Lesches, MD  08/12/2018 10:15 AM    [place]

## 2018-08-12 ENCOUNTER — Telehealth (INDEPENDENT_AMBULATORY_CARE_PROVIDER_SITE_OTHER): Payer: Medicare HMO | Admitting: Cardiology

## 2018-08-12 VITALS — Ht 65.0 in

## 2018-08-12 DIAGNOSIS — I4821 Permanent atrial fibrillation: Secondary | ICD-10-CM | POA: Diagnosis not present

## 2018-08-12 DIAGNOSIS — Z7189 Other specified counseling: Secondary | ICD-10-CM | POA: Diagnosis not present

## 2018-08-12 DIAGNOSIS — I25119 Atherosclerotic heart disease of native coronary artery with unspecified angina pectoris: Secondary | ICD-10-CM | POA: Diagnosis not present

## 2018-08-12 DIAGNOSIS — F17200 Nicotine dependence, unspecified, uncomplicated: Secondary | ICD-10-CM

## 2018-08-12 DIAGNOSIS — R69 Illness, unspecified: Secondary | ICD-10-CM | POA: Diagnosis not present

## 2018-08-12 DIAGNOSIS — E782 Mixed hyperlipidemia: Secondary | ICD-10-CM

## 2018-08-12 NOTE — Patient Instructions (Signed)

## 2018-10-24 ENCOUNTER — Ambulatory Visit (INDEPENDENT_AMBULATORY_CARE_PROVIDER_SITE_OTHER): Payer: Medicare HMO | Admitting: Internal Medicine

## 2018-10-24 ENCOUNTER — Other Ambulatory Visit: Payer: Self-pay

## 2018-10-24 ENCOUNTER — Encounter (INDEPENDENT_AMBULATORY_CARE_PROVIDER_SITE_OTHER): Payer: Self-pay | Admitting: Internal Medicine

## 2018-10-24 VITALS — Wt 189.0 lb

## 2018-10-24 DIAGNOSIS — D500 Iron deficiency anemia secondary to blood loss (chronic): Secondary | ICD-10-CM

## 2018-10-24 DIAGNOSIS — K219 Gastro-esophageal reflux disease without esophagitis: Secondary | ICD-10-CM

## 2018-10-24 NOTE — Progress Notes (Signed)
Virtual Visit via Telephone Note   Patient had office visit scheduled today but he requested telephone visit as he did not want him or his sister to be exposed because of ongoing COVID-19 pandemic.  I agreed. I connected with Shyrl Numbers on 10/24/18 at 10:30 AM EDT by telephone and verified that I am speaking with the correct person using two identifiers.  Location: Patient: at home Provider: at office   I discussed the limitations, risks, security and privacy concerns of performing an evaluation and management service by telephone and the availability of in person appointments. I also discussed with the patient that there may be a patient responsible charge related to this service. The patient expressed understanding and agreed to proceed.   History of Present Illness:  Patient is 78 year old Caucasian male with multiple medical problems also has history of iron deficiency anemia secondary to chronic GI blood loss from small bowel in the setting of antiplatelet therapy.  He was last seen in June 2019. He states he is doing well.  He is staying home so that he would not come in contact with anyone with COVID.  He states his appetite is up and down but he has gained 5 pounds since his last visit.  His stool is black at times but he is on iron pill but he does not take it every day.  He feels his nipples get sore because of iron.  He denies rectal bleeding or abdominal pain.  He states he has intermittent heartburn at night.  He has no difficulty during the daytime.  He takes Prilosec in the morning and he is using Tums at night which helps.  He eats his supper at 6 PM and does not go to bed until 10 PM.  He denies nausea vomiting or dysphagia. He had blood work by Dr. Nevada Crane in April 2020.    Observations/Objective:  Patient reported his weight to be 189 pounds. He did not appear to be in any distress during phone conversation.  Hemoglobin on 07/15/2018 was 10.9. Hemoglobin on 01/06/2018  was 11.1.  Assessment and Plan:  #1.  History of iron deficiency anemia secondary to chronic GI bleed from small bowel angiodysplasia in the setting of antiplatelet therapy.  He has not had any overt bleeding and he is maintaining his hemoglobin at a respectable level.  Therefore no further work-up planned at this time.  #2.  GERD.  He is having nocturnal breakthrough symptoms for which she is using Tums.  If Tums does not work he can try famotidine OTC 20 mg every evening or as needed.  #3.  Breast nipple discomfort most likely secondary to Proscar and not due to iron.  Patient can discuss this symptom further with Dr. Nevada Crane on his urologist.  Follow Up Instructions:    Continues times on as-needed basis or take famotidine OTC 20 mg p.o. nightly as needed. Office visit in 1 year or earlier if he has rectal bleeding or significant drop in his H&H. I discussed the assessment and treatment plan with the patient. The patient was provided an opportunity to ask questions and all were answered. The patient agreed with the plan and demonstrated an understanding of the instructions.   The patient was advised to call back or seek an in-person evaluation if the symptoms worsen or if the condition fails to improve as anticipated.  I provided 10  minutes of non-face-to-face time during this encounter.   Hildred Laser, MD

## 2018-10-24 NOTE — Patient Instructions (Signed)
Patient advised to take at least 3 iron pills per week. He can use famotidine OTC 20 mg after evening meal or at bedtime on as-needed basis. Patient advised to call office if he has rectal bleeding or GERD symptoms worsen despite taking medication.

## 2018-12-07 DIAGNOSIS — E1140 Type 2 diabetes mellitus with diabetic neuropathy, unspecified: Secondary | ICD-10-CM | POA: Diagnosis not present

## 2018-12-07 DIAGNOSIS — D631 Anemia in chronic kidney disease: Secondary | ICD-10-CM | POA: Diagnosis not present

## 2018-12-07 DIAGNOSIS — D509 Iron deficiency anemia, unspecified: Secondary | ICD-10-CM | POA: Diagnosis not present

## 2018-12-07 DIAGNOSIS — E1122 Type 2 diabetes mellitus with diabetic chronic kidney disease: Secondary | ICD-10-CM | POA: Diagnosis not present

## 2018-12-07 DIAGNOSIS — N183 Chronic kidney disease, stage 3 (moderate): Secondary | ICD-10-CM | POA: Diagnosis not present

## 2018-12-07 DIAGNOSIS — E782 Mixed hyperlipidemia: Secondary | ICD-10-CM | POA: Diagnosis not present

## 2018-12-07 DIAGNOSIS — I10 Essential (primary) hypertension: Secondary | ICD-10-CM | POA: Diagnosis not present

## 2018-12-07 DIAGNOSIS — E1142 Type 2 diabetes mellitus with diabetic polyneuropathy: Secondary | ICD-10-CM | POA: Diagnosis not present

## 2018-12-12 DIAGNOSIS — R69 Illness, unspecified: Secondary | ICD-10-CM | POA: Diagnosis not present

## 2018-12-12 DIAGNOSIS — E1140 Type 2 diabetes mellitus with diabetic neuropathy, unspecified: Secondary | ICD-10-CM | POA: Diagnosis not present

## 2018-12-12 DIAGNOSIS — I129 Hypertensive chronic kidney disease with stage 1 through stage 4 chronic kidney disease, or unspecified chronic kidney disease: Secondary | ICD-10-CM | POA: Diagnosis not present

## 2018-12-12 DIAGNOSIS — I2510 Atherosclerotic heart disease of native coronary artery without angina pectoris: Secondary | ICD-10-CM | POA: Diagnosis not present

## 2018-12-12 DIAGNOSIS — N183 Chronic kidney disease, stage 3 (moderate): Secondary | ICD-10-CM | POA: Diagnosis not present

## 2018-12-12 DIAGNOSIS — K279 Peptic ulcer, site unspecified, unspecified as acute or chronic, without hemorrhage or perforation: Secondary | ICD-10-CM | POA: Diagnosis not present

## 2018-12-12 DIAGNOSIS — J441 Chronic obstructive pulmonary disease with (acute) exacerbation: Secondary | ICD-10-CM | POA: Diagnosis not present

## 2018-12-12 DIAGNOSIS — D631 Anemia in chronic kidney disease: Secondary | ICD-10-CM | POA: Diagnosis not present

## 2018-12-12 DIAGNOSIS — E782 Mixed hyperlipidemia: Secondary | ICD-10-CM | POA: Diagnosis not present

## 2018-12-12 DIAGNOSIS — E1122 Type 2 diabetes mellitus with diabetic chronic kidney disease: Secondary | ICD-10-CM | POA: Diagnosis not present

## 2018-12-12 DIAGNOSIS — G9009 Other idiopathic peripheral autonomic neuropathy: Secondary | ICD-10-CM | POA: Diagnosis not present

## 2019-02-21 ENCOUNTER — Telehealth: Payer: Self-pay | Admitting: Cardiology

## 2019-02-21 NOTE — Telephone Encounter (Signed)
Virtual Visit Pre-Appointment Phone Call  "(Name), I am calling you today to discuss your upcoming appointment. We are currently trying to limit exposure to the virus that causes COVID-19 by seeing patients at home rather than in the office."  "What is the BEST phone number to call the day of the visit?" -   [phone]  1. Do you have or have access to (through a family member/friend) a smartphone with video capability that we can use for your visit?" a. If yes - list this number in appt notes as cell (if different from BEST phone #) and list the appointment type as a VIDEO visit in appointment notes b. If no - list the appointment type as a PHONE visit in appointment notes  2. Confirm consent - "In the setting of the current Covid19 crisis, you are scheduled for a (phone or video) visit with your provider on (date) at (time).  Just as we do with many in-office visits, in order for you to participate in this visit, we must obtain consent.  If you'd like, I can send this to your mychart (if signed up) or email for you to review.  Otherwise, I can obtain your verbal consent now.  All virtual visits are billed to your insurance company just like a normal visit would be.  By agreeing to a virtual visit, we'd like you to understand that the technology does not allow for your provider to perform an examination, and thus may limit your provider's ability to fully assess your condition. If your provider identifies any concerns that need to be evaluated in person, we will make arrangements to do so.  Finally, though the technology is pretty good, we cannot assure that it will always work on either your or our end, and in the setting of a video visit, we may have to convert it to a phone-only visit.  In either situation, we cannot ensure that we have a secure connection.  Are you willing to proceed?" STAFF: Did the patient verbally acknowledge consent to telehealth visit? Document YES/NO here: yes    3. Advise patient to be prepared - "Two hours prior to your appointment, go ahead and check your blood pressure, pulse, oxygen saturation, and your weight (if you have the equipment to check those) and write them all down. When your visit starts, your provider will ask you for this information. If you have an Apple Watch or Kardia device, please plan to have heart rate information ready on the day of your appointment. Please have a pen and paper handy nearby the day of the visit as well."  4. Give patient instructions for MyChart download to smartphone OR Doximity/Doxy.me as below if video visit (depending on what platform provider is using)  5. Inform patient they will receive a phone call 15 minutes prior to their appointment time (may be from unknown caller ID) so they should be prepared to answer    TELEPHONE CALL NOTE  Donald Gaines has been deemed a candidate for a follow-up tele-health visit to limit community exposure during the Covid-19 pandemic. I spoke with the patient via phone to ensure availability of phone/video source, confirm preferred email & phone number, and discuss instructions and expectations.  I reminded Donald Gaines to be prepared with any vital sign and/or heart rhythm information that could potentially be obtained via home monitoring, at the time of his visit. I reminded Donald Gaines to expect a phone call prior to his visit.  Lynnda Child Slaughter 02/21/2019 11:46 AM   INSTRUCTIONS FOR DOWNLOADING THE MYCHART APP TO SMARTPHONE  - The patient must first make sure to have activated MyChart and know their login information - If Apple, go to [employer] and type in MyChart in the search bar and download the app. If Android, ask patient to go to [employer] and type in [place] in the search bar and download the app. The app is free but as with any other app downloads, their phone may require them to verify saved payment information or Apple/Android password.   - The patient will need to then log into the app with their MyChart username and password, and select [facility] as their healthcare provider to link the account. When it is time for your visit, go to the MyChart app, find appointments, and click Begin Video Visit. Be sure to Select Allow for your device to access the Microphone and Camera for your visit. You will then be connected, and your provider will be with you shortly.  **If they have any issues connecting, or need assistance please contact MyChart service desk [phone])**  **If using a computer, in order to ensure the best quality for their visit they will need to use either of the following Internet Browsers: [employer], or Google Chrome**  IF USING DOXIMITY or DOXY.ME - The patient will receive a link just prior to their visit by text.     FULL LENGTH CONSENT FOR TELE-HEALTH VISIT   I hereby voluntarily request, consent and authorize [facility] and its employed or contracted physicians, physician assistants, nurse practitioners or other licensed health care professionals (the Practitioner), to provide me with telemedicine health care services (the Services") as deemed necessary by the treating Practitioner. I acknowledge and consent to receive the Services by the Practitioner via telemedicine. I understand that the telemedicine visit will involve communicating with the Practitioner through live audiovisual communication technology and the disclosure of certain medical information by electronic transmission. I acknowledge that I have been given the opportunity to request an in-person assessment or other available alternative prior to the telemedicine visit and am voluntarily participating in the telemedicine visit.  I understand that I have the right to withhold or withdraw my consent to the use of telemedicine in the course of my care at any time, without affecting my right to future care or treatment, and that  the Practitioner or I may terminate the telemedicine visit at any time. I understand that I have the right to inspect all information obtained and/or recorded in the course of the telemedicine visit and may receive copies of available information for a reasonable fee.  I understand that some of the potential risks of receiving the Services via telemedicine include:   Delay or interruption in medical evaluation due to technological equipment failure or disruption;  Information transmitted may not be sufficient (e.g. poor resolution of images) to allow for appropriate medical decision making by the Practitioner; and/or   In rare instances, security protocols could fail, causing a breach of personal health information.  Furthermore, I acknowledge that it is my responsibility to provide information about my medical history, conditions and care that is complete and accurate to the best of my ability. I acknowledge that Practitioner's advice, recommendations, and/or decision may be based on factors not within their control, such as incomplete or inaccurate data provided by me or distortions of diagnostic images or specimens that may result from electronic transmissions. I understand that the  practice of medicine is not an Chief Strategy Officer and that Practitioner makes no warranties or guarantees regarding treatment outcomes. I acknowledge that I will receive a copy of this consent concurrently upon execution via email to the email address I last provided but may also request a printed copy by calling the office of [place].    I understand that my insurance will be billed for this visit.   I have read or had this consent read to me.  I understand the contents of this consent, which adequately explains the benefits and risks of the Services being provided via telemedicine.   I have been provided ample opportunity to ask questions regarding this consent and the Services and have had my questions answered to  my satisfaction.  I give my informed consent for the services to be provided through the use of telemedicine in my medical care  By participating in this telemedicine visit I agree to the above.

## 2019-02-24 ENCOUNTER — Encounter: Payer: Self-pay | Admitting: Cardiology

## 2019-02-24 ENCOUNTER — Encounter: Payer: Self-pay | Admitting: *Deleted

## 2019-02-24 ENCOUNTER — Telehealth (INDEPENDENT_AMBULATORY_CARE_PROVIDER_SITE_OTHER): Payer: Medicare HMO | Admitting: Cardiology

## 2019-02-24 DIAGNOSIS — I25119 Atherosclerotic heart disease of native coronary artery with unspecified angina pectoris: Secondary | ICD-10-CM | POA: Diagnosis not present

## 2019-02-24 DIAGNOSIS — Z720 Tobacco use: Secondary | ICD-10-CM

## 2019-02-24 DIAGNOSIS — I4821 Permanent atrial fibrillation: Secondary | ICD-10-CM | POA: Diagnosis not present

## 2019-02-24 DIAGNOSIS — E782 Mixed hyperlipidemia: Secondary | ICD-10-CM

## 2019-02-24 NOTE — Patient Instructions (Addendum)

## 2019-02-24 NOTE — Progress Notes (Signed)
Virtual Visit via Telephone Note   This visit type was conducted due to national recommendations for restrictions regarding the COVID-19 Pandemic (e.g. social distancing) in an effort to limit this patient's exposure and mitigate transmission in our community.  Due to his co-morbid illnesses, this patient is at least at moderate risk for complications without adequate follow up.  This format is felt to be most appropriate for this patient at this time.  The patient did not have access to video technology/had technical difficulties with video requiring transitioning to audio format only (telephone).  All issues noted in this document were discussed and addressed.  No physical exam could be performed with this format.  Please refer to the patient's chart for his  consent to telehealth for [facility]   Date:  02/24/2019   ID:  Donald Gaines, DOB 1940-05-03, MRN [id number]  Patient Location: Home Provider Location: Office  PCP:  Celene Squibb, MD  Cardiologist:  Rozann Lesches, MD Electrophysiologist:  None   Evaluation Performed:  Follow-Up Visit  Chief Complaint:  Cardiac follow-up  History of Present Illness:    Donald Gaines is a 78 y.o. male last assessed via telehealth encounter in May.  We spoke by phone today.  He does not report any progressive angina symptoms or increasing nitroglycerin use, got a fresh bottle about 3 months ago.  He has been staying around his house mostly.  He tells me that he does wear a mask when he goes out to the grocery store.  He is not anticoagulated for atrial fibrillation with prior history of GI bleeding and AVMs.  He does not report any palpitations.  I went over his medications.  Current cardiac regimen includes Coreg, Plavix, Imdur, lisinopril, and Crestor.  The patient does not have symptoms concerning for COVID-19 infection (fever, chills, cough, or new shortness of breath).    Past Medical History:  Diagnosis Date  . Arteriovenous  malformation small bowel    Capsule study 3/10  . Bilateral carotid artery stenosis   . BPH (benign prostatic hypertrophy)   . Candida esophagitis ([place])   . Cataract   . Chronic anemia   . Chronic diarrhea   . COPD (chronic obstructive pulmonary disease) ([place])   . Coronary atherosclerosis of native coronary artery    Multivessel, BMS SVG TO RCA 2000, reportedly  "2" additional stents in interim  . Dementia ([place])   . Depression   . Diabetes mellitus, type 2 ([place])   . Essential hypertension   . GERD (gastroesophageal reflux disease)   . GI bleeding    Cecal ulcers, arteriovenous malformations  . Glaucoma   . Hemorrhoids   . Hyperlipidemia   . Myocardial infarction ([place])    1994  . Portal hypertensive gastropathy ([place]) 11/08/2012   EGD. Dr. Laural Golden  . Restrictive lung disease   . Sleep apnea   . TIA (transient ischemic attack)    2005   Past Surgical History:  Procedure Laterality Date  . CAPSULE ENDO  03/10  . cardiac catherization  04/2012  . CARDIAC CATHETERIZATION  04/05/1996 [name]   EF 45%, occluded SVG to circumflex, patent SVG to D1 and PDA and patent LIMA to LAD with severe native vessel disease.  Marland Kitchen CARDIAC CATHETERIZATION  08/22/1998 [name]   Mild LM, severe LAD,severe CX, occlude RCA, patient  SVG to diatal RCA, patent vein graft to D1 and patent LIMA to LAD.   Marland Kitchen CATARACT EXTRACTION W/PHACO Right 09/22/2012  Procedure: CATARACT EXTRACTION PHACO AND INTRAOCULAR LENS PLACEMENT (IOC);  Surgeon: Tonny Branch, MD;  Location: [facility];  Service: Ophthalmology;  Laterality: Right;  CDE: 11.43  . CATARACT EXTRACTION W/PHACO Left 10/17/2012   Procedure: CATARACT EXTRACTION PHACO AND INTRAOCULAR LENS PLACEMENT (IOC);  Surgeon: Tonny Branch, MD;  Location: [facility];  Service: Ophthalmology;  Laterality: Left;  CDE: 8.06  . COLONOSCOPY  APR 2008   SIMPLE ADENOMA, [place] TICS, IH  . COLONOSCOPY  FEB 2008 ANEMIA. MELENA    2 LARGE ILEOCEAL ULCERS 2o to ASA, [place] TICS, IH  .  COLONOSCOPY  SEP 2009 TRANSFUSION DEP ANEMIA   AC AVM-ABLATED, [place] TICS, IH  . COLONOSCOPY WITH ESOPHAGOGASTRODUODENOSCOPY (EGD) N/A 11/08/2012   Procedure: COLONOSCOPY WITH ESOPHAGOGASTRODUODENOSCOPY (EGD);  Surgeon: Rogene Houston, MD;  Location[facility];  Service: Endoscopy;  Laterality: N/A;  250-rescheduled to 7:30 Ann to notify pt  . CORONARY ANGIOPLASTY WITH STENT PLACEMENT  08/22/1998 [name]   TEC stenting of the SVG to RCA-Last seen by cardiologist in 2010.  Marland Kitchen CORONARY ARTERY BYPASS GRAFT  1995-triple bypass   LIMA-LAD, LIMA to intermediate branch, SVG to PD and second OM,  . ESOPHAGOGASTRODUODENOSCOPY  SEP 09   DUODENAL LIPOMA  . ESOPHAGOGASTRODUODENOSCOPY N/A 09/27/2013   Procedure: ESOPHAGOGASTRODUODENOSCOPY (EGD) Push enteroscopy;  Surgeon: Rogene Houston, MD;  [facility];  Service: Endoscopy;  Laterality: N/A;  . GIVENS CAPSULE STUDY N/A 11/24/2012   Procedure: GIVENS CAPSULE STUDY;  Surgeon: Rogene Houston, MD;  [facility];  Service: Endoscopy;  Laterality: N/A;  730  . HOT HEMOSTASIS  09/27/2013   Procedure: HOT HEMOSTASIS (ARGON PLASMA COAGULATION/BICAP);  Surgeon: Rogene Houston, MD;  Location: [facility];  Service: Endoscopy;;  . HYDROGEN BREATH TEST  2009  . RIGHT INGUINAL HERNIA REPAIR    . UPPER GASTROINTESTINAL ENDOSCOPY  SEP 2009   GASTRIC AVM ABLATED, NL DUO Bx     No outpatient medications have been marked as taking for the 02/24/19 encounter (Telemedicine) with Satira Sark, MD.     Allergies:   Penicillins   Social History   Tobacco Use  . Smoking status: Current Every Day Smoker    Packs/day: 1.00    Years: 60.00    Pack years: 60.00    Types: Cigarettes    Start date: 01/02/1949  . Smokeless tobacco: Never Used  . Tobacco comment: 1 pack a day  Substance Use Topics  . Alcohol use: No    Alcohol/week: 0.0 standard drinks    Comment: No etoh in 12 yrs.  . Drug use: Yes    Types: Marijuana     Family  Hx: The patient's family history includes Heart attack in his mother; Heart disease in his mother; Hypertension in his mother.  ROS:   Please see the history of present illness. All other systems reviewed and are negative.   Prior CV studies:   The following studies were reviewed today:  Lexiscan Myoview 11/23/2013: IMPRESSION: 1. Large inferolateral wall scar with no peri-infarct ischemia.  2. Decreased left ventricular systolic function with inferolateral wall hypokinesis.  3. Left ventricular ejection fraction 39%  4. High-risk stress test findings based on low ejection fraction, recommend correlate findings with echocardiogram. There is no current myocardium at jeopardy*.  Echocardiogram 02/10/2017: Study Conclusions  - Left ventricle: The cavity size was normal. Wall thickness was increased in a pattern of moderate LVH. Systolic function was vigorous. The estimated ejection fraction was in the  range of 65% to 70%. Although no diagnostic regional wall motion abnormality was identified, this possibility cannot be completely excluded on the basis of this study. The study is not technically sufficient to allow evaluation of LV diastolic function. - Aortic valve: Mildly calcified annulus. Trileaflet; mildly calcified leaflets. - Mitral valve: Mildly thickened leaflets . There was mild regurgitation. - Left atrium: The atrium was moderately dilated. - Atrial septum: No defect or patent foramen ovale was identified. - Tricuspid valve: There was mild regurgitation. Peak RV-RA gradient (S): 30 mm Hg. - Inferior vena cava: Not well visualized. - Pericardium, extracardiac: There was no pericardial effusion.  Impressions:  - Moderate LVH with LVEF 65-70%. Indeterminate diastolic function. Moderate left atrial enlargement. Mildly thickened mitral leaflets with mild mitral regurgitation. Mildly sclerotic aortic valve. Mild tricuspid regurgitation  with RV-RA gradient 30 mmHg.  Labs/Other Tests and Data Reviewed:    EKG:  An ECG dated 08/09/2017 was personally reviewed today and demonstrated:  Rate controlled atrial fibrillation with incomplete right bundle branch block, diffuse nonspecific ST-T changes.  Recent Labs:  April 2020: Hemoglobin 10.9, platelets 155, BUN 18, creatinine 1.94, potassium 4.4, AST 14, ALT 7, cholesterol 115, triglycerides 83, HDL 41, LDL 57, hemoglobin A1c 6.1%  Wt Readings from Last 3 Encounters:  02/24/19 172 lb (78 kg)  10/24/18 189 lb (85.7 kg)  02/11/18 187 lb 12.8 oz (85.2 kg)     Objective:    Vital Signs:  Ht 5\' 5"  (1.651 m)   Wt 172 lb (78 kg)   BMI 28.62 kg/m    He was not able to check vital signs today. Patient spoke in full sentences, not short of breath. No audible wheezing or coughing. Speech pattern normal.  ASSESSMENT & PLAN:    1.  CAD status post CABG and PCI with stable angina symptoms on medical therapy.  We have not pursued follow-up ischemic evaluation in the absence of worsening symptoms.  He continues on Plavix, Coreg, Imdur, lisinopril, and Crestor.  2.  Permanent atrial fibrillation.  He reports no palpitations.  Heart rate control has been adequate on Coreg over time.  As noted previously, he is not anticoagulated with history of GI bleeding and AVMs.  3.  Mixed hyperlipidemia on Crestor.  Last LDL was 57.  4.  COPD with longstanding tobacco abuse.  COVID-19 Education: The signs and symptoms of COVID-19 were discussed with the patient and how to seek care for testing (follow up with PCP or arrange E-visit).  The importance of social distancing was discussed today.  Time:   Today, I have spent 6 minutes with the patient with telehealth technology discussing the above problems.     Medication Adjustments/Labs and Tests Ordered: Current medicines are reviewed at length with the patient today.  Concerns regarding medicines are outlined above.   Tests Ordered: No  orders of the defined types were placed in this encounter.   Medication Changes: No orders of the defined types were placed in this encounter.   Follow Up:  Either In Person or Virtual 6 months.  Signed, Rozann Lesches, MD  02/24/2019 3:41 PM    [place]

## 2019-02-28 DIAGNOSIS — J441 Chronic obstructive pulmonary disease with (acute) exacerbation: Secondary | ICD-10-CM | POA: Diagnosis not present

## 2019-06-14 DIAGNOSIS — E1142 Type 2 diabetes mellitus with diabetic polyneuropathy: Secondary | ICD-10-CM | POA: Diagnosis not present

## 2019-06-14 DIAGNOSIS — E1122 Type 2 diabetes mellitus with diabetic chronic kidney disease: Secondary | ICD-10-CM | POA: Diagnosis not present

## 2019-06-14 DIAGNOSIS — R69 Illness, unspecified: Secondary | ICD-10-CM | POA: Diagnosis not present

## 2019-06-14 DIAGNOSIS — D631 Anemia in chronic kidney disease: Secondary | ICD-10-CM | POA: Diagnosis not present

## 2019-06-14 DIAGNOSIS — E663 Overweight: Secondary | ICD-10-CM | POA: Diagnosis not present

## 2019-06-14 DIAGNOSIS — E1140 Type 2 diabetes mellitus with diabetic neuropathy, unspecified: Secondary | ICD-10-CM | POA: Diagnosis not present

## 2019-06-14 DIAGNOSIS — D509 Iron deficiency anemia, unspecified: Secondary | ICD-10-CM | POA: Diagnosis not present

## 2019-06-14 DIAGNOSIS — E782 Mixed hyperlipidemia: Secondary | ICD-10-CM | POA: Diagnosis not present

## 2019-06-14 DIAGNOSIS — E876 Hypokalemia: Secondary | ICD-10-CM | POA: Diagnosis not present

## 2019-06-19 DIAGNOSIS — I129 Hypertensive chronic kidney disease with stage 1 through stage 4 chronic kidney disease, or unspecified chronic kidney disease: Secondary | ICD-10-CM | POA: Diagnosis not present

## 2019-06-19 DIAGNOSIS — D631 Anemia in chronic kidney disease: Secondary | ICD-10-CM | POA: Diagnosis not present

## 2019-06-19 DIAGNOSIS — E1140 Type 2 diabetes mellitus with diabetic neuropathy, unspecified: Secondary | ICD-10-CM | POA: Diagnosis not present

## 2019-06-19 DIAGNOSIS — I2510 Atherosclerotic heart disease of native coronary artery without angina pectoris: Secondary | ICD-10-CM | POA: Diagnosis not present

## 2019-06-19 DIAGNOSIS — E1122 Type 2 diabetes mellitus with diabetic chronic kidney disease: Secondary | ICD-10-CM | POA: Diagnosis not present

## 2019-06-19 DIAGNOSIS — E782 Mixed hyperlipidemia: Secondary | ICD-10-CM | POA: Diagnosis not present

## 2019-06-19 DIAGNOSIS — J441 Chronic obstructive pulmonary disease with (acute) exacerbation: Secondary | ICD-10-CM | POA: Diagnosis not present

## 2019-06-19 DIAGNOSIS — K279 Peptic ulcer, site unspecified, unspecified as acute or chronic, without hemorrhage or perforation: Secondary | ICD-10-CM | POA: Diagnosis not present

## 2019-06-19 DIAGNOSIS — G9009 Other idiopathic peripheral autonomic neuropathy: Secondary | ICD-10-CM | POA: Diagnosis not present

## 2019-06-19 DIAGNOSIS — R69 Illness, unspecified: Secondary | ICD-10-CM | POA: Diagnosis not present

## 2019-07-26 DIAGNOSIS — J310 Chronic rhinitis: Secondary | ICD-10-CM | POA: Diagnosis not present

## 2019-07-26 DIAGNOSIS — J329 Chronic sinusitis, unspecified: Secondary | ICD-10-CM | POA: Diagnosis not present

## 2019-07-26 DIAGNOSIS — E559 Vitamin D deficiency, unspecified: Secondary | ICD-10-CM | POA: Diagnosis not present

## 2019-08-28 ENCOUNTER — Telehealth: Payer: Self-pay | Admitting: Cardiology

## 2019-08-28 NOTE — Telephone Encounter (Signed)
  Patient Consent for Virtual Visit         Donald Gaines has provided verbal consent on 08/28/2019 for a virtual visit (video or telephone).   CONSENT FOR VIRTUAL VISIT FOR:  Donald Gaines  By participating in this virtual visit I agree to the following:  I hereby voluntarily request, consent and authorize [facility] and its employed or contracted physicians, physician assistants, nurse practitioners or other licensed health care professionals (the Practitioner), to provide me with telemedicine health care services (the "Services") as deemed necessary by the treating Practitioner. I acknowledge and consent to receive the Services by the Practitioner via telemedicine. I understand that the telemedicine visit will involve communicating with the Practitioner through live audiovisual communication technology and the disclosure of certain medical information by electronic transmission. I acknowledge that I have been given the opportunity to request an in-person assessment or other available alternative prior to the telemedicine visit and am voluntarily participating in the telemedicine visit.  I understand that I have the right to withhold or withdraw my consent to the use of telemedicine in the course of my care at any time, without affecting my right to future care or treatment, and that the Practitioner or I may terminate the telemedicine visit at any time. I understand that I have the right to inspect all information obtained and/or recorded in the course of the telemedicine visit and may receive copies of available information for a reasonable fee.  I understand that some of the potential risks of receiving the Services via telemedicine include:  Marland Kitchen Delay or interruption in medical evaluation due to technological equipment failure or disruption; . Information transmitted may not be sufficient (e.g. poor resolution of images) to allow for appropriate medical decision making by the  Practitioner; and/or  . In rare instances, security protocols could fail, causing a breach of personal health information.  Furthermore, I acknowledge that it is my responsibility to provide information about my medical history, conditions and care that is complete and accurate to the best of my ability. I acknowledge that Practitioner's advice, recommendations, and/or decision may be based on factors not within their control, such as incomplete or inaccurate data provided by me or distortions of diagnostic images or specimens that may result from electronic transmissions. I understand that the practice of medicine is not an exact science and that Practitioner makes no warranties or guarantees regarding treatment outcomes. I acknowledge that a copy of this consent can be made available to me via my patient portal ([facility]), or I can request a printed copy by calling the office of [facility].    I understand that my insurance will be billed for this visit.   I have read or had this consent read to me. . I understand the contents of this consent, which adequately explains the benefits and risks of the Services being provided via telemedicine.  . I have been provided ample opportunity to ask questions regarding this consent and the Services and have had my questions answered to my satisfaction. . I give my informed consent for the services to be provided through the use of telemedicine in my medical care

## 2019-08-29 ENCOUNTER — Telehealth (INDEPENDENT_AMBULATORY_CARE_PROVIDER_SITE_OTHER): Payer: Medicare HMO | Admitting: Cardiology

## 2019-08-29 ENCOUNTER — Encounter: Payer: Self-pay | Admitting: Cardiology

## 2019-08-29 VITALS — HR 76 | Ht 65.0 in | Wt 171.0 lb

## 2019-08-29 DIAGNOSIS — Z720 Tobacco use: Secondary | ICD-10-CM

## 2019-08-29 DIAGNOSIS — I4821 Permanent atrial fibrillation: Secondary | ICD-10-CM

## 2019-08-29 DIAGNOSIS — I25119 Atherosclerotic heart disease of native coronary artery with unspecified angina pectoris: Secondary | ICD-10-CM | POA: Diagnosis not present

## 2019-08-29 DIAGNOSIS — E782 Mixed hyperlipidemia: Secondary | ICD-10-CM | POA: Diagnosis not present

## 2019-08-29 NOTE — Patient Instructions (Signed)
Medication Instructions:  °Your physician recommends that you continue on your current medications as directed. Please refer to the Current Medication list given to you today. ° °*If you need a refill on your cardiac medications before your next appointment, please call your pharmacy* ° ° °Lab Work: °None today °If you have labs (blood work) drawn today and your tests are completely normal, you will receive your results only by: °• MyChart Message (if you have MyChart) OR °• A paper copy in the mail °If you have any lab test that is abnormal or we need to change your treatment, we will call you to review the results. ° ° °Testing/Procedures: °None today ° ° °Follow-Up: °At [facility], you and your health needs are our priority.  As part of our continuing mission to provide you with exceptional heart care, we have created designated Provider Care Teams.  These Care Teams include your primary Cardiologist (physician) and Advanced Practice Providers (APPs -  Physician Assistants and Nurse Practitioners) who all work together to provide you with the care you need, when you need it. ° °We recommend signing up for the patient portal called "MyChart".  Sign up information is provided on this After Visit Summary.  MyChart is used to connect with patients for Virtual Visits (Telemedicine).  Patients are able to view lab/test results, encounter notes, upcoming appointments, etc.  Non-urgent messages can be sent to your provider as well.   °To learn more about what you can do with MyChart, go to [email or web address].   ° °Your next appointment:   °6 month(s) ° °The format for your next appointment:   °In Person ° °Provider:   °Samuel McDowell, MD ° ° °Other Instructions °None ° ° ° ° °Thank you for choosing [facility] ! ° ° ° ° ° ° ° ° °

## 2019-08-29 NOTE — Progress Notes (Signed)
Virtual Visit via Telephone Note   This visit type was conducted due to national recommendations for restrictions regarding the COVID-19 Pandemic (e.g. social distancing) in an effort to limit this patient's exposure and mitigate transmission in our community.  Due to his co-morbid illnesses, this patient is at least at moderate risk for complications without adequate follow up.  This format is felt to be most appropriate for this patient at this time.  The patient did not have access to video technology/had technical difficulties with video requiring transitioning to audio format only (telephone).  All issues noted in this document were discussed and addressed.  No physical exam could be performed with this format.  Please refer to the patient's chart for his  consent to telehealth for [facility].   The patient was identified using 2 identifiers.  Date:  08/29/2019   ID:  Donald Gaines, DOB 09/13/1940, MRN [id number]  Patient Location: Home Provider Location: Office  PCP:  Celene Squibb, MD  Cardiologist:  Rozann Lesches, MD Electrophysiologist:  None   Evaluation Performed:  Follow-Up Visit  Chief Complaint:  Cardiac follow-up  History of Present Illness:    Donald Gaines is a 79 y.o. male last assessed via telehealth encounter in December 2020.  We spoke by phone today.  He does not report any progressive angina symptoms at this time on medical therapy.  He does not get out of his house much at all.  He tells me that he has a sister that brings in his groceries and that his medications are delivered in a pack.  He did follow-up with Dr. Juel Burrow office earlier in the year and had lab work which we are requesting for review.  He is not anticoagulated for atrial fibrillation with prior history of GI bleeding and AVMs.   I reviewed his cardiac regimen which is stable and outlined below.   Past Medical History:  Diagnosis Date  . Arteriovenous malformation small bowel    Capsule study 3/10  . Bilateral carotid artery stenosis   . BPH (benign prostatic hypertrophy)   . Candida esophagitis ([place])   . Cataract   . Chronic anemia   . Chronic diarrhea   . COPD (chronic obstructive pulmonary disease) ([place])   . Coronary atherosclerosis of native coronary artery    Multivessel, BMS SVG TO RCA 2000, reportedly  "2" additional stents in interim  . Dementia ([place])   . Depression   . Diabetes mellitus, type 2 ([place])   . Essential hypertension   . GERD (gastroesophageal reflux disease)   . GI bleeding    Cecal ulcers, arteriovenous malformations  . Glaucoma   . Hemorrhoids   . Hyperlipidemia   . Myocardial infarction ([place])    1994  . Portal hypertensive gastropathy ([place]) 11/08/2012   EGD. Dr. Laural Golden  . Restrictive lung disease   . Sleep apnea   . TIA (transient ischemic attack)    2005   Past Surgical History:  Procedure Laterality Date  . CAPSULE ENDO  03/10  . cardiac catherization  04/2012  . CARDIAC CATHETERIZATION  04/05/1996 [name]   EF 45%, occluded SVG to circumflex, patent SVG to D1 and PDA and patent LIMA to LAD with severe native vessel disease.  Marland Kitchen CARDIAC CATHETERIZATION  08/22/1998 [name]   Mild LM, severe LAD,severe CX, occlude RCA, patient  SVG to diatal RCA, patent vein graft to D1 and patent LIMA to LAD.   Marland Kitchen CATARACT EXTRACTION W/PHACO Right 09/22/2012  Procedure: CATARACT EXTRACTION PHACO AND INTRAOCULAR LENS PLACEMENT (IOC);  Surgeon: Tonny Branch, MD;  Location: [facility] OR[facility];  Service: Ophthalmology;  Laterality: Right;  CDE: 11.43  . CATARACT EXTRACTION W/PHACO Left 10/17/2012   Procedure: CATARACT EXTRACTION PHACO AND INTRAOCULAR LENS PLACEMENT (IOC);  Surgeon: Tonny Branch, MD;  Location: [facility];  Service: Ophthalmology;  Laterality: Left;  CDE: 8.06  . COLONOSCOPY  APR 2008   SIMPLE ADENOMA, [place] TICS, IH  . COLONOSCOPY  FEB 2008 ANEMIA. MELENA    2 LARGE ILEOCEAL ULCERS 2o to ASA, [place] TICS, IH  . COLONOSCOPY  SEP 2009 TRANSFUSION DEP  ANEMIA   AC AVM-ABLATED, [place] TICS, IH  . COLONOSCOPY WITH ESOPHAGOGASTRODUODENOSCOPY (EGD) N/A 11/08/2012   Procedure: COLONOSCOPY WITH ESOPHAGOGASTRODUODENOSCOPY (EGD);  Surgeon: Rogene Houston, MD;  Location: [facility] SU[facility];  Service: Endoscopy;  Laterality: N/A;  250-rescheduled to 7:30 Ann to notify pt  . CORONARY ANGIOPLASTY WITH STENT PLACEMENT  08/22/1998 [name]   TEC stenting of the SVG to RCA-Last seen by cardiologist in 2010.  Marland Kitchen CORONARY ARTERY BYPASS GRAFT  1995-triple bypass   LIMA-LAD, LIMA to intermediate branch, SVG to PD and second OM,  . ESOPHAGOGASTRODUODENOSCOPY  SEP 09   DUODENAL LIPOMA  . ESOPHAGOGASTRODUODENOSCOPY N/A 09/27/2013   Procedure: ESOPHAGOGASTRODUODENOSCOPY (EGD) Push enteroscopy;  Surgeon: Rogene Houston, MD;  Location: [facility];  Service: Endoscopy;  Laterality: N/A;  . GIVENS CAPSULE STUDY N/A 11/24/2012   Procedure: GIVENS CAPSULE STUDY;  Surgeon: Rogene Houston, MD;  Location: [facility];  Service: Endoscopy;  Laterality: N/A;  730  . HOT HEMOSTASIS  09/27/2013   Procedure: HOT HEMOSTASIS (ARGON PLASMA COAGULATION/BICAP);  Surgeon: Rogene Houston, MD;  Location: [facility];  Service: Endoscopy;;  . HYDROGEN BREATH TEST  2009  . RIGHT INGUINAL HERNIA REPAIR    . UPPER GASTROINTESTINAL ENDOSCOPY  SEP 2009   GASTRIC AVM ABLATED, NL DUO Bx     Current Meds  Medication Sig  . acetaminophen (TYLENOL) 500 MG tablet Take 500 mg by mouth every 6 (six) hours as needed for headache.  . albuterol (PROVENTIL) (2.5 MG/3ML) 0.083% nebulizer solution Take 3 mLs (2.5 mg total) by nebulization every 2 (two) hours as needed for wheezing or shortness of breath.  . ALPRAZolam (XANAX) 0.5 MG tablet Take 0.5 mg by mouth 2 (two) times daily as needed for anxiety.   . carvedilol (COREG) 12.5 MG tablet Take 12.5 mg by mouth 2 (two) times daily with a meal.   . cetirizine (ZYRTEC) 10 MG tablet Take 10 mg by mouth at bedtime.   . clopidogrel (PLAVIX) 75 MG tablet  Take 1 tablet (75 mg total) by mouth every morning.  . escitalopram (LEXAPRO) 5 MG tablet Take 5 mg by mouth daily.   . finasteride (PROSCAR) 5 MG tablet Take 5 mg by mouth daily.  . furosemide (LASIX) 40 MG tablet Take 40 mg by mouth daily.  Marland Kitchen gabapentin (NEURONTIN) 100 MG capsule Take 100 mg by mouth at bedtime.   . isosorbide mononitrate (IMDUR) 60 MG 24 hr tablet Take 1 tablet (60 mg total) by mouth daily.  Marland Kitchen lisinopril (PRINIVIL,ZESTRIL) 2.5 MG tablet Take 2.5 mg by mouth daily.  Marland Kitchen NITROSTAT 0.4 MG SL tablet Place 0.4 mg under the tongue every 5 (five) minutes as needed for chest pain.   Marland Kitchen omeprazole (PRILOSEC) 20 MG capsule Take 20 mg by mouth daily.   . rosuvastatin (CRESTOR) 20 MG tablet Take 1 tablet (20 mg total)  by mouth daily.  Marland Kitchen umeclidinium-vilanterol (ANORO ELLIPTA) 62.5-25 MCG/INH AEPB Inhale 1 puff into the lungs daily.     Allergies:   Penicillins   ROS:   No sudden dizziness or syncope.  Trouble with memory loss.  Prior CV studies:   The following studies were reviewed today:  Lexiscan Myoview 11/23/2013: IMPRESSION: 1. Large inferolateral wall scar with no peri-infarct ischemia.  2. Decreased left ventricular systolic function with inferolateral wall hypokinesis.  3. Left ventricular ejection fraction 39%  4. High-risk stress test findings based on low ejection fraction, recommend correlate findings with echocardiogram. There is no current myocardium at jeopardy*.  Echocardiogram 02/10/2017: Study Conclusions  - Left ventricle: The cavity size was normal. Wall thickness was increased in a pattern of moderate LVH. Systolic function was vigorous. The estimated ejection fraction was in the range of 65% to 70%. Although no diagnostic regional wall motion abnormality was identified, this possibility cannot be completely excluded on the basis of this study. The study is not technically sufficient to allow evaluation of LV diastolic function. -  Aortic valve: Mildly calcified annulus. Trileaflet; mildly calcified leaflets. - Mitral valve: Mildly thickened leaflets . There was mild regurgitation. - Left atrium: The atrium was moderately dilated. - Atrial septum: No defect or patent foramen ovale was identified. - Tricuspid valve: There was mild regurgitation. Peak RV-RA gradient (S): 30 mm Hg. - Inferior vena cava: Not well visualized. - Pericardium, extracardiac: There was no pericardial effusion.  Impressions:  - Moderate LVH with LVEF 65-70%. Indeterminate diastolic function. Moderate left atrial enlargement. Mildly thickened mitral leaflets with mild mitral regurgitation. Mildly sclerotic aortic valve. Mild tricuspid regurgitation with RV-RA gradient 30 mmHg.  Labs/Other Tests and Data Reviewed:    EKG:  An ECG dated 08/09/2017 was personally reviewed today and demonstrated:  Rate controlled atrial fibrillation with incomplete right bundle branch block, diffuse nonspecific ST-T changes.  Recent Labs:  April 2020: Hemoglobin 10.9, platelets 155, BUN 18, creatinine 1.94, potassium 4.4, AST 14, ALT 7, cholesterol 115, triglycerides 83, HDL 41, LDL 57, hemoglobin A1c 6.1%  Wt Readings from Last 3 Encounters:  08/29/19 171 lb (77.6 kg)  02/24/19 172 lb (78 kg)  10/24/18 189 lb (85.7 kg)     Objective:    Vital Signs:  Pulse 76   Ht 5\' 5"  (1.651 m)   Wt 171 lb (77.6 kg)   SpO2 96%   BMI 28.46 kg/m    Patient spoke in full sentences, not short of breath. No audible wheezing or coughing.  ASSESSMENT & PLAN:    1.  Permanent atrial fibrillation.  He does not report any palpitations or syncope.  He is not anticoagulated with prior history of GI bleeding and AVMs.  Continue Coreg for heart rate control.  2.  CAD status post CABG and PCI.  He does not describe any progressive angina symptoms.  Continue observation on medical therapy including Plavix, Coreg, Imdur, lisinopril, and Crestor.  3.  Mixed  hyperlipidemia, he continues on Crestor.  Requesting interval lab work from Dr. Nevada Crane.  4.  COPD with ongoing tobacco abuse.  He has not been able to quit over time.  Time:   Today, I have spent 8 minutes with the patient with telehealth technology discussing the above problems.     Medication Adjustments/Labs and Tests Ordered: Current medicines are reviewed at length with the patient today.  Concerns regarding medicines are outlined above.   Tests Ordered: No orders of the defined types were placed in  this encounter.   Medication Changes: No orders of the defined types were placed in this encounter.   Follow Up:  In Person 6 months in the [place] office.  Signed, Rozann Lesches, MD  08/29/2019 10:05 AM    [place]

## 2019-10-24 ENCOUNTER — Other Ambulatory Visit: Payer: Self-pay

## 2019-10-24 ENCOUNTER — Ambulatory Visit (INDEPENDENT_AMBULATORY_CARE_PROVIDER_SITE_OTHER): Payer: Medicare HMO | Admitting: Internal Medicine

## 2019-10-24 ENCOUNTER — Encounter (INDEPENDENT_AMBULATORY_CARE_PROVIDER_SITE_OTHER): Payer: Self-pay | Admitting: Internal Medicine

## 2019-10-24 VITALS — BP 105/64 | HR 78 | Temp 98.1°F | Ht 66.0 in | Wt 183.7 lb

## 2019-10-24 DIAGNOSIS — K219 Gastro-esophageal reflux disease without esophagitis: Secondary | ICD-10-CM

## 2019-10-24 DIAGNOSIS — D500 Iron deficiency anemia secondary to blood loss (chronic): Secondary | ICD-10-CM

## 2019-10-24 NOTE — Patient Instructions (Signed)
Hemoccult x1 when stool is black. Please remember to ask Dr. Olivia Canter office to send a copy of your next blood work.

## 2019-10-24 NOTE — Progress Notes (Signed)
Presenting complaint;  Follow-up for GERD and history of iron deficiency anemia due to chronic GI blood loss.  Database and subjective:  Patient is 79 year old Caucasian male with multiple medical problems including diabetes mellitus depression GERD history of TIA as well as coronary artery disease who is here for scheduled visit.  His last visit was televisit 1 year ago. He has history of iron deficiency anemia secondary to small bowel GI bleed due to angiodysplasia.  This was initially discovered and worked up in 2014 and 2015.  He had colonoscopy, small bowel given capsule study as well as esophagogastroduodenoscopy. His hemoglobin in March this year was 11.4 g.  Patient states he is doing well from GI standpoint.  He says omeprazole is working.  He rarely has heartburn.  He denies nausea vomiting dysphagia or abdominal pain.  He reports intermittent black stools.  He says last episodes were 3 months ago.  He denies rectal bleeding.  His appetite is good.  He has gained 12 pounds in the last 1 year.  He states he decided to quit drinking Pepsi as of yesterday.  He states he has used nitroglycerin for chest pain twice in the last year but not recently. He has multiple other complaints which include dizziness which is more when he lies flat at night as well as temporal headache and somnolence.  He says he checks his O2 sat and is always above 90%.  He also complains of nocturia.  He has to get up 3 times every night. He continues to smoke cigarettes.  He smokes about a pack a day. He is on clopidogrel but does not take OTC NSAIDs and he remains on p.o. iron daily.  Current Medications: Outpatient Encounter Medications as of 10/24/2019  Medication Sig  . acetaminophen (TYLENOL) 500 MG tablet Take 500 mg by mouth every 6 (six) hours as needed for headache.  . albuterol (PROVENTIL) (2.5 MG/3ML) 0.083% nebulizer solution Take 3 mLs (2.5 mg total) by nebulization every 2 (two) hours as needed for  wheezing or shortness of breath.  . ALPRAZolam (XANAX) 0.5 MG tablet Take 0.5 mg by mouth 2 (two) times daily as needed for anxiety.   . carvedilol (COREG) 12.5 MG tablet Take 12.5 mg by mouth 2 (two) times daily with a meal.   . cetirizine (ZYRTEC) 10 MG tablet Take 10 mg by mouth at bedtime.   . clopidogrel (PLAVIX) 75 MG tablet Take 1 tablet (75 mg total) by mouth every morning.  . escitalopram (LEXAPRO) 5 MG tablet Take 5 mg by mouth daily.   . ferrous sulfate 325 (65 FE) MG tablet Take 325 mg by mouth daily with breakfast.  . finasteride (PROSCAR) 5 MG tablet Take 5 mg by mouth daily.  . furosemide (LASIX) 40 MG tablet Take 40 mg by mouth daily.  Marland Kitchen gabapentin (NEURONTIN) 100 MG capsule Take 100 mg by mouth at bedtime.   . isosorbide mononitrate (IMDUR) 60 MG 24 hr tablet Take 1 tablet (60 mg total) by mouth daily.  Marland Kitchen lisinopril (PRINIVIL,ZESTRIL) 2.5 MG tablet Take 2.5 mg by mouth daily.  Marland Kitchen NITROSTAT 0.4 MG SL tablet Place 0.4 mg under the tongue every 5 (five) minutes as needed for chest pain.   Marland Kitchen omeprazole (PRILOSEC) 20 MG capsule Take 20 mg by mouth daily.   . rosuvastatin (CRESTOR) 20 MG tablet Take 1 tablet (20 mg total) by mouth daily.  Marland Kitchen umeclidinium-vilanterol (ANORO ELLIPTA) 62.5-25 MCG/INH AEPB Inhale 1 puff into the lungs daily.   No  facility-administered encounter medications on file as of 10/24/2019.     Objective: Blood pressure 105/64, pulse 78, temperature 98.1 F (36.7 C), temperature source Oral, height '5\' 6"'  (1.676 m), weight 183 lb 11.2 oz (83.3 kg). Patient is alert and in no acute distress. He is wearing a mask. He was ambulated from chair to examination table without any difficulty. Conjunctiva is pink. Sclera is nonicteric Oropharyngeal mucosa is normal. No neck masses or thyromegaly noted. Cardiac exam with irregular rhythm normal S1 and S2. No murmur or gallop noted. Auscultation of lungs revealed rattling from upper airway.  No rales or rhonchi noted over  lung bases. Abdomen is full.  On palpation is soft.  He has mild tenderness in both iliac fossae but there is no guarding.  No organomegaly or masses. No LE edema or clubbing noted.  Labs/studies Results:  CBC Latest Ref Rng & Units 07/29/2015 07/28/2015 07/27/2015  WBC 4.0 - 10.5 K/uL 12.7(H) 10.5 13.8(H)  Hemoglobin 13.0 - 17.0 g/dL 9.8(L) 10.7(L) 12.3(L)  Hematocrit 39 - 52 % 29.1(L) 32.3(L) 36.4(L)  Platelets 150 - 400 K/uL 150 150 165    CMP Latest Ref Rng & Units 08/02/2015 08/01/2015 07/31/2015  Glucose 65 - 99 mg/dL 129(H) 118(H) 158(H)  BUN 6 - 20 mg/dL 50(H) 50(H) 48(H)  Creatinine 0.61 - 1.24 mg/dL 2.24(H) 2.17(H) 2.30(H)  Sodium 135 - 145 mmol/L 139 141 142  Potassium 3.5 - 5.1 mmol/L 3.9 4.4 4.5  Chloride 101 - 111 mmol/L 114(H) 115(H) 115(H)  CO2 22 - 32 mmol/L 20(L) 20(L) 19(L)  Calcium 8.9 - 10.3 mg/dL 8.3(L) 8.3(L) 8.5(L)  Total Protein 6.5 - 8.1 g/dL - - -  Total Bilirubin 0.3 - 1.2 mg/dL - - -  Alkaline Phos 38 - 126 U/L - - -  AST 15 - 41 U/L - - -  ALT 17 - 63 U/L - - -    Hepatic Function Latest Ref Rng & Units 07/27/2015 12/25/2014 06/06/2014  Total Protein 6.5 - 8.1 g/dL 8.1 6.8 6.8  Albumin 3.5 - 5.0 g/dL 4.2 3.9 3.5  AST 15 - 41 U/L '23 28 26  ' ALT 17 - 63 U/L 11(L) 22 13  Alk Phosphatase 38 - 126 U/L 89 83 111  Total Bilirubin 0.3 - 1.2 mg/dL 1.0 0.9 0.8  Bilirubin, Direct 0.0 - 0.5 mg/dL - - 0.3    Hemoglobin was 10.9 on 06/25/2018 Hemoglobin was 11.4 on 06/14/2019.  Assessment:  #1.  History of iron deficiency anemia secondary to chronic GI blood loss from small bowel angiodysplasia in the setting of antiplatelet therapy.  His hemoglobin has improved significantly compared to what it was 5 and 6 years ago.  His hemoglobin is not normal.  He may also have an element of chronic disease anemia. He does give history of black stools which may or may not be due to p.o. iron.  Will check Hemoccult when he has tarry stool.  #2.  Chronic GERD.  He is doing well with  therapy.  #3.  Patient has multiple non-GI symptoms such as somnolence bitemporal headache and dizziness.  He will bring these symptoms to attention of Dr. Nevada Crane at the time of his appointment later this month.   Plan:  Hemoccult x1 when stool is black. He will have blood work at the time of office visit with Dr. Nevada Crane later this month. Once again patient counseled for the need to quit cigarette smoking. Office visit in 1 year.

## 2019-10-26 ENCOUNTER — Emergency Department (HOSPITAL_COMMUNITY)
Admission: EM | Admit: 2019-10-26 | Discharge: 2019-10-26 | Disposition: A | Discharge status: Home / Self Care | Payer: Medicare HMO | Attending: Emergency Medicine | Admitting: Emergency Medicine

## 2019-10-26 ENCOUNTER — Encounter (HOSPITAL_COMMUNITY): Payer: Self-pay | Admitting: Emergency Medicine

## 2019-10-26 ENCOUNTER — Emergency Department (HOSPITAL_COMMUNITY): Payer: Medicare HMO

## 2019-10-26 ENCOUNTER — Other Ambulatory Visit: Payer: Self-pay

## 2019-10-26 DIAGNOSIS — M25462 Effusion, left knee: Secondary | ICD-10-CM | POA: Diagnosis not present

## 2019-10-26 DIAGNOSIS — Y999 Unspecified external cause status: Secondary | ICD-10-CM | POA: Insufficient documentation

## 2019-10-26 DIAGNOSIS — W19XXXA Unspecified fall, initial encounter: Secondary | ICD-10-CM | POA: Diagnosis not present

## 2019-10-26 DIAGNOSIS — Z5321 Procedure and treatment not carried out due to patient leaving prior to being seen by health care provider: Secondary | ICD-10-CM | POA: Insufficient documentation

## 2019-10-26 DIAGNOSIS — Y9289 Other specified places as the place of occurrence of the external cause: Secondary | ICD-10-CM | POA: Diagnosis not present

## 2019-10-26 DIAGNOSIS — M25562 Pain in left knee: Secondary | ICD-10-CM | POA: Diagnosis not present

## 2019-10-26 DIAGNOSIS — W228XXA Striking against or struck by other objects, initial encounter: Secondary | ICD-10-CM | POA: Diagnosis not present

## 2019-10-26 DIAGNOSIS — S0990XA Unspecified injury of head, initial encounter: Secondary | ICD-10-CM | POA: Diagnosis present

## 2019-10-26 DIAGNOSIS — Y939 Activity, unspecified: Secondary | ICD-10-CM | POA: Diagnosis not present

## 2019-10-26 DIAGNOSIS — R52 Pain, unspecified: Secondary | ICD-10-CM | POA: Diagnosis not present

## 2019-10-26 DIAGNOSIS — M1712 Unilateral primary osteoarthritis, left knee: Secondary | ICD-10-CM | POA: Diagnosis not present

## 2019-10-26 DIAGNOSIS — R609 Edema, unspecified: Secondary | ICD-10-CM | POA: Diagnosis not present

## 2019-10-26 NOTE — ED Notes (Signed)
No answer in waiting room X1,  

## 2019-10-26 NOTE — ED Notes (Signed)
No answer in waiting room X2,

## 2019-10-26 NOTE — ED Triage Notes (Signed)
Per EMS, pt fell at home yesterday. C/o LT knee pain and swelling and unable to ambulate due to pain. Pt also hit head on fridge when he fell. Denies LOC. AOX4.

## 2019-10-26 NOTE — ED Notes (Signed)
No answer in waiting room X3, registration advised that pt left at 18:29

## 2019-12-13 DIAGNOSIS — J310 Chronic rhinitis: Secondary | ICD-10-CM | POA: Diagnosis not present

## 2019-12-13 DIAGNOSIS — E1140 Type 2 diabetes mellitus with diabetic neuropathy, unspecified: Secondary | ICD-10-CM | POA: Diagnosis not present

## 2019-12-13 DIAGNOSIS — M19049 Primary osteoarthritis, unspecified hand: Secondary | ICD-10-CM | POA: Diagnosis not present

## 2019-12-13 DIAGNOSIS — J329 Chronic sinusitis, unspecified: Secondary | ICD-10-CM | POA: Diagnosis not present

## 2019-12-13 DIAGNOSIS — D631 Anemia in chronic kidney disease: Secondary | ICD-10-CM | POA: Diagnosis not present

## 2019-12-13 DIAGNOSIS — K922 Gastrointestinal hemorrhage, unspecified: Secondary | ICD-10-CM | POA: Diagnosis not present

## 2019-12-13 DIAGNOSIS — R69 Illness, unspecified: Secondary | ICD-10-CM | POA: Diagnosis not present

## 2019-12-13 DIAGNOSIS — K279 Peptic ulcer, site unspecified, unspecified as acute or chronic, without hemorrhage or perforation: Secondary | ICD-10-CM | POA: Diagnosis not present

## 2019-12-13 DIAGNOSIS — Q282 Arteriovenous malformation of cerebral vessels: Secondary | ICD-10-CM | POA: Diagnosis not present

## 2019-12-18 DIAGNOSIS — E1122 Type 2 diabetes mellitus with diabetic chronic kidney disease: Secondary | ICD-10-CM | POA: Diagnosis not present

## 2019-12-18 DIAGNOSIS — E1140 Type 2 diabetes mellitus with diabetic neuropathy, unspecified: Secondary | ICD-10-CM | POA: Diagnosis not present

## 2019-12-18 DIAGNOSIS — G9009 Other idiopathic peripheral autonomic neuropathy: Secondary | ICD-10-CM | POA: Diagnosis not present

## 2019-12-18 DIAGNOSIS — D631 Anemia in chronic kidney disease: Secondary | ICD-10-CM | POA: Diagnosis not present

## 2019-12-18 DIAGNOSIS — R69 Illness, unspecified: Secondary | ICD-10-CM | POA: Diagnosis not present

## 2019-12-18 DIAGNOSIS — K279 Peptic ulcer, site unspecified, unspecified as acute or chronic, without hemorrhage or perforation: Secondary | ICD-10-CM | POA: Diagnosis not present

## 2019-12-18 DIAGNOSIS — Z0001 Encounter for general adult medical examination with abnormal findings: Secondary | ICD-10-CM | POA: Diagnosis not present

## 2019-12-18 DIAGNOSIS — Z716 Tobacco abuse counseling: Secondary | ICD-10-CM | POA: Diagnosis not present

## 2019-12-18 DIAGNOSIS — E782 Mixed hyperlipidemia: Secondary | ICD-10-CM | POA: Diagnosis not present

## 2019-12-18 DIAGNOSIS — I2510 Atherosclerotic heart disease of native coronary artery without angina pectoris: Secondary | ICD-10-CM | POA: Diagnosis not present

## 2019-12-18 DIAGNOSIS — I129 Hypertensive chronic kidney disease with stage 1 through stage 4 chronic kidney disease, or unspecified chronic kidney disease: Secondary | ICD-10-CM | POA: Diagnosis not present

## 2019-12-18 DIAGNOSIS — Z20822 Contact with and (suspected) exposure to covid-19: Secondary | ICD-10-CM | POA: Diagnosis not present

## 2019-12-22 DIAGNOSIS — J9611 Chronic respiratory failure with hypoxia: Secondary | ICD-10-CM | POA: Diagnosis not present

## 2019-12-22 DIAGNOSIS — J449 Chronic obstructive pulmonary disease, unspecified: Secondary | ICD-10-CM | POA: Diagnosis not present

## 2020-01-02 DIAGNOSIS — J449 Chronic obstructive pulmonary disease, unspecified: Secondary | ICD-10-CM | POA: Diagnosis not present

## 2020-01-02 DIAGNOSIS — J9611 Chronic respiratory failure with hypoxia: Secondary | ICD-10-CM | POA: Diagnosis not present

## 2020-01-08 DIAGNOSIS — E1140 Type 2 diabetes mellitus with diabetic neuropathy, unspecified: Secondary | ICD-10-CM | POA: Diagnosis not present

## 2020-01-08 DIAGNOSIS — G9009 Other idiopathic peripheral autonomic neuropathy: Secondary | ICD-10-CM | POA: Diagnosis not present

## 2020-01-08 DIAGNOSIS — D631 Anemia in chronic kidney disease: Secondary | ICD-10-CM | POA: Diagnosis not present

## 2020-01-08 DIAGNOSIS — E782 Mixed hyperlipidemia: Secondary | ICD-10-CM | POA: Diagnosis not present

## 2020-01-08 DIAGNOSIS — K279 Peptic ulcer, site unspecified, unspecified as acute or chronic, without hemorrhage or perforation: Secondary | ICD-10-CM | POA: Diagnosis not present

## 2020-01-08 DIAGNOSIS — R69 Illness, unspecified: Secondary | ICD-10-CM | POA: Diagnosis not present

## 2020-01-08 DIAGNOSIS — E1122 Type 2 diabetes mellitus with diabetic chronic kidney disease: Secondary | ICD-10-CM | POA: Diagnosis not present

## 2020-01-08 DIAGNOSIS — Z716 Tobacco abuse counseling: Secondary | ICD-10-CM | POA: Diagnosis not present

## 2020-01-08 DIAGNOSIS — I2510 Atherosclerotic heart disease of native coronary artery without angina pectoris: Secondary | ICD-10-CM | POA: Diagnosis not present

## 2020-01-08 DIAGNOSIS — I129 Hypertensive chronic kidney disease with stage 1 through stage 4 chronic kidney disease, or unspecified chronic kidney disease: Secondary | ICD-10-CM | POA: Diagnosis not present

## 2020-02-26 DIAGNOSIS — J329 Chronic sinusitis, unspecified: Secondary | ICD-10-CM | POA: Diagnosis not present

## 2020-02-26 DIAGNOSIS — E1140 Type 2 diabetes mellitus with diabetic neuropathy, unspecified: Secondary | ICD-10-CM | POA: Diagnosis not present

## 2020-02-26 DIAGNOSIS — E559 Vitamin D deficiency, unspecified: Secondary | ICD-10-CM | POA: Diagnosis not present

## 2020-02-26 DIAGNOSIS — Z0001 Encounter for general adult medical examination with abnormal findings: Secondary | ICD-10-CM | POA: Diagnosis not present

## 2020-02-26 DIAGNOSIS — R809 Proteinuria, unspecified: Secondary | ICD-10-CM | POA: Diagnosis not present

## 2020-02-26 DIAGNOSIS — K279 Peptic ulcer, site unspecified, unspecified as acute or chronic, without hemorrhage or perforation: Secondary | ICD-10-CM | POA: Diagnosis not present

## 2020-02-26 DIAGNOSIS — J310 Chronic rhinitis: Secondary | ICD-10-CM | POA: Diagnosis not present

## 2020-02-26 DIAGNOSIS — Z23 Encounter for immunization: Secondary | ICD-10-CM | POA: Diagnosis not present

## 2020-02-26 DIAGNOSIS — M19049 Primary osteoarthritis, unspecified hand: Secondary | ICD-10-CM | POA: Diagnosis not present

## 2020-02-26 DIAGNOSIS — R69 Illness, unspecified: Secondary | ICD-10-CM | POA: Diagnosis not present

## 2020-02-26 DIAGNOSIS — D631 Anemia in chronic kidney disease: Secondary | ICD-10-CM | POA: Diagnosis not present

## 2020-02-26 DIAGNOSIS — N1832 Chronic kidney disease, stage 3b: Secondary | ICD-10-CM | POA: Diagnosis not present

## 2020-02-28 DIAGNOSIS — K279 Peptic ulcer, site unspecified, unspecified as acute or chronic, without hemorrhage or perforation: Secondary | ICD-10-CM | POA: Diagnosis not present

## 2020-02-28 DIAGNOSIS — I2510 Atherosclerotic heart disease of native coronary artery without angina pectoris: Secondary | ICD-10-CM | POA: Diagnosis not present

## 2020-02-28 DIAGNOSIS — G9009 Other idiopathic peripheral autonomic neuropathy: Secondary | ICD-10-CM | POA: Diagnosis not present

## 2020-02-28 DIAGNOSIS — E782 Mixed hyperlipidemia: Secondary | ICD-10-CM | POA: Diagnosis not present

## 2020-02-28 DIAGNOSIS — I129 Hypertensive chronic kidney disease with stage 1 through stage 4 chronic kidney disease, or unspecified chronic kidney disease: Secondary | ICD-10-CM | POA: Diagnosis not present

## 2020-02-28 DIAGNOSIS — Z716 Tobacco abuse counseling: Secondary | ICD-10-CM | POA: Diagnosis not present

## 2020-02-28 DIAGNOSIS — R69 Illness, unspecified: Secondary | ICD-10-CM | POA: Diagnosis not present

## 2020-02-28 DIAGNOSIS — E1140 Type 2 diabetes mellitus with diabetic neuropathy, unspecified: Secondary | ICD-10-CM | POA: Diagnosis not present

## 2020-02-28 DIAGNOSIS — E1122 Type 2 diabetes mellitus with diabetic chronic kidney disease: Secondary | ICD-10-CM | POA: Diagnosis not present

## 2020-02-28 DIAGNOSIS — D631 Anemia in chronic kidney disease: Secondary | ICD-10-CM | POA: Diagnosis not present

## 2020-03-01 ENCOUNTER — Ambulatory Visit: Payer: Medicare HMO | Admitting: Cardiology

## 2020-03-01 NOTE — Progress Notes (Deleted)
Cardiology Office Note  Date: 03/01/2020   ID: ISAIR INABINET, DOB 1941/01/11, MRN [id number]  PCP:  Curlene Labrum, MD  Cardiologist:  Rozann Lesches, MD Electrophysiologist:  None   No chief complaint on file.   History of Present Illness: Donald Gaines is a 79 y.o. male last assessed via telehealth encounter in June.  He is not anticoagulated for atrial fibrillation with prior history of GI bleeding and AVMs.  Past Medical History:  Diagnosis Date  . Arteriovenous malformation small bowel    Capsule study 3/10  . Bilateral carotid artery stenosis   . BPH (benign prostatic hypertrophy)   . Candida esophagitis ([place])   . Cataract   . Chronic anemia   . Chronic diarrhea   . COPD (chronic obstructive pulmonary disease) ([place])   . Coronary atherosclerosis of native coronary artery    Multivessel, BMS SVG TO RCA 2000, reportedly  "2" additional stents in interim  . Dementia ([place])   . Depression   . Diabetes mellitus, type 2 ([place])   . Essential hypertension   . GERD (gastroesophageal reflux disease)   . GI bleeding    Cecal ulcers, arteriovenous malformations  . Glaucoma   . Hemorrhoids   . Hyperlipidemia   . Myocardial infarction ([place])    1994  . Portal hypertensive gastropathy ([place]) 11/08/2012   EGD. Dr. Laural Golden  . Restrictive lung disease   . Sleep apnea   . TIA (transient ischemic attack)    2005    Past Surgical History:  Procedure Laterality Date  . CAPSULE ENDO  03/10  . cardiac catherization  04/2012  . CARDIAC CATHETERIZATION  04/05/1996 [name]   EF 45%, occluded SVG to circumflex, patent SVG to D1 and PDA and patent LIMA to LAD with severe native vessel disease.  Marland Kitchen CARDIAC CATHETERIZATION  08/22/1998 [name]   Mild LM, severe LAD,severe CX, occlude RCA, patient  SVG to diatal RCA, patent vein graft to D1 and patent LIMA to LAD.   Marland Kitchen CATARACT EXTRACTION W/PHACO Right 09/22/2012   Procedure: CATARACT EXTRACTION PHACO AND INTRAOCULAR  LENS PLACEMENT (IOC);  Surgeon: Tonny Branch, MD;  Location: [facility];  Service: Ophthalmology;  Laterality: Right;  CDE: 11.43  . CATARACT EXTRACTION W/PHACO Left 10/17/2012   Procedure: CATARACT EXTRACTION PHACO AND INTRAOCULAR LENS PLACEMENT (IOC);  Surgeon: Tonny Branch, MD;  Location: [facility];  Service: Ophthalmology;  Laterality: Left;  CDE: 8.06  . COLONOSCOPY  APR 2008   SIMPLE ADENOMA, [place] TICS, IH  . COLONOSCOPY  FEB 2008 ANEMIA. MELENA    2 LARGE ILEOCEAL ULCERS 2o to ASA, [place] TICS, IH  . COLONOSCOPY  SEP 2009 TRANSFUSION DEP ANEMIA   AC AVM-ABLATED, [place] TICS, IH  . COLONOSCOPY WITH ESOPHAGOGASTRODUODENOSCOPY (EGD) N/A 11/08/2012   Procedure: COLONOSCOPY WITH ESOPHAGOGASTRODUODENOSCOPY (EGD);  Surgeon: Rogene Houston, MD;  [facility];  Service: Endoscopy;  Laterality: N/A;  250-rescheduled to 7:30 Ann to notify pt  . CORONARY ANGIOPLASTY WITH STENT PLACEMENT  08/22/1998 [name]   TEC stenting of the SVG to RCA-Last seen by cardiologist in 2010.  Marland Kitchen CORONARY ARTERY BYPASS GRAFT  1995-triple bypass   LIMA-LAD, LIMA to intermediate branch, SVG to PD and second OM,  . ESOPHAGOGASTRODUODENOSCOPY  SEP 09   DUODENAL LIPOMA  . ESOPHAGOGASTRODUODENOSCOPY N/A 09/27/2013   Procedure: ESOPHAGOGASTRODUODENOSCOPY (EGD) Push enteroscopy;  Surgeon: Rogene Houston, MD;  [facility];  Service: Endoscopy;  Laterality: N/A;  . GIVENS CAPSULE STUDY  N/A 11/24/2012   Procedure: GIVENS CAPSULE STUDY;  Surgeon: Rogene Houston, MD;  [facility]: [facility];  Service: Endoscopy;  Laterality: N/A;  730  . HOT HEMOSTASIS  09/27/2013   Procedure: HOT HEMOSTASIS (ARGON PLASMA COAGULATION/BICAP);  Surgeon: Rogene Houston, MD;  Location: [facility];  Service: Endoscopy;;  . HYDROGEN BREATH TEST  2009  . RIGHT INGUINAL HERNIA REPAIR    . UPPER GASTROINTESTINAL ENDOSCOPY  SEP 2009   GASTRIC AVM ABLATED, NL DUO Bx    Current Outpatient Medications  Medication Sig Dispense Refill  .  acetaminophen (TYLENOL) 500 MG tablet Take 500 mg by mouth every 6 (six) hours as needed for headache.    . albuterol (PROVENTIL) (2.5 MG/3ML) 0.083% nebulizer solution Take 3 mLs (2.5 mg total) by nebulization every 2 (two) hours as needed for wheezing or shortness of breath. 75 mL 12  . ALPRAZolam (XANAX) 0.5 MG tablet Take 0.5 mg by mouth 2 (two) times daily as needed for anxiety.     . carvedilol (COREG) 12.5 MG tablet Take 12.5 mg by mouth 2 (two) times daily with a meal.     . cetirizine (ZYRTEC) 10 MG tablet Take 10 mg by mouth at bedtime.     . clopidogrel (PLAVIX) 75 MG tablet Take 1 tablet (75 mg total) by mouth every morning.    . escitalopram (LEXAPRO) 5 MG tablet Take 5 mg by mouth daily.     . ferrous sulfate 325 (65 FE) MG tablet Take 325 mg by mouth daily with breakfast.    . finasteride (PROSCAR) 5 MG tablet Take 5 mg by mouth daily.    . furosemide (LASIX) 40 MG tablet Take 40 mg by mouth daily.    Marland Kitchen gabapentin (NEURONTIN) 100 MG capsule Take 100 mg by mouth at bedtime.     . isosorbide mononitrate (IMDUR) 60 MG 24 hr tablet Take 1 tablet (60 mg total) by mouth daily. 30 tablet 11  . lisinopril (PRINIVIL,ZESTRIL) 2.5 MG tablet Take 2.5 mg by mouth daily.    Marland Kitchen NITROSTAT 0.4 MG SL tablet Place 0.4 mg under the tongue every 5 (five) minutes as needed for chest pain.     Marland Kitchen omeprazole (PRILOSEC) 20 MG capsule Take 20 mg by mouth daily.     . rosuvastatin (CRESTOR) 20 MG tablet Take 1 tablet (20 mg total) by mouth daily. 90 tablet 0  . umeclidinium-vilanterol (ANORO ELLIPTA) 62.5-25 MCG/INH AEPB Inhale 1 puff into the lungs daily.     No current facility-administered medications for this visit.   Allergies:  Penicillins   Social History: The patient  reports that he has been smoking cigarettes. He started smoking about 71 years ago. He has a 60.00 pack-year smoking history. He has never used smokeless tobacco. He reports current drug use. Drug: Marijuana. He reports that he does not  drink alcohol.   Family History: The patient's family history includes Heart attack in his mother; Heart disease in his mother; Hypertension in his mother.   ROS:  Please see the history of present illness. Otherwise, complete review of systems is positive for {NONE DEFAULTED:18576::"none"}.  All other systems are reviewed and negative.   Physical Exam: VS:  There were no vitals taken for this visit., BMI There is no height or weight on file to calculate BMI.  Wt Readings from Last 3 Encounters:  10/26/19 182 lb 15.7 oz (83 kg)  10/24/19 183 lb 11.2 oz (83.3 kg)  08/29/19 171 lb (77.6 kg)  General: Patient appears comfortable at rest. HEENT: Conjunctiva and lids normal, oropharynx clear with moist mucosa. Neck: Supple, no elevated JVP or carotid bruits, no thyromegaly. Lungs: Clear to auscultation, nonlabored breathing at rest. Cardiac: Regular rate and rhythm, no S3 or significant systolic murmur, no pericardial rub. Abdomen: Soft, nontender, no hepatomegaly, bowel sounds present, no guarding or rebound. Extremities: No pitting edema, distal pulses 2+. Skin: Warm and dry. Musculoskeletal: No kyphosis. Neuropsychiatric: Alert and oriented x3, affect grossly appropriate.  ECG:  An ECG dated 08/09/2017 was personally reviewed today and demonstrated:  Rate controlled atrial fibrillation with incomplete right branch block, diffuse nonspecific ST-T changes.  Recent Labwork:  March 2021: Hemoglobin 11.4, platelets 154, BUN 20, creatinine 1.9, potassium 4.0, AST 18, ALT 9, cholesterol 118, triglycerides 79, HDL 42, LDL 60, hemoglobin A1c 6.2%  Other Studies Reviewed Today:  Lexiscan Myoview 11/23/2013: IMPRESSION: 1. Large inferolateral wall scar with no peri-infarct ischemia.  2. Decreased left ventricular systolic function with inferolateral wall hypokinesis.  3. Left ventricular ejection fraction 39%  4. High-risk stress test findings based on low ejection  fraction, recommend correlate findings with echocardiogram. There is no current myocardium at jeopardy*.  Echocardiogram 02/10/2017: Study Conclusions  - Left ventricle: The cavity size was normal. Wall thickness was increased in a pattern of moderate LVH. Systolic function was vigorous. The estimated ejection fraction was in the range of 65% to 70%. Although no diagnostic regional wall motion abnormality was identified, this possibility cannot be completely excluded on the basis of this study. The study is not technically sufficient to allow evaluation of LV diastolic function. - Aortic valve: Mildly calcified annulus. Trileaflet; mildly calcified leaflets. - Mitral valve: Mildly thickened leaflets . There was mild regurgitation. - Left atrium: The atrium was moderately dilated. - Atrial septum: No defect or patent foramen ovale was identified. - Tricuspid valve: There was mild regurgitation. Peak RV-RA gradient (S): 30 mm Hg. - Inferior vena cava: Not well visualized. - Pericardium, extracardiac: There was no pericardial effusion.  Impressions:  - Moderate LVH with LVEF 65-70%. Indeterminate diastolic function. Moderate left atrial enlargement. Mildly thickened mitral leaflets with mild mitral regurgitation. Mildly sclerotic aortic valve. Mild tricuspid regurgitation with RV-RA gradient 30 mmHg.  Assessment and Plan:    Medication Adjustments/Labs and Tests Ordered: Current medicines are reviewed at length with the patient today.  Concerns regarding medicines are outlined above.   Tests Ordered: No orders of the defined types were placed in this encounter.   Medication Changes: No orders of the defined types were placed in this encounter.   Disposition:  Follow up {follow up:15908}  Signed, Satira Sark, MD, [facility] 03/01/2020 8:30 AM    [facility] at [place], [place], [place] [address] Phone:  [phone]; Fax: [phone]

## 2020-03-03 NOTE — Progress Notes (Signed)
Cardiology Office Note  Date: 03/04/2020   ID: Donald Gaines, DOB 12-Apr-1940, MRN [id number]  PCP:  Celene Squibb, MD  Cardiologist:  Rozann Lesches, MD Electrophysiologist:  None   Chief Complaint  Patient presents with  . Follow-up    6 mth    History of Present Illness: Donald Gaines is a 79 y.o. male last assessed via telehealth encounter in June.  He presents for a routine visit.  He does not report any progressive angina symptoms since last encounter, has only used sublingual nitroglycerin a few times.  He tends to stay in his home, does not get out very much.  His sister brings him food and other supplies.  He is not anticoagulated for atrial fibrillation with prior history of GI bleeding and AVMs.  I reviewed his current medications which are outlined below.  He does not report any major change in cardiac regimen.  Lab work from earlier in the year showed good LDL control at 60.  I personally reviewed his ECG today which shows rate controlled atrial fibrillation with PVC, diffuse ST-T wave abnormalities.   Past Medical History:  Diagnosis Date  . Arteriovenous malformation small bowel    Capsule study 3/10  . Bilateral carotid artery stenosis   . BPH (benign prostatic hypertrophy)   . Candida esophagitis ([place])   . Cataract   . Chronic anemia   . Chronic diarrhea   . COPD (chronic obstructive pulmonary disease) ([place])   . Coronary atherosclerosis of native coronary artery    Multivessel, BMS SVG TO RCA 2000, reportedly  "2" additional stents in interim  . Dementia ([place])   . Depression   . Diabetes mellitus, type 2 ([place])   . Essential hypertension   . GERD (gastroesophageal reflux disease)   . GI bleeding    Cecal ulcers, arteriovenous malformations  . Glaucoma   . Hemorrhoids   . Hyperlipidemia   . Myocardial infarction ([place])    1994  . Portal hypertensive gastropathy ([place]) 11/08/2012   EGD. Dr. Laural Golden  . Restrictive lung disease   . Sleep apnea    . TIA (transient ischemic attack)    2005    Past Surgical History:  Procedure Laterality Date  . CAPSULE ENDO  03/10  . cardiac catherization  04/2012  . CARDIAC CATHETERIZATION  04/05/1996 [name]   EF 45%, occluded SVG to circumflex, patent SVG to D1 and PDA and patent LIMA to LAD with severe native vessel disease.  Marland Kitchen CARDIAC CATHETERIZATION  08/22/1998 [name]   Mild LM, severe LAD,severe CX, occlude RCA, patient  SVG to diatal RCA, patent vein graft to D1 and patent LIMA to LAD.   Marland Kitchen CATARACT EXTRACTION W/PHACO Right 09/22/2012   Procedure: CATARACT EXTRACTION PHACO AND INTRAOCULAR LENS PLACEMENT (IOC);  Surgeon: Tonny Branch, MD;  Location: [facility];  Service: Ophthalmology;  Laterality: Right;  CDE: 11.43  . CATARACT EXTRACTION W/PHACO Left 10/17/2012   Procedure: CATARACT EXTRACTION PHACO AND INTRAOCULAR LENS PLACEMENT (IOC);  Surgeon: Tonny Branch, MD;  Location: [facility];  Service: Ophthalmology;  Laterality: Left;  CDE: 8.06  . COLONOSCOPY  APR 2008   SIMPLE ADENOMA, [place] TICS, IH  . COLONOSCOPY  FEB 2008 ANEMIA. MELENA    2 LARGE ILEOCEAL ULCERS 2o to ASA, [place] TICS, IH  . COLONOSCOPY  SEP 2009 TRANSFUSION DEP ANEMIA   AC AVM-ABLATED, [place] TICS, IH  . COLONOSCOPY WITH ESOPHAGOGASTRODUODENOSCOPY (EGD) N/A 11/08/2012   Procedure: COLONOSCOPY WITH ESOPHAGOGASTRODUODENOSCOPY (EGD);  Surgeon: Rogene Houston, MD;  [facility];  Service: Endoscopy;  Laterality: N/A;  250-rescheduled to 7:30 Ann to notify pt  . CORONARY ANGIOPLASTY WITH STENT PLACEMENT  08/22/1998 [name]   TEC stenting of the SVG to RCA-Last seen by cardiologist in 2010.  Marland Kitchen CORONARY ARTERY BYPASS GRAFT  1995-triple bypass   LIMA-LAD, LIMA to intermediate branch, SVG to PD and second OM,  . ESOPHAGOGASTRODUODENOSCOPY  SEP 09   DUODENAL LIPOMA  . ESOPHAGOGASTRODUODENOSCOPY N/A 09/27/2013   Procedure: ESOPHAGOGASTRODUODENOSCOPY (EGD) Push enteroscopy;  Surgeon: Rogene Houston, MD;  [facility];   Service: Endoscopy;  Laterality: N/A;  . GIVENS CAPSULE STUDY N/A 11/24/2012   Procedure: GIVENS CAPSULE STUDY;  Surgeon: Rogene Houston, MD;  [facility]: [facility];  Service: Endoscopy;  Laterality: N/A;  730  . HOT HEMOSTASIS  09/27/2013   Procedure: HOT HEMOSTASIS (ARGON PLASMA COAGULATION/BICAP);  Surgeon: Rogene Houston, MD;  [facility];  Service: Endoscopy;;  . HYDROGEN BREATH TEST  2009  . RIGHT INGUINAL HERNIA REPAIR    . UPPER GASTROINTESTINAL ENDOSCOPY  SEP 2009   GASTRIC AVM ABLATED, NL DUO Bx    Current Outpatient Medications  Medication Sig Dispense Refill  . acetaminophen (TYLENOL) 500 MG tablet Take 500 mg by mouth every 6 (six) hours as needed for headache.    . albuterol (PROVENTIL) (2.5 MG/3ML) 0.083% nebulizer solution Take 3 mLs (2.5 mg total) by nebulization every 2 (two) hours as needed for wheezing or shortness of breath. 75 mL 12  . ALPRAZolam (XANAX) 0.5 MG tablet Take 0.5 mg by mouth 2 (two) times daily as needed for anxiety.    . carvedilol (COREG) 12.5 MG tablet Take 12.5 mg by mouth 2 (two) times daily with a meal.     . cetirizine (ZYRTEC) 10 MG tablet Take 10 mg by mouth at bedtime.     . clopidogrel (PLAVIX) 75 MG tablet Take 1 tablet (75 mg total) by mouth every morning.    . escitalopram (LEXAPRO) 5 MG tablet Take 5 mg by mouth daily.     . ferrous sulfate 325 (65 FE) MG tablet Take 325 mg by mouth daily with breakfast.    . finasteride (PROSCAR) 5 MG tablet Take 5 mg by mouth daily.    . furosemide (LASIX) 40 MG tablet Take 40 mg by mouth daily.    Marland Kitchen gabapentin (NEURONTIN) 100 MG capsule Take 100 mg by mouth at bedtime.     . isosorbide mononitrate (IMDUR) 60 MG 24 hr tablet Take 1 tablet (60 mg total) by mouth daily. 30 tablet 11  . lisinopril (PRINIVIL,ZESTRIL) 2.5 MG tablet Take 2.5 mg by mouth daily.    Marland Kitchen NITROSTAT 0.4 MG SL tablet Place 0.4 mg under the tongue every 5 (five) minutes as needed for chest pain.     Marland Kitchen omeprazole (PRILOSEC)  20 MG capsule Take 20 mg by mouth daily.     . rosuvastatin (CRESTOR) 20 MG tablet Take 1 tablet (20 mg total) by mouth daily. 90 tablet 0  . umeclidinium-vilanterol (ANORO ELLIPTA) 62.5-25 MCG/INH AEPB Inhale 1 puff into the lungs daily.     No current facility-administered medications for this visit.   Allergies:  Penicillins   ROS: Intermittent sense of palpitations.  Physical Exam: VS:  BP 118/68   Pulse 82   Resp 16   Ht 5' 5.5" (1.664 m)   Wt 182 lb 12.8 oz (82.9 kg)   SpO2 99%  BMI 29.96 kg/m , BMI Body mass index is 29.96 kg/m.  Wt Readings from Last 3 Encounters:  03/04/20 182 lb 12.8 oz (82.9 kg)  10/26/19 182 lb 15.7 oz (83 kg)  10/24/19 183 lb 11.2 oz (83.3 kg)    General: Patient appears comfortable at rest. HEENT: Conjunctiva and lids normal, wearing a mask. Neck: Supple, no elevated JVP or carotid bruits, no thyromegaly. Lungs: Clear to auscultation, nonlabored breathing at rest. Cardiac: Regularly irregular, soft systolic murmur, no gallop. Extremities: No pitting edema.  ECG:  An ECG dated 08/09/2017 was personally reviewed today and demonstrated:  Rate controlled atrial fibrillation, incomplete right bundle branch block, diffuse nonspecific ST-T changes.  Recent Labwork:  March 2021: Hemoglobin 11.4, platelets 154, BUN 20, creatinine 1.9, potassium 4.0, AST 18, ALT 8, cholesterol 118, triglycerides 79, HDL 42, LDL 60, hemoglobin A1c 6.2%  Other Studies Reviewed Today:  Lexiscan Myoview 11/23/2013: IMPRESSION: 1. Large inferolateral wall scar with no peri-infarct ischemia.  2. Decreased left ventricular systolic function with inferolateral wall hypokinesis.  3. Left ventricular ejection fraction 39%  4. High-risk stress test findings based on low ejection fraction, recommend correlate findings with echocardiogram. There is no current myocardium at jeopardy*.  Echocardiogram 02/10/2017: Study Conclusions  - Left ventricle: The cavity size  was normal. Wall thickness was increased in a pattern of moderate LVH. Systolic function was vigorous. The estimated ejection fraction was in the range of 65% to 70%. Although no diagnostic regional wall motion abnormality was identified, this possibility cannot be completely excluded on the basis of this study. The study is not technically sufficient to allow evaluation of LV diastolic function. - Aortic valve: Mildly calcified annulus. Trileaflet; mildly calcified leaflets. - Mitral valve: Mildly thickened leaflets . There was mild regurgitation. - Left atrium: The atrium was moderately dilated. - Atrial septum: No defect or patent foramen ovale was identified. - Tricuspid valve: There was mild regurgitation. Peak RV-RA gradient (S): 30 mm Hg. - Inferior vena cava: Not well visualized. - Pericardium, extracardiac: There was no pericardial effusion.  Impressions:  - Moderate LVH with LVEF 65-70%. Indeterminate diastolic function. Moderate left atrial enlargement. Mildly thickened mitral leaflets with mild mitral regurgitation. Mildly sclerotic aortic valve. Mild tricuspid regurgitation with RV-RA gradient 30 mmHg.  Assessment and Plan:  1.  Permanent atrial fibrillation.  CHA2DS2-VASc score is 6 but he is not anticoagulated with previous history of recurrent GI bleeding and AVMs.  Heart rate well controlled on Coreg.  ECG reviewed.  2.  CAD status post CABG and PCI over time.  Reports stable angina symptoms, continue with observation on medical therapy.  Current regimen includes Coreg, Plavix, Imdur, lisinopril, and Crestor.  3.  Mixed hyperlipidemia on Crestor.  Last LDL 60.  Medication Adjustments/Labs and Tests Ordered: Current medicines are reviewed at length with the patient today.  Concerns regarding medicines are outlined above.   Tests Ordered: No orders of the defined types were placed in this encounter.   Medication Changes: No orders of  the defined types were placed in this encounter.   Disposition:  Follow up 6 months in the [place] office.  Signed, Satira Sark, MD, [facility] 03/04/2020 10:34 AM    [facility] [place] at [place], [place], [place] [address] Phone: [phone]; Fax: [phone]

## 2020-03-04 ENCOUNTER — Other Ambulatory Visit: Payer: Self-pay

## 2020-03-04 ENCOUNTER — Encounter: Payer: Self-pay | Admitting: Cardiology

## 2020-03-04 ENCOUNTER — Ambulatory Visit (INDEPENDENT_AMBULATORY_CARE_PROVIDER_SITE_OTHER): Payer: Medicare HMO | Admitting: Cardiology

## 2020-03-04 VITALS — BP 118/68 | HR 82 | Resp 16 | Ht 65.5 in | Wt 182.8 lb

## 2020-03-04 DIAGNOSIS — I4821 Permanent atrial fibrillation: Secondary | ICD-10-CM | POA: Diagnosis not present

## 2020-03-04 DIAGNOSIS — I25119 Atherosclerotic heart disease of native coronary artery with unspecified angina pectoris: Secondary | ICD-10-CM

## 2020-03-04 DIAGNOSIS — E782 Mixed hyperlipidemia: Secondary | ICD-10-CM

## 2020-03-04 NOTE — Addendum Note (Signed)
Addended by: Merlene Laughter on: 03/04/2020 04:16 PM   Modules accepted: Orders

## 2020-03-04 NOTE — Patient Instructions (Addendum)

## 2020-06-03 ENCOUNTER — Other Ambulatory Visit: Payer: Self-pay

## 2020-06-03 ENCOUNTER — Inpatient Hospital Stay (HOSPITAL_COMMUNITY)
Admission: EM | Admit: 2020-06-03 | Discharge: 2020-06-05 | DRG: 291 | Disposition: A | Discharge status: Home / Self Care | Payer: Medicare HMO | Attending: Family Medicine | Admitting: Family Medicine

## 2020-06-03 ENCOUNTER — Encounter (HOSPITAL_COMMUNITY): Payer: Self-pay

## 2020-06-03 ENCOUNTER — Emergency Department (HOSPITAL_COMMUNITY): Payer: Medicare HMO

## 2020-06-03 ENCOUNTER — Inpatient Hospital Stay (HOSPITAL_COMMUNITY): Payer: Medicare HMO

## 2020-06-03 DIAGNOSIS — J449 Chronic obstructive pulmonary disease, unspecified: Secondary | ICD-10-CM | POA: Diagnosis not present

## 2020-06-03 DIAGNOSIS — J810 Acute pulmonary edema: Secondary | ICD-10-CM

## 2020-06-03 DIAGNOSIS — R7989 Other specified abnormal findings of blood chemistry: Secondary | ICD-10-CM

## 2020-06-03 DIAGNOSIS — E782 Mixed hyperlipidemia: Secondary | ICD-10-CM | POA: Diagnosis not present

## 2020-06-03 DIAGNOSIS — E876 Hypokalemia: Secondary | ICD-10-CM | POA: Diagnosis not present

## 2020-06-03 DIAGNOSIS — Z20822 Contact with and (suspected) exposure to covid-19: Secondary | ICD-10-CM | POA: Diagnosis present

## 2020-06-03 DIAGNOSIS — I5031 Acute diastolic (congestive) heart failure: Secondary | ICD-10-CM | POA: Diagnosis present

## 2020-06-03 DIAGNOSIS — R9431 Abnormal electrocardiogram [ECG] [EKG]: Secondary | ICD-10-CM

## 2020-06-03 DIAGNOSIS — I10 Essential (primary) hypertension: Secondary | ICD-10-CM | POA: Diagnosis present

## 2020-06-03 DIAGNOSIS — J90 Pleural effusion, not elsewhere classified: Secondary | ICD-10-CM | POA: Diagnosis not present

## 2020-06-03 DIAGNOSIS — Z7902 Long term (current) use of antithrombotics/antiplatelets: Secondary | ICD-10-CM

## 2020-06-03 DIAGNOSIS — I5021 Acute systolic (congestive) heart failure: Secondary | ICD-10-CM

## 2020-06-03 DIAGNOSIS — E1122 Type 2 diabetes mellitus with diabetic chronic kidney disease: Secondary | ICD-10-CM | POA: Diagnosis present

## 2020-06-03 DIAGNOSIS — I509 Heart failure, unspecified: Secondary | ICD-10-CM | POA: Diagnosis not present

## 2020-06-03 DIAGNOSIS — I2510 Atherosclerotic heart disease of native coronary artery without angina pectoris: Secondary | ICD-10-CM | POA: Diagnosis not present

## 2020-06-03 DIAGNOSIS — J9621 Acute and chronic respiratory failure with hypoxia: Secondary | ICD-10-CM | POA: Diagnosis present

## 2020-06-03 DIAGNOSIS — R0689 Other abnormalities of breathing: Secondary | ICD-10-CM | POA: Diagnosis not present

## 2020-06-03 DIAGNOSIS — Z880 Allergy status to penicillin: Secondary | ICD-10-CM

## 2020-06-03 DIAGNOSIS — R7303 Prediabetes: Secondary | ICD-10-CM | POA: Diagnosis not present

## 2020-06-03 DIAGNOSIS — I4819 Other persistent atrial fibrillation: Secondary | ICD-10-CM | POA: Diagnosis not present

## 2020-06-03 DIAGNOSIS — Z8249 Family history of ischemic heart disease and other diseases of the circulatory system: Secondary | ICD-10-CM

## 2020-06-03 DIAGNOSIS — F17200 Nicotine dependence, unspecified, uncomplicated: Secondary | ICD-10-CM | POA: Diagnosis present

## 2020-06-03 DIAGNOSIS — I5030 Unspecified diastolic (congestive) heart failure: Secondary | ICD-10-CM | POA: Diagnosis not present

## 2020-06-03 DIAGNOSIS — F17210 Nicotine dependence, cigarettes, uncomplicated: Secondary | ICD-10-CM | POA: Diagnosis present

## 2020-06-03 DIAGNOSIS — R0602 Shortness of breath: Secondary | ICD-10-CM | POA: Diagnosis not present

## 2020-06-03 DIAGNOSIS — K649 Unspecified hemorrhoids: Secondary | ICD-10-CM | POA: Diagnosis present

## 2020-06-03 DIAGNOSIS — K219 Gastro-esophageal reflux disease without esophagitis: Secondary | ICD-10-CM | POA: Diagnosis present

## 2020-06-03 DIAGNOSIS — Z79899 Other long term (current) drug therapy: Secondary | ICD-10-CM

## 2020-06-03 DIAGNOSIS — N183 Chronic kidney disease, stage 3 unspecified: Secondary | ICD-10-CM | POA: Diagnosis present

## 2020-06-03 DIAGNOSIS — I130 Hypertensive heart and chronic kidney disease with heart failure and stage 1 through stage 4 chronic kidney disease, or unspecified chronic kidney disease: Principal | ICD-10-CM | POA: Diagnosis present

## 2020-06-03 DIAGNOSIS — D539 Nutritional anemia, unspecified: Secondary | ICD-10-CM | POA: Diagnosis not present

## 2020-06-03 DIAGNOSIS — N1832 Chronic kidney disease, stage 3b: Secondary | ICD-10-CM | POA: Diagnosis present

## 2020-06-03 DIAGNOSIS — Z9981 Dependence on supplemental oxygen: Secondary | ICD-10-CM | POA: Diagnosis not present

## 2020-06-03 DIAGNOSIS — Z743 Need for continuous supervision: Secondary | ICD-10-CM | POA: Diagnosis not present

## 2020-06-03 DIAGNOSIS — I4891 Unspecified atrial fibrillation: Secondary | ICD-10-CM | POA: Diagnosis not present

## 2020-06-03 DIAGNOSIS — I110 Hypertensive heart disease with heart failure: Secondary | ICD-10-CM | POA: Diagnosis not present

## 2020-06-03 DIAGNOSIS — R059 Cough, unspecified: Secondary | ICD-10-CM | POA: Diagnosis not present

## 2020-06-03 LAB — ECHOCARDIOGRAM COMPLETE
Area-P 1/2: 2.36 cm2
Height: 67 in
S' Lateral: 2.62 cm
Weight: 3037.06 oz

## 2020-06-03 LAB — RESP PANEL BY RT-PCR (FLU A&B, COVID) ARPGX2
Influenza A by PCR: NEGATIVE
Influenza B by PCR: NEGATIVE
SARS Coronavirus 2 by RT PCR: NEGATIVE

## 2020-06-03 LAB — CBC WITH DIFFERENTIAL/PLATELET
Abs Immature Granulocytes: 0.02 10*3/uL (ref 0.00–0.07)
Basophils Absolute: 0 10*3/uL (ref 0.0–0.1)
Basophils Relative: 1 %
Eosinophils Absolute: 0.1 10*3/uL (ref 0.0–0.5)
Eosinophils Relative: 2 %
HCT: 39.1 % (ref 39.0–52.0)
Hemoglobin: 11.6 g/dL — ABNORMAL LOW (ref 13.0–17.0)
Immature Granulocytes: 0 %
Lymphocytes Relative: 12 %
Lymphs Abs: 0.7 10*3/uL (ref 0.7–4.0)
MCH: 32 pg (ref 26.0–34.0)
MCHC: 29.7 g/dL — ABNORMAL LOW (ref 30.0–36.0)
MCV: 107.7 fL — ABNORMAL HIGH (ref 80.0–100.0)
Monocytes Absolute: 0.6 10*3/uL (ref 0.1–1.0)
Monocytes Relative: 10 %
Neutro Abs: 4.5 10*3/uL (ref 1.7–7.7)
Neutrophils Relative %: 75 %
Platelets: 179 10*3/uL (ref 150–400)
RBC: 3.63 MIL/uL — ABNORMAL LOW (ref 4.22–5.81)
RDW: 15.4 % (ref 11.5–15.5)
WBC: 6 10*3/uL (ref 4.0–10.5)
nRBC: 0 % (ref 0.0–0.2)

## 2020-06-03 LAB — BASIC METABOLIC PANEL
Anion gap: 9 (ref 5–15)
BUN: 13 mg/dL (ref 8–23)
CO2: 29 mmol/L (ref 22–32)
Calcium: 8.3 mg/dL — ABNORMAL LOW (ref 8.9–10.3)
Chloride: 105 mmol/L (ref 98–111)
Creatinine, Ser: 1.78 mg/dL — ABNORMAL HIGH (ref 0.61–1.24)
GFR, Estimated: 39 mL/min — ABNORMAL LOW (ref >60)
Glucose, Bld: 124 mg/dL — ABNORMAL HIGH (ref 70–99)
Potassium: 2.9 mmol/L — ABNORMAL LOW (ref 3.5–5.1)
Sodium: 143 mmol/L (ref 135–145)

## 2020-06-03 LAB — VITAMIN B12: Vitamin B-12: 279 pg/mL (ref 180–914)

## 2020-06-03 LAB — HEMOGLOBIN A1C
Hgb A1c MFr Bld: 6.1 % — ABNORMAL HIGH (ref 4.8–5.6)
Mean Plasma Glucose: 128.37 mg/dL

## 2020-06-03 LAB — BRAIN NATRIURETIC PEPTIDE: B Natriuretic Peptide: 439 pg/mL — ABNORMAL HIGH (ref 0.0–100.0)

## 2020-06-03 LAB — FOLATE: Folate: 6.9 ng/mL (ref >5.9)

## 2020-06-03 LAB — PHOSPHORUS: Phosphorus: 3.3 mg/dL (ref 2.5–4.6)

## 2020-06-03 LAB — MAGNESIUM: Magnesium: 2.4 mg/dL (ref 1.7–2.4)

## 2020-06-03 MED ORDER — ALBUTEROL SULFATE (2.5 MG/3ML) 0.083% IN NEBU: 2.5000 mg | INHALATION_SOLUTION | RESPIRATORY_TRACT | Status: DC | PRN

## 2020-06-03 MED ORDER — CLOPIDOGREL BISULFATE 75 MG PO TABS
75.0000 mg | ORAL_TABLET | Freq: Every morning | ORAL | Status: DC
  Administered 2020-06-03 – 2020-06-05 (×3): 75 mg via ORAL
  Filled 2020-06-03 (×4): qty 1

## 2020-06-03 MED ORDER — POTASSIUM CHLORIDE 10 MEQ/100ML IV SOLN
10.0000 meq | INTRAVENOUS | Status: AC
  Administered 2020-06-03 (×2): 10 meq via INTRAVENOUS
  Filled 2020-06-03 (×2): qty 100

## 2020-06-03 MED ORDER — LISINOPRIL 5 MG PO TABS: 2.5000 mg | ORAL_TABLET | Freq: Every day | ORAL | Status: DC

## 2020-06-03 MED ORDER — PERFLUTREN LIPID MICROSPHERE
1.0000 mL | INTRAVENOUS | Status: AC | PRN
  Administered 2020-06-03: 1 mL via INTRAVENOUS
  Filled 2020-06-03: qty 10

## 2020-06-03 MED ORDER — LORATADINE 10 MG PO TABS
10.0000 mg | ORAL_TABLET | Freq: Every day | ORAL | Status: DC
  Administered 2020-06-03 – 2020-06-05 (×3): 10 mg via ORAL
  Filled 2020-06-03 (×3): qty 1

## 2020-06-03 MED ORDER — UMECLIDINIUM-VILANTEROL 62.5-25 MCG/INH IN AEPB
1.0000 | INHALATION_SPRAY | Freq: Every day | RESPIRATORY_TRACT | Status: DC
  Administered 2020-06-03 – 2020-06-05 (×3): 1 via RESPIRATORY_TRACT
  Filled 2020-06-03 (×2): qty 14

## 2020-06-03 MED ORDER — PANTOPRAZOLE SODIUM 40 MG PO TBEC
40.0000 mg | DELAYED_RELEASE_TABLET | Freq: Every day | ORAL | Status: DC
  Administered 2020-06-03 – 2020-06-05 (×3): 40 mg via ORAL
  Filled 2020-06-03 (×3): qty 1

## 2020-06-03 MED ORDER — ENOXAPARIN SODIUM 40 MG/0.4ML ~~LOC~~ SOLN
40.0000 mg | SUBCUTANEOUS | Status: DC
  Administered 2020-06-03 – 2020-06-05 (×3): 40 mg via SUBCUTANEOUS
  Filled 2020-06-03 (×3): qty 0.4

## 2020-06-03 MED ORDER — CARVEDILOL 12.5 MG PO TABS
12.5000 mg | ORAL_TABLET | Freq: Two times a day (BID) | ORAL | Status: DC
  Administered 2020-06-03 – 2020-06-04 (×4): 12.5 mg via ORAL
  Filled 2020-06-03 (×6): qty 1

## 2020-06-03 MED ORDER — FINASTERIDE 5 MG PO TABS
5.0000 mg | ORAL_TABLET | Freq: Every day | ORAL | Status: DC
  Administered 2020-06-03 – 2020-06-05 (×3): 5 mg via ORAL
  Filled 2020-06-03 (×3): qty 1

## 2020-06-03 MED ORDER — ALPRAZOLAM 0.5 MG PO TABS: 0.5000 mg | ORAL_TABLET | Freq: Two times a day (BID) | ORAL | Status: DC | PRN

## 2020-06-03 MED ORDER — NITROGLYCERIN 0.4 MG SL SUBL: 0.4000 mg | SUBLINGUAL_TABLET | SUBLINGUAL | Status: DC | PRN

## 2020-06-03 MED ORDER — POTASSIUM CHLORIDE CRYS ER 20 MEQ PO TBCR
40.0000 meq | EXTENDED_RELEASE_TABLET | Freq: Once | ORAL | Status: AC
  Administered 2020-06-03: 40 meq via ORAL
  Filled 2020-06-03: qty 2

## 2020-06-03 MED ORDER — FUROSEMIDE 10 MG/ML IJ SOLN
40.0000 mg | Freq: Two times a day (BID) | INTRAMUSCULAR | Status: DC
  Administered 2020-06-03 – 2020-06-04 (×4): 40 mg via INTRAVENOUS
  Filled 2020-06-03 (×5): qty 4

## 2020-06-03 MED ORDER — NITROGLYCERIN 2 % TD OINT
1.0000 [in_us] | TOPICAL_OINTMENT | Freq: Once | TRANSDERMAL | Status: AC
  Administered 2020-06-03: 1 [in_us] via TOPICAL
  Filled 2020-06-03: qty 1

## 2020-06-03 MED ORDER — GABAPENTIN 100 MG PO CAPS
100.0000 mg | ORAL_CAPSULE | Freq: Every day | ORAL | Status: DC
  Administered 2020-06-03 – 2020-06-04 (×2): 100 mg via ORAL
  Filled 2020-06-03 (×2): qty 1

## 2020-06-03 MED ORDER — FUROSEMIDE 10 MG/ML IJ SOLN
80.0000 mg | Freq: Once | INTRAMUSCULAR | Status: AC
  Administered 2020-06-03: 80 mg via INTRAVENOUS
  Filled 2020-06-03: qty 8

## 2020-06-03 MED ORDER — ROSUVASTATIN CALCIUM 20 MG PO TABS
20.0000 mg | ORAL_TABLET | Freq: Every day | ORAL | Status: DC
  Administered 2020-06-03 – 2020-06-05 (×3): 20 mg via ORAL
  Filled 2020-06-03 (×3): qty 1

## 2020-06-03 MED ORDER — POTASSIUM CHLORIDE CRYS ER 20 MEQ PO TBCR
40.0000 meq | EXTENDED_RELEASE_TABLET | Freq: Every day | ORAL | Status: DC
  Administered 2020-06-03 – 2020-06-05 (×3): 40 meq via ORAL
  Filled 2020-06-03 (×3): qty 2

## 2020-06-03 MED ORDER — NICOTINE 7 MG/24HR TD PT24
7.0000 mg | MEDICATED_PATCH | Freq: Every day | TRANSDERMAL | Status: DC
  Administered 2020-06-03 – 2020-06-05 (×3): 7 mg via TRANSDERMAL
  Filled 2020-06-03 (×3): qty 1

## 2020-06-03 NOTE — Progress Notes (Signed)
Patient seen and evaluated this morning at bedside.  He is feeling better with ongoing diuresis.  Briefly:  Donald Gaines is a 79 y.o. male with medical history significant for COPD, CAD, prediabetes, hypertension, atrial fibrillation and tobacco abuse who presents to the ED via EMS due to shortness of breath.   Patient has been admitted with acute on chronic hypoxemic respiratory failure secondary to new onset CHF and has been started on diuresis with IV Lasix.  2D echocardiogram ordered and pending.  It appears that he has not been taking his home Lasix as prescribed and usually only takes it once a day when he wakes up as opposed to twice a day.  Noted to have hypokalemia today which will be repleted.  Monitor a.m. labs.  Anticipate discharge in 1-2 days.  Total care time: 30 minutes.

## 2020-06-03 NOTE — Plan of Care (Signed)
?  Problem: Education: ?Goal: Ability to demonstrate management of disease process will improve ?Outcome: Progressing ?  ?

## 2020-06-03 NOTE — ED Triage Notes (Addendum)
Pt arrived [facility], c/o sob for 2 weeks, swollen legs, and stomach distention. Pt states he has had decreased mobility as well.   Pt o2 88% on EMS arrival  Pt now 92 on 2L [place]  Pt states he is usually on 2L [place] @ home, but it ran out

## 2020-06-03 NOTE — ED Provider Notes (Signed)
[facility] EMERGENCY DEPARTMENT Provider Note   CSN: [id number] Arrival date & time: 06/03/20  0216     History Chief Complaint  Patient presents with  . Shortness of Breath    Donald Gaines is a 80 y.o. male.  The history is provided by the patient.  Shortness of Breath Severity:  Moderate Onset quality:  Gradual Duration:  2 weeks Timing:  Intermittent Progression:  Worsening Chronicity:  New Relieved by:  Rest Worsened by:  Activity Associated symptoms: cough   Associated symptoms: no chest pain, no fever and no vomiting       Patient with extensive history including COPD, CAD, diabetes, hypertension, atrial fibrillation presents with shortness of breath.  Patient reports over the past 2 weeks he has had increasing shortness of breath, worse with exertion.  He reports very little activity at baseline.  No fevers or vomiting.  No chest pain.  He reports minimal cough.  He uses oxygen as needed. He is a current smoker.  He reports significant swelling in his abdomen and legs. Past Medical History:  Diagnosis Date  . Arteriovenous malformation small bowel    Capsule study 3/10  . Bilateral carotid artery stenosis   . BPH (benign prostatic hypertrophy)   . Candida esophagitis ([place])   . Cataract   . Chronic anemia   . Chronic diarrhea   . COPD (chronic obstructive pulmonary disease) ([place])   . Coronary atherosclerosis of native coronary artery    Multivessel, BMS SVG TO RCA 2000, reportedly  "2" additional stents in interim  . Dementia ([place])   . Depression   . Diabetes mellitus, type 2 ([place])   . Essential hypertension   . GERD (gastroesophageal reflux disease)   . GI bleeding    Cecal ulcers, arteriovenous malformations  . Glaucoma   . Hemorrhoids   . Hyperlipidemia   . Myocardial infarction ([place])    1994  . Portal hypertensive gastropathy ([place]) 11/08/2012   EGD. Dr. Laural Golden  . Restrictive lung disease   . Sleep apnea   . TIA (transient ischemic attack)     2005    Patient Active Problem List   Diagnosis Date Noted  . Gram-positive bacteremia 07/29/2015  . ARF (acute renal failure) ([place]) 07/27/2015  . CKD (chronic kidney disease) stage 3, GFR 30-59 ml/min (HCC) 07/27/2015  . Leukocytosis 07/27/2015  . COPD exacerbation ([place]) 07/27/2015  . Hypokalemia 12/26/2014  . Persistent atrial fibrillation ([place]) 11/22/2013  . COPD (chronic obstructive pulmonary disease) ([place]) 09/24/2013  . Coronary atherosclerosis of native coronary artery 11/28/2012  . Community acquired pneumonia 11/15/2012  . Chronic GI bleeding 11/15/2012  . Atypical chest pain 11/15/2012  . Essential hypertension, benign 07/28/2010  . Mixed hyperlipidemia 07/28/2010  . IBS 11/20/2009  . ARTERIOVENOUS MALFORMATION 09/20/2008  . COLONIC POLYPS, ADENOMATOUS 09/13/2008  . Type 2 diabetes mellitus ([place]) 09/13/2008  . TOBACCO ABUSE 09/13/2008  . HEMORRHOIDS, INTERNAL 09/13/2008  . EOSINOPHILIC ESOPHAGITIS 66/59/9357  . FATTY LIVER DISEASE 09/13/2008  . Iron deficiency anemia due to chronic blood loss 05/25/2008  . GERD 05/25/2008  . CONSTIPATION 05/25/2008  . DIARRHEA 05/25/2008    Past Surgical History:  Procedure Laterality Date  . CAPSULE ENDO  03/10  . cardiac catherization  04/2012  . CARDIAC CATHETERIZATION  04/05/1996 [name]   EF 45%, occluded SVG to circumflex, patent SVG to D1 and PDA and patent LIMA to LAD with severe native vessel disease.  Marland Kitchen CARDIAC CATHETERIZATION  08/22/1998 [name]   Mild  LM, severe LAD,severe CX, occlude RCA, patient  SVG to diatal RCA, patent vein graft to D1 and patent LIMA to LAD.   Marland Kitchen CATARACT EXTRACTION W/PHACO Right 09/22/2012   Procedure: CATARACT EXTRACTION PHACO AND INTRAOCULAR LENS PLACEMENT (IOC);  Surgeon: Tonny Branch, MD;  Location: [facility] ORS;  Service: Ophthalmology;  Laterality: Right;  CDE: 11.43  . CATARACT EXTRACTION W/PHACO Left 10/17/2012   Procedure: CATARACT EXTRACTION PHACO AND INTRAOCULAR LENS PLACEMENT (IOC);   Surgeon: Tonny Branch, MD;  Location: [facility] ORS;  Service: Ophthalmology;  Laterality: Left;  CDE: 8.06  . COLONOSCOPY  APR 2008   SIMPLE ADENOMA, [place] TICS, IH  . COLONOSCOPY  FEB 2008 ANEMIA. MELENA    2 LARGE ILEOCEAL ULCERS 2o to ASA, [place] TICS, IH  . COLONOSCOPY  SEP 2009 TRANSFUSION DEP ANEMIA   AC AVM-ABLATED, [place] TICS, IH  . COLONOSCOPY WITH ESOPHAGOGASTRODUODENOSCOPY (EGD) N/A 11/08/2012   Procedure: COLONOSCOPY WITH ESOPHAGOGASTRODUODENOSCOPY (EGD);  Surgeon: Rogene Houston, MD;  Location: [facility] ENDO SUITE;  Service: Endoscopy;  Laterality: N/A;  250-rescheduled to 7:30 Ann to notify pt  . CORONARY ANGIOPLASTY WITH STENT PLACEMENT  08/22/1998 [name]   TEC stenting of the SVG to RCA-Last seen by cardiologist in 2010.  Marland Kitchen CORONARY ARTERY BYPASS GRAFT  1995-triple bypass   LIMA-LAD, LIMA to intermediate branch, SVG to PD and second OM,  . ESOPHAGOGASTRODUODENOSCOPY  SEP 09   DUODENAL LIPOMA  . ESOPHAGOGASTRODUODENOSCOPY N/A 09/27/2013   Procedure: ESOPHAGOGASTRODUODENOSCOPY (EGD) Push enteroscopy;  Surgeon: Rogene Houston, MD;  Location: [facility] END[facility] SU[facility];  Service: Endoscopy;  Laterality: N/A;  . GIVENS CAPSULE STUDY N/A 11/24/2012   Procedure: GIVENS CAPSULE STUDY;  Surgeon: Rogene Houston, MD;  Location: [facility] SU[facility];  Service: Endoscopy;  Laterality: N/A;  730  . HOT HEMOSTASIS  09/27/2013   Procedure: HOT HEMOSTASIS (ARGON PLASMA COAGULATION/BICAP);  Surgeon: Rogene Houston, MD;  Location: [facility] SUITE;  Service: Endoscopy;;  . HYDROGEN BREATH TEST  2009  . RIGHT INGUINAL HERNIA REPAIR    . UPPER GASTROINTESTINAL ENDOSCOPY  SEP 2009   GASTRIC AVM ABLATED, NL DUO Bx       Family History  Problem Relation Age of Onset  . Heart attack Mother   . Heart disease Mother   . Hypertension Mother     Social History   Tobacco Use  . Smoking status: Current Every Day Smoker    Packs/day: 1.00    Years: 60.00    Pack years: 60.00    Types: Cigarettes    Start date: 01/02/1949  .  Smokeless tobacco: Never Used  . Tobacco comment: 1 pack a day  Vaping Use  . Vaping Use: Never used  Substance Use Topics  . Alcohol use: No    Alcohol/week: 0.0 standard drinks    Comment: No etoh in 12 yrs.  . Drug use: Yes    Types: Marijuana    Comment: last use 10/25/19    Home Medications Prior to Admission medications   Medication Sig Start Date End Date Taking? Authorizing Provider  acetaminophen (TYLENOL) 500 MG tablet Take 500 mg by mouth every 6 (six) hours as needed for headache.    [provider]  albuterol (PROVENTIL) (2.5 MG/3ML) 0.083% nebulizer solution Take 3 mLs (2.5 mg total) by nebulization every 2 (two) hours as needed for wheezing or shortness of breath. 08/02/15   Orvan Falconer, MD  ALPRAZolam Duanne Moron) 0.5 MG tablet Take 0.5 mg by mouth 2 (two) times daily as needed  for anxiety.    [provider]  carvedilol (COREG) 12.5 MG tablet Take 12.5 mg by mouth 2 (two) times daily with a meal.  03/26/14   [provider]  cetirizine (ZYRTEC) 10 MG tablet Take 10 mg by mouth at bedtime.     [provider]  clopidogrel (PLAVIX) 75 MG tablet Take 1 tablet (75 mg total) by mouth every morning. 09/27/13   Nita Sells, MD  escitalopram (LEXAPRO) 5 MG tablet Take 5 mg by mouth daily.  04/16/15   [provider]  ferrous sulfate 325 (65 FE) MG tablet Take 325 mg by mouth daily with breakfast.    [provider]  finasteride (PROSCAR) 5 MG tablet Take 5 mg by mouth daily.    [provider]  furosemide (LASIX) 40 MG tablet Take 40 mg by mouth daily.    [provider]  gabapentin (NEURONTIN) 100 MG capsule Take 100 mg by mouth at bedtime.  09/20/14   [provider]  isosorbide mononitrate (IMDUR) 60 MG 24 hr tablet Take 1 tablet (60 mg total) by mouth daily. 06/01/13   Lendon Colonel, NP  lisinopril (PRINIVIL,ZESTRIL) 2.5 MG tablet Take 2.5 mg by mouth daily.    [provider]  NITROSTAT  0.4 MG SL tablet Place 0.4 mg under the tongue every 5 (five) minutes as needed for chest pain.  05/23/10   [provider]  omeprazole (PRILOSEC) 20 MG capsule Take 20 mg by mouth daily.  08/28/13   [provider]  rosuvastatin (CRESTOR) 20 MG tablet Take 1 tablet (20 mg total) by mouth daily. 12/12/13   Satira Sark, MD  umeclidinium-vilanterol (ANORO ELLIPTA) 62.5-25 MCG/INH AEPB Inhale 1 puff into the lungs daily.    [provider]    Allergies    Penicillins  Review of Systems   Review of Systems  Constitutional: Negative for fever.  Respiratory: Positive for cough and shortness of breath.   Cardiovascular: Positive for leg swelling. Negative for chest pain.  Gastrointestinal: Positive for abdominal distention. Negative for vomiting.  All other systems reviewed and are negative.   Physical Exam Updated Vital Signs BP 135/77   Pulse 71   Temp 98.6 F (37 C) (Oral)   Resp (!) 24   Ht 1.702 m (5\' 7" )   Wt 83 kg   SpO2 91%   BMI 28.66 kg/m   Physical Exam CONSTITUTIONAL: Chronically ill-appearing HEAD: Normocephalic/atraumatic EYES: EOMI/PERRL ENMT: Mucous membranes moist NECK: supple no meningeal signs, positive JVD SPINE/BACK:entire spine nontender CV: Irregular, no loud murmurs LUNGS: Crackles bilaterally, scattered wheeze noted, mild tachypnea, patient is hypoxic on room air ABDOMEN: soft, nontender distention noted GU:no cva tenderness NEURO: Pt is awake/alert/appropriate, moves all extremitiesx4.  No facial droop.   EXTREMITIES: pulses normal/equal, full ROM, pitting edema to bilateral lower extremities SKIN: warm, color normal PSYCH: no abnormalities of mood noted, alert and oriented to situation  ED Results / Procedures / Treatments   Labs (all labs ordered are listed, but only abnormal results are displayed) Labs Reviewed  BASIC METABOLIC PANEL - Abnormal; Notable for the following components:      Result Value   Potassium  2.9 (*)    Glucose, Bld 124 (*)    Creatinine, Ser 1.78 (*)    Calcium 8.3 (*)    GFR, Estimated 39 (*)    All other components within normal limits  CBC WITH DIFFERENTIAL/PLATELET - Abnormal; Notable for the following components:   RBC 3.63 (*)  Hemoglobin 11.6 (*)    MCV 107.7 (*)    MCHC 29.7 (*)    All other components within normal limits  BRAIN NATRIURETIC PEPTIDE - Abnormal; Notable for the following components:   B Natriuretic Peptide 439.0 (*)    All other components within normal limits  RESP PANEL BY RT-PCR (FLU A&B, COVID) [id number]    EKG ED ECG REPORT   Date: 06/03/2020 0223am  Rate: 81  Rhythm: atrial fibrillation  QRS Axis: normal  Intervals: normal  ST/T Wave abnormalities: nonspecific ST changes  Conduction Disutrbances:none  Narrative Interpretation:   Old EKG Reviewed: unchanged  I have personally reviewed the EKG tracing and agree with the computerized printout as noted.  Radiology DG Chest Port 1 View  Result Date: 06/03/2020 CLINICAL DATA:  Short of breath for 2 weeks, lower extremity edema EXAM: PORTABLE CHEST 1 VIEW COMPARISON:  10/29/2015 FINDINGS: Single frontal view of the chest demonstrates enlarged cardiac silhouette. Postsurgical changes from CABG. There is increased central vascular congestion, with bibasilar airspace disease left greater than right. Bilateral pleural effusions are identified, left greater than right. No pneumothorax. IMPRESSION: 1. Findings most consistent with congestive heart failure. Electronically Signed   By: Randa Ngo M.D.   On: 06/03/2020 03:07    Procedures Procedures   Medications Ordered in ED Medications  nitroGLYCERIN (NITROGLYN) 2 % ointment 1 inch (1 inch Topical Given 06/03/20 0349)  furosemide (LASIX) injection 80 mg (80 mg Intravenous Given 06/03/20 0351)    ED Course  I have reviewed the triage vital signs and the nursing notes.  Pertinent labs & imaging results that were available during my care  of the patient were reviewed by me and considered in my medical decision making (see chart for details).    MDM Rules/Calculators/A&P                           This patient presents to the ED for concern of shortness of breath, this involves an extensive number of treatment options, and is a complaint that carries with it a high risk of complications and morbidity.  The differential diagnosis includes CHF, COPD, acute coronary syndrome, pneumonia, COVID-19, pulmonary embolism   Lab Tests:   I Ordered, reviewed, and interpreted labs, which included electrolytes, complete blood count, BNP  Medicines ordered:   I ordered medication Lasix and nitroglycerin for heart failure  Imaging Studies ordered:   I ordered imaging studies which included chest x-ray   I independently visualized and interpreted imaging which showed pulmonary edema  Additional history obtained:    Previous records obtained and reviewed   Consultations Obtained:   I consulted [facility] hospitalist Dr. Josephine Cables and discussed lab and imaging findings  Reevaluation:  After the interventions stated above, I reevaluated the patient and found patient stable  Critical Interventions:  . Lasix and nitroglycerin  Patient found to be in acute CHF.  It is unclear if he is supposed to be on oxygen chronically.  I do not see any recent diagnosis for CHF.  Discussed with hospitalist for admission  Final Clinical Impression(s) / ED Diagnoses Final diagnoses:  Acute pulmonary edema ([place])  Acute systolic congestive heart failure [facility])    Rx / DC Orders ED Discharge Orders    None       Ripley Fraise, MD 06/03/20 [id number]

## 2020-06-03 NOTE — Progress Notes (Signed)
*  PRELIMINARY RESULTS* Echocardiogram 2D Echocardiogram has been performed.  Leavy Cella 06/03/2020, 3:57 PM

## 2020-06-03 NOTE — H&P (Signed)
History and Physical  ZEVEN Gaines CBJ:[id number] DOB: 1940-12-11 DOA: 06/03/2020  Referring physician: Ripley Fraise, MD PCP: Donald Squibb, MD  Patient coming from: Home  Chief Complaint: Shortness of breath  HPI: Donald Gaines is a 80 y.o. male with medical history significant for COPD, CAD, prediabetes, hypertension, atrial fibrillation and tobacco abuse who presents to the ED via EMS due to shortness of breath.  He complained of 2-3-week onset of increasing shortness of breath which worsens with exertion, patient states that he could barely walk 10 feet without being short of breath, he also complained of increased leg swelling and abdominal distention.  Patient states that he woke up early this morning from sleep gasping for breath and thought that he was going to die, EMS was activated and patient was sent to the ED for further evaluation and management. Patient states that he used to use supplemental oxygen via [place], but his sister was able to convince him to stop using it about 3 years ago, he states that he has been fine since then until he started to have breathing problems about 2-3 weeks ago.  ED Course: In the emergency department, he was intermittently tachypneic, other vital signs were within normal range except for O2 sat where patient required supplemental oxygen at 4 LPM to maintain O2 sat at 92-100%.  Work-up in the ED showed macrocytic anemia, Hypokalemia, BUN/creatinine 13/1.78 (baseline creatinine at 2.2-2.9).  SARS coronavirus 2 was negative. Chest x-ray showed findings most consistent with congestive heart failure. IV Lasix 80 mg x 1 was given, nitroglycerin ointment was provided.  Hospitalist was asked to admit patient for further evaluation and management.   Review of Systems: Constitutional: Negative for chills and fever.  HENT: Negative for ear pain and sore throat.   Eyes: Negative for pain and visual disturbance.  Respiratory: Positive for cough and  shortness of breath.  Negative for chest tightness   Cardiovascular: Positive for leg swelling.  Negative for chest pain and palpitations.  Gastrointestinal: Positive for abdominal distention.  Negative for vomiting.  Endocrine: Negative for polyphagia and polyuria.  Genitourinary: Negative for decreased urine volume, dysuria, enuresis, hematuria Musculoskeletal: Negative for arthralgias and back pain.  Skin: Negative for color change and rash.  Allergic/Immunologic: Negative for immunocompromised state.  Neurological: Negative for tremors, syncope, speech difficulty, weakness, light-headedness and headaches.  Hematological: Does not bruise/bleed easily.  All other systems reviewed and are negative    Past Medical History:  Diagnosis Date   Arteriovenous malformation small bowel    Capsule study 3/10   Bilateral carotid artery stenosis    BPH (benign prostatic hypertrophy)    Candida esophagitis (HCC)    Cataract    Chronic anemia    Chronic diarrhea    COPD (chronic obstructive pulmonary disease) (HCC)    Coronary atherosclerosis of native coronary artery    Multivessel, BMS SVG TO RCA 2000, reportedly  "2" additional stents in interim   Dementia ([place])    Depression    Diabetes mellitus, type 2 ([place])    Essential hypertension    GERD (gastroesophageal reflux disease)    GI bleeding    Cecal ulcers, arteriovenous malformations   Glaucoma    Hemorrhoids    Hyperlipidemia    Myocardial infarction ([place])    1994   Portal hypertensive gastropathy ([place]) 11/08/2012   EGD. Dr. Laural Golden   Restrictive lung disease    Sleep apnea    TIA (transient ischemic attack)    2005  Past Surgical History:  Procedure Laterality Date   CAPSULE ENDO  03/10   cardiac catherization  04/2012   CARDIAC CATHETERIZATION  04/05/1996 Donald Gaines   EF 45%, occluded SVG to circumflex, patent SVG to D1 and PDA and patent LIMA to LAD with severe native vessel disease.    CARDIAC CATHETERIZATION  08/22/1998 Donald Gaines   Mild LM, severe LAD,severe CX, occlude RCA, patient  SVG to diatal RCA, patent vein graft to D1 and patent LIMA to LAD.    CATARACT EXTRACTION W/PHACO Right 09/22/2012   Procedure: CATARACT EXTRACTION PHACO AND INTRAOCULAR LENS PLACEMENT (IOC);  Surgeon: Donald Branch, MD;  Location: [facility];  Service: Ophthalmology;  Laterality: Right;  CDE: 11.43   CATARACT EXTRACTION W/PHACO Left 10/17/2012   Procedure: CATARACT EXTRACTION PHACO AND INTRAOCULAR LENS PLACEMENT (IOC);  Surgeon: Donald Branch, MD;  Location: [facility];  Service: Ophthalmology;  Laterality: Left;  CDE: 8.06   COLONOSCOPY  APR 2008   SIMPLE ADENOMA, Zeb TICS, IH   COLONOSCOPY  FEB 2008 ANEMIA. MELENA    2 LARGE ILEOCEAL ULCERS 2o to ASA, [place] TICS, IH   COLONOSCOPY  SEP 2009 TRANSFUSION DEP ANEMIA   AC AVM-ABLATED, [place] TICS, IH   COLONOSCOPY WITH ESOPHAGOGASTRODUODENOSCOPY (EGD) N/A 11/08/2012   Procedure: COLONOSCOPY WITH ESOPHAGOGASTRODUODENOSCOPY (EGD);  Surgeon: Donald Houston, MD;  Location: [facility];  Service: Endoscopy;  Laterality: N/A;  250-rescheduled to 7:30 Ann to notify pt   CORONARY ANGIOPLASTY WITH STENT PLACEMENT  08/22/1998 Donald Gaines   TEC stenting of the SVG to RCA-Last seen by cardiologist in 2010.   CORONARY ARTERY BYPASS GRAFT  1995-triple bypass   LIMA-LAD, LIMA to intermediate Gaines, SVG to PD and second OM,   ESOPHAGOGASTRODUODENOSCOPY  SEP 09   DUODENAL LIPOMA   ESOPHAGOGASTRODUODENOSCOPY N/A 09/27/2013   Procedure: ESOPHAGOGASTRODUODENOSCOPY (EGD) Push enteroscopy;  Surgeon: Donald Houston, MD;  Location: [facility];  Service: Endoscopy;  Laterality: N/A;   GIVENS CAPSULE STUDY N/A 11/24/2012   Procedure: GIVENS CAPSULE STUDY;  Surgeon: Donald Houston, MD;  Location: [facility];  Service: Endoscopy;  Laterality: N/A;  [place]  09/27/2013   Procedure: HOT HEMOSTASIS (ARGON PLASMA COAGULATION/BICAP);  Surgeon: Donald Houston, MD;   Location: [facility];  Service: Endoscopy;;   HYDROGEN BREATH TEST  2009   RIGHT INGUINAL HERNIA REPAIR     UPPER GASTROINTESTINAL ENDOSCOPY  SEP 2009   GASTRIC AVM ABLATED, NL DUO Bx    Social History:  reports that he has been smoking cigarettes. He started smoking about 71 years ago. He has a 60.00 pack-year smoking history. He has never used smokeless tobacco. He reports current drug use. Drug: Marijuana. He reports that he does not drink alcohol.   Allergies  Allergen Reactions   Penicillins Rash    Has patient had a PCN reaction causing immediate rash, facial/tongue/throat swelling, SOB or lightheadedness with hypotension: Yes Has patient had a PCN reaction causing severe rash involving mucus membranes or skin necrosis: No Has patient had a PCN reaction that required hospitalization No Has patient had a PCN reaction occurring within the last 10 years: No If all of the above answers are "NO", then may proceed with Cephalosporin use.     Family History  Problem Relation Age of Onset   Heart attack Mother    Heart disease Mother    Hypertension Mother     Prior to Admission medications   Medication Sig Start Date End  Date Taking? Authorizing Provider  acetaminophen (TYLENOL) 500 MG tablet Take 500 mg by mouth every 6 (six) hours as needed for headache.    [provider]  albuterol (PROVENTIL) (2.5 MG/3ML) 0.083% nebulizer solution Take 3 mLs (2.5 mg total) by nebulization every 2 (two) hours as needed for wheezing or shortness of breath. 08/02/15   Orvan Falconer, MD  ALPRAZolam Duanne Moron) 0.5 MG tablet Take 0.5 mg by mouth 2 (two) times daily as needed for anxiety.    [provider]  carvedilol (COREG) 12.5 MG tablet Take 12.5 mg by mouth 2 (two) times daily with a meal.  03/26/14   [provider]  cetirizine (ZYRTEC) 10 MG tablet Take 10 mg by mouth at bedtime.     [provider]  clopidogrel (PLAVIX) 75 MG tablet Take 1 tablet (75 mg  total) by mouth every morning. 09/27/13   Nita Sells, MD  escitalopram (LEXAPRO) 5 MG tablet Take 5 mg by mouth daily.  04/16/15   [provider]  ferrous sulfate 325 (65 FE) MG tablet Take 325 mg by mouth daily with breakfast.    [provider]  finasteride (PROSCAR) 5 MG tablet Take 5 mg by mouth daily.    [provider]  furosemide (LASIX) 40 MG tablet Take 40 mg by mouth daily.    [provider]  gabapentin (NEURONTIN) 100 MG capsule Take 100 mg by mouth at bedtime.  09/20/14   [provider]  isosorbide mononitrate (IMDUR) 60 MG 24 hr tablet Take 1 tablet (60 mg total) by mouth daily. 06/01/13   Lendon Colonel, NP  lisinopril (PRINIVIL,ZESTRIL) 2.5 MG tablet Take 2.5 mg by mouth daily.    [provider]  NITROSTAT 0.4 MG SL tablet Place 0.4 mg under the tongue every 5 (five) minutes as needed for chest pain.  05/23/10   [provider]  omeprazole (PRILOSEC) 20 MG capsule Take 20 mg by mouth daily.  08/28/13   [provider]  rosuvastatin (CRESTOR) 20 MG tablet Take 1 tablet (20 mg total) by mouth daily. 12/12/13   Satira Sark, MD  umeclidinium-vilanterol (ANORO ELLIPTA) 62.5-25 MCG/INH AEPB Inhale 1 puff into the lungs daily.    [provider]    Physical Exam: BP (!) 133/59 (BP Location: Right Arm)    Pulse 71    Temp (!) 97.5 F (36.4 C) (Oral)    Resp 12    Ht 5\' 7"  (1.702 m)    Wt 86.1 kg    SpO2 100%    BMI 29.73 kg/m    General: 80 y.o. year-old male ill appearing but in no acute distress.  Alert and oriented x3.  HEENT: NCAT, EOMI  Neck: Supple, trachea medial  Cardiovascular: Irregular rate and rhythm with no rubs or gallops.  No thyromegaly or JVD noted.  2/4 pulses in all 4 extremities.  Respiratory:  Bilateral rales in lower lobes and scattered wheezes.  Abdomen: Soft nontender nondistended with normal bowel sounds x4 quadrants.  Muskuloskeletal: +2 edema in bilateral  lower extremities.  No cyanosis or clubbing noted bilaterally  Neuro: CN II-XII intact, strength 5/5 x 4, sensation, reflexes intact  Skin: No ulcerative lesions noted or rashes  Psychiatry: Judgement and insight appear normal. Mood is appropriate for condition and setting          Labs on Admission:  Basic Metabolic Panel: Recent Labs  Lab 06/03/20 0237  NA 143  K 2.9*  CL 105  CO2  29  GLUCOSE 124*  BUN 13  CREATININE 1.78*  CALCIUM 8.3*   Liver Function Tests: No results for input(s): AST, ALT, ALKPHOS, BILITOT, PROT, ALBUMIN in the last 168 hours. No results for input(s): LIPASE, AMYLASE in the last 168 hours. No results for input(s): AMMONIA in the last 168 hours. CBC: Recent Labs  Lab 06/03/20 0237  WBC 6.0  NEUTROABS 4.5  HGB 11.6*  HCT 39.1  MCV 107.7*  PLT 179   Cardiac Enzymes: No results for input(s): CKTOTAL, CKMB, CKMBINDEX, TROPONINI in the last 168 hours.  BNP (last 3 results) Recent Labs    06/03/20 0238  BNP 439.0*    ProBNP (last 3 results) No results for input(s): PROBNP in the last 8760 hours.  CBG: No results for input(s): GLUCAP in the last 168 hours.  Radiological Exams on Admission: DG Chest Port 1 View  Result Date: 06/03/2020 CLINICAL DATA:  Short of breath for 2 weeks, lower extremity edema EXAM: PORTABLE CHEST 1 VIEW COMPARISON:  10/29/2015 FINDINGS: Single frontal view of the chest demonstrates enlarged cardiac silhouette. Postsurgical changes from CABG. There is increased central vascular congestion, with bibasilar airspace disease left greater than right. Bilateral pleural effusions are identified, left greater than right. No pneumothorax. IMPRESSION: 1. Findings most consistent with congestive heart failure. Electronically Signed   By: Randa Ngo M.D.   On: 06/03/2020 03:07    EKG: I independently viewed the EKG done and my findings are as followed: Atrial fibrillation with rate control and prolonged QTc (669  ms  Assessment/Plan Present on Admission:  Hypokalemia  COPD (chronic obstructive pulmonary disease) (HCC)  Persistent atrial fibrillation (HCC)  CKD (chronic kidney disease) stage 3, GFR 30-59 ml/min (HCC)  TOBACCO ABUSE  Essential hypertension, benign  GERD  Mixed hyperlipidemia  Coronary atherosclerosis of native coronary artery  Principal Problem:   CHF (congestive heart failure) (HCC) Active Problems:   TOBACCO ABUSE   GERD   Essential hypertension, benign   Mixed hyperlipidemia   Coronary atherosclerosis of native coronary artery   COPD (chronic obstructive pulmonary disease) (HCC)   Macrocytic anemia   Persistent atrial fibrillation (HCC)   Hypokalemia   CKD (chronic kidney disease) stage 3, GFR 30-59 ml/min (HCC)   Prolonged QT interval   Elevated brain natriuretic peptide (BNP) level   Prediabetes  Acute on chronic respiratory failure with hypoxia possibly secondary to new onset CHF in the setting of history of COPD Chest x-ray was suggestive of congestive heart failure Continue total input/output, daily weights and fluid restriction Patient was treated with IV Lasix 80 Mg x1, continue IV Lasix 40 mg twice a Continue Cardiac diet  Continue Proventil and Anoro Ellipta Echocardiogram done on 02/10/2017 showed LVEF of 65 to 70% with no diagnostic regional wall motion abnormality.  Echocardiogram will be done in the morning   Elevated BNP (chronically elevated) BNP 439, this was 409 about 4 years ago  Prolonged QTc (669) Avoid QT prolonging drugs Potassium will be replenished Magnesium level will be checked Repeat EKG in the morning  Macrocytic Anemia MCV 107.7, B12 and folate levels will be checked  Hypokalemia K+ 2.9, this will be replenished  Prediabetes Continue diet modification  GERD Continue Protonix  Essential hypertension Continue Coreg, lisinopril and IV Lasix 40 mg twice daily (as tolerated by BP)  Hyperlipidemia Continue  Crestor  CAD Continue Plavix, Coreg, Crestor  Tobacco abuse Patient counseled on tobacco abuse cessation  DVT prophylaxis: Lovenox  Code Status: Full code  Family Communication:  None at bedside  Disposition Plan:  Patient is from:                        home Anticipated DC to:                   SNF or family members home Anticipated DC date:               2-3 days Anticipated DC barriers:           Patient is unstable to be discharged at this time due to new onset CHF requiring inpatient management  Consults called: None  Admission status: Inpatient    Bernadette Hoit MD [facility] Pager [phone]  If 7PM-7AM, please contact night-coverage [email or web address] Password [email or web address]  06/03/2020, 5:30 AM

## 2020-06-04 LAB — PROTIME-INR
INR: 1.2 (ref 0.8–1.2)
Prothrombin Time: 14.5 seconds (ref 11.4–15.2)

## 2020-06-04 LAB — COMPREHENSIVE METABOLIC PANEL
ALT: 10 U/L (ref 0–44)
AST: 15 U/L (ref 15–41)
Albumin: 3 g/dL — ABNORMAL LOW (ref 3.5–5.0)
Alkaline Phosphatase: 73 U/L (ref 38–126)
Anion gap: 10 (ref 5–15)
BUN: 17 mg/dL (ref 8–23)
CO2: 28 mmol/L (ref 22–32)
Calcium: 8.4 mg/dL — ABNORMAL LOW (ref 8.9–10.3)
Chloride: 104 mmol/L (ref 98–111)
Creatinine, Ser: 1.68 mg/dL — ABNORMAL HIGH (ref 0.61–1.24)
GFR, Estimated: 42 mL/min — ABNORMAL LOW (ref >60)
Glucose, Bld: 116 mg/dL — ABNORMAL HIGH (ref 70–99)
Potassium: 3.8 mmol/L (ref 3.5–5.1)
Sodium: 142 mmol/L (ref 135–145)
Total Bilirubin: 0.8 mg/dL (ref 0.3–1.2)
Total Protein: 6.4 g/dL — ABNORMAL LOW (ref 6.5–8.1)

## 2020-06-04 LAB — MAGNESIUM: Magnesium: 2.2 mg/dL (ref 1.7–2.4)

## 2020-06-04 LAB — APTT: aPTT: 44 seconds — ABNORMAL HIGH (ref 24–36)

## 2020-06-04 MED ORDER — DM-GUAIFENESIN ER 30-600 MG PO TB12
1.0000 | ORAL_TABLET | Freq: Two times a day (BID) | ORAL | Status: DC
  Administered 2020-06-04 – 2020-06-05 (×2): 1 via ORAL
  Filled 2020-06-04 (×2): qty 1

## 2020-06-04 NOTE — Plan of Care (Signed)
Alert and oriented x 4. Denies pain or discomfort. Continues on supplemental oxygen and telemetry monitoring. IV lasix given and intake and output monitored. Call bell within reach. Continue to monitor.  Problem: Education: Goal: Ability to demonstrate management of disease process will improve Outcome: Progressing Goal: Ability to verbalize understanding of medication therapies will improve Outcome: Progressing Goal: Individualized Educational Video(s) Outcome: Progressing   Problem: Activity: Goal: Capacity to carry out activities will improve Outcome: Progressing   Problem: Cardiac: Goal: Ability to achieve and maintain adequate cardiopulmonary perfusion will improve Outcome: Progressing

## 2020-06-04 NOTE — Progress Notes (Signed)
PROGRESS NOTE    Donald Gaines  HAL:[id number] DOB: Jan 16, 1941 DOA: 06/03/2020 PCP: Celene Squibb, MD   Brief Narrative:   Donald Gaines a 80 y.o.malewith medical history significant forCOPD,CAD,prediabetes,hypertension, atrial fibrillationand tobacco abuse whopresentsto the ED via EMS due toshortness of breath.  Patient has been admitted with acute on chronic hypoxemic respiratory failure secondary to new onset CHF and has been started on diuresis with IV Lasix.  2D echocardiogram reveals LVEF 60-65% with indeterminate diastolic dysfunction.  It appears that he has not been taking his home Lasix as prescribed and usually only takes it once a day when he wakes up as opposed to twice a day.  He continues to diurese well.   Assessment & Plan:   Principal Problem:   CHF (congestive heart failure) (HCC) Active Problems:   TOBACCO ABUSE   GERD   Essential hypertension, benign   Mixed hyperlipidemia   Coronary atherosclerosis of native coronary artery   COPD (chronic obstructive pulmonary disease) (HCC)   Macrocytic anemia   Persistent atrial fibrillation (HCC)   Hypokalemia   CKD (chronic kidney disease) stage 3, GFR 30-59 ml/min (HCC)   Prolonged QT interval   Elevated brain natriuretic peptide (BNP) level   Prediabetes   Acute hypoxemic respiratory failure secondary to acute on chronic diastolic CHF exacerbation secondary to medication noncompliance -Patient was not taking his Lasix twice a day due to concerns of excessive urination at bedtime and therefore only took his morning dose.  He has been advised to take his medications morning and at noon as previously prescribed on discharge -Continue IV Lasix 40 mg twice daily for now and monitor intake and output with -2.3 L output in the last 24 hours -Continue monitoring renal function which remained stable, patient still volume overloaded -2D echocardiogram with LVEF 60-65% with indeterminate diastolic dysfunction on  7/35 -Continue fluid restriction -Patient did wear oxygen at home, but this was from another patient and it is uncertain whether he will require this on discharge  Macrocytic anemia -No overt bleeding noted -B12 and folate acid level is within normal limits  Prediabetes -Hemoglobin A1c 6.1%  GERD  -Continue PPI  Essential hypertension -Continue on Coreg and monitor on aggressive diuresis -Holding home lisinopril for now  Dyslipidemia/CAD -Continue Crestor -Continue Plavix and Coreg  Tobacco abuse -Counseled on cessation  DVT prophylaxis: Lovenox Code Status: Full Family Communication: None at bedside, patient will call Disposition Plan:  Status is: Inpatient  Remains inpatient appropriate because:IV treatments appropriate due to intensity of illness or inability to take PO and Inpatient level of care appropriate due to severity of illness   Dispo: The patient is from: Home              Anticipated d/c is to: Home              Patient currently is not medically stable to d/c.   Difficult to place patient No    Consultants:   None  Procedures:   See below  Antimicrobials:   None   Subjective: Patient seen and evaluated today with no new acute complaints or concerns. No acute concerns or events noted overnight.  He states that he is starting to breathe better and has less lower extremity edema.  Objective: Vitals:   06/03/20 2145 06/04/20 0511 06/04/20 0846 06/04/20 0903  BP: 122/64 138/68  130/73  Pulse: 68 76  63  Resp: 18 20  20   Temp: 98 F (36.7 C) 97.6 F (36.4  C)    TempSrc: Oral Oral    SpO2: 93% 96% 93% 94%  Weight:  86.1 kg    Height:        Intake/Output Summary (Last 24 hours) at 06/04/2020 1121 Last data filed at 06/04/2020 0500 Gross per 24 hour  Intake 340 ml  Output 2050 ml  Net -1710 ml   Filed Weights   06/03/20 0247 06/03/20 0428 06/04/20 0511  Weight: 88 kg 86.1 kg 86.1 kg    Examination:  General exam: Appears calm  and comfortable  Respiratory system: Clear to auscultation. Respiratory effort normal.  Currently on 2 L nasal cannula oxygen. Cardiovascular system: S1 & S2 heard, RRR.  Gastrointestinal system: Abdomen is soft Central nervous system: Alert and awake Extremities: Scant bilateral edema noted to bilateral ankles with less tenderness to palpation Skin: No significant lesions noted Psychiatry: Flat affect.    Data Reviewed: I have personally reviewed following labs and imaging studies  CBC: Recent Labs  Lab 06/03/20 0237  WBC 6.0  NEUTROABS 4.5  HGB 11.6*  HCT 39.1  MCV 107.7*  PLT 599   Basic Metabolic Panel: Recent Labs  Lab 06/03/20 0237 06/03/20 0439 06/04/20 0646  NA 143  --  142  K 2.9*  --  3.8  CL 105  --  104  CO2 29  --  28  GLUCOSE 124*  --  116*  BUN 13  --  17  CREATININE 1.78*  --  1.68*  CALCIUM 8.3*  --  8.4*  MG  --  2.4 2.2  PHOS  --  3.3  --    GFR: Estimated Creatinine Clearance: 38.6 mL/min (A) (by C-G formula based on SCr of 1.68 mg/dL (H)). Liver Function Tests: Recent Labs  Lab 06/04/20 0646  AST 15  ALT 10  ALKPHOS 73  BILITOT 0.8  PROT 6.4*  ALBUMIN 3.0*   No results for input(s): LIPASE, AMYLASE in the last 168 hours. No results for input(s): AMMONIA in the last 168 hours. Coagulation Profile: Recent Labs  Lab 06/04/20 0646  INR 1.2   Cardiac Enzymes: No results for input(s): CKTOTAL, CKMB, CKMBINDEX, TROPONINI in the last 168 hours. BNP (last 3 results) No results for input(s): PROBNP in the last 8760 hours. HbA1C: Recent Labs    06/03/20 0439  HGBA1C 6.1*   CBG: No results for input(s): GLUCAP in the last 168 hours. Lipid Profile: No results for input(s): CHOL, HDL, LDLCALC, TRIG, CHOLHDL, LDLDIRECT in the last 72 hours. Thyroid Function Tests: No results for input(s): TSH, T4TOTAL, FREET4, T3FREE, THYROIDAB in the last 72 hours. Anemia Panel: Recent Labs    06/03/20 0554 06/03/20 0555  VITAMINB12 279  --    FOLATE  --  6.9   Sepsis Labs: No results for input(s): PROCALCITON, LATICACIDVEN in the last 168 hours.  Recent Results (from the past 240 hour(s))  Resp Panel by RT-PCR (Flu A&B, Covid) Nasopharyngeal Swab     Status: None   Collection Time: 06/03/20  2:47 AM   Specimen: Nasopharyngeal Swab; Nasopharyngeal(NP) swabs in vial transport medium  Result Value Ref Range Status   SARS Coronavirus 2 by RT PCR NEGATIVE NEGATIVE Final    Comment: (NOTE) SARS-CoV-2 target nucleic acids are NOT DETECTED.  The SARS-CoV-2 RNA is generally detectable in upper respiratory specimens during the acute phase of infection. The lowest concentration of SARS-CoV-2 viral copies this assay can detect is 138 copies/mL. A negative result does not preclude SARS-Cov-2 infection and should not be  used as the sole basis for treatment or other patient management decisions. A negative result may occur with  improper specimen collection/handling, submission of specimen other than nasopharyngeal swab, presence of viral mutation(s) within the areas targeted by this assay, and inadequate number of viral copies(<138 copies/mL). A negative result must be combined with clinical observations, patient history, and epidemiological information. The expected result is Negative.  Fact Sheet for Patients:  EntrepreneurPulse.com.au  Fact Sheet for Healthcare Providers:  IncredibleEmployment.be  This test is no t yet approved or cleared by the [place] FDA and  has been authorized for detection and/or diagnosis of SARS-CoV-2 by FDA under an Emergency Use Authorization (EUA). This EUA will remain  in effect (meaning this test can be used) for the duration of the COVID-19 declaration under Section 564(b)(1) of the Act, 21 U.S.C.section 360bbb-3(b)(1), unless the authorization is terminated  or revoked sooner.       Influenza A by PCR NEGATIVE NEGATIVE Final   Influenza B by PCR  NEGATIVE NEGATIVE Final    Comment: (NOTE) The Xpert Xpress SARS-CoV-2/FLU/RSV plus assay is intended as an aid in the diagnosis of influenza from Nasopharyngeal swab specimens and should not be used as a sole basis for treatment. Nasal washings and aspirates are unacceptable for Xpert Xpress SARS-CoV-2/FLU/RSV testing.  Fact Sheet for Patients: EntrepreneurPulse.com.au  Fact Sheet for Healthcare Providers: IncredibleEmployment.be  This test is not yet approved or cleared by the [place] FDA and has been authorized for detection and/or diagnosis of SARS-CoV-2 by FDA under an Emergency Use Authorization (EUA). This EUA will remain in effect (meaning this test can be used) for the duration of the COVID-19 declaration under Section 564(b)(1) of the Act, 21 U.S.C. section 360bbb-3(b)(1), unless the authorization is terminated or revoked.  Performed at [facility], [address] [place], [place] [address]          Radiology Studies: [facility] Chest [facility] 1 View  Result Date: 06/03/2020 CLINICAL DATA:  Short of breath for 2 weeks, lower extremity edema EXAM: PORTABLE CHEST 1 VIEW COMPARISON:  10/29/2015 FINDINGS: Single frontal view of the chest demonstrates enlarged cardiac silhouette. Postsurgical changes from CABG. There is increased central vascular congestion, with bibasilar airspace disease left greater than right. Bilateral pleural effusions are identified, left greater than right. No pneumothorax. IMPRESSION: 1. Findings most consistent with congestive heart failure. Electronically Signed   By: Randa Ngo M.D.   On: 06/03/2020 03:07   ECHOCARDIOGRAM COMPLETE  Result Date: 06/03/2020    ECHOCARDIOGRAM REPORT   Patient Name:   DURELL LOFASO Date of Exam: 06/03/2020 Medical Rec #:  [id number]        Height:       67.0 in Accession #:    [id number]       Weight:       189.8 lb Date of Birth:  08-Nov-1940       BSA:          1.978 m Patient  Age:    8 years         BP:           106/88 mmHg Patient Gender: M                HR:           71 bpm. Exam Location:  [facility] Procedure: 2D Echo Indications:    CHF-Acute Diastolic I78.67  History:        Patient has prior history of Echocardiogram  examinations, most                 recent 02/10/2017. CHF, COPD; Risk Factors:Current Smoker,                 Diabetes and Hypertension. GERD.  Sonographer:    Leavy Cella RDCS (AE) Referring Phys: [id number] OLADAPO ADEFESO IMPRESSIONS  1. Poor acoustic window and ectopy make evaluation of LVEF difficult, even with the use of Defiinity. Left ventricular ejection fraction, by estimation, is 60 to 65%. There is moderate left ventricular hypertrophy. Left ventricular diastolic parameters are indeterminate.  2. Right ventricular systolic function is normal. The right ventricular size is normal.  3. Left atrial size was severely dilated.  4. Right atrial size was mildly dilated.  5. The mitral valve is normal in structure. Trivial mitral valve regurgitation.  6. Aortic valve regurgitation is not visualized. Mild to moderate aortic valve sclerosis/calcification is present, without any evidence of aortic stenosis.  7. The inferior vena cava is normal in size with greater than 50% respiratory variability, suggesting right atrial pressure of 3 mmHg. FINDINGS  Left Ventricle: Poor acoustic window and ectopy make evaluation of LVEF difficult, even with the use of Defiinity. Left ventricular ejection fraction, by estimation, is 60 to 65%. The left ventricle has normal function. The left ventricular internal cavity size was normal in size. There is moderate left ventricular hypertrophy. Left ventricular diastolic parameters are indeterminate. Right Ventricle: The right ventricular size is normal. Right vetricular wall thickness was not assessed. Right ventricular systolic function is normal. Left Atrium: Left atrial size was severely dilated. Right Atrium: Right atrial size  was mildly dilated. Pericardium: There is no evidence of pericardial effusion. Mitral Valve: The mitral valve is normal in structure. Trivial mitral valve regurgitation. Tricuspid Valve: The tricuspid valve is grossly normal. Tricuspid valve regurgitation is mild. Aortic Valve: Aortic valve regurgitation is not visualized. Mild to moderate aortic valve sclerosis/calcification is present, without any evidence of aortic stenosis. Pulmonic Valve: The pulmonic valve was grossly normal. Pulmonic valve regurgitation is trivial. Aorta: The aortic root is normal in size and structure. Venous: The inferior vena cava is normal in size with greater than 50% respiratory variability, suggesting right atrial pressure of 3 mmHg. IAS/Shunts: The interatrial septum was not assessed.  LEFT VENTRICLE PLAX 2D LVIDd:         4.16 cm  Diastology LVIDs:         2.62 cm  LV e' medial:    3.92 cm/s LV PW:         1.39 cm  LV E/e' medial:  22.9 LV IVS:        1.26 cm  LV e' lateral:   6.60 cm/s LVOT diam:     2.00 cm  LV E/e' lateral: 13.6 LVOT Area:     3.14 cm  RIGHT VENTRICLE RV S prime:     7.01 cm/s TAPSE (M-mode): 1.6 cm LEFT ATRIUM             Index       RIGHT ATRIUM           Index LA diam:        4.90 cm 2.48 cm/m  RA Area:     16.90 cm LA Vol (A2C):   79.2 ml 40.04 ml/m RA Volume:   45.80 ml  23.16 ml/m LA Vol (A4C):   88.4 ml 44.70 ml/m LA Biplane Vol: 90.9 ml 45.96 ml/m   AORTA Ao Root diam:  3.00 cm MITRAL VALVE               TRICUSPID VALVE MV Area (PHT): 2.36 cm    TR Peak grad:   26.2 mmHg MV Decel Time: 322 msec    TR Vmax:        256.00 cm/s MV E velocity: 89.70 cm/s                            SHUNTS                            Systemic Diam: 2.00 cm Dorris Carnes MD Electronically signed by Dorris Carnes MD Signature Date/Time: 06/03/2020/9:40:42 PM    Final         Scheduled Meds: . carvedilol  12.5 mg Oral BID WC  . clopidogrel  75 mg Oral q morning  . enoxaparin (LOVENOX) injection  40 mg Subcutaneous Q24H  .  finasteride  5 mg Oral Daily  . furosemide  40 mg Intravenous Q12H  . gabapentin  100 mg Oral QHS  . loratadine  10 mg Oral Daily  . nicotine  7 mg Transdermal Daily  . pantoprazole  40 mg Oral Daily  . potassium chloride  40 mEq Oral Daily  . rosuvastatin  20 mg Oral Daily  . umeclidinium-vilanterol  1 puff Inhalation Daily    LOS: 1 day    Time spent: 35 minutes    Nevyn Bossman Darleen Crocker, DO [facility]  If 7PM-7AM, please contact night-coverage [email or web address] 06/04/2020, 11:21 AM

## 2020-06-05 DIAGNOSIS — E876 Hypokalemia: Secondary | ICD-10-CM | POA: Diagnosis not present

## 2020-06-05 DIAGNOSIS — I5030 Unspecified diastolic (congestive) heart failure: Secondary | ICD-10-CM

## 2020-06-05 DIAGNOSIS — D539 Nutritional anemia, unspecified: Secondary | ICD-10-CM | POA: Diagnosis not present

## 2020-06-05 DIAGNOSIS — N1832 Chronic kidney disease, stage 3b: Secondary | ICD-10-CM | POA: Diagnosis not present

## 2020-06-05 DIAGNOSIS — I10 Essential (primary) hypertension: Secondary | ICD-10-CM | POA: Diagnosis not present

## 2020-06-05 DIAGNOSIS — J449 Chronic obstructive pulmonary disease, unspecified: Secondary | ICD-10-CM | POA: Diagnosis not present

## 2020-06-05 DIAGNOSIS — E782 Mixed hyperlipidemia: Secondary | ICD-10-CM | POA: Diagnosis not present

## 2020-06-05 LAB — BASIC METABOLIC PANEL
Anion gap: 10 (ref 5–15)
BUN: 23 mg/dL (ref 8–23)
CO2: 29 mmol/L (ref 22–32)
Calcium: 9.1 mg/dL (ref 8.9–10.3)
Chloride: 102 mmol/L (ref 98–111)
Creatinine, Ser: 1.85 mg/dL — ABNORMAL HIGH (ref 0.61–1.24)
GFR, Estimated: 37 mL/min — ABNORMAL LOW (ref >60)
Glucose, Bld: 125 mg/dL — ABNORMAL HIGH (ref 70–99)
Potassium: 4.2 mmol/L (ref 3.5–5.1)
Sodium: 141 mmol/L (ref 135–145)

## 2020-06-05 LAB — MAGNESIUM: Magnesium: 2.3 mg/dL (ref 1.7–2.4)

## 2020-06-05 MED ORDER — FUROSEMIDE 40 MG PO TABS: 40.0000 mg | ORAL_TABLET | Freq: Two times a day (BID) | ORAL | Status: AC

## 2020-06-05 MED ORDER — FUROSEMIDE 40 MG PO TABS
40.0000 mg | ORAL_TABLET | Freq: Every day | ORAL | Status: DC
  Administered 2020-06-05: 40 mg via ORAL
  Filled 2020-06-05: qty 1

## 2020-06-05 MED ORDER — POTASSIUM CHLORIDE CRYS ER 20 MEQ PO TBCR: 20.0000 meq | EXTENDED_RELEASE_TABLET | Freq: Every day | ORAL | 1 refills | Status: DC

## 2020-06-05 MED ORDER — ALBUTEROL SULFATE (2.5 MG/3ML) 0.083% IN NEBU
2.5000 mg | INHALATION_SOLUTION | RESPIRATORY_TRACT | 12 refills | Status: AC | PRN
Start: 2020-06-05 — End: ?

## 2020-06-05 MED ORDER — ISOSORBIDE MONONITRATE ER 30 MG PO TB24: 15.0000 mg | ORAL_TABLET | Freq: Every day | ORAL | 0 refills | Status: DC

## 2020-06-05 NOTE — Progress Notes (Signed)
SATURATION QUALIFICATIONS: (This note is used to comply with regulatory documentation for home oxygen)  Patient Saturations on Room Air at Rest =92%   Patient Saturations on Room Air while Ambulating = 82%  Patient Saturations on 2 Liters of oxygen while Ambulating = 93%  Please briefly explain why patient needs home oxygen: Upon ambulating patient c/o with SOB and O2 dropped to 82% RA.

## 2020-06-05 NOTE — Plan of Care (Signed)
Alert and oriented x 4. Denies pain or discomfort. Continues on IV lasix. Intake and output monitored. Continues on supplemental oxygen. Urinal at bedside. Continue plan of care.  Problem: Education: Goal: Ability to demonstrate management of disease process will improve Outcome: Progressing Goal: Ability to verbalize understanding of medication therapies will improve Outcome: Progressing Goal: Individualized Educational Video(s) Outcome: Progressing   Problem: Activity: Goal: Capacity to carry out activities will improve Outcome: Progressing   Problem: Cardiac: Goal: Ability to achieve and maintain adequate cardiopulmonary perfusion will improve Outcome: Progressing   Problem: Education: Goal: Knowledge of General Education information will improve Description: Including pain rating scale, medication(s)/side effects and non-pharmacologic comfort measures Outcome: Progressing   Problem: Health Behavior/Discharge Planning: Goal: Ability to manage health-related needs will improve Outcome: Progressing   Problem: Clinical Measurements: Goal: Ability to maintain clinical measurements within normal limits will improve Outcome: Progressing Goal: Will remain free from infection Outcome: Progressing Goal: Diagnostic test results will improve Outcome: Progressing Goal: Respiratory complications will improve Outcome: Progressing Goal: Cardiovascular complication will be avoided Outcome: Progressing   Problem: Activity: Goal: Risk for activity intolerance will decrease Outcome: Progressing   Problem: Nutrition: Goal: Adequate nutrition will be maintained Outcome: Progressing   Problem: Coping: Goal: Level of anxiety will decrease Outcome: Progressing   Problem: Elimination: Goal: Will not experience complications related to bowel motility Outcome: Progressing Goal: Will not experience complications related to urinary retention Outcome: Progressing   Problem: Pain  Managment: Goal: General experience of comfort will improve Outcome: Progressing   Problem: Safety: Goal: Ability to remain free from injury will improve Outcome: Progressing   Problem: Skin Integrity: Goal: Risk for impaired skin integrity will decrease Outcome: Progressing

## 2020-06-05 NOTE — TOC Transition Note (Signed)
Transition of Care [facility]) - CM/SW Discharge Note   Patient Details  Name: Donald Gaines MRN: [id number] Date of Birth: 06-11-1940  [facility]) CM/SW Contact:  Ihor Gully, LCSW Phone Number: 06/05/2020, 2:13 PM   Clinical Narrative:    Patient from home. In need of home oxygen. DME suppliers discussed. Order given to [place].    Final next level of care: Home/Self Care Barriers to Discharge: No Barriers Identified   Patient Goals and CMS Choice        Discharge Placement                       Discharge Plan and Services                DME Arranged: Oxygen DME Agency: [facility] Date DME Agency Contacted: 06/05/20 Time DME Agency Contacted: [phone] Representative spoke with at DME Agency: [place]            Social Determinants of Health ([place]) Interventions     Readmission Risk Interventions No flowsheet data found.

## 2020-06-05 NOTE — Discharge Summary (Signed)
Physician Discharge Summary  Donald Gaines:[id number] DOB: September 07, 1940 DOA: 06/03/2020  PCP: Celene Squibb, MD Cardiology: Dennard Schaumann date: 06/03/2020 Discharge date: 06/05/2020  Admitted From:  Home  Disposition: Home   Recommendations for Outpatient Follow-up:  1. Follow up with PCP in 1 weeks 2. Please obtain BMP in one week  Discharge Condition: STABLE   CODE STATUS: FULL DIET: heart healthy    Brief Hospitalization Summary: Please see all hospital notes, images, labs for full details of the hospitalization. ADMISSION HPI: Donald Gaines is a 80 y.o. male with medical history significant for COPD, CAD, prediabetes, hypertension, atrial fibrillation and tobacco abuse who presents to the ED via EMS due to shortness of breath.  He complained of 2-3-week onset of increasing shortness of breath which worsens with exertion, patient states that he could barely walk 10 feet without being short of breath, he also complained of increased leg swelling and abdominal distention.  Patient states that he woke up early this morning from sleep gasping for breath and thought that he was going to die, EMS was activated and patient was sent to the ED for further evaluation and management. Patient states that he used to use supplemental oxygen via [facility], but his sister was able to convince him to stop using it about 3 years ago, he states that he has been fine since then until he started to have breathing problems about 2-3 weeks ago.  ED Course: In the emergency department, he was intermittently tachypneic, other vital signs were within normal range except for O2 sat where patient required supplemental oxygen at 4 LPM to maintain O2 sat at 92-100%.  Work-up in the ED showed macrocytic anemia, Hypokalemia, BUN/creatinine 13/1.78 (baseline creatinine at 2.2-2.9).  SARS coronavirus 2 was negative. Chest x-ray showed findings most consistent with congestive heart failure.  IV Lasix 80 mg x 1 was given,  nitroglycerin ointment was provided.  Hospitalist was asked to admit patient for further evaluation and management.    Hospital Course   Brief Narrative:   Donald Radin Fergusonis a 80 y.o.malewith medical history significant forCOPD,CAD,prediabetes,hypertension, atrial fibrillationand tobacco abuse whopresentsto the ED via EMS due toshortness of breath.Patient has been admitted with acute on chronic hypoxemic respiratory failure secondary to new onset CHF and has been started on diuresis with IV Lasix. 2D echocardiogram reveals LVEF 60-65% with indeterminate diastolic dysfunction. It appears that he has not been taking his home Lasix as prescribed and usually only takes it once a day when he wakes up as opposed to twice a day.  He continues to diurese well.   Assessment & Plan:   Principal Problem:   CHF (congestive heart failure)  Active Problems:   TOBACCO ABUSE   GERD   Essential hypertension, benign   Mixed hyperlipidemia   Coronary atherosclerosis of native coronary artery   COPD (chronic obstructive pulmonary disease)    Macrocytic anemia   Persistent atrial fibrillation    Hypokalemia   CKD (chronic kidney disease) stage 3, GFR 30-59 ml/min   Prolonged QT interval   Elevated brain natriuretic peptide (BNP) level   Prediabetes   Acute hypoxemic respiratory failure secondary to acute on chronic diastolic CHF exacerbation secondary to medication noncompliance -Patient was not taking his Lasix twice a day due to concerns of excessive urination at bedtime and therefore only took his morning dose.  He has been advised to take his medications morning and at noon as previously prescribed on discharge -He was treated with IV  Lasix 40 mg twice daily and monitor intake and output with -3.2 L output since admission, feeling fine.  He has been transitioned to oral lasix 40 mg BID.  -Continue outpatient monitoring of renal function.  -2D echocardiogram with LVEF 60-65% with  indeterminate diastolic dysfunction on 2/87 -Continue fluid restriction  Chronic hypoxic respiratory failure  -Patient did wear oxygen at home, but this was from an associate. -He qualified for home oxygen and it has been ordered 2L/min.    Macrocytic anemia -No overt bleeding noted -B12 and folate acid level is within normal limits  Prediabetes -Hemoglobin A1c 6.1%  GERD  -Continue PPI  Essential hypertension -Continue on Coreg and monitor on aggressive diuresis -Holding home lisinopril for now  Dyslipidemia/CAD -Continue Crestor -Continue Plavix and Coreg  Tobacco abuse -Counseled on cessation  DVT prophylaxis: Lovenox Code Status: Full Family Communication:  patient will call Disposition Plan:  Home   Discharge Diagnoses:  Principal Problem:   CHF (congestive heart failure) (HCC) Active Problems:   TOBACCO ABUSE   GERD   Essential hypertension, benign   Mixed hyperlipidemia   Coronary atherosclerosis of native coronary artery   COPD (chronic obstructive pulmonary disease) (HCC)   Macrocytic anemia   Persistent atrial fibrillation (HCC)   Hypokalemia   CKD (chronic kidney disease) stage 3, GFR 30-59 ml/min (HCC)   Prolonged QT interval   Elevated brain natriuretic peptide (BNP) level   Prediabetes  Discharge Instructions:  Allergies as of 06/05/2020      Reactions   Penicillins Rash   Has patient had a PCN reaction causing immediate rash, facial/tongue/throat swelling, SOB or lightheadedness with hypotension: Yes Has patient had a PCN reaction causing severe rash involving mucus membranes or skin necrosis: No Has patient had a PCN reaction that required hospitalization No Has patient had a PCN reaction occurring within the last 10 years: No If all of the above answers are "NO", then may proceed with Cephalosporin use.      Medication List    STOP taking these medications   lisinopril 2.5 MG tablet Commonly known as: ZESTRIL     TAKE these  medications   acetaminophen 500 MG tablet Commonly known as: TYLENOL Take 500 mg by mouth every 6 (six) hours as needed for headache.   albuterol (2.5 MG/3ML) 0.083% nebulizer solution Commonly known as: PROVENTIL Take 3 mLs (2.5 mg total) by nebulization every 2 (two) hours as needed for wheezing or shortness of breath.   ALPRAZolam 0.5 MG tablet Commonly known as: XANAX Take 0.5 mg by mouth 2 (two) times daily as needed for anxiety.   carvedilol 12.5 MG tablet Commonly known as: COREG Take 12.5 mg by mouth 2 (two) times daily with a meal.   cetirizine 10 MG tablet Commonly known as: ZYRTEC Take 10 mg by mouth at bedtime.   clopidogrel 75 MG tablet Commonly known as: PLAVIX Take 1 tablet (75 mg total) by mouth every morning.   escitalopram 10 MG tablet Commonly known as: LEXAPRO Take 10 mg by mouth daily. What changed: Another medication with the same name was removed. Continue taking this medication, and follow the directions you see here.   ferrous sulfate 325 (65 FE) MG tablet Take 325 mg by mouth daily with breakfast.   finasteride 5 MG tablet Commonly known as: PROSCAR Take 5 mg by mouth daily.   furosemide 40 MG tablet Commonly known as: LASIX Take 1 tablet (40 mg total) by mouth 2 (two) times daily. What changed: when to  take this   gabapentin 100 MG capsule Commonly known as: NEURONTIN Take 100 mg by mouth at bedtime.   isosorbide mononitrate 30 MG 24 hr tablet Commonly known as: IMDUR Take 0.5 tablets (15 mg total) by mouth daily. What changed:   medication strength  how much to take   Nitrostat 0.4 MG SL tablet Generic drug: nitroGLYCERIN Place 0.4 mg under the tongue every 5 (five) minutes as needed for chest pain.   omeprazole 20 MG capsule Commonly known as: PRILOSEC Take 20 mg by mouth daily.   potassium chloride SA 20 MEQ tablet Commonly known as: KLOR-CON Take 1 tablet (20 mEq total) by mouth daily. Start taking on: June 06, 2020    rosuvastatin 20 MG tablet Commonly known as: CRESTOR Take 1 tablet (20 mg total) by mouth daily.   umeclidinium-vilanterol 62.5-25 MCG/INH Aepb Commonly known as: ANORO ELLIPTA Inhale 1 puff into the lungs daily.            Durable Medical Equipment  (From admission, onward)         Start     Ordered   06/05/20 1039  For home use only DME oxygen  Once       Question Answer Comment  Length of Need Lifetime   Mode or (Route) Nasal cannula   Liters per Minute 2   Frequency Continuous (stationary and portable oxygen unit needed)   Oxygen conserving device Yes   Oxygen delivery system Gas      06/05/20 1039          Follow-up Information    Celene Squibb, MD. Schedule an appointment as soon as possible for a visit in 1 week(s).   Specialty: Internal Medicine Contact information: [address] [place] [facility] [address] [phone]        Satira Sark, MD. Schedule an appointment as soon as possible for a visit in 2 week(s).   Specialty: Cardiology Contact information: [place] [address] [phone]              Allergies  Allergen Reactions   Penicillins Rash    Has patient had a PCN reaction causing immediate rash, facial/tongue/throat swelling, SOB or lightheadedness with hypotension: Yes Has patient had a PCN reaction causing severe rash involving mucus membranes or skin necrosis: No Has patient had a PCN reaction that required hospitalization No Has patient had a PCN reaction occurring within the last 10 years: No If all of the above answers are "NO", then may proceed with Cephalosporin use.    Allergies as of 06/05/2020      Reactions   Penicillins Rash   Has patient had a PCN reaction causing immediate rash, facial/tongue/throat swelling, SOB or lightheadedness with hypotension: Yes Has patient had a PCN reaction causing severe rash involving mucus membranes or skin necrosis: No Has patient had a PCN reaction that  required hospitalization No Has patient had a PCN reaction occurring within the last 10 years: No If all of the above answers are "NO", then may proceed with Cephalosporin use.      Medication List    STOP taking these medications   lisinopril 2.5 MG tablet Commonly known as: ZESTRIL     TAKE these medications   acetaminophen 500 MG tablet Commonly known as: TYLENOL Take 500 mg by mouth every 6 (six) hours as needed for headache.   albuterol (2.5 MG/3ML) 0.083% nebulizer solution Commonly known as: PROVENTIL Take 3 mLs (2.5  mg total) by nebulization every 2 (two) hours as needed for wheezing or shortness of breath.   ALPRAZolam 0.5 MG tablet Commonly known as: XANAX Take 0.5 mg by mouth 2 (two) times daily as needed for anxiety.   carvedilol 12.5 MG tablet Commonly known as: COREG Take 12.5 mg by mouth 2 (two) times daily with a meal.   cetirizine 10 MG tablet Commonly known as: ZYRTEC Take 10 mg by mouth at bedtime.   clopidogrel 75 MG tablet Commonly known as: PLAVIX Take 1 tablet (75 mg total) by mouth every morning.   escitalopram 10 MG tablet Commonly known as: LEXAPRO Take 10 mg by mouth daily. What changed: Another medication with the same name was removed. Continue taking this medication, and follow the directions you see here.   ferrous sulfate 325 (65 FE) MG tablet Take 325 mg by mouth daily with breakfast.   finasteride 5 MG tablet Commonly known as: PROSCAR Take 5 mg by mouth daily.   furosemide 40 MG tablet Commonly known as: LASIX Take 1 tablet (40 mg total) by mouth 2 (two) times daily. What changed: when to take this   gabapentin 100 MG capsule Commonly known as: NEURONTIN Take 100 mg by mouth at bedtime.   isosorbide mononitrate 30 MG 24 hr tablet Commonly known as: IMDUR Take 0.5 tablets (15 mg total) by mouth daily. What changed:   medication strength  how much to take   Nitrostat 0.4 MG SL tablet Generic drug:  nitroGLYCERIN Place 0.4 mg under the tongue every 5 (five) minutes as needed for chest pain.   omeprazole 20 MG capsule Commonly known as: PRILOSEC Take 20 mg by mouth daily.   potassium chloride SA 20 MEQ tablet Commonly known as: KLOR-CON Take 1 tablet (20 mEq total) by mouth daily. Start taking on: June 06, 2020   rosuvastatin 20 MG tablet Commonly known as: CRESTOR Take 1 tablet (20 mg total) by mouth daily.   umeclidinium-vilanterol 62.5-25 MCG/INH Aepb Commonly known as: ANORO ELLIPTA Inhale 1 puff into the lungs daily.            Durable Medical Equipment  (From admission, onward)         Start     Ordered   06/05/20 1039  For home use only DME oxygen  Once       Question Answer Comment  Length of Need Lifetime   Mode or (Route) Nasal cannula   Liters per Minute 2   Frequency Continuous (stationary and portable oxygen unit needed)   Oxygen conserving device Yes   Oxygen delivery system Gas      06/05/20 1039          Procedures/Studies: DG Chest Port 1 View  Result Date: 06/03/2020 CLINICAL DATA:  Short of breath for 2 weeks, lower extremity edema EXAM: PORTABLE CHEST 1 VIEW COMPARISON:  10/29/2015 FINDINGS: Single frontal view of the chest demonstrates enlarged cardiac silhouette. Postsurgical changes from CABG. There is increased central vascular congestion, with bibasilar airspace disease left greater than right. Bilateral pleural effusions are identified, left greater than right. No pneumothorax. IMPRESSION: 1. Findings most consistent with congestive heart failure. Electronically Signed   By: Randa Ngo M.D.   On: 06/03/2020 03:07   ECHOCARDIOGRAM COMPLETE  Result Date: 06/03/2020    ECHOCARDIOGRAM REPORT   Patient Name:   Donald Gaines Date of Exam: 06/03/2020 Medical Rec #:  [id number]        Height:  67.0 in Accession #:    [id number]       Weight:       189.8 lb Date of Birth:  07/06/1940       BSA:          1.978 m Patient Age:    37  years         BP:           106/88 mmHg Patient Gender: M                HR:           71 bpm. Exam Location:  [facility] Procedure: 2D Echo Indications:    CHF-Acute Diastolic H41.74  History:        Patient has prior history of Echocardiogram examinations, most                 recent 02/10/2017. CHF, COPD; Risk Factors:Current Smoker,                 Diabetes and Hypertension. GERD.  Sonographer:    Leavy Cella RDCS (AE) Referring Phys: [id number] OLADAPO ADEFESO IMPRESSIONS  1. Poor acoustic window and ectopy make evaluation of LVEF difficult, even with the use of Defiinity. Left ventricular ejection fraction, by estimation, is 60 to 65%. There is moderate left ventricular hypertrophy. Left ventricular diastolic parameters are indeterminate.  2. Right ventricular systolic function is normal. The right ventricular size is normal.  3. Left atrial size was severely dilated.  4. Right atrial size was mildly dilated.  5. The mitral valve is normal in structure. Trivial mitral valve regurgitation.  6. Aortic valve regurgitation is not visualized. Mild to moderate aortic valve sclerosis/calcification is present, without any evidence of aortic stenosis.  7. The inferior vena cava is normal in size with greater than 50% respiratory variability, suggesting right atrial pressure of 3 mmHg. FINDINGS  Left Ventricle: Poor acoustic window and ectopy make evaluation of LVEF difficult, even with the use of Defiinity. Left ventricular ejection fraction, by estimation, is 60 to 65%. The left ventricle has normal function. The left ventricular internal cavity size was normal in size. There is moderate left ventricular hypertrophy. Left ventricular diastolic parameters are indeterminate. Right Ventricle: The right ventricular size is normal. Right vetricular wall thickness was not assessed. Right ventricular systolic function is normal. Left Atrium: Left atrial size was severely dilated. Right Atrium: Right atrial size was mildly  dilated. Pericardium: There is no evidence of pericardial effusion. Mitral Valve: The mitral valve is normal in structure. Trivial mitral valve regurgitation. Tricuspid Valve: The tricuspid valve is grossly normal. Tricuspid valve regurgitation is mild. Aortic Valve: Aortic valve regurgitation is not visualized. Mild to moderate aortic valve sclerosis/calcification is present, without any evidence of aortic stenosis. Pulmonic Valve: The pulmonic valve was grossly normal. Pulmonic valve regurgitation is trivial. Aorta: The aortic root is normal in size and structure. Venous: The inferior vena cava is normal in size with greater than 50% respiratory variability, suggesting right atrial pressure of 3 mmHg. IAS/Shunts: The interatrial septum was not assessed.  LEFT VENTRICLE PLAX 2D LVIDd:         4.16 cm  Diastology LVIDs:         2.62 cm  LV e' medial:    3.92 cm/s LV PW:         1.39 cm  LV E/e' medial:  22.9 LV IVS:        1.26 cm  LV e' lateral:  6.60 cm/s LVOT diam:     2.00 cm  LV E/e' lateral: 13.6 LVOT Area:     3.14 cm  RIGHT VENTRICLE RV S prime:     7.01 cm/s TAPSE (M-mode): 1.6 cm LEFT ATRIUM             Index       RIGHT ATRIUM           Index LA diam:        4.90 cm 2.48 cm/m  RA Area:     16.90 cm LA Vol (A2C):   79.2 ml 40.04 ml/m RA Volume:   45.80 ml  23.16 ml/m LA Vol (A4C):   88.4 ml 44.70 ml/m LA Biplane Vol: 90.9 ml 45.96 ml/m   AORTA Ao Root diam: 3.00 cm MITRAL VALVE               TRICUSPID VALVE MV Area (PHT): 2.36 cm    TR Peak grad:   26.2 mmHg MV Decel Time: 322 msec    TR Vmax:        256.00 cm/s MV E velocity: 89.70 cm/s                            SHUNTS                            Systemic Diam: 2.00 cm Dorris Carnes MD Electronically signed by Dorris Carnes MD Signature Date/Time: 06/03/2020/9:40:42 PM    Final       Subjective: Pt reports that he has been feeling much better. He says that he will try to take his meds as prescribed this time.  He has diuresed well.  He is agreeable  to going home.    Discharge Exam: Vitals:   06/05/20 0932 06/05/20 1000  BP: (!) 98/50 102/61  Pulse: 75   Resp:    Temp:    SpO2:     Vitals:   06/05/20 0515 06/05/20 0736 06/05/20 0932 06/05/20 1000  BP: 137/67  (!) 98/50 102/61  Pulse: 75  75   Resp: 20     Temp: 97.8 F (36.6 C)     TempSrc: Oral     SpO2: 95% 95%    Weight:      Height:       General: Pt is alert, awake, not in acute distress Cardiovascular: normal S1/S2 +, no rubs, no gallops Respiratory: CTA bilaterally, no wheezing, no rhonchi Abdominal: Soft, NT, ND, bowel sounds + Extremities: trace pretibial edema, no cyanosis   The results of significant diagnostics from this hospitalization (including imaging, microbiology, ancillary and laboratory) are listed below for reference.     Microbiology: Recent Results (from the past 240 hour(s))  Resp Panel by RT-PCR (Flu A&B, Covid) Nasopharyngeal Swab     Status: None   Collection Time: 06/03/20  2:47 AM   Specimen: Nasopharyngeal Swab; Nasopharyngeal(NP) swabs in vial transport medium  Result Value Ref Range Status   SARS Coronavirus 2 by RT PCR NEGATIVE NEGATIVE Final    Comment: (NOTE) SARS-CoV-2 target nucleic acids are NOT DETECTED.  The SARS-CoV-2 RNA is generally detectable in upper respiratory specimens during the acute phase of infection. The lowest concentration of SARS-CoV-2 viral copies this assay can detect is 138 copies/mL. A negative result does not preclude SARS-Cov-2 infection and should not be used as the sole basis for treatment or other patient  management decisions. A negative result may occur with  improper specimen collection/handling, submission of specimen other than nasopharyngeal swab, presence of viral mutation(s) within the areas targeted by this assay, and inadequate number of viral copies(<138 copies/mL). A negative result must be combined with clinical observations, patient history, and epidemiological information. The  expected result is Negative.  Fact Sheet for Patients:  EntrepreneurPulse.com.au  Fact Sheet for Healthcare Providers:  IncredibleEmployment.be  This test is no t yet approved or cleared by the [place] FDA and  has been authorized for detection and/or diagnosis of SARS-CoV-2 by FDA under an Emergency Use Authorization (EUA). This EUA will remain  in effect (meaning this test can be used) for the duration of the COVID-19 declaration under Section 564(b)(1) of the Act, 21 U.S.C.section 360bbb-3(b)(1), unless the authorization is terminated  or revoked sooner.       Influenza A by PCR NEGATIVE NEGATIVE Final   Influenza B by PCR NEGATIVE NEGATIVE Final    Comment: (NOTE) The Xpert Xpress SARS-CoV-2/FLU/RSV plus assay is intended as an aid in the diagnosis of influenza from Nasopharyngeal swab specimens and should not be used as a sole basis for treatment. Nasal washings and aspirates are unacceptable for Xpert Xpress SARS-CoV-2/FLU/RSV testing.  Fact Sheet for Patients: EntrepreneurPulse.com.au  Fact Sheet for Healthcare Providers: IncredibleEmployment.be  This test is not yet approved or cleared by the [place] FDA and has been authorized for detection and/or diagnosis of SARS-CoV-2 by FDA under an Emergency Use Authorization (EUA). This EUA will remain in effect (meaning this test can be used) for the duration of the COVID-19 declaration under Section 564(b)(1) of the Act, 21 U.S.C. section 360bbb-3(b)(1), unless the authorization is terminated or revoked.  Performed at [facility], [address], [place], [place] [address]      Labs: BNP (last 3 results) Recent Labs    06/03/20 0238  BNP 259.5*   Basic Metabolic Panel: Recent Labs  Lab 06/03/20 0237 06/03/20 0439 06/04/20 0646 06/05/20 0610  NA 143  --  142 141  K 2.9*  --  3.8 4.2  CL 105  --  104 102  CO2 29  --  28 29   GLUCOSE 124*  --  116* 125*  BUN 13  --  17 23  CREATININE 1.78*  --  1.68* 1.85*  CALCIUM 8.3*  --  8.4* 9.1  MG  --  2.4 2.2 2.3  PHOS  --  3.3  --   --    Liver Function Tests: Recent Labs  Lab 06/04/20 0646  AST 15  ALT 10  ALKPHOS 73  BILITOT 0.8  PROT 6.4*  ALBUMIN 3.0*   No results for input(s): LIPASE, AMYLASE in the last 168 hours. No results for input(s): AMMONIA in the last 168 hours. CBC: Recent Labs  Lab 06/03/20 0237  WBC 6.0  NEUTROABS 4.5  HGB 11.6*  HCT 39.1  MCV 107.7*  PLT 179   Cardiac Enzymes: No results for input(s): CKTOTAL, CKMB, CKMBINDEX, TROPONINI in the last 168 hours. BNP: Invalid input(s): POCBNP CBG: No results for input(s): GLUCAP in the last 168 hours. D-Dimer No results for input(s): DDIMER in the last 72 hours. Hgb A1c Recent Labs    06/03/20 0439  HGBA1C 6.1*   Lipid Profile No results for input(s): CHOL, HDL, LDLCALC, TRIG, CHOLHDL, LDLDIRECT in the last 72 hours. Thyroid function studies No results for input(s): TSH, T4TOTAL, T3FREE, THYROIDAB in the last 72 hours.  Invalid input(s): FREET3 Anemia  work up Recent Labs    06/03/20 0554 06/03/20 0555  VITAMINB12 279  --   FOLATE  --  6.9   Urinalysis    Component Value Date/Time   COLORURINE YELLOW 07/27/2015 1020   APPEARANCEUR CLEAR 07/27/2015 1020   LABSPEC 1.010 07/27/2015 1020   PHURINE 6.0 07/27/2015 1020   GLUCOSEU NEGATIVE 07/27/2015 1020   HGBUR MODERATE (A) 07/27/2015 1020   BILIRUBINUR NEGATIVE 07/27/2015 1020   KETONESUR NEGATIVE 07/27/2015 1020   PROTEINUR 100 (A) 07/27/2015 1020   UROBILINOGEN 1.0 12/25/2014 2210   NITRITE NEGATIVE 07/27/2015 1020   LEUKOCYTESUR NEGATIVE 07/27/2015 1020   Sepsis Labs Invalid input(s): PROCALCITONIN,  WBC,  LACTICIDVEN Microbiology Recent Results (from the past 240 hour(s))  Resp Panel by RT-PCR (Flu A&B, Covid) Nasopharyngeal Swab     Status: None   Collection Time: 06/03/20  2:47 AM   Specimen:  Nasopharyngeal Swab; Nasopharyngeal(NP) swabs in vial transport medium  Result Value Ref Range Status   SARS Coronavirus 2 by RT PCR NEGATIVE NEGATIVE Final    Comment: (NOTE) SARS-CoV-2 target nucleic acids are NOT DETECTED.  The SARS-CoV-2 RNA is generally detectable in upper respiratory specimens during the acute phase of infection. The lowest concentration of SARS-CoV-2 viral copies this assay can detect is 138 copies/mL. A negative result does not preclude SARS-Cov-2 infection and should not be used as the sole basis for treatment or other patient management decisions. A negative result may occur with  improper specimen collection/handling, submission of specimen other than nasopharyngeal swab, presence of viral mutation(s) within the areas targeted by this assay, and inadequate number of viral copies(<138 copies/mL). A negative result must be combined with clinical observations, patient history, and epidemiological information. The expected result is Negative.  Fact Sheet for Patients:  EntrepreneurPulse.com.au  Fact Sheet for Healthcare Providers:  IncredibleEmployment.be  This test is no t yet approved or cleared by the [place] FDA and  has been authorized for detection and/or diagnosis of SARS-CoV-2 by FDA under an Emergency Use Authorization (EUA). This EUA will remain  in effect (meaning this test can be used) for the duration of the COVID-19 declaration under Section 564(b)(1) of the Act, 21 U.S.C.section 360bbb-3(b)(1), unless the authorization is terminated  or revoked sooner.       Influenza A by PCR NEGATIVE NEGATIVE Final   Influenza B by PCR NEGATIVE NEGATIVE Final    Comment: (NOTE) The Xpert Xpress SARS-CoV-2/FLU/RSV plus assay is intended as an aid in the diagnosis of influenza from Nasopharyngeal swab specimens and should not be used as a sole basis for treatment. Nasal washings and aspirates are unacceptable for  Xpert Xpress SARS-CoV-2/FLU/RSV testing.  Fact Sheet for Patients: EntrepreneurPulse.com.au  Fact Sheet for Healthcare Providers: IncredibleEmployment.be  This test is not yet approved or cleared by the [place] FDA and has been authorized for detection and/or diagnosis of SARS-CoV-2 by FDA under an Emergency Use Authorization (EUA). This EUA will remain in effect (meaning this test can be used) for the duration of the COVID-19 declaration under Section 564(b)(1) of the Act, 21 U.S.C. section 360bbb-3(b)(1), unless the authorization is terminated or revoked.  Performed at [facility], [address], [place], [place] [address]    Time coordinating discharge: 35 mins   SIGNED:  Irwin Brakeman, MD  [facility] 06/05/2020, 11:02 AM How to contact the [facility] Attending or Consulting provider [place] or covering provider during after hours [place], for this patient?  1. Check the care team in  [facility] and look for a) attending/consulting [facility] provider listed and b) the [facility] team listed 2. Log into [email or web address] and use [place]'s universal password to access. If you do not have the password, please contact the hospital operator. 3. Locate the [facility] provider you are looking for under [facility] and page to a number that you can be directly reached. 4. If you still have difficulty reaching the provider, please page the [facility] (Director on Call) for the Hospitalists listed on amion for assistance.

## 2020-06-17 DIAGNOSIS — Z0001 Encounter for general adult medical examination with abnormal findings: Secondary | ICD-10-CM | POA: Diagnosis not present

## 2020-06-17 DIAGNOSIS — E1140 Type 2 diabetes mellitus with diabetic neuropathy, unspecified: Secondary | ICD-10-CM | POA: Diagnosis not present

## 2020-06-17 DIAGNOSIS — I10 Essential (primary) hypertension: Secondary | ICD-10-CM | POA: Diagnosis not present

## 2020-06-17 DIAGNOSIS — J449 Chronic obstructive pulmonary disease, unspecified: Secondary | ICD-10-CM | POA: Diagnosis not present

## 2020-06-17 DIAGNOSIS — D631 Anemia in chronic kidney disease: Secondary | ICD-10-CM | POA: Diagnosis not present

## 2020-06-17 DIAGNOSIS — E876 Hypokalemia: Secondary | ICD-10-CM | POA: Diagnosis not present

## 2020-06-17 DIAGNOSIS — Z716 Tobacco abuse counseling: Secondary | ICD-10-CM | POA: Diagnosis not present

## 2020-06-17 DIAGNOSIS — L299 Pruritus, unspecified: Secondary | ICD-10-CM | POA: Diagnosis not present

## 2020-06-17 DIAGNOSIS — J329 Chronic sinusitis, unspecified: Secondary | ICD-10-CM | POA: Diagnosis not present

## 2020-06-17 DIAGNOSIS — J310 Chronic rhinitis: Secondary | ICD-10-CM | POA: Diagnosis not present

## 2020-06-17 DIAGNOSIS — R531 Weakness: Secondary | ICD-10-CM | POA: Diagnosis not present

## 2020-06-17 DIAGNOSIS — R69 Illness, unspecified: Secondary | ICD-10-CM | POA: Diagnosis not present

## 2020-06-17 DIAGNOSIS — I5023 Acute on chronic systolic (congestive) heart failure: Secondary | ICD-10-CM | POA: Diagnosis not present

## 2020-06-17 DIAGNOSIS — K279 Peptic ulcer, site unspecified, unspecified as acute or chronic, without hemorrhage or perforation: Secondary | ICD-10-CM | POA: Diagnosis not present

## 2020-06-17 DIAGNOSIS — I4821 Permanent atrial fibrillation: Secondary | ICD-10-CM | POA: Diagnosis not present

## 2020-06-17 DIAGNOSIS — R809 Proteinuria, unspecified: Secondary | ICD-10-CM | POA: Diagnosis not present

## 2020-06-17 DIAGNOSIS — M19049 Primary osteoarthritis, unspecified hand: Secondary | ICD-10-CM | POA: Diagnosis not present

## 2020-07-02 DIAGNOSIS — R2681 Unsteadiness on feet: Secondary | ICD-10-CM | POA: Diagnosis not present

## 2020-07-02 DIAGNOSIS — R531 Weakness: Secondary | ICD-10-CM | POA: Diagnosis not present

## 2020-07-06 DIAGNOSIS — J449 Chronic obstructive pulmonary disease, unspecified: Secondary | ICD-10-CM | POA: Diagnosis not present

## 2020-07-11 DIAGNOSIS — E782 Mixed hyperlipidemia: Secondary | ICD-10-CM | POA: Diagnosis not present

## 2020-07-11 DIAGNOSIS — D631 Anemia in chronic kidney disease: Secondary | ICD-10-CM | POA: Diagnosis not present

## 2020-07-11 DIAGNOSIS — R5382 Chronic fatigue, unspecified: Secondary | ICD-10-CM | POA: Diagnosis not present

## 2020-07-11 DIAGNOSIS — D509 Iron deficiency anemia, unspecified: Secondary | ICD-10-CM | POA: Diagnosis not present

## 2020-07-11 DIAGNOSIS — I10 Essential (primary) hypertension: Secondary | ICD-10-CM | POA: Diagnosis not present

## 2020-07-11 DIAGNOSIS — E1140 Type 2 diabetes mellitus with diabetic neuropathy, unspecified: Secondary | ICD-10-CM | POA: Diagnosis not present

## 2020-07-11 DIAGNOSIS — M19049 Primary osteoarthritis, unspecified hand: Secondary | ICD-10-CM | POA: Diagnosis not present

## 2020-07-11 DIAGNOSIS — M1990 Unspecified osteoarthritis, unspecified site: Secondary | ICD-10-CM | POA: Diagnosis not present

## 2020-07-11 DIAGNOSIS — N1832 Chronic kidney disease, stage 3b: Secondary | ICD-10-CM | POA: Diagnosis not present

## 2020-07-11 DIAGNOSIS — I4821 Permanent atrial fibrillation: Secondary | ICD-10-CM | POA: Diagnosis not present

## 2020-07-11 DIAGNOSIS — E1122 Type 2 diabetes mellitus with diabetic chronic kidney disease: Secondary | ICD-10-CM | POA: Diagnosis not present

## 2020-07-15 ENCOUNTER — Encounter: Payer: Self-pay | Admitting: Orthopaedic Surgery

## 2020-07-15 DIAGNOSIS — E1122 Type 2 diabetes mellitus with diabetic chronic kidney disease: Secondary | ICD-10-CM | POA: Diagnosis not present

## 2020-07-15 DIAGNOSIS — N1832 Chronic kidney disease, stage 3b: Secondary | ICD-10-CM | POA: Diagnosis not present

## 2020-07-15 DIAGNOSIS — I2510 Atherosclerotic heart disease of native coronary artery without angina pectoris: Secondary | ICD-10-CM | POA: Diagnosis not present

## 2020-07-15 DIAGNOSIS — R809 Proteinuria, unspecified: Secondary | ICD-10-CM | POA: Diagnosis not present

## 2020-07-15 DIAGNOSIS — J449 Chronic obstructive pulmonary disease, unspecified: Secondary | ICD-10-CM | POA: Diagnosis not present

## 2020-07-15 DIAGNOSIS — I4821 Permanent atrial fibrillation: Secondary | ICD-10-CM | POA: Diagnosis not present

## 2020-07-15 DIAGNOSIS — E1140 Type 2 diabetes mellitus with diabetic neuropathy, unspecified: Secondary | ICD-10-CM | POA: Diagnosis not present

## 2020-07-15 DIAGNOSIS — R69 Illness, unspecified: Secondary | ICD-10-CM | POA: Diagnosis not present

## 2020-07-15 DIAGNOSIS — J9611 Chronic respiratory failure with hypoxia: Secondary | ICD-10-CM | POA: Diagnosis not present

## 2020-07-15 DIAGNOSIS — D631 Anemia in chronic kidney disease: Secondary | ICD-10-CM | POA: Diagnosis not present

## 2020-08-05 ENCOUNTER — Ambulatory Visit: Payer: Medicare HMO | Admitting: Orthopedic Surgery

## 2020-08-05 DIAGNOSIS — J449 Chronic obstructive pulmonary disease, unspecified: Secondary | ICD-10-CM | POA: Diagnosis not present

## 2020-08-21 NOTE — Progress Notes (Signed)
Cardiology Office Note  Date: 08/22/2020   ID: Donald Gaines, DOB 07/04/1940, MRN [id number]  PCP:  Celene Squibb, MD  Cardiologist:  Rozann Lesches, MD Electrophysiologist:  None   Chief Complaint  Patient presents with  . Cardiac follow-up    History of Present Illness: Donald Gaines is a 80 y.o. male last seen in December 2021.  He is here for a follow-up visit.  He was hospitalized in March with shortness of breath and acute on chronic hypoxic respiratory failure.  He was felt to have acute on chronic diastolic heart failure and treated with IV Lasix with clinical improvement.  He had apparently not been taking his Lasix twice daily at home.  Follow-up echocardiogram is noted below.  He does not report any angina symptoms.  Mostly, having trouble with seasonal allergies and cough with COPD recently.  He is not anticoagulated for atrial fibrillation with prior history of GI bleeding and AVMs.  Past Medical History:  Diagnosis Date  . Arteriovenous malformation small bowel    Capsule study 3/10  . Bilateral carotid artery stenosis   . BPH (benign prostatic hypertrophy)   . Candida esophagitis ([place])   . Cataract   . Chronic anemia   . Chronic diarrhea   . COPD (chronic obstructive pulmonary disease) ([place])   . Coronary atherosclerosis of native coronary artery    Multivessel, BMS SVG TO RCA 2000, reportedly  "2" additional stents in interim  . Dementia ([place])   . Depression   . Diabetes mellitus, type 2 ([place])   . Essential hypertension   . GERD (gastroesophageal reflux disease)   . GI bleeding    Cecal ulcers, arteriovenous malformations  . Glaucoma   . Hemorrhoids   . Hyperlipidemia   . Myocardial infarction ([place])    1994  . Portal hypertensive gastropathy ([place]) 11/08/2012   EGD. Dr. Laural Golden  . Restrictive lung disease   . Sleep apnea   . TIA (transient ischemic attack)    2005    Past Surgical History:  Procedure Laterality Date  . CAPSULE ENDO   03/10  . cardiac catherization  04/2012  . CARDIAC CATHETERIZATION  04/05/1996 [name]   EF 45%, occluded SVG to circumflex, patent SVG to D1 and PDA and patent LIMA to LAD with severe native vessel disease.  Marland Kitchen CARDIAC CATHETERIZATION  08/22/1998 [name]   Mild LM, severe LAD,severe CX, occlude RCA, patient  SVG to diatal RCA, patent vein graft to D1 and patent LIMA to LAD.   Marland Kitchen CATARACT EXTRACTION W/PHACO Right 09/22/2012   Procedure: CATARACT EXTRACTION PHACO AND INTRAOCULAR LENS PLACEMENT (IOC);  Surgeon: Tonny Branch, MD;  Location: [facility];  Service: Ophthalmology;  Laterality: Right;  CDE: 11.43  . CATARACT EXTRACTION W/PHACO Left 10/17/2012   Procedure: CATARACT EXTRACTION PHACO AND INTRAOCULAR LENS PLACEMENT (IOC);  Surgeon: Tonny Branch, MD;  Location: [facility];  Service: Ophthalmology;  Laterality: Left;  CDE: 8.06  . COLONOSCOPY  APR 2008   SIMPLE ADENOMA, [place] TICS, IH  . COLONOSCOPY  FEB 2008 ANEMIA. MELENA    2 LARGE ILEOCEAL ULCERS 2o to ASA, [place] TICS, IH  . COLONOSCOPY  SEP 2009 TRANSFUSION DEP ANEMIA   AC AVM-ABLATED, [place] TICS, IH  . COLONOSCOPY WITH ESOPHAGOGASTRODUODENOSCOPY (EGD) N/A 11/08/2012   Procedure: COLONOSCOPY WITH ESOPHAGOGASTRODUODENOSCOPY (EGD);  Surgeon: Rogene Houston, MD;  [facility];  Service: Endoscopy;  Laterality: N/A;  250-rescheduled to 7:30 Ann to notify pt  . CORONARY  ANGIOPLASTY WITH STENT PLACEMENT  08/22/1998 [name]   TEC stenting of the SVG to RCA-Last seen by cardiologist in 2010.  Marland Kitchen CORONARY ARTERY BYPASS GRAFT  1995-triple bypass   LIMA-LAD, LIMA to intermediate branch, SVG to PD and second OM,  . ESOPHAGOGASTRODUODENOSCOPY  SEP 09   DUODENAL LIPOMA  . ESOPHAGOGASTRODUODENOSCOPY N/A 09/27/2013   Procedure: ESOPHAGOGASTRODUODENOSCOPY (EGD) Push enteroscopy;  Surgeon: Rogene Houston, MD;  [facility];  Service: Endoscopy;  Laterality: N/A;  . GIVENS CAPSULE STUDY N/A 11/24/2012   Procedure: GIVENS CAPSULE STUDY;   Surgeon: Rogene Houston, MD;  Location: [facility];  Service: Endoscopy;  Laterality: N/A;  730  . HOT HEMOSTASIS  09/27/2013   Procedure: HOT HEMOSTASIS (ARGON PLASMA COAGULATION/BICAP);  Surgeon: Rogene Houston, MD;  Location: [facility];  Service: Endoscopy;;  . HYDROGEN BREATH TEST  2009  . RIGHT INGUINAL HERNIA REPAIR    . UPPER GASTROINTESTINAL ENDOSCOPY  SEP 2009   GASTRIC AVM ABLATED, NL DUO Bx    Current Outpatient Medications  Medication Sig Dispense Refill  . acetaminophen (TYLENOL) 500 MG tablet Take 500 mg by mouth every 6 (six) hours as needed for headache.    . albuterol (PROVENTIL) (2.5 MG/3ML) 0.083% nebulizer solution Take 3 mLs (2.5 mg total) by nebulization every 2 (two) hours as needed for wheezing or shortness of breath. 75 mL 12  . ALPRAZolam (XANAX) 0.5 MG tablet Take 0.5 mg by mouth 2 (two) times daily as needed for anxiety.    . carvedilol (COREG) 12.5 MG tablet Take 12.5 mg by mouth 2 (two) times daily with a meal.     . cetirizine (ZYRTEC) 10 MG tablet Take 10 mg by mouth at bedtime.     . clopidogrel (PLAVIX) 75 MG tablet Take 1 tablet (75 mg total) by mouth every morning.    . escitalopram (LEXAPRO) 10 MG tablet Take 10 mg by mouth daily.    . ferrous sulfate 325 (65 FE) MG tablet Take 325 mg by mouth daily with breakfast.    . finasteride (PROSCAR) 5 MG tablet Take 5 mg by mouth daily.    . furosemide (LASIX) 40 MG tablet Take 1 tablet (40 mg total) by mouth 2 (two) times daily. 30 tablet   . lisinopril (ZESTRIL) 2.5 MG tablet Take 2.5 mg by mouth daily.    . metFORMIN (GLUCOPHAGE) 500 MG tablet Take 500 mg by mouth daily with breakfast.    . NITROSTAT 0.4 MG SL tablet Place 0.4 mg under the tongue every 5 (five) minutes as needed for chest pain.     Marland Kitchen omeprazole (PRILOSEC) 20 MG capsule Take 20 mg by mouth daily.     . rosuvastatin (CRESTOR) 20 MG tablet Take 1 tablet (20 mg total) by mouth daily. 90 tablet 0  . umeclidinium-vilanterol (ANORO ELLIPTA)  62.5-25 MCG/INH AEPB Inhale 1 puff into the lungs daily.     No current facility-administered medications for this visit.   Allergies:  Penicillins   ROS: Intermittent headaches.  Physical Exam: VS:  BP (!) 110/52   Pulse 80   Ht 5\' 8"  (1.727 m)   Wt 198 lb (89.8 kg)   SpO2 93%   BMI 30.11 kg/m , BMI Body mass index is 30.11 kg/m.  Wt Readings from Last 3 Encounters:  08/22/20 198 lb (89.8 kg)  06/05/20 190 lb 0.6 oz (86.2 kg)  03/04/20 182 lb 12.8 oz (82.9 kg)    General: Patient appears comfortable at  rest. HEENT: Conjunctiva and lids normal, wearing a mask. Neck: Supple, no elevated JVP or carotid bruits, no thyromegaly. Lungs: Clear to auscultation, nonlabored breathing at rest. Cardiac: Irregular, no S3, 1/6 systolic murmur, no pericardial rub. Extremities: No pitting edema.  ECG:  An ECG dated 06/04/2020 was personally reviewed today and demonstrated:  Atrial fibrillation with PVCs, nonspecific ST-T changes.  Recent Labwork: 06/03/2020: B Natriuretic Peptide 439.0; Hemoglobin 11.6; Platelets 179 06/04/2020: ALT 10; AST 15 06/05/2020: BUN 23; Creatinine, Ser 1.85; Magnesium 2.3; Potassium 4.2; Sodium 141   Other Studies Reviewed Today:  Echocardiogram 06/03/2020: 1. Poor acoustic window and ectopy make evaluation of LVEF difficult,  even with the use of Defiinity. Left ventricular ejection fraction, by  estimation, is 60 to 65%. There is moderate left ventricular hypertrophy.  Left ventricular diastolic parameters  are indeterminate.  2. Right ventricular systolic function is normal. The right ventricular  size is normal.  3. Left atrial size was severely dilated.  4. Right atrial size was mildly dilated.  5. The mitral valve is normal in structure. Trivial mitral valve  regurgitation.  6. Aortic valve regurgitation is not visualized. Mild to moderate aortic  valve sclerosis/calcification is present, without any evidence of aortic  stenosis.  7. The  inferior vena cava is normal in size with greater than 50%  respiratory variability, suggesting right atrial pressure of 3 mmHg.   Assessment and Plan:  1.  Permanent atrial fibrillation with CHA2DS2-VASc score of 6.  He has a history of recurrent GI bleeding and AVMs, not anticoagulated long-term.  He is asymptomatic in terms of palpitations and has good heart rate control on Coreg which we will continue at current dose.  2.  CAD status post CABG and PCI as discussed above.  He does not report any active angina at this time, LVEF remains normal at 60 to 65% by most recent follow-up echocardiogram.  Continue Plavix, Coreg, lisinopril, Coreg, and as needed nitroglycerin.  Medication Adjustments/Labs and Tests Ordered: Current medicines are reviewed at length with the patient today.  Concerns regarding medicines are outlined above.   Tests Ordered: No orders of the defined types were placed in this encounter.   Medication Changes: No orders of the defined types were placed in this encounter.   Disposition:  Follow up 6 months.  Signed, Satira Sark, MD, [facility] 08/22/2020 12:07 PM    [place] [facility] [place], [place], [place] [address] Phone: [phone]; Fax: [phone]

## 2020-08-22 ENCOUNTER — Encounter: Payer: Self-pay | Admitting: Cardiology

## 2020-08-22 ENCOUNTER — Ambulatory Visit (INDEPENDENT_AMBULATORY_CARE_PROVIDER_SITE_OTHER): Payer: Medicare HMO | Admitting: Cardiology

## 2020-08-22 VITALS — BP 110/52 | HR 80 | Ht 68.0 in | Wt 198.0 lb

## 2020-08-22 DIAGNOSIS — I25119 Atherosclerotic heart disease of native coronary artery with unspecified angina pectoris: Secondary | ICD-10-CM | POA: Diagnosis not present

## 2020-08-22 DIAGNOSIS — I4821 Permanent atrial fibrillation: Secondary | ICD-10-CM | POA: Diagnosis not present

## 2020-08-22 NOTE — Patient Instructions (Addendum)

## 2020-09-05 DIAGNOSIS — J449 Chronic obstructive pulmonary disease, unspecified: Secondary | ICD-10-CM | POA: Diagnosis not present

## 2020-10-05 DIAGNOSIS — J449 Chronic obstructive pulmonary disease, unspecified: Secondary | ICD-10-CM | POA: Diagnosis not present

## 2020-10-29 ENCOUNTER — Ambulatory Visit (INDEPENDENT_AMBULATORY_CARE_PROVIDER_SITE_OTHER): Payer: Medicare HMO | Admitting: Internal Medicine

## 2020-11-05 DIAGNOSIS — J449 Chronic obstructive pulmonary disease, unspecified: Secondary | ICD-10-CM | POA: Diagnosis not present

## 2020-12-06 DIAGNOSIS — J449 Chronic obstructive pulmonary disease, unspecified: Secondary | ICD-10-CM | POA: Diagnosis not present

## 2020-12-13 DIAGNOSIS — E1165 Type 2 diabetes mellitus with hyperglycemia: Secondary | ICD-10-CM | POA: Diagnosis not present

## 2020-12-13 DIAGNOSIS — R69 Illness, unspecified: Secondary | ICD-10-CM | POA: Diagnosis not present

## 2020-12-13 DIAGNOSIS — Z0001 Encounter for general adult medical examination with abnormal findings: Secondary | ICD-10-CM | POA: Diagnosis not present

## 2020-12-13 DIAGNOSIS — I10 Essential (primary) hypertension: Secondary | ICD-10-CM | POA: Diagnosis not present

## 2020-12-13 DIAGNOSIS — Z125 Encounter for screening for malignant neoplasm of prostate: Secondary | ICD-10-CM | POA: Diagnosis not present

## 2020-12-13 DIAGNOSIS — J9611 Chronic respiratory failure with hypoxia: Secondary | ICD-10-CM | POA: Diagnosis not present

## 2020-12-13 DIAGNOSIS — I4821 Permanent atrial fibrillation: Secondary | ICD-10-CM | POA: Diagnosis not present

## 2020-12-13 DIAGNOSIS — I2510 Atherosclerotic heart disease of native coronary artery without angina pectoris: Secondary | ICD-10-CM | POA: Diagnosis not present

## 2020-12-13 DIAGNOSIS — J449 Chronic obstructive pulmonary disease, unspecified: Secondary | ICD-10-CM | POA: Diagnosis not present

## 2020-12-13 DIAGNOSIS — D631 Anemia in chronic kidney disease: Secondary | ICD-10-CM | POA: Diagnosis not present

## 2020-12-13 DIAGNOSIS — N1832 Chronic kidney disease, stage 3b: Secondary | ICD-10-CM | POA: Diagnosis not present

## 2021-01-05 DIAGNOSIS — J449 Chronic obstructive pulmonary disease, unspecified: Secondary | ICD-10-CM | POA: Diagnosis not present

## 2021-02-05 DIAGNOSIS — J449 Chronic obstructive pulmonary disease, unspecified: Secondary | ICD-10-CM | POA: Diagnosis not present

## 2021-02-27 ENCOUNTER — Ambulatory Visit: Payer: Medicare HMO | Admitting: Cardiology

## 2021-03-07 DIAGNOSIS — J449 Chronic obstructive pulmonary disease, unspecified: Secondary | ICD-10-CM | POA: Diagnosis not present

## 2021-04-07 DIAGNOSIS — J449 Chronic obstructive pulmonary disease, unspecified: Secondary | ICD-10-CM | POA: Diagnosis not present

## 2021-04-24 ENCOUNTER — Encounter: Payer: Self-pay | Admitting: Cardiology

## 2021-04-24 ENCOUNTER — Ambulatory Visit (INDEPENDENT_AMBULATORY_CARE_PROVIDER_SITE_OTHER): Payer: Medicare HMO | Admitting: Cardiology

## 2021-04-24 ENCOUNTER — Encounter: Payer: Self-pay | Admitting: *Deleted

## 2021-04-24 VITALS — BP 115/58 | HR 78 | Ht 67.0 in | Wt 167.8 lb

## 2021-04-24 DIAGNOSIS — I4821 Permanent atrial fibrillation: Secondary | ICD-10-CM

## 2021-04-24 DIAGNOSIS — I25119 Atherosclerotic heart disease of native coronary artery with unspecified angina pectoris: Secondary | ICD-10-CM | POA: Diagnosis not present

## 2021-04-24 NOTE — Progress Notes (Signed)
Cardiology Office Note  Date: 04/24/2021   ID: Donald Gaines, DOB 1940-04-15, MRN [id number]  PCP:  Celene Squibb, MD  Cardiologist:  Rozann Lesches, MD Electrophysiologist:  None   Chief Complaint  Patient presents with   Cardiac follow-up    History of Present Illness: Donald Gaines is a 81 y.o. male last seen in June 2022.  He is here for a follow-up visit.  He tells me that he has felt lightheaded, is also lost a significant amount of weight since June, approximately 30 pounds.  He does not report any obvious change in his diet.  No obvious blood in his stools or abdominal pain.  He does have intermittent anginal symptoms requiring nitroglycerin.  We went over his medications which are noted below.  I talked with him about stopping lisinopril for now.  Also requesting interval lab work from Dr. Juel Burrow office.  He was told that his kidney function was worsening.  He does not report any sense of palpitations.  He has not had any frank syncope.  Past Medical History:  Diagnosis Date   Arteriovenous malformation small bowel    Capsule study 3/10   Bilateral carotid artery stenosis    BPH (benign prostatic hypertrophy)    Candida esophagitis ([facility])    Cataract    Chronic anemia    Chronic diarrhea    COPD (chronic obstructive pulmonary disease) ([facility])    Coronary atherosclerosis of native coronary artery    Multivessel, BMS SVG TO RCA 2000, reportedly  "2" additional stents in interim   Dementia ([place])    Depression    Diabetes mellitus, type 2 ([place])    Essential hypertension    GERD (gastroesophageal reflux disease)    GI bleeding    Cecal ulcers, arteriovenous malformations   Glaucoma    Hemorrhoids    Hyperlipidemia    Myocardial infarction ([place])    1994   Portal hypertensive gastropathy ([place]) 11/08/2012   EGD. Dr. Laural Golden   Restrictive lung disease    Sleep apnea    TIA (transient ischemic attack)    2005    Past Surgical History:  Procedure Laterality  Date   CAPSULE ENDO  03/10   cardiac catherization  04/2012   CARDIAC CATHETERIZATION  04/05/1996 [name]   EF 45%, occluded SVG to circumflex, patent SVG to D1 and PDA and patent LIMA to LAD with severe native vessel disease.   CARDIAC CATHETERIZATION  08/22/1998 [name]   Mild LM, severe LAD,severe CX, occlude RCA, patient  SVG to diatal RCA, patent vein graft to D1 and patent LIMA to LAD.    CATARACT EXTRACTION W/PHACO Right 09/22/2012   Procedure: CATARACT EXTRACTION PHACO AND INTRAOCULAR LENS PLACEMENT (IOC);  Surgeon: Tonny Branch, MD;  Location: [facility];  Service: Ophthalmology;  Laterality: Right;  CDE: 11.43   CATARACT EXTRACTION W/PHACO Left 10/17/2012   Procedure: CATARACT EXTRACTION PHACO AND INTRAOCULAR LENS PLACEMENT (IOC);  Surgeon: Tonny Branch, MD;  Location: [facility];  Service: Ophthalmology;  Laterality: Left;  CDE: 8.06   COLONOSCOPY  APR 2008   SIMPLE ADENOMA, [place] TICS, IH   COLONOSCOPY  FEB 2008 ANEMIA. MELENA    2 LARGE ILEOCEAL ULCERS 2o to ASA, [place] TICS, IH   COLONOSCOPY  SEP 2009 TRANSFUSION DEP ANEMIA   AC AVM-ABLATED, [place] TICS, IH   COLONOSCOPY WITH ESOPHAGOGASTRODUODENOSCOPY (EGD) N/A 11/08/2012   Procedure: COLONOSCOPY WITH ESOPHAGOGASTRODUODENOSCOPY (EGD);  Surgeon: Rogene Houston, MD;  Location: [facility];  Service: Endoscopy;  Laterality: N/A;  250-rescheduled to 7:30 Ann to notify pt   CORONARY ANGIOPLASTY WITH STENT PLACEMENT  08/22/1998 [name]   TEC stenting of the SVG to RCA-Last seen by cardiologist in 2010.   CORONARY ARTERY BYPASS GRAFT  1995-triple bypass   LIMA-LAD, LIMA to intermediate branch, SVG to PD and second OM,   ESOPHAGOGASTRODUODENOSCOPY  SEP 09   DUODENAL LIPOMA   ESOPHAGOGASTRODUODENOSCOPY N/A 09/27/2013   Procedure: ESOPHAGOGASTRODUODENOSCOPY (EGD) Push enteroscopy;  Surgeon: Rogene Houston, MD;  Location: [facility];  Service: Endoscopy;  Laterality: N/A;   GIVENS CAPSULE STUDY N/A 11/24/2012   Procedure: GIVENS CAPSULE STUDY;   Surgeon: Rogene Houston, MD;  Location: [facility];  Service: Endoscopy;  Laterality: N/A;  [place]  09/27/2013   Procedure: HOT HEMOSTASIS (ARGON PLASMA COAGULATION/BICAP);  Surgeon: Rogene Houston, MD;  Location: [facility];  Service: Endoscopy;;   HYDROGEN BREATH TEST  2009   RIGHT INGUINAL HERNIA REPAIR     UPPER GASTROINTESTINAL ENDOSCOPY  SEP 2009   GASTRIC AVM ABLATED, NL DUO Bx    Current Outpatient Medications  Medication Sig Dispense Refill   acetaminophen (TYLENOL) 500 MG tablet Take 500 mg by mouth every 6 (six) hours as needed for headache.     albuterol (PROVENTIL) (2.5 MG/3ML) 0.083% nebulizer solution Take 3 mLs (2.5 mg total) by nebulization every 2 (two) hours as needed for wheezing or shortness of breath. 75 mL 12   ALPRAZolam (XANAX) 0.5 MG tablet Take 0.5 mg by mouth 2 (two) times daily as needed for anxiety.     carvedilol (COREG) 12.5 MG tablet Take 12.5 mg by mouth 2 (two) times daily with a meal.      cetirizine (ZYRTEC) 10 MG tablet Take 10 mg by mouth at bedtime.      clopidogrel (PLAVIX) 75 MG tablet Take 1 tablet (75 mg total) by mouth every morning.     escitalopram (LEXAPRO) 10 MG tablet Take 10 mg by mouth daily.     ferrous sulfate 325 (65 FE) MG tablet Take 325 mg by mouth daily with breakfast.     finasteride (PROSCAR) 5 MG tablet Take 5 mg by mouth daily.     furosemide (LASIX) 40 MG tablet Take 1 tablet (40 mg total) by mouth 2 (two) times daily. 30 tablet    metFORMIN (GLUCOPHAGE) 500 MG tablet Take 500 mg by mouth daily with breakfast.     NITROSTAT 0.4 MG SL tablet Place 0.4 mg under the tongue every 5 (five) minutes as needed for chest pain.      omeprazole (PRILOSEC) 20 MG capsule Take 20 mg by mouth daily.      rosuvastatin (CRESTOR) 20 MG tablet Take 1 tablet (20 mg total) by mouth daily. 90 tablet 0   umeclidinium-vilanterol (ANORO ELLIPTA) 62.5-25 MCG/INH AEPB Inhale 1 puff into the lungs daily.     No current  facility-administered medications for this visit.   Allergies:  Penicillins   ROS:  No syncope.  Physical Exam: VS:  BP (!) 115/58    Pulse 78    Ht 5\' 7"  (1.702 m)    Wt 167 lb 12.8 oz (76.1 kg)    SpO2 97%    BMI 26.28 kg/m , BMI Body mass index is 26.28 kg/m.  Wt Readings from Last 3 Encounters:  04/24/21 167 lb 12.8 oz (76.1 kg)  08/22/20 198 lb (89.8 kg)  06/05/20 190 lb 0.6 oz (86.2  kg)    General: Patient in no distress. HEENT: Conjunctiva and lids normal, wearing a mask. Neck: Supple, no elevated JVP or carotid bruits, no thyromegaly. Lungs: Clear to auscultation, nonlabored breathing at rest. Cardiac: Irregularly irregular, no S3, 1/6 systolic murmur. Abdomen: Soft, bowel sounds present. Extremities: No pitting edema.  ECG:  An ECG dated 06/04/2020 showed was personally reviewed today and demonstrated:  Atrial fibrillation with PVCs, nonspecific ST-T changes.  Recent Labwork: 06/03/2020: B Natriuretic Peptide 439.0; Hemoglobin 11.6; Platelets 179 06/04/2020: ALT 10; AST 15 06/05/2020: BUN 23; Creatinine, Ser 1.85; Magnesium 2.3; Potassium 4.2; Sodium 141   Other Studies Reviewed Today:  Echocardiogram 06/03/2020:  1. Poor acoustic window and ectopy make evaluation of LVEF difficult,  even with the use of Defiinity. Left ventricular ejection fraction, by  estimation, is 60 to 65%. There is moderate left ventricular hypertrophy.  Left ventricular diastolic parameters  are indeterminate.   2. Right ventricular systolic function is normal. The right ventricular  size is normal.   3. Left atrial size was severely dilated.   4. Right atrial size was mildly dilated.   5. The mitral valve is normal in structure. Trivial mitral valve  regurgitation.   6. Aortic valve regurgitation is not visualized. Mild to moderate aortic  valve sclerosis/calcification is present, without any evidence of aortic  stenosis.   7. The inferior vena cava is normal in size with greater than 50%   respiratory variability, suggesting right atrial pressure of 3 mmHg.   Assessment and Plan:  1.  Permanent atrial fibrillation with CHA2DS2-VASc score of 6.  He is not anticoagulated with history of recurrent GI bleeding and AVMs.  Heart rate is adequately controlled on current dose of Coreg.  2.  Unexplained weight loss of approximately 30 pounds in 6 months.  No obvious blood in the stools or abdominal pain.  He does have a longstanding tobacco abuse history, no recent chest imaging.  I recommended that he follow-up with his PCP for further evaluation.  3.  Lightheadedness, blood pressure low normal range.  Would stop lisinopril at this time.  Requesting interval lab work from Dr. [place] Crane.  4.  CAD status post CABG and PCI.  He reports intermittent angina symptoms responsive to nitroglycerin.  We will continue medical therapy which also includes Crestor and Coreg.  Medication Adjustments/Labs and Tests Ordered: Current medicines are reviewed at length with the patient today.  Concerns regarding medicines are outlined above.   Tests Ordered: No orders of the defined types were placed in this encounter.   Medication Changes: No orders of the defined types were placed in this encounter.   Disposition:  Follow up  6 months.  Signed, Satira Sark, MD, [facility] 04/24/2021 4:37 PM    [place] [facility] [place], [place], [place] [address] Phone: [phone]; Fax: [phone]

## 2021-04-24 NOTE — Patient Instructions (Addendum)
Medication Instructions:  Your physician has recommended you make the following change in your medication:  Stop lisinopril Continue other medications the same  Labwork: none  Testing/Procedures: none  Follow-Up: Your physician recommends that you schedule a follow-up appointment in: 6 months  Any Other Special Instructions Will Be Listed Below (If Applicable). Please contact Dr. Juel Burrow office about your weight loss  If you need a refill on your cardiac medications before your next appointment, please call your pharmacy.

## 2021-05-01 ENCOUNTER — Telehealth: Payer: Self-pay | Admitting: Licensed Clinical Social Worker

## 2021-05-01 NOTE — Telephone Encounter (Signed)
CSW  received request from [place] office to assist patient who had expressed concerns with transportation. CSW contacted patient although he was not available and spoke with his sister. CSW provided the number for [employer] as patient has medicaid which will provide transportation. CSW also provided number for [facility] as well. Sister verbalizes understanding and denies any other concerns at this time. CSW available as needed. Raquel Sarna, [place], [place]

## 2021-05-08 DIAGNOSIS — J449 Chronic obstructive pulmonary disease, unspecified: Secondary | ICD-10-CM | POA: Diagnosis not present

## 2021-05-20 DIAGNOSIS — N189 Chronic kidney disease, unspecified: Secondary | ICD-10-CM | POA: Diagnosis not present

## 2021-05-20 DIAGNOSIS — E782 Mixed hyperlipidemia: Secondary | ICD-10-CM | POA: Diagnosis not present

## 2021-06-05 DIAGNOSIS — J449 Chronic obstructive pulmonary disease, unspecified: Secondary | ICD-10-CM | POA: Diagnosis not present

## 2021-07-06 DIAGNOSIS — J449 Chronic obstructive pulmonary disease, unspecified: Secondary | ICD-10-CM | POA: Diagnosis not present

## 2021-08-05 DIAGNOSIS — J449 Chronic obstructive pulmonary disease, unspecified: Secondary | ICD-10-CM | POA: Diagnosis not present

## 2021-09-05 DIAGNOSIS — J449 Chronic obstructive pulmonary disease, unspecified: Secondary | ICD-10-CM | POA: Diagnosis not present

## 2021-10-05 DIAGNOSIS — J449 Chronic obstructive pulmonary disease, unspecified: Secondary | ICD-10-CM | POA: Diagnosis not present

## 2021-10-20 DIAGNOSIS — E1165 Type 2 diabetes mellitus with hyperglycemia: Secondary | ICD-10-CM | POA: Diagnosis not present

## 2021-10-20 DIAGNOSIS — I129 Hypertensive chronic kidney disease with stage 1 through stage 4 chronic kidney disease, or unspecified chronic kidney disease: Secondary | ICD-10-CM | POA: Diagnosis not present

## 2021-10-20 DIAGNOSIS — J449 Chronic obstructive pulmonary disease, unspecified: Secondary | ICD-10-CM | POA: Diagnosis not present

## 2021-10-20 DIAGNOSIS — N1832 Chronic kidney disease, stage 3b: Secondary | ICD-10-CM | POA: Diagnosis not present

## 2021-10-28 NOTE — Progress Notes (Deleted)
Cardiology Office Note  Date: 10/28/2021   ID: Donald Gaines, DOB October 31, 1940, MRN [id number]  PCP:  Celene Squibb, MD  Cardiologist:  Rozann Lesches, MD Electrophysiologist:  None   No chief complaint on file.   History of Present Illness: Donald Gaines is a 81 y.o. male last seen in February.  Past Medical History:  Diagnosis Date   Arteriovenous malformation small bowel    Capsule study 3/10   Bilateral carotid artery stenosis    BPH (benign prostatic hypertrophy)    Candida esophagitis (HCC)    Cataract    Chronic anemia    Chronic diarrhea    COPD (chronic obstructive pulmonary disease) (HCC)    Coronary atherosclerosis of native coronary artery    Multivessel, BMS SVG TO RCA 2000, reportedly  "2" additional stents in interim   Dementia ([place])    Depression    Diabetes mellitus, type 2 ([place])    Essential hypertension    GERD (gastroesophageal reflux disease)    GI bleeding    Cecal ulcers, arteriovenous malformations   Glaucoma    Hemorrhoids    Hyperlipidemia    Myocardial infarction ([place])    1994   Portal hypertensive gastropathy ([place]) 11/08/2012   EGD. Dr. Laural Golden   Restrictive lung disease    Sleep apnea    TIA (transient ischemic attack)    2005    Past Surgical History:  Procedure Laterality Date   CAPSULE ENDO  03/10   cardiac catherization  04/2012   CARDIAC CATHETERIZATION  04/05/1996 Donald Gaines   EF 45%, occluded SVG to circumflex, patent SVG to D1 and PDA and patent LIMA to LAD with severe native vessel disease.   CARDIAC CATHETERIZATION  08/22/1998 Donald Gaines   Mild LM, severe LAD,severe CX, occlude RCA, patient  SVG to diatal RCA, patent vein graft to D1 and patent LIMA to LAD.    CATARACT EXTRACTION W/PHACO Right 09/22/2012   Procedure: CATARACT EXTRACTION PHACO AND INTRAOCULAR LENS PLACEMENT (IOC);  Surgeon: Tonny Branch, MD;  Location: [facility];  Service: Ophthalmology;  Laterality: Right;  CDE: 11.43   CATARACT EXTRACTION W/PHACO  Left 10/17/2012   Procedure: CATARACT EXTRACTION PHACO AND INTRAOCULAR LENS PLACEMENT (IOC);  Surgeon: Tonny Branch, MD;  Location: [facility];  Service: Ophthalmology;  Laterality: Left;  CDE: 8.06   COLONOSCOPY  APR 2008   SIMPLE ADENOMA, [place] TICS, IH   COLONOSCOPY  FEB 2008 ANEMIA. MELE[place]    2 LARGE ILEOCEAL ULCERS 2o to ASA, [place] TICS, IH   COLONOSCOPY  SEP 2009 TRANSFUSION DEP ANEMIA   AC AVM-ABLATED, [place] TICS, IH   COLONOSCOPY WITH ESOPHAGOGASTRODUODENOSCOPY (EGD) N/A 11/08/2012   Procedure: COLONOSCOPY WITH ESOPHAGOGASTRODUODENOSCOPY (EGD);  Surgeon: Rogene Houston, MD;  Location: [facility];  Service: Endoscopy;  Laterality: N/A;  250-rescheduled to 7:30 Ann to notify pt   CORONARY ANGIOPLASTY WITH STENT PLACEMENT  08/22/1998 Donald Gaines   TEC stenting of the SVG to RCA-Last seen by cardiologist in 2010.   CORONARY ARTERY BYPASS GRAFT  1995-triple bypass   LIMA-LAD, LIMA to intermediate branch, SVG to PD and second OM,   ESOPHAGOGASTRODUODENOSCOPY  SEP 09   DUODENAL LIPOMA   ESOPHAGOGASTRODUODENOSCOPY N/A 09/27/2013   Procedure: ESOPHAGOGASTRODUODENOSCOPY (EGD) Push enteroscopy;  Surgeon: Rogene Houston, MD;  Location: [facility];  Service: Endoscopy;  Laterality: N/A;   GIVENS CAPSULE STUDY N/A 11/24/2012   Procedure: GIVENS CAPSULE STUDY;  Surgeon: Rogene Houston, MD;  Location: [facility];  Service: Endoscopy;  Laterality: N/A;  [place]  09/27/2013   Procedure: HOT HEMOSTASIS (ARGON PLASMA COAGULATION/BICAP);  Surgeon: Rogene Houston, MD;  Location: [facility];  Service: Endoscopy;;   HYDROGEN BREATH TEST  2009   RIGHT INGUINAL HERNIA REPAIR     UPPER GASTROINTESTINAL ENDOSCOPY  SEP 2009   GASTRIC AVM ABLATED, NL DUO Bx    Current Outpatient Medications  Medication Sig Dispense Refill   acetaminophen (TYLENOL) 500 MG tablet Take 500 mg by mouth every 6 (six) hours as needed for headache.     albuterol (PROVENTIL) (2.5 MG/3ML) 0.083% nebulizer solution Take 3 mLs  (2.5 mg total) by nebulization every 2 (two) hours as needed for wheezing or shortness of breath. 75 mL 12   ALPRAZolam (XANAX) 0.5 MG tablet Take 0.5 mg by mouth 2 (two) times daily as needed for anxiety.     carvedilol (COREG) 12.5 MG tablet Take 12.5 mg by mouth 2 (two) times daily with a meal.      cetirizine (ZYRTEC) 10 MG tablet Take 10 mg by mouth at bedtime.      clopidogrel (PLAVIX) 75 MG tablet Take 1 tablet (75 mg total) by mouth every morning.     escitalopram (LEXAPRO) 10 MG tablet Take 10 mg by mouth daily.     ferrous sulfate 325 (65 FE) MG tablet Take 325 mg by mouth daily with breakfast.     finasteride (PROSCAR) 5 MG tablet Take 5 mg by mouth daily.     furosemide (LASIX) 40 MG tablet Take 1 tablet (40 mg total) by mouth 2 (two) times daily. 30 tablet    metFORMIN (GLUCOPHAGE) 500 MG tablet Take 500 mg by mouth daily with breakfast.     NITROSTAT 0.4 MG SL tablet Place 0.4 mg under the tongue every 5 (five) minutes as needed for chest pain.      omeprazole (PRILOSEC) 20 MG capsule Take 20 mg by mouth daily.      rosuvastatin (CRESTOR) 20 MG tablet Take 1 tablet (20 mg total) by mouth daily. 90 tablet 0   umeclidinium-vilanterol (ANORO ELLIPTA) 62.5-25 MCG/INH AEPB Inhale 1 puff into the lungs daily.     No current facility-administered medications for this visit.   Allergies:  Penicillins   Social History: The patient  reports that he has been smoking cigarettes. He started smoking about 72 years ago. He has a 60.00 pack-year smoking history. He has never used smokeless tobacco. He reports current drug use. Drug: Marijuana. He reports that he does not drink alcohol.   Family History: The patient's family history includes Heart attack in his mother; Heart disease in his mother; Hypertension in his mother.   ROS:  Please see the history of present illness. Otherwise, complete review of systems is positive for {NONE DEFAULTED:18576}.  All other systems are reviewed and negative.    Physical Exam: VS:  There were no vitals taken for this visit., BMI There is no height or weight on file to calculate BMI.  Wt Readings from Last 3 Encounters:  04/24/21 167 lb 12.8 oz (76.1 kg)  08/22/20 198 lb (89.8 kg)  06/05/20 190 lb 0.6 oz (86.2 kg)    General: Patient appears comfortable at rest. HEENT: Conjunctiva and lids normal, oropharynx clear with moist mucosa. Neck: Supple, no elevated JVP or carotid bruits, no thyromegaly. Lungs: Clear to auscultation, nonlabored breathing at rest. Cardiac: Regular rate and rhythm, no S3 or significant systolic murmur, no pericardial rub. Abdomen:  Soft, nontender, no hepatomegaly, bowel sounds present, no guarding or rebound. Extremities: No pitting edema, distal pulses 2+. Skin: Warm and dry. Musculoskeletal: No kyphosis. Neuropsychiatric: Alert and oriented x3, affect grossly appropriate.  ECG:  An ECG dated 06/04/2020 was personally reviewed today and demonstrated:  Atrial fibrillation with PVCs, nonspecific ST-T changes.  Recent Labwork:  September 2022: Hemoglobin A1c 6.3%, cholesterol 120, triglycerides 82, HDL 50, LDL 54, BUN 34, creatinine 2.12, potassium 5.0, AST 13, ALT 6, hemoglobin 10.6, platelets 176  Other Studies Reviewed Today:  Echocardiogram 06/03/2020:  1. Poor acoustic window and ectopy make evaluation of LVEF difficult,  even with the use of Defiinity. Left ventricular ejection fraction, by  estimation, is 60 to 65%. There is moderate left ventricular hypertrophy.  Left ventricular diastolic parameters  are indeterminate.   2. Right ventricular systolic function is normal. The right ventricular  size is normal.   3. Left atrial size was severely dilated.   4. Right atrial size was mildly dilated.   5. The mitral valve is normal in structure. Trivial mitral valve  regurgitation.   6. Aortic valve regurgitation is not visualized. Mild to moderate aortic  valve sclerosis/calcification is present, without any  evidence of aortic  stenosis.   7. The inferior vena cava is normal in size with greater than 50%  respiratory variability, suggesting right atrial pressure of 3 mmHg.   Assessment and Plan:    Medication Adjustments/Labs and Tests Ordered: Current medicines are reviewed at length with the patient today.  Concerns regarding medicines are outlined above.   Tests Ordered: No orders of the defined types were placed in this encounter.   Medication Changes: No orders of the defined types were placed in this encounter.   Disposition:  Follow up {follow up:15908}  Signed, Satira Sark, MD, [facility] 10/28/2021 5:16 PM    [facility] [place], [place], [place] [address] Phone: [phone]; Fax: [phone]

## 2021-10-29 ENCOUNTER — Ambulatory Visit: Payer: Medicare HMO | Admitting: Cardiology

## 2021-10-29 DIAGNOSIS — I25119 Atherosclerotic heart disease of native coronary artery with unspecified angina pectoris: Secondary | ICD-10-CM

## 2021-11-05 ENCOUNTER — Emergency Department (HOSPITAL_COMMUNITY): Payer: Medicare HMO

## 2021-11-05 ENCOUNTER — Inpatient Hospital Stay (HOSPITAL_COMMUNITY)
Admission: EM | Admit: 2021-11-05 | Discharge: 2021-11-08 | DRG: 378 | Disposition: A | Discharge status: Home / Self Care | Payer: Medicare HMO | Attending: Internal Medicine | Admitting: Internal Medicine

## 2021-11-05 DIAGNOSIS — J441 Chronic obstructive pulmonary disease with (acute) exacerbation: Secondary | ICD-10-CM | POA: Diagnosis not present

## 2021-11-05 DIAGNOSIS — F03911 Unspecified dementia, unspecified severity, with agitation: Secondary | ICD-10-CM | POA: Diagnosis not present

## 2021-11-05 DIAGNOSIS — Z7984 Long term (current) use of oral hypoglycemic drugs: Secondary | ICD-10-CM

## 2021-11-05 DIAGNOSIS — Z880 Allergy status to penicillin: Secondary | ICD-10-CM

## 2021-11-05 DIAGNOSIS — K766 Portal hypertension: Secondary | ICD-10-CM | POA: Diagnosis present

## 2021-11-05 DIAGNOSIS — Z743 Need for continuous supervision: Secondary | ICD-10-CM | POA: Diagnosis not present

## 2021-11-05 DIAGNOSIS — Z9981 Dependence on supplemental oxygen: Secondary | ICD-10-CM

## 2021-11-05 DIAGNOSIS — I509 Heart failure, unspecified: Secondary | ICD-10-CM | POA: Diagnosis not present

## 2021-11-05 DIAGNOSIS — I2510 Atherosclerotic heart disease of native coronary artery without angina pectoris: Secondary | ICD-10-CM | POA: Diagnosis not present

## 2021-11-05 DIAGNOSIS — F17200 Nicotine dependence, unspecified, uncomplicated: Secondary | ICD-10-CM | POA: Diagnosis present

## 2021-11-05 DIAGNOSIS — I5032 Chronic diastolic (congestive) heart failure: Secondary | ICD-10-CM | POA: Diagnosis not present

## 2021-11-05 DIAGNOSIS — J9611 Chronic respiratory failure with hypoxia: Secondary | ICD-10-CM | POA: Diagnosis present

## 2021-11-05 DIAGNOSIS — I959 Hypotension, unspecified: Secondary | ICD-10-CM | POA: Diagnosis present

## 2021-11-05 DIAGNOSIS — D62 Acute posthemorrhagic anemia: Secondary | ICD-10-CM | POA: Diagnosis present

## 2021-11-05 DIAGNOSIS — D132 Benign neoplasm of duodenum: Secondary | ICD-10-CM | POA: Diagnosis not present

## 2021-11-05 DIAGNOSIS — Z20822 Contact with and (suspected) exposure to covid-19: Secondary | ICD-10-CM | POA: Diagnosis not present

## 2021-11-05 DIAGNOSIS — I5030 Unspecified diastolic (congestive) heart failure: Secondary | ICD-10-CM | POA: Diagnosis not present

## 2021-11-05 DIAGNOSIS — Z8249 Family history of ischemic heart disease and other diseases of the circulatory system: Secondary | ICD-10-CM

## 2021-11-05 DIAGNOSIS — N400 Enlarged prostate without lower urinary tract symptoms: Secondary | ICD-10-CM | POA: Diagnosis not present

## 2021-11-05 DIAGNOSIS — N1832 Chronic kidney disease, stage 3b: Secondary | ICD-10-CM | POA: Diagnosis present

## 2021-11-05 DIAGNOSIS — G4730 Sleep apnea, unspecified: Secondary | ICD-10-CM | POA: Diagnosis present

## 2021-11-05 DIAGNOSIS — I4821 Permanent atrial fibrillation: Secondary | ICD-10-CM | POA: Diagnosis not present

## 2021-11-05 DIAGNOSIS — Z79899 Other long term (current) drug therapy: Secondary | ICD-10-CM

## 2021-11-05 DIAGNOSIS — K317 Polyp of stomach and duodenum: Secondary | ICD-10-CM | POA: Diagnosis not present

## 2021-11-05 DIAGNOSIS — I4891 Unspecified atrial fibrillation: Secondary | ICD-10-CM | POA: Diagnosis not present

## 2021-11-05 DIAGNOSIS — Z66 Do not resuscitate: Secondary | ICD-10-CM | POA: Diagnosis present

## 2021-11-05 DIAGNOSIS — F0390 Unspecified dementia without behavioral disturbance: Secondary | ICD-10-CM | POA: Diagnosis not present

## 2021-11-05 DIAGNOSIS — Z955 Presence of coronary angioplasty implant and graft: Secondary | ICD-10-CM

## 2021-11-05 DIAGNOSIS — Z961 Presence of intraocular lens: Secondary | ICD-10-CM | POA: Diagnosis present

## 2021-11-05 DIAGNOSIS — I130 Hypertensive heart and chronic kidney disease with heart failure and stage 1 through stage 4 chronic kidney disease, or unspecified chronic kidney disease: Secondary | ICD-10-CM | POA: Diagnosis not present

## 2021-11-05 DIAGNOSIS — K921 Melena: Secondary | ICD-10-CM | POA: Diagnosis not present

## 2021-11-05 DIAGNOSIS — E119 Type 2 diabetes mellitus without complications: Secondary | ICD-10-CM

## 2021-11-05 DIAGNOSIS — E876 Hypokalemia: Secondary | ICD-10-CM | POA: Diagnosis not present

## 2021-11-05 DIAGNOSIS — R0602 Shortness of breath: Secondary | ICD-10-CM | POA: Diagnosis not present

## 2021-11-05 DIAGNOSIS — J9620 Acute and chronic respiratory failure, unspecified whether with hypoxia or hypercapnia: Secondary | ICD-10-CM

## 2021-11-05 DIAGNOSIS — R0609 Other forms of dyspnea: Principal | ICD-10-CM

## 2021-11-05 DIAGNOSIS — H409 Unspecified glaucoma: Secondary | ICD-10-CM | POA: Diagnosis present

## 2021-11-05 DIAGNOSIS — Z7902 Long term (current) use of antithrombotics/antiplatelets: Secondary | ICD-10-CM

## 2021-11-05 DIAGNOSIS — Z716 Tobacco abuse counseling: Secondary | ICD-10-CM

## 2021-11-05 DIAGNOSIS — N183 Chronic kidney disease, stage 3 unspecified: Secondary | ICD-10-CM | POA: Diagnosis present

## 2021-11-05 DIAGNOSIS — Z951 Presence of aortocoronary bypass graft: Secondary | ICD-10-CM

## 2021-11-05 DIAGNOSIS — N281 Cyst of kidney, acquired: Secondary | ICD-10-CM | POA: Diagnosis not present

## 2021-11-05 DIAGNOSIS — T380X5A Adverse effect of glucocorticoids and synthetic analogues, initial encounter: Secondary | ICD-10-CM | POA: Diagnosis not present

## 2021-11-05 DIAGNOSIS — N179 Acute kidney failure, unspecified: Secondary | ICD-10-CM | POA: Diagnosis not present

## 2021-11-05 DIAGNOSIS — I252 Old myocardial infarction: Secondary | ICD-10-CM

## 2021-11-05 DIAGNOSIS — D649 Anemia, unspecified: Secondary | ICD-10-CM

## 2021-11-05 DIAGNOSIS — R062 Wheezing: Secondary | ICD-10-CM | POA: Diagnosis not present

## 2021-11-05 DIAGNOSIS — E1122 Type 2 diabetes mellitus with diabetic chronic kidney disease: Secondary | ICD-10-CM | POA: Diagnosis not present

## 2021-11-05 DIAGNOSIS — D696 Thrombocytopenia, unspecified: Secondary | ICD-10-CM | POA: Diagnosis present

## 2021-11-05 DIAGNOSIS — I110 Hypertensive heart disease with heart failure: Secondary | ICD-10-CM | POA: Diagnosis not present

## 2021-11-05 DIAGNOSIS — K31819 Angiodysplasia of stomach and duodenum without bleeding: Secondary | ICD-10-CM | POA: Diagnosis not present

## 2021-11-05 DIAGNOSIS — F17210 Nicotine dependence, cigarettes, uncomplicated: Secondary | ICD-10-CM | POA: Diagnosis present

## 2021-11-05 DIAGNOSIS — D500 Iron deficiency anemia secondary to blood loss (chronic): Secondary | ICD-10-CM | POA: Diagnosis not present

## 2021-11-05 DIAGNOSIS — E785 Hyperlipidemia, unspecified: Secondary | ICD-10-CM | POA: Diagnosis not present

## 2021-11-05 DIAGNOSIS — D509 Iron deficiency anemia, unspecified: Secondary | ICD-10-CM | POA: Diagnosis not present

## 2021-11-05 DIAGNOSIS — K5520 Angiodysplasia of colon without hemorrhage: Secondary | ICD-10-CM | POA: Diagnosis not present

## 2021-11-05 DIAGNOSIS — K31811 Angiodysplasia of stomach and duodenum with bleeding: Secondary | ICD-10-CM | POA: Diagnosis not present

## 2021-11-05 DIAGNOSIS — Z634 Disappearance and death of family member: Secondary | ICD-10-CM

## 2021-11-05 DIAGNOSIS — K219 Gastro-esophageal reflux disease without esophagitis: Secondary | ICD-10-CM | POA: Diagnosis present

## 2021-11-05 DIAGNOSIS — R079 Chest pain, unspecified: Secondary | ICD-10-CM | POA: Diagnosis not present

## 2021-11-05 DIAGNOSIS — R69 Illness, unspecified: Secondary | ICD-10-CM | POA: Diagnosis not present

## 2021-11-05 DIAGNOSIS — K922 Gastrointestinal hemorrhage, unspecified: Secondary | ICD-10-CM | POA: Diagnosis not present

## 2021-11-05 DIAGNOSIS — J449 Chronic obstructive pulmonary disease, unspecified: Secondary | ICD-10-CM | POA: Diagnosis present

## 2021-11-05 DIAGNOSIS — R7989 Other specified abnormal findings of blood chemistry: Secondary | ICD-10-CM | POA: Diagnosis not present

## 2021-11-05 DIAGNOSIS — R0902 Hypoxemia: Secondary | ICD-10-CM | POA: Diagnosis not present

## 2021-11-05 DIAGNOSIS — Z86010 Personal history of colonic polyps: Secondary | ICD-10-CM

## 2021-11-05 DIAGNOSIS — R0989 Other specified symptoms and signs involving the circulatory and respiratory systems: Secondary | ICD-10-CM | POA: Diagnosis not present

## 2021-11-05 DIAGNOSIS — I10 Essential (primary) hypertension: Secondary | ICD-10-CM

## 2021-11-05 DIAGNOSIS — Q279 Congenital malformation of peripheral vascular system, unspecified: Secondary | ICD-10-CM

## 2021-11-05 DIAGNOSIS — R0689 Other abnormalities of breathing: Secondary | ICD-10-CM | POA: Diagnosis not present

## 2021-11-05 DIAGNOSIS — Z8673 Personal history of transient ischemic attack (TIA), and cerebral infarction without residual deficits: Secondary | ICD-10-CM

## 2021-11-05 LAB — TROPONIN I (HIGH SENSITIVITY)
Troponin I (High Sensitivity): 18 ng/L — ABNORMAL HIGH (ref <18)
Troponin I (High Sensitivity): 18 ng/L — ABNORMAL HIGH (ref <18)

## 2021-11-05 LAB — COMPREHENSIVE METABOLIC PANEL
ALT: 9 U/L (ref 0–44)
AST: 16 U/L (ref 15–41)
Albumin: 3.1 g/dL — ABNORMAL LOW (ref 3.5–5.0)
Alkaline Phosphatase: 46 U/L (ref 38–126)
Anion gap: 8 (ref 5–15)
BUN: 31 mg/dL — ABNORMAL HIGH (ref 8–23)
CO2: 31 mmol/L (ref 22–32)
Calcium: 8.7 mg/dL — ABNORMAL LOW (ref 8.9–10.3)
Chloride: 101 mmol/L (ref 98–111)
Creatinine, Ser: 2.7 mg/dL — ABNORMAL HIGH (ref 0.61–1.24)
GFR, Estimated: 23 mL/min — ABNORMAL LOW (ref >60)
Glucose, Bld: 118 mg/dL — ABNORMAL HIGH (ref 70–99)
Potassium: 3.2 mmol/L — ABNORMAL LOW (ref 3.5–5.1)
Sodium: 140 mmol/L (ref 135–145)
Total Bilirubin: 0.7 mg/dL (ref 0.3–1.2)
Total Protein: 6.2 g/dL — ABNORMAL LOW (ref 6.5–8.1)

## 2021-11-05 LAB — BRAIN NATRIURETIC PEPTIDE: B Natriuretic Peptide: 736 pg/mL — ABNORMAL HIGH (ref 0.0–100.0)

## 2021-11-05 LAB — IRON AND TIBC
Iron: 65 ug/dL (ref 45–182)
Saturation Ratios: 15 % — ABNORMAL LOW (ref 17.9–39.5)
TIBC: 431 ug/dL (ref 250–450)
UIBC: 366 ug/dL

## 2021-11-05 LAB — CBC WITH DIFFERENTIAL/PLATELET
Abs Immature Granulocytes: 0.01 10*3/uL (ref 0.00–0.07)
Basophils Absolute: 0 10*3/uL (ref 0.0–0.1)
Basophils Relative: 1 %
Eosinophils Absolute: 0.1 10*3/uL (ref 0.0–0.5)
Eosinophils Relative: 1 %
HCT: 27 % — ABNORMAL LOW (ref 39.0–52.0)
Hemoglobin: 8 g/dL — ABNORMAL LOW (ref 13.0–17.0)
Immature Granulocytes: 0 %
Lymphocytes Relative: 11 %
Lymphs Abs: 0.6 10*3/uL — ABNORMAL LOW (ref 0.7–4.0)
MCH: 32.3 pg (ref 26.0–34.0)
MCHC: 29.6 g/dL — ABNORMAL LOW (ref 30.0–36.0)
MCV: 108.9 fL — ABNORMAL HIGH (ref 80.0–100.0)
Monocytes Absolute: 0.5 10*3/uL (ref 0.1–1.0)
Monocytes Relative: 9 %
Neutro Abs: 4.4 10*3/uL (ref 1.7–7.7)
Neutrophils Relative %: 78 %
Platelets: 160 10*3/uL (ref 150–400)
RBC: 2.48 MIL/uL — ABNORMAL LOW (ref 4.22–5.81)
RDW: 15.4 % (ref 11.5–15.5)
WBC: 5.6 10*3/uL (ref 4.0–10.5)
nRBC: 0 % (ref 0.0–0.2)

## 2021-11-05 LAB — RETICULOCYTES
Immature Retic Fract: 31.5 % — ABNORMAL HIGH (ref 2.3–15.9)
RBC.: 2.44 MIL/uL — ABNORMAL LOW (ref 4.22–5.81)
Retic Count, Absolute: 68.1 10*3/uL (ref 19.0–186.0)
Retic Ct Pct: 2.8 % (ref 0.4–3.1)

## 2021-11-05 LAB — GLUCOSE, CAPILLARY: Glucose-Capillary: 154 mg/dL — ABNORMAL HIGH (ref 70–99)

## 2021-11-05 LAB — FERRITIN: Ferritin: 12 ng/mL — ABNORMAL LOW (ref 24–336)

## 2021-11-05 LAB — FOLATE: Folate: 10.3 ng/mL (ref >5.9)

## 2021-11-05 LAB — VITAMIN B12: Vitamin B-12: 333 pg/mL (ref 180–914)

## 2021-11-05 LAB — POC OCCULT BLOOD, ED: Fecal Occult Bld: POSITIVE — AB

## 2021-11-05 LAB — RESP PANEL BY RT-PCR (FLU A&B, COVID) ARPGX2
Influenza A by PCR: NEGATIVE
Influenza B by PCR: NEGATIVE
SARS Coronavirus 2 by RT PCR: NEGATIVE

## 2021-11-05 LAB — PREPARE RBC (CROSSMATCH)

## 2021-11-05 MED ORDER — SODIUM CHLORIDE 0.9 % IV BOLUS
500.0000 mL | Freq: Once | INTRAVENOUS | Status: AC
  Administered 2021-11-05: 500 mL via INTRAVENOUS

## 2021-11-05 MED ORDER — PANTOPRAZOLE SODIUM 40 MG IV SOLR
40.0000 mg | Freq: Two times a day (BID) | INTRAVENOUS | Status: DC
  Filled 2021-11-05: qty 10

## 2021-11-05 MED ORDER — ACETAMINOPHEN 325 MG PO TABS
650.0000 mg | ORAL_TABLET | Freq: Four times a day (QID) | ORAL | Status: DC | PRN
  Administered 2021-11-07: 650 mg via ORAL
  Filled 2021-11-05: qty 2

## 2021-11-05 MED ORDER — SODIUM CHLORIDE 0.9% IV SOLUTION: Freq: Once | INTRAVENOUS | Status: AC

## 2021-11-05 MED ORDER — METHYLPREDNISOLONE SODIUM SUCC 125 MG IJ SOLR
80.0000 mg | Freq: Two times a day (BID) | INTRAMUSCULAR | Status: DC
  Administered 2021-11-05 – 2021-11-06 (×3): 80 mg via INTRAVENOUS
  Filled 2021-11-05 (×3): qty 2

## 2021-11-05 MED ORDER — POTASSIUM CHLORIDE 20 MEQ PO PACK
60.0000 meq | PACK | Freq: Once | ORAL | Status: AC
  Administered 2021-11-05: 60 meq via ORAL
  Filled 2021-11-05: qty 3

## 2021-11-05 MED ORDER — ALBUTEROL SULFATE (2.5 MG/3ML) 0.083% IN NEBU
2.5000 mg | INHALATION_SOLUTION | RESPIRATORY_TRACT | Status: DC | PRN
  Administered 2021-11-06 – 2021-11-08 (×2): 2.5 mg via RESPIRATORY_TRACT
  Filled 2021-11-05 (×2): qty 3

## 2021-11-05 MED ORDER — INSULIN ASPART 100 UNIT/ML IJ SOLN
0.0000 [IU] | Freq: Three times a day (TID) | INTRAMUSCULAR | Status: DC
  Administered 2021-11-06: 1 [IU] via SUBCUTANEOUS
  Administered 2021-11-06: 2 [IU] via SUBCUTANEOUS
  Administered 2021-11-06 – 2021-11-07 (×3): 1 [IU] via SUBCUTANEOUS
  Administered 2021-11-08: 2 [IU] via SUBCUTANEOUS

## 2021-11-05 MED ORDER — PANTOPRAZOLE INFUSION (NEW) - SIMPLE MED
8.0000 mg/h | INTRAVENOUS | Status: DC
  Administered 2021-11-05 – 2021-11-08 (×5): 8 mg/h via INTRAVENOUS
  Filled 2021-11-05 (×3): qty 100
  Filled 2021-11-05 (×2): qty 80
  Filled 2021-11-05 (×2): qty 100
  Filled 2021-11-05 (×2): qty 80

## 2021-11-05 MED ORDER — FUROSEMIDE 10 MG/ML IJ SOLN
20.0000 mg | Freq: Once | INTRAMUSCULAR | Status: AC
Start: 2021-11-05 — End: 2021-11-05
  Administered 2021-11-05: 20 mg via INTRAVENOUS

## 2021-11-05 MED ORDER — ACETAMINOPHEN 650 MG RE SUPP: 650.0000 mg | Freq: Four times a day (QID) | RECTAL | Status: DC | PRN

## 2021-11-05 MED ORDER — INSULIN ASPART 100 UNIT/ML IJ SOLN: 0.0000 [IU] | Freq: Every day | INTRAMUSCULAR | Status: DC

## 2021-11-05 MED ORDER — FUROSEMIDE 10 MG/ML IJ SOLN
20.0000 mg | Freq: Once | INTRAMUSCULAR | Status: DC
  Filled 2021-11-05: qty 2

## 2021-11-05 MED ORDER — PANTOPRAZOLE 80MG IVPB - SIMPLE MED
80.0000 mg | Freq: Once | INTRAVENOUS | Status: AC
  Administered 2021-11-05: 80 mg via INTRAVENOUS

## 2021-11-05 NOTE — ED Notes (Signed)
Blood consent signed

## 2021-11-05 NOTE — ED Provider Notes (Addendum)
[facility] EMERGENCY DEPARTMENT Provider Note   CSN: [id number] Arrival date & time: 11/05/21  1355     History Chief Complaint  Patient presents with   Chest Pain   Shortness of Breath    Donald Gaines is a 81 y.o. male patient with history of type 2 diabetes, dementia, CAD, COPD who presents to the emerged department today with increasing exertional dyspnea over the last 2 to 3 weeks.  Patient states he can typically walk to his mailbox and back and is mildly short of breath.  He is unable now to get halfway to the mailbox without getting short of breath.  He typically wears 2 L of oxygen at home secondary to COPD.  He endorses associated left-sided chest pain which he describes as a stabbing sensation and this has been intermittent since onset 2 to 3 weeks ago.  Nothing seems to make this better or worse.  He denies any nausea, vomiting, diarrhea, abdominal pain.  He does endorse PND and orthopnea.  He has had to sleep with an increased number of pillows.  No fever or chills.   Chest Pain Associated symptoms: shortness of breath   Shortness of Breath Associated symptoms: chest pain        Home Medications Prior to Admission medications   Medication Sig Start Date End Date Taking? Authorizing Provider  acetaminophen (TYLENOL) 500 MG tablet Take 500 mg by mouth every 6 (six) hours as needed for headache.   Yes [provider]  albuterol (PROVENTIL) (2.5 MG/3ML) 0.083% nebulizer solution Take 3 mLs (2.5 mg total) by nebulization every 2 (two) hours as needed for wheezing or shortness of breath. 06/05/20  Yes Johnson, Clanford L, MD  ALPRAZolam (XANAX) 0.5 MG tablet Take 0.5 mg by mouth 2 (two) times daily as needed for anxiety.   Yes [provider]  carvedilol (COREG) 12.5 MG tablet Take 12.5 mg by mouth 2 (two) times daily with a meal.  03/26/14  Yes [provider]  cetirizine (ZYRTEC) 10 MG tablet Take 10 mg by mouth at bedtime.    Yes [provider]  Cholecalciferol (VITAMIN D3) 1.25 MG (50000 UT) CAPS Take 1 capsule by mouth once a week. 10/30/21  Yes [provider]  clopidogrel (PLAVIX) 75 MG tablet Take 1 tablet (75 mg total) by mouth every morning. 09/27/13  Yes Nita Sells, MD  escitalopram (LEXAPRO) 10 MG tablet Take 10 mg by mouth daily. 05/16/20  Yes [provider]  ferrous sulfate 325 (65 FE) MG tablet Take 325 mg by mouth daily with breakfast.   Yes [provider]  furosemide (LASIX) 40 MG tablet Take 1 tablet (40 mg total) by mouth 2 (two) times daily. 06/05/20  Yes Johnson, Clanford L, MD  gabapentin (NEURONTIN) 100 MG capsule Take 100 mg by mouth daily. 10/30/21  Yes [provider]  metFORMIN (GLUCOPHAGE) 500 MG tablet Take 500 mg by mouth daily with breakfast.   Yes [provider]  NITROSTAT 0.4 MG SL tablet Place 0.4 mg under the tongue every 5 (five) minutes as needed for chest pain.  05/23/10  Yes [provider]  omeprazole (PRILOSEC) 20 MG capsule Take 20 mg by mouth daily.  08/28/13  Yes [provider]  rosuvastatin (CRESTOR) 20 MG tablet Take 1 tablet (20 mg total) by mouth daily. 12/12/13  Yes Satira Sark, MD  umeclidinium-vilanterol (ANORO ELLIPTA) 62.5-25 MCG/INH AEPB Inhale 1 puff into the lungs daily.   Yes [provider]  finasteride (PROSCAR) 5 MG tablet Take 5 mg by mouth daily. Patient not taking: Reported on 11/05/2021    [provider]      Allergies    Penicillins    Review of Systems   Review of Systems  Respiratory:  Positive for shortness of breath.   Cardiovascular:  Positive for chest pain.  All other systems reviewed and are negative.   Physical Exam Updated Vital Signs BP 125/81   Pulse 76   Temp (!) 97.5 F (36.4 C) (Oral)   Resp (!) 21   Ht '5\' 7"'$  (1.702 m)   Wt 74.8 kg   SpO2 91%   BMI 25.84 kg/m  Physical Exam Vitals and nursing note reviewed.  Constitutional:       General: He is in acute distress.     Appearance: Normal appearance.  HENT:     Head: Normocephalic and atraumatic.  Eyes:     General:        Right eye: No discharge.        Left eye: No discharge.  Cardiovascular:     Comments: Regular rate and rhythm.  S1/S2 are distinct without any evidence of murmur, rubs, or gallops.  Radial pulses are 2+ bilaterally.  Dorsalis pedis pulses are 2+ bilaterally.  No evidence of pedal edema. Pulmonary:     Effort: Tachypnea and respiratory distress present.     Breath sounds: Wheezing present.  Abdominal:     General: Abdomen is flat. Bowel sounds are normal. There is no distension.     Tenderness: There is no abdominal tenderness. There is no guarding or rebound.  Musculoskeletal:        General: Normal range of motion.     Cervical back: Neck supple.  Skin:    General: Skin is warm and dry.     Findings: No rash.  Neurological:     General: No focal deficit present.     Mental Status: He is alert.  Psychiatric:        Mood and Affect: Mood normal.        Behavior: Behavior normal.     ED Results / Procedures / Treatments   Labs (all labs ordered are listed, but only abnormal results are displayed) Labs Reviewed  COMPREHENSIVE METABOLIC PANEL - Abnormal; Notable for the following components:      Result Value   Potassium 3.2 (*)    Glucose, Bld 118 (*)    BUN 31 (*)    Creatinine, Ser 2.70 (*)    Calcium 8.7 (*)    Total Protein 6.2 (*)    Albumin 3.1 (*)    GFR, Estimated 23 (*)    All other components within normal limits  CBC WITH DIFFERENTIAL/PLATELET - Abnormal; Notable for the following components:   RBC 2.48 (*)    Hemoglobin 8.0 (*)    HCT 27.0 (*)    MCV 108.9 (*)    MCHC 29.6 (*)    Lymphs Abs 0.6 (*)    All other components within normal limits  BRAIN NATRIURETIC PEPTIDE - Abnormal; Notable for the following components:   B Natriuretic Peptide 736.0 (*)    All other components within normal limits  TROPONIN I  (HIGH SENSITIVITY) - Abnormal; Notable for the following components:   Troponin I (High Sensitivity) 18 (*)    All other components within normal limits  TROPONIN I (HIGH SENSITIVITY) - Abnormal; Notable for the following components:   Troponin I (High Sensitivity)  18 (*)    All other components within normal limits  RESP PANEL BY RT-PCR (FLU A&B, COVID) ARPGX2  URINALYSIS, ROUTINE W REFLEX MICROSCOPIC  VITAMIN B12  FOLATE  IRON AND TIBC  FERRITIN  RETICULOCYTES  POC OCCULT BLOOD, ED  TYPE AND SCREEN  PREPARE RBC (CROSSMATCH)    EKG None  Radiology DG Chest 2 View  Result Date: 11/05/2021 CLINICAL DATA:  Dyspnea, chest pain, shortness of breath intermittently for past few days, 84% oxygenation on room air, crackles throughout EXAM: CHEST - 2 VIEW COMPARISON:  06/03/2020 FINDINGS: Enlargement of cardiac silhouette post median sternotomy. Coronary stent noted. Atherosclerotic calcifications aorta. Chronic interstitial prominence in the mid to lower lungs, stable. No acute infiltrate, pleural effusion, or pneumothorax. Bones demineralized. IMPRESSION: Enlargement of cardiac silhouette with chronic interstitial changes at the mid to lower lungs. No acute abnormalities. Electronically Signed   By: Lavonia Dana M.D.   On: 11/05/2021 14:48    Procedures .Critical Care  Performed by: Hendricks Limes, PA-C Authorized by: Hendricks Limes, PA-C   Critical care provider statement:    Critical care time (minutes):  35   Critical care time was exclusive of:  Separately billable procedures and treating other patients   Critical care was necessary to treat or prevent imminent or life-threatening deterioration of the following conditions:  Respiratory failure   Critical care was time spent personally by me on the following activities:  Blood draw for specimens, development of treatment plan with patient or surrogate, discussions with consultants, ordering and performing treatments and  interventions, ordering and review of laboratory studies, ordering and review of radiographic studies, pulse oximetry and re-evaluation of patient's condition     Medications Ordered in ED Medications  0.9 %  sodium chloride infusion (Manually program via Guardrails IV Fluids) (has no administration in time range)  furosemide (LASIX) injection 20 mg (has no administration in time range)  furosemide (LASIX) injection 20 mg (has no administration in time range)  pantoprazole (PROTONIX) 80 mg /NS 100 mL IVPB (has no administration in time range)  pantoprozole (PROTONIX) 80 mg /NS 100 mL infusion (has no administration in time range)  pantoprazole (PROTONIX) injection 40 mg (has no administration in time range)  methylPREDNISolone sodium succinate (SOLU-MEDROL) 125 mg/2 mL injection 80 mg (has no administration in time range)  sodium chloride 0.9 % bolus 500 mL (500 mLs Intravenous New Bag/Given 11/05/21 1558)  potassium chloride (KLOR-CON) packet 60 mEq (60 mEq Oral Given 11/05/21 1728)    ED Course/ Medical Decision Making/ A&P Clinical Course as of 11/05/21 1947  Wed Nov 05, 2021  1846 CBC with Differential(!) No evidence of leukocytosis.  There is a significant drop in hemoglobin in comparison to last results. [CF]  1846 Comprehensive metabolic panel(!) Elevated creatinine up from baseline.  Evidence of hypokalemia. [CF]  1846 Brain natriuretic peptide(!) Elevated meets criteria for CHF. [CF]  1847 Resp Panel by RT-PCR (Flu A&B, Covid) Anterior Nasal Swab Negative. [CF]  1847 Troponin I (High Sensitivity)(!) Initial and delta troponin slightly elevated. [CF]  1847 DG Chest 2 View I personally ordered and interpreted a chest x-ray which does not reveal any signs of pneumonia.  There is a enlarged cardiac silhouette.  I do agree with the radiologist interpretation. [CF]  1909 I spoke with Dr. Waldron Labs with [facility] hospitalist who agrees to admit the patient. He recommends getting a POC  occult.  [CF]    Clinical Course User Index [CF] Hendricks Limes, [place]  Medical Decision Making Donald Gaines is a 81 y.o. male patient who presents to the emergency department today for further evaluation of exertional shortness of breath and chest pain.  Differential diagnosis includes CHF, ACS, pneumonia, COPD exacerbation.  Patient arrived on BiPAP.  BiPAP was quickly weaned and patient was placed on 4 L of oxygen and satting on 100% on room air.  Patient is hypotensive and tachypneic.  He is afebrile.  We will get cardiac labs in addition to chest x-ray. I will plan to reassess frequently.   This could be related to either new onset anemia from bleed or CHF.  Patient does not clinically appear volume overloaded today.  BNP does support CHF.  Did have to give the patient fluids for hypotension.  I did improve with half a liter bolus.  Given the clinical scenario, I do feel the patient would likely benefit from further evaluation in the hospital.  I will speak with the hospitalist to get him admitted.  Amount and/or Complexity of Data Reviewed Labs: ordered. Decision-making details documented in ED Course. Radiology: ordered. Decision-making details documented in ED Course.  Risk Prescription drug management. Decision regarding hospitalization.    Final Clinical Impression(s) / ED Diagnoses Final diagnoses:  Exertional dyspnea  Acute on chronic respiratory failure, unspecified whether with hypoxia or hypercapnia (HCC)  Elevated brain natriuretic peptide (BNP) level  Anemia, unspecified type    Rx / DC Orders ED Discharge Orders     None         Hendricks Limes, PA-C 11/05/21 1947    Hendricks Limes, PA-C 11/05/21 1947    Teressa Lower, MD 11/05/21 [phone]

## 2021-11-05 NOTE — ED Triage Notes (Signed)
Pt arrives via [facility] for CP and SOB. Pt states this has been intermittent for the last few days. Pt states for the last three days he has self administered on SL nitro with relief. On EMS arrival pt was 84% on RA with crackles throughout. Pt was placed on CPAP with improvement to 100%. Pt oxygen levels WNL on [place] during triage. Pt states that he has had productive cough, tactile fever, and diarrhea for several masks.

## 2021-11-05 NOTE — H&P (Signed)
TRH H&P   Patient Demographics:    Donald Gaines, is a 81 y.o. male  MRN: [id number]   DOB - April 15, 1940  Admit Date - 11/05/2021  Outpatient Primary MD for the patient is Celene Squibb, MD  Referring MD/NP/PA: [place] Specialists: Cardiology dr Domenic Polite, GI Dr Laural Golden    Patient coming from: home  Chief Complaint  Patient presents with   Chest Pain   Shortness of Breath      HPI:    Donald Gaines  is a 81 y.o. male,  with medical history significant for COPD, CAD, prediabetes, hypertension, atrial fibrillation not on anticoagulation due to chronic blood loss anemia from AVM, and tobacco use, down to 1 cigarette/day, chronic hypoxic respiratory failure on 3 L oxygen at baseline. -Patient presents to ED secondary to complaints of chest pain and shortness of breath reports he has been having exertional dyspnea going on for last 2 to 3 weeks, at baseline he is able to walk to the mailbox with mild dyspnea, at baseline 2 to 3 L nasal cannula at home, reports symptoms has been worsening much recently, reports currently he is even dyspneic at rest, as well he does report some chest pain, mainly with activity, upon further questioning he does report melena and dark-colored stools, he denies nausea, vomiting, diarrhea or abdominal pain, no fever or chills -In ED work-up significant for EKG showing A-fib (known permanent A-fib at baseline), work-up significant for worsening creatinine at 2.7, baseline is 1.9, as well BUN elevated at 31, he noted to have melena, Hemoccult positive stool, [facility] hospitalist consulted to admit.    Review of systems:    .  A full 10 point Review of Systems was done, except as stated above, all other Review of Systems were negative.   With Past History of the following :    Past Medical History:  Diagnosis Date   Arteriovenous malformation  small bowel    Capsule study 3/10   Bilateral carotid artery stenosis    BPH (benign prostatic hypertrophy)    Candida esophagitis (HCC)    Cataract    Chronic anemia    Chronic diarrhea    COPD (chronic obstructive pulmonary disease) (HCC)    Coronary atherosclerosis of native coronary artery    Multivessel, BMS SVG TO RCA 2000, reportedly  "2" additional stents in interim   Dementia ([place])    Depression    Diabetes mellitus, type 2 ([place])    Essential hypertension    GERD (gastroesophageal reflux disease)    GI bleeding    Cecal ulcers, arteriovenous malformations   Glaucoma    Hemorrhoids    Hyperlipidemia    Myocardial infarction ([place])    1994   Portal hypertensive gastropathy ([place]) 11/08/2012   EGD. Dr. Laural Golden   Restrictive lung disease    Sleep apnea    TIA (transient ischemic attack)  2005      Past Surgical History:  Procedure Laterality Date   CAPSULE ENDO  03/10   cardiac catherization  04/2012   CARDIAC CATHETERIZATION  04/05/1996 [name]   EF 45%, occluded SVG to circumflex, patent SVG to D1 and PDA and patent LIMA to LAD with severe native vessel disease.   CARDIAC CATHETERIZATION  08/22/1998 [name]   Mild LM, severe LAD,severe CX, occlude RCA, patient  SVG to diatal RCA, patent vein graft to D1 and patent LIMA to LAD.    CATARACT EXTRACTION W/PHACO Right 09/22/2012   Procedure: CATARACT EXTRACTION PHACO AND INTRAOCULAR LENS PLACEMENT (IOC);  Surgeon: Tonny Branch, MD;  Location: [facility];  Service: Ophthalmology;  Laterality: Right;  CDE: 11.43   CATARACT EXTRACTION W/PHACO Left 10/17/2012   Procedure: CATARACT EXTRACTION PHACO AND INTRAOCULAR LENS PLACEMENT (IOC);  Surgeon: Tonny Branch, MD;  Location: [facility];  Service: Ophthalmology;  Laterality: Left;  CDE: 8.06   COLONOSCOPY  APR 2008   SIMPLE ADENOMA, [place] TICS, IH   COLONOSCOPY  FEB 2008 ANEMIA. [place]LE[place]    2 LARGE ILEOCEAL ULCERS 2o to ASA, [place] TICS, IH   COLONOSCOPY  SEP 2009 TRANSFUSION DEP ANEMIA    AC AVM-ABLATED, [place] TICS, IH   COLONOSCOPY WITH ESOPHAGOGASTRODUODENOSCOPY (EGD) N/A 11/08/2012   Procedure: COLONOSCOPY WITH ESOPHAGOGASTRODUODENOSCOPY (EGD);  Surgeon: Rogene Houston, MD;  Location: [facility];  Service: Endoscopy;  Laterality: N/A;  250-rescheduled to 7:30 Ann to notify pt   CORONARY ANGIOPLASTY WITH STENT PLACEMENT  08/22/1998 [name]   TEC stenting of the SVG to RCA-Last seen by cardiologist in 2010.   CORONARY ARTERY BYPASS GRAFT  1995-triple bypass   LIMA-LAD, LIMA to intermediate branch, SVG to PD and second OM,   ESOPHAGOGASTRODUODENOSCOPY  SEP 09   DUODENAL LIPOMA   ESOPHAGOGASTRODUODENOSCOPY N/A 09/27/2013   Procedure: ESOPHAGOGASTRODUODENOSCOPY (EGD) Push enteroscopy;  Surgeon: Rogene Houston, MD;  [facility];  Service: Endoscopy;  Laterality: N/A;   GIVENS CAPSULE STUDY N/A 11/24/2012   Procedure: GIVENS CAPSULE STUDY;  Surgeon: Rogene Houston, MD;  Location: [facility];  Service: Endoscopy;  Laterality: N/A;  [place]  09/27/2013   Procedure: HOT HEMOSTASIS (ARGON PLASMA COAGULATION/BICAP);  Surgeon: Rogene Houston, MD;  Location: [facility];  Service: Endoscopy;;   HYDROGEN BREATH TEST  2009   RIGHT INGUINAL HERNIA REPAIR     UPPER GASTROINTESTINAL ENDOSCOPY  SEP 2009   GASTRIC AVM ABLATED, NL DUO Bx      Social History:     Social History   Tobacco Use   Smoking status: Every Day    Packs/day: 1.00    Years: 60.00    Total pack years: 60.00    Types: Cigarettes    Start date: 01/02/1949   Smokeless tobacco: Never   Tobacco comments:    1 pack a day  Substance Use Topics   Alcohol use: No    Alcohol/week: 0.0 standard drinks of alcohol    Comment: No etoh in 12 yrs.      Family History :     Family History  Problem Relation Age of Onset   Heart attack Mother    Heart disease Mother    Hypertension Mother      Home Medications:   Prior to Admission medications   Medication Sig Start Date End Date  Taking? Authorizing Provider  acetaminophen (TYLENOL) 500 MG tablet Take 500 mg by mouth every 6 (six)  hours as needed for headache.    [provider]  albuterol (PROVENTIL) (2.5 MG/3ML) 0.083% nebulizer solution Take 3 mLs (2.5 mg total) by nebulization every 2 (two) hours as needed for wheezing or shortness of breath. 06/05/20   Johnson, Clanford L, MD  ALPRAZolam Duanne Moron) 0.5 MG tablet Take 0.5 mg by mouth 2 (two) times daily as needed for anxiety.    [provider]  carvedilol (COREG) 12.5 MG tablet Take 12.5 mg by mouth 2 (two) times daily with a meal.  03/26/14   [provider]  cetirizine (ZYRTEC) 10 MG tablet Take 10 mg by mouth at bedtime.     [provider]  clopidogrel (PLAVIX) 75 MG tablet Take 1 tablet (75 mg total) by mouth every morning. 09/27/13   Nita Sells, MD  escitalopram (LEXAPRO) 10 MG tablet Take 10 mg by mouth daily. 05/16/20   [provider]  ferrous sulfate 325 (65 FE) MG tablet Take 325 mg by mouth daily with breakfast.    [provider]  finasteride (PROSCAR) 5 MG tablet Take 5 mg by mouth daily.    [provider]  furosemide (LASIX) 40 MG tablet Take 1 tablet (40 mg total) by mouth 2 (two) times daily. 06/05/20   Johnson, Clanford L, MD  metFORMIN (GLUCOPHAGE) 500 MG tablet Take 500 mg by mouth daily with breakfast.    [provider]  NITROSTAT 0.4 MG SL tablet Place 0.4 mg under the tongue every 5 (five) minutes as needed for chest pain.  05/23/10   [provider]  omeprazole (PRILOSEC) 20 MG capsule Take 20 mg by mouth daily.  08/28/13   [provider]  rosuvastatin (CRESTOR) 20 MG tablet Take 1 tablet (20 mg total) by mouth daily. 12/12/13   Satira Sark, MD  umeclidinium-vilanterol (ANORO ELLIPTA) 62.5-25 MCG/INH AEPB Inhale 1 puff into the lungs daily.    [provider]     Allergies:     Allergies  Allergen Reactions   Penicillins Rash    Has  patient had a PCN reaction causing immediate rash, facial/tongue/throat swelling, SOB or lightheadedness with hypotension: Yes Has patient had a PCN reaction causing severe rash involving mucus membranes or skin necrosis: No Has patient had a PCN reaction that required hospitalization No Has patient had a PCN reaction occurring within the last 10 years: No If all of the above answers are "NO", then may proceed with Cephalosporin use.      Physical Exam:   Vitals  Blood pressure 125/81, pulse 76, temperature (!) 97.5 F (36.4 C), temperature source Oral, resp. rate (!) 21, height '5\' 7"'$  (1.702 m), weight 74.8 kg, SpO2 91 %.   1. General, ill-appearing, pale looking male, laying in bed mildly dyspneic.  2. Normal affect and insight, Not Suicidal or Homicidal, Awake Alert, Oriented X 3.  3. No F.N deficits, ALL C.Nerves Intact, Strength 5/5 all 4 extremities, Sensation intact all 4 extremities, Plantars down going.  4. Ears and Eyes appear Normal, Conjunctivae clear, PERRLA. Moist Oral Mucosa.  5. Supple Neck, No JVD, No cervical lymphadenopathy appriciated, No Carotid Bruits.  6. Symmetrical Chest wall movement, air entry bilaterally  7.  Irregular irregular no Gallops, Rubs or Murmurs, No Parasternal Heave.  8. Positive Bowel Sounds, Abdomen Soft, No tenderness, No organomegaly appriciated,No rebound -guarding or rigidity.  9.  No Cyanosis, Normal Skin Turgor, No Skin Rash or Bruise.  10. Good muscle tone,  joints appear normal , no effusions, Normal  ROM.       Data Review:    CBC Recent Labs  Lab 11/05/21 1411  WBC 5.6  HGB 8.0*  HCT 27.0*  PLT 160  MCV 108.9*  MCH 32.3  MCHC 29.6*  RDW 15.4  LYMPHSABS 0.6*  MONOABS 0.5  EOSABS 0.1  BASOSABS 0.0   ------------------------------------------------------------------------------------------------------------------  Chemistries  Recent Labs  Lab 11/05/21 1411  NA 140  K 3.2*  CL 101  CO2 31  GLUCOSE 118*   BUN 31*  CREATININE 2.70*  CALCIUM 8.7*  AST 16  ALT 9  ALKPHOS 46  BILITOT 0.7   ------------------------------------------------------------------------------------------------------------------ estimated creatinine clearance is 21.1 mL/min (A) (by C-G formula based on SCr of 2.7 mg/dL (H)). ------------------------------------------------------------------------------------------------------------------ No results for input(s): "TSH", "T4TOTAL", "T3FREE", "THYROIDAB" in the last 72 hours.  Invalid input(s): "FREET3"  Coagulation profile No results for input(s): "INR", "PROTIME" in the last 168 hours. ------------------------------------------------------------------------------------------------------------------- No results for input(s): "DDIMER" in the last 72 hours. -------------------------------------------------------------------------------------------------------------------  Cardiac Enzymes No results for input(s): "CKMB", "TROPONINI", "MYOGLOBIN" in the last 168 hours.  Invalid input(s): "CK" ------------------------------------------------------------------------------------------------------------------    Component Value Date/Time   BNP 736.0 (H) 11/05/2021 1411     ---------------------------------------------------------------------------------------------------------------  Urinalysis    Component Value Date/Time   COLORURINE YELLOW 07/27/2015 1020   APPEARANCEUR CLEAR 07/27/2015 1020   LABSPEC 1.010 07/27/2015 1020   PHURINE 6.0 07/27/2015 1020   GLUCOSEU NEGATIVE 07/27/2015 1020   HGBUR MODERATE (A) 07/27/2015 1020   BILIRUBINUR NEGATIVE 07/27/2015 1020   KETONESUR NEGATIVE 07/27/2015 1020   PROTEINUR 100 (A) 07/27/2015 1020   UROBILINOGEN 1.0 12/25/2014 2210   NITRITE NEGATIVE 07/27/2015 1020   LEUKOCYTESUR NEGATIVE 07/27/2015 1020     ----------------------------------------------------------------------------------------------------------------   Imaging Results:    DG Chest 2 View  Result Date: 11/05/2021 CLINICAL DATA:  Dyspnea, chest pain, shortness of breath intermittently for past few days, 84% oxygenation on room air, crackles throughout EXAM: CHEST - 2 VIEW COMPARISON:  06/03/2020 FINDINGS: Enlargement of cardiac silhouette post median sternotomy. Coronary stent noted. Atherosclerotic calcifications aorta. Chronic interstitial prominence in the mid to lower lungs, stable. No acute infiltrate, pleural effusion, or pneumothorax. Bones demineralized. IMPRESSION: Enlargement of cardiac silhouette with chronic interstitial changes at the mid to lower lungs. No acute abnormalities. Electronically Signed   By: Lavonia Dana M.D.   On: 11/05/2021 14:48    My personal review of EKG: Rhythm A fib Rate  84 /min, QTc 490 , no Acute ST changes   Assessment & Plan:    Principal Problem:   CHF (congestive heart failure) (HCC) Active Problems:   Type 2 diabetes mellitus (HCC)   Iron deficiency anemia due to chronic blood loss   TOBACCO ABUSE   GERD   Congenital anomaly of the peripheral vascular system   Coronary atherosclerosis of native coronary artery   COPD (chronic obstructive pulmonary disease) (HCC)   ARF (acute renal failure) (HCC)   CKD (chronic kidney disease) stage 3, GFR 30-59 ml/min (HCC)   Acute blood loss anemia GI bleed Chronic iron deficiency anemia -Presents with symptomatic anemia, dyspnea and chest pain, he had drop in his hemoglobin from 11.6 last year to 8 this admission -He is with known history of AVM in the past. -He is symptomatic, will transfuse 2 units PRBC after obtaining anemia panel. -We will start him empirically on Protonix drip. -Hold Plavix, will use SCD for DVT prophylaxis -GI consulted, their will evaluate tomorrow likely will end needing upper endoscopy tomorrow, will keep on  clear liquid diet for now, and n.p.o. after midnight  AKI on CKD stage III -Baseline creatinine 1.8-1.9, it is 2.7 on admission -PRBC transfusion should help with volume resuscitation -Hold nephrotoxic medications  History of CAD -Chest pain most likely related to his anemia, we will continue to cycle troponins -Hold Plavix due to GI bleed   Chronic hypoxic respiratory failure -At baseline, on 3 L nasal cannula -But he is dyspneic, due to above and COPD exacerbation  COPD exacerbation  -Diminished air entry bilaterally, will start on steroids, scheduled DuoNebs and as needed albuterol  Chronic diastolic CHF -Euvolemic, will hold on further diuresis  DM II  -Hold metformin, check A1c and keep an insulin sliding scale  Tobacco abuse -Counseled   GERD  -Continue with PPI   Essential hypertension -Patient is acceptable, continue with Coreg  -Hold lisinopril   hyperlipidemia -Continue with statin   Permanent atrial fibrillation with CHA2DS2-VASc score of 6.   -Blood by cardiology, he is not on anticoagulation due to recurrent GI bleed from AVM per most recent cardiology note this February.    Hypokalemia -Repleted  DVT Prophylaxis  SCDs   AM Labs Ordered, also please review Full Orders  Family Communication: Admission, patients condition and plan of care including tests being ordered have been discussed with the patient and sister by phone who indicate understanding and agree with the plan and Code Status.  Code Status DNR( confirmed by patient) and discussed with sister by phone as well  Likely DC to  Home  Condition GUARDED    Consults called: [facility]    Admission status: inpatient    Time spent in minutes : 75 minutes   Phillips Climes M.D on 11/05/2021 at 7:20 PM   [facility] - Office  [phone]

## 2021-11-06 ENCOUNTER — Inpatient Hospital Stay (HOSPITAL_COMMUNITY): Payer: Medicare HMO | Admitting: Certified Registered Nurse Anesthetist

## 2021-11-06 ENCOUNTER — Encounter (HOSPITAL_COMMUNITY)
Admission: EM | Disposition: A | Discharge status: Home / Self Care | Payer: Self-pay | Source: Home / Self Care | Attending: Internal Medicine

## 2021-11-06 ENCOUNTER — Encounter (HOSPITAL_COMMUNITY): Payer: Self-pay | Admitting: Internal Medicine

## 2021-11-06 ENCOUNTER — Other Ambulatory Visit: Payer: Self-pay

## 2021-11-06 DIAGNOSIS — D62 Acute posthemorrhagic anemia: Secondary | ICD-10-CM | POA: Diagnosis not present

## 2021-11-06 DIAGNOSIS — I4891 Unspecified atrial fibrillation: Secondary | ICD-10-CM

## 2021-11-06 DIAGNOSIS — K922 Gastrointestinal hemorrhage, unspecified: Secondary | ICD-10-CM | POA: Diagnosis not present

## 2021-11-06 DIAGNOSIS — D500 Iron deficiency anemia secondary to blood loss (chronic): Secondary | ICD-10-CM | POA: Diagnosis not present

## 2021-11-06 DIAGNOSIS — J449 Chronic obstructive pulmonary disease, unspecified: Secondary | ICD-10-CM

## 2021-11-06 DIAGNOSIS — I5030 Unspecified diastolic (congestive) heart failure: Secondary | ICD-10-CM | POA: Diagnosis not present

## 2021-11-06 DIAGNOSIS — F17210 Nicotine dependence, cigarettes, uncomplicated: Secondary | ICD-10-CM

## 2021-11-06 HISTORY — PX: GIVENS CAPSULE STUDY: SHX5432

## 2021-11-06 HISTORY — PX: ESOPHAGOGASTRODUODENOSCOPY (EGD) WITH PROPOFOL: SHX5813

## 2021-11-06 LAB — GLUCOSE, CAPILLARY
Glucose-Capillary: 153 mg/dL — ABNORMAL HIGH (ref 70–99)
Glucose-Capillary: 130 mg/dL — ABNORMAL HIGH (ref 70–99)
Glucose-Capillary: 147 mg/dL — ABNORMAL HIGH (ref 70–99)
Glucose-Capillary: 143 mg/dL — ABNORMAL HIGH (ref 70–99)

## 2021-11-06 LAB — BASIC METABOLIC PANEL
Anion gap: 9 (ref 5–15)
BUN: 31 mg/dL — ABNORMAL HIGH (ref 8–23)
CO2: 27 mmol/L (ref 22–32)
Calcium: 8.6 mg/dL — ABNORMAL LOW (ref 8.9–10.3)
Chloride: 104 mmol/L (ref 98–111)
Creatinine, Ser: 2.77 mg/dL — ABNORMAL HIGH (ref 0.61–1.24)
GFR, Estimated: 23 mL/min — ABNORMAL LOW (ref >60)
Glucose, Bld: 162 mg/dL — ABNORMAL HIGH (ref 70–99)
Potassium: 4.5 mmol/L (ref 3.5–5.1)
Sodium: 140 mmol/L (ref 135–145)

## 2021-11-06 LAB — HEMOGLOBIN A1C
Hgb A1c MFr Bld: 5.1 % (ref 4.8–5.6)
Mean Plasma Glucose: 99.67 mg/dL

## 2021-11-06 LAB — CBC
HCT: 31.5 % — ABNORMAL LOW (ref 39.0–52.0)
Hemoglobin: 9.4 g/dL — ABNORMAL LOW (ref 13.0–17.0)
MCH: 32.2 pg (ref 26.0–34.0)
MCHC: 29.8 g/dL — ABNORMAL LOW (ref 30.0–36.0)
MCV: 107.9 fL — ABNORMAL HIGH (ref 80.0–100.0)
Platelets: 138 10*3/uL — ABNORMAL LOW (ref 150–400)
RBC: 2.92 MIL/uL — ABNORMAL LOW (ref 4.22–5.81)
RDW: 17.8 % — ABNORMAL HIGH (ref 11.5–15.5)
WBC: 4.5 10*3/uL (ref 4.0–10.5)
nRBC: 0 % (ref 0.0–0.2)

## 2021-11-06 LAB — PREPARE RBC (CROSSMATCH)

## 2021-11-06 SURGERY — ESOPHAGOGASTRODUODENOSCOPY (EGD) WITH PROPOFOL
Anesthesia: General

## 2021-11-06 SURGERY — IMAGING PROCEDURE, GI TRACT, INTRALUMINAL, VIA CAPSULE

## 2021-11-06 MED ORDER — SODIUM CHLORIDE 0.9 % IV SOLN: INTRAVENOUS | Status: DC

## 2021-11-06 MED ORDER — IPRATROPIUM-ALBUTEROL 0.5-2.5 (3) MG/3ML IN SOLN
3.0000 mL | Freq: Four times a day (QID) | RESPIRATORY_TRACT | Status: DC | PRN
Start: 2021-11-06 — End: 2021-11-08
  Administered 2021-11-07 – 2021-11-08 (×2): 3 mL via RESPIRATORY_TRACT
  Filled 2021-11-06 (×3): qty 3

## 2021-11-06 MED ORDER — PROPOFOL 10 MG/ML IV BOLUS
INTRAVENOUS | Status: DC | PRN
  Administered 2021-11-06: 60 mg via INTRAVENOUS

## 2021-11-06 MED ORDER — FUROSEMIDE 10 MG/ML IJ SOLN
20.0000 mg | Freq: Once | INTRAMUSCULAR | Status: AC
  Administered 2021-11-06: 20 mg via INTRAVENOUS
  Filled 2021-11-06: qty 2

## 2021-11-06 MED ORDER — IPRATROPIUM-ALBUTEROL 0.5-2.5 (3) MG/3ML IN SOLN
3.0000 mL | Freq: Four times a day (QID) | RESPIRATORY_TRACT | Status: DC
Start: 2021-11-06 — End: 2021-11-06
  Administered 2021-11-06: 3 mL via RESPIRATORY_TRACT
  Filled 2021-11-06 (×2): qty 3

## 2021-11-06 MED ORDER — PROPOFOL 500 MG/50ML IV EMUL
INTRAVENOUS | Status: DC | PRN
  Administered 2021-11-06: 150 ug/kg/min via INTRAVENOUS

## 2021-11-06 MED ORDER — SODIUM CHLORIDE 0.9 % IV SOLN: INTRAVENOUS | Status: DC | PRN

## 2021-11-06 MED ORDER — LIDOCAINE VISCOUS HCL 2 % MT SOLN: 15.0000 mL | Freq: Once | OROMUCOSAL | Status: AC

## 2021-11-06 MED ORDER — LIDOCAINE VISCOUS HCL 2 % MT SOLN
OROMUCOSAL | Status: AC
  Filled 2021-11-06: qty 15

## 2021-11-06 MED ORDER — LACTATED RINGERS IV SOLN: INTRAVENOUS | Status: DC

## 2021-11-06 NOTE — Care Management Important Message (Signed)
Important Message  Patient Details  Name: Donald Gaines MRN: [id number] Date of Birth: Aug 12, 1940   Medicare Important Message Given:  Yes     [name] 11/06/2021, 3:43 PM

## 2021-11-06 NOTE — Plan of Care (Signed)
  Problem: Acute Rehab PT Goals(only PT should resolve) Goal: Pt Will Go Supine/Side To Sit Outcome: Progressing Flowsheets (Taken 11/06/2021 1141) Pt will go Supine/Side to Sit: Independently Goal: Patient Will Transfer Sit To/From Stand Outcome: Progressing Flowsheets (Taken 11/06/2021 1141) Patient will transfer sit to/from stand:  with modified independence  with supervision Goal: Pt Will Transfer Bed To Chair/Chair To Bed Outcome: Progressing Flowsheets (Taken 11/06/2021 1141) Pt will Transfer Bed to Chair/Chair to Bed:  with modified independence  with supervision Goal: Pt Will Ambulate Outcome: Progressing Flowsheets (Taken 11/06/2021 1141) Pt will Ambulate:  > 125 feet  with modified independence  with supervision  with rolling walker  with cane  with least restrictive assistive device   11:41 AM, 11/06/21 Lonell Grandchild, MPT Physical Therapist with [facility] [phone] office [phone] mobile phone

## 2021-11-06 NOTE — Interval H&P Note (Signed)
History and Physical Interval Note:  11/06/2021 11:56 AM  Donald Gaines  has presented today for surgery, with the diagnosis of anemia, heme + stool, melena.  The various methods of treatment have been discussed with the patient and family. After consideration of risks, benefits and other options for treatment, the patient has consented to  Procedure(s): ESOPHAGOGASTRODUODENOSCOPY (EGD) WITH PROPOFOL (N/A) as a surgical intervention.  The patient's history has been reviewed, patient examined, no change in status, stable for surgery.  I have reviewed the patient's chart and labs.  Questions were answered to the patient's satisfaction.     Eloise Harman

## 2021-11-06 NOTE — Progress Notes (Signed)
PROGRESS NOTE    Donald Gaines  CZY:[id number] DOB: 08-29-1940 DOA: 11/05/2021 PCP: Celene Squibb, MD   Brief Narrative:  Donald Gaines  is a 81 y.o. male,  with medical history significant for COPD, CAD, prediabetes, hypertension, atrial fibrillation not on anticoagulation due to chronic blood loss anemia from AVM, and tobacco use, down to 1 cigarette/day, chronic hypoxic respiratory failure on 3 L oxygen at baseline.  Patient presenting with complaints of chest pain or shortness of breath as well as exertional dyspnea and was admitted with symptomatic acute blood loss anemia secondary to GI bleed along with AKI on CKD stage III.  He underwent EGD on 8/17 with no significant findings and is now due to undergo capsule study.  Assessment & Plan:   Principal Problem:   CHF (congestive heart failure) (HCC) Active Problems:   Type 2 diabetes mellitus (HCC)   Iron deficiency anemia due to chronic blood loss   TOBACCO ABUSE   GERD   Congenital anomaly of the peripheral vascular system   Coronary atherosclerosis of native coronary artery   COPD (chronic obstructive pulmonary disease) (HCC)   ARF (acute renal failure) (HCC)   CKD (chronic kidney disease) stage 3, GFR 30-59 ml/min (HCC)   GI bleed  Assessment and Plan:   Acute symptomatic blood loss anemia GI bleed Chronic iron deficiency anemia -Presents with symptomatic anemia, dyspnea and chest pain, he had drop in his hemoglobin from 11.6 last year to 8 this admission -He is with known history of AVM in the past. -Status post 1 unit PRBC transfusion with improvement noted -PPI twice daily per GI -Hold Plavix, will use SCD for DVT prophylaxis -GI has done endoscopy on patient 8/17 and now start capsule study   AKI on CKD stage III -Baseline creatinine 1.8-1.9, it is 2.7 on admission -PRBC transfusion should help with volume resuscitation -Hold nephrotoxic medications   History of CAD -Chest pain most likely related to his  anemia, we will continue to cycle troponins -Hold Plavix due to GI bleed   Chronic hypoxic respiratory failure -At baseline, on 3 L nasal cannula -But he is dyspneic, due to above and COPD exacerbation   COPD exacerbation  -Diminished air entry bilaterally, will start on steroids, scheduled DuoNebs and as needed albuterol   Chronic diastolic CHF -Euvolemic, will hold on further diuresis   DM II  -Hold metformin, check A1c and keep an insulin sliding scale   Tobacco abuse -Counseled     GERD  -Continue with PPI   Essential hypertension-controlled -Patient is acceptable, continue with Coreg  -Hold lisinopril    hyperlipidemia -Continue with statin    Permanent atrial fibrillation with CHA2DS2-VASc score of 6.   -Blood by cardiology, he is not on anticoagulation due to recurrent GI bleed from AVM per most recent cardiology note this February.     DVT prophylaxis: SCDs Code Status: DNR Family Communication: None at bedside Disposition Plan: Undergoing GI evaluation Status is: Inpatient Remains inpatient appropriate because: Need for ongoing GI evaluation.  IV medications.   Consultants:  GI  Procedures:  EGD 8/17 Capsule study 8/17  Antimicrobials:  None   Subjective: Patient seen and evaluated today with no new acute complaints or concerns. No acute concerns or events noted overnight.  No further melanotic stools since admission.  He denies any further chest pain or shortness of breath after PRBC transfusion.  Objective: Vitals:   11/06/21 0256 11/06/21 0408 11/06/21 1148 11/06/21 1220  BP:  98/66 Marland Kitchen)  142/83 100/65  Pulse:  97 84   Resp:  '20 18 13  '$ Temp:  97.7 F (36.5 C) 97.6 F (36.4 C)   TempSrc:   Oral   SpO2: 94% 98% 97% 99%  Weight:   74.8 kg   Height:   '5\' 5"'$  (1.651 m)     Intake/Output Summary (Last 24 hours) at 11/06/2021 1230 Last data filed at 11/06/2021 1213 Gross per 24 hour  Intake 536.38 ml  Output 225 ml  Net 311.38 ml   Filed  Weights   11/05/21 1408 11/05/21 2041 11/06/21 1148  Weight: 74.8 kg 74.8 kg 74.8 kg    Examination:  General exam: Appears calm and comfortable  Respiratory system: Clear to auscultation. Respiratory effort normal.  4 L nasal cannula Cardiovascular system: S1 & S2 heard, RRR.  Gastrointestinal system: Abdomen is soft Central nervous system: Alert and awake Extremities: No edema Skin: No significant lesions noted Psychiatry: Flat affect.    Data Reviewed: I have personally reviewed following labs and imaging studies  CBC: Recent Labs  Lab 11/05/21 1411 11/06/21 0352  WBC 5.6 4.5  NEUTROABS 4.4  --   HGB 8.0* 9.4*  HCT 27.0* 31.5*  MCV 108.9* 107.9*  PLT 160 500*   Basic Metabolic Panel: Recent Labs  Lab 11/05/21 1411 11/06/21 0352  NA 140 140  K 3.2* 4.5  CL 101 104  CO2 31 27  GLUCOSE 118* 162*  BUN 31* 31*  CREATININE 2.70* 2.77*  CALCIUM 8.7* 8.6*   GFR: Estimated Creatinine Clearance: 20.8 mL/min (A) (by C-G formula based on SCr of 2.77 mg/dL (H)). Liver Function Tests: Recent Labs  Lab 11/05/21 1411  AST 16  ALT 9  ALKPHOS 46  BILITOT 0.7  PROT 6.2*  ALBUMIN 3.1*   No results for input(s): "LIPASE", "AMYLASE" in the last 168 hours. No results for input(s): "AMMONIA" in the last 168 hours. Coagulation Profile: No results for input(s): "INR", "PROTIME" in the last 168 hours. Cardiac Enzymes: No results for input(s): "CKTOTAL", "CKMB", "CKMBINDEX", "TROPONINI" in the last 168 hours. BNP (last 3 results) No results for input(s): "PROBNP" in the last 8760 hours. HbA1C: Recent Labs    11/05/21 1400  HGBA1C 5.1   CBG: Recent Labs  Lab 11/05/21 2059 11/06/21 0705 11/06/21 1100  GLUCAP 154* 153* 130*   Lipid Profile: No results for input(s): "CHOL", "HDL", "LDLCALC", "TRIG", "CHOLHDL", "LDLDIRECT" in the last 72 hours. Thyroid Function Tests: No results for input(s): "TSH", "T4TOTAL", "FREET4", "T3FREE", "THYROIDAB" in the last 72  hours. Anemia Panel: Recent Labs    11/05/21 1946  VITAMINB12 333  FOLATE 10.3  FERRITIN 12*  TIBC 431  IRON 65  RETICCTPCT 2.8   Sepsis Labs: No results for input(s): "PROCALCITON", "LATICACIDVEN" in the last 168 hours.  Recent Results (from the past 240 hour(s))  Resp Panel by RT-PCR (Flu A&B, Covid) Anterior Nasal Swab     Status: None   Collection Time: 11/05/21  2:25 PM   Specimen: Anterior Nasal Swab  Result Value Ref Range Status   SARS Coronavirus 2 by RT PCR NEGATIVE NEGATIVE Final    Comment: (NOTE) SARS-CoV-2 target nucleic acids are NOT DETECTED.  The SARS-CoV-2 RNA is generally detectable in upper respiratory specimens during the acute phase of infection. The lowest concentration of SARS-CoV-2 viral copies this assay can detect is 138 copies/mL. A negative result does not preclude SARS-Cov-2 infection and should not be used as the sole basis for treatment or other patient management decisions.  A negative result may occur with  improper specimen collection/handling, submission of specimen other than nasopharyngeal swab, presence of viral mutation(s) within the areas targeted by this assay, and inadequate number of viral copies(<138 copies/mL). A negative result must be combined with clinical observations, patient history, and epidemiological information. The expected result is Negative.  Fact Sheet for Patients:  EntrepreneurPulse.com.au  Fact Sheet for Healthcare Providers:  IncredibleEmployment.be  This test is no t yet approved or cleared by the [place] FDA and  has been authorized for detection and/or diagnosis of SARS-CoV-2 by FDA under an Emergency Use Authorization (EUA). This EUA will remain  in effect (meaning this test can be used) for the duration of the COVID-19 declaration under Section 564(b)(1) of the Act, 21 U.S.C.section 360bbb-3(b)(1), unless the authorization is terminated  or revoked sooner.        Influenza A by PCR NEGATIVE NEGATIVE Final   Influenza B by PCR NEGATIVE NEGATIVE Final    Comment: (NOTE) The Xpert Xpress SARS-CoV-2/FLU/RSV plus assay is intended as an aid in the diagnosis of influenza from Nasopharyngeal swab specimens and should not be used as a sole basis for treatment. Nasal washings and aspirates are unacceptable for Xpert Xpress SARS-CoV-2/FLU/RSV testing.  Fact Sheet for Patients: EntrepreneurPulse.com.au  Fact Sheet for Healthcare Providers: IncredibleEmployment.be  This test is not yet approved or cleared by the [place] FDA and has been authorized for detection and/or diagnosis of SARS-CoV-2 by FDA under an Emergency Use Authorization (EUA). This EUA will remain in effect (meaning this test can be used) for the duration of the COVID-19 declaration under Section 564(b)(1) of the Act, 21 U.S.C. section 360bbb-3(b)(1), unless the authorization is terminated or revoked.  Performed at [facility], [address], [place], [place] [address]          Radiology Studies: DG Chest 2 View  Result Date: 11/05/2021 CLINICAL DATA:  Dyspnea, chest pain, shortness of breath intermittently for past few days, 84% oxygenation on room air, crackles throughout EXAM: CHEST - 2 VIEW COMPARISON:  06/03/2020 FINDINGS: Enlargement of cardiac silhouette post median sternotomy. Coronary stent noted. Atherosclerotic calcifications aorta. Chronic interstitial prominence in the mid to lower lungs, stable. No acute infiltrate, pleural effusion, or pneumothorax. Bones demineralized. IMPRESSION: Enlargement of cardiac silhouette with chronic interstitial changes at the mid to lower lungs. No acute abnormalities. Electronically Signed   By: Lavonia Dana M.D.   On: 11/05/2021 14:48        Scheduled Meds:  [MAR Hold] insulin aspart  0-5 Units Subcutaneous QHS   [MAR Hold] insulin aspart  0-9 Units Subcutaneous TID WC   [MAR Hold]  methylPREDNISolone (SOLU-MEDROL) injection  80 mg Intravenous Q12H   [MAR Hold] pantoprazole  40 mg Intravenous Q12H   Continuous Infusions:  sodium chloride     lactated ringers Stopped (11/06/21 1213)   pantoprazole 8 mg/hr (11/05/21 2331)     LOS: 1 day    Time spent: 35 minutes    Chistian Kasler D Manuella Ghazi, DO [facility]  If 7PM-7AM, please contact night-coverage [email or web address] 11/06/2021, 12:30 PM

## 2021-11-06 NOTE — Evaluation (Addendum)
Physical Therapy Evaluation Patient Details Name: Donald Gaines MRN: [id number] DOB: Apr 21, 1940 Today's Date: 11/06/2021  History of Present Illness  Donald Gaines  is a 81 y.o. male,  with medical history significant for COPD, CAD, prediabetes, hypertension, atrial fibrillation not on anticoagulation due to chronic blood loss anemia from AVM, and tobacco use, down to 1 cigarette/day, chronic hypoxic respiratory failure on 3 L oxygen at baseline.  -Patient presents to ED secondary to complaints of chest pain and shortness of breath reports he has been having exertional dyspnea going on for last 2 to 3 weeks, at baseline he is able to walk to the mailbox with mild dyspnea, at baseline 2 to 3 L nasal cannula at home, reports symptoms has been worsening much recently, reports currently he is even dyspneic at rest, as well he does report some chest pain, mainly with activity, upon further questioning he does report melena and dark-colored stools, he denies nausea, vomiting, diarrhea or abdominal pain, no fever or chills   Clinical Impression  Patient demonstrates good return for sitting up at bedside, slightly labored movement having to lean on armrest of chair during transfers without AD, safer using RW with fair/good endurance/tolerance for ambulation in hallway without loss of balance other than occasional drifting right/left and tolerated sitting up in chair after therapy.  Patient will benefit from continued skilled physical therapy in hospital and recommended venue below to increase strength, balance, endurance for safe ADLs and gait. Patient ambulated in hallway on 2 LPM with SpO2 at 95-96% - RN notified.        Recommendations for follow up therapy are one component of a multi-disciplinary discharge planning process, led by the attending physician.  Recommendations may be updated based on patient status, additional functional criteria and insurance authorization.  Follow Up Recommendations  Home health PT      Assistance Recommended at Discharge Set up Supervision/Assistance  Patient can return home with the following  A little help with walking and/or transfers;A little help with bathing/dressing/bathroom;Help with stairs or ramp for entrance;Assistance with cooking/housework    Equipment Recommendations None recommended by PT  Recommendations for Other Services       Functional Status Assessment Patient has had a recent decline in their functional status and demonstrates the ability to make significant improvements in function in a reasonable and predictable amount of time.     Precautions / Restrictions Precautions Precautions: Fall Restrictions Weight Bearing Restrictions: No      Mobility  Bed Mobility Overal bed mobility: Modified Independent                  Transfers Overall transfer level: Needs assistance Equipment used: Rolling walker (2 wheels) Transfers: Sit to/from Stand, Bed to chair/wheelchair/BSC Sit to Stand: Supervision   Step pivot transfers: Supervision       General transfer comment: as per OT notes    Ambulation/Gait Ambulation/Gait assistance: Supervision, Min guard Gait Distance (Feet): 100 Feet Assistive device: Rolling walker (2 wheels) Gait Pattern/deviations: Decreased step length - right, Decreased step length - left, Decreased stride length, Drifts right/left Gait velocity: decreased     General Gait Details: slightly labored cadence with occasional drifting right/left without loss of balance with fair/good tolerance for ambulation in room and hallway  Stairs            Wheelchair Mobility    Modified Rankin (Stroke Patients Only)       Balance Overall balance assessment: Needs assistance Sitting-balance support: Feet supported, No  upper extremity supported Sitting balance-Leahy Scale: Good Sitting balance - Comments: seated EOB   Standing balance support: During functional activity, Bilateral  upper extremity supported Standing balance-Leahy Scale: Fair Standing balance comment: fair/good using RW                             Pertinent Vitals/Pain Pain Assessment Pain Assessment: No/denies pain    Home Living Family/patient expects to be discharged to:: Private residence Living Arrangements: Other relatives Available Help at Discharge: Family;Available PRN/intermittently Type of Home: Mobile home Home Access: Stairs to enter Entrance Stairs-Rails: Can reach both Entrance Stairs-Number of Steps: 4   Home Layout: One level Home Equipment: Conservation officer, nature (2 wheels);Cane - single point;BSC/3in1;Shower seat - built in;Grab bars - toilet Additional Comments: Pt reports his sister is available most of the time.    Prior Function Prior Level of Function : Needs assist;History of Falls (last six months)       Physical Assist : ADLs (physical)   ADLs (physical): IADLs Mobility Comments: household and short distanced community ambulator using RW and cane PRN ADLs Comments: Independent ADL and most IADL but sister assists with groceries.     Hand Dominance   Dominant Hand: Right    Extremity/Trunk Assessment   Upper Extremity Assessment Upper Extremity Assessment: Defer to OT evaluation    Lower Extremity Assessment Lower Extremity Assessment: Generalized weakness    Cervical / Trunk Assessment Cervical / Trunk Assessment: Normal  Communication   Communication: No difficulties  Cognition Arousal/Alertness: Awake/alert Behavior During Therapy: WFL for tasks assessed/performed Overall Cognitive Status: Within Functional Limits for tasks assessed                                          General Comments      Exercises     Assessment/Plan    PT Assessment Patient needs continued PT services  PT Problem List Decreased strength;Decreased activity tolerance;Decreased balance;Decreased mobility       PT Treatment Interventions  DME instruction;Gait training;Stair training;Functional mobility training;Therapeutic activities;Therapeutic exercise;Patient/family education;Balance training    PT Goals (Current goals can be found in the Care Plan section)  Acute Rehab PT Goals Patient Stated Goal: return home with family to assist PT Goal Formulation: With patient Time For Goal Achievement: 11/09/21 Potential to Achieve Goals: Good    Frequency Min 3X/week     Co-evaluation PT/OT/SLP Co-Evaluation/Treatment: Yes Reason for Co-Treatment: To address functional/ADL transfers PT goals addressed during session: Mobility/safety with mobility;Balance;Proper use of DME OT goals addressed during session: ADL's and self-care       AM-PAC PT "6 Clicks" Mobility  Outcome Measure Help needed turning from your back to your side while in a flat bed without using bedrails?: None Help needed moving from lying on your back to sitting on the side of a flat bed without using bedrails?: None Help needed moving to and from a bed to a chair (including a wheelchair)?: A Little Help needed standing up from a chair using your arms (e.g., wheelchair or bedside chair)?: A Little Help needed to walk in hospital room?: A Little Help needed climbing 3-5 steps with a railing? : A Little 6 Click Score: 20    End of Session   Activity Tolerance: Patient tolerated treatment well;Patient limited by fatigue Patient left: in chair;with call bell/phone within reach Nurse Communication:  Mobility status PT Visit Diagnosis: Unsteadiness on feet (R26.81);Other abnormalities of gait and mobility (R26.89);Muscle weakness (generalized) (M62.81)    Time: 3500-9381 PT Time Calculation (min) (ACUTE ONLY): 23 min   Charges:   PT Evaluation $PT Eval Moderate Complexity: 1 Mod PT Treatments $Therapeutic Activity: 23-37 mins        11:38 AM, 11/06/21 Lonell Grandchild, MPT Physical Therapist with [facility] 336 [phone]  office [phone] mobile phone

## 2021-11-06 NOTE — Progress Notes (Signed)
Patient received one unit of PRBC, second unit not on had and awaiting arrival. Provider notified no new orders at this time. Patient received a nebulizer treatment for wheezing. Patient slept through the night with the exceptions of when he had to pee and this nurse check on him and for vitals.

## 2021-11-06 NOTE — Consult Note (Signed)
Gastroenterology Consult   Referring Provider: No ref. provider found Primary Care Physician:  Celene Squibb, MD Primary Gastroenterologist:  previously Dr. Laural Golden  Patient ID: Donald Gaines; [id number]; 11-09-1940   Admit date: 11/05/2021  LOS: 1 day   Date of Consultation: 11/06/2021  Reason for Consultation:  GI bleed    History of Present Illness   NASHON ERBES is a 81 y.o. male with PMH significant for h/o IDA due to chronic GI blood loss in setting of gastric and small bowel AVMS, COPD, DM, HTN, TIA, Afib not anticoagulated due to recurrent GI bleeding, CAD presenting via EMS for SOB and chest pain.   Patient reports exertional dyspnea for past 2-3 weeks, worse from his baseline. Chronically on 2-3L nasal cannula at home. He has become SOB at rest. Complains of exertional chest pain. He reported melena in the ED. Denies N/V, diarrhea, abdominal pain, fever/chills.   In the ED: EKG showed Afib (h/o permanent Afib at baseline), Creatinine 2.7, BUN 31. Baseline Creatinine 1.85. Stool black heme +. Hgb down to 8, had been 11.6 last year. MCV 108.9. B12/folate normal.  Ferritin 12. CXR with chronic interstitial changes at mid to lower lungs. Troponin I, 18-->18. BNP 736.  Hgb up to 9.4 after 1 units of prbcs.  BUN 31, Creatinine 2.77 today.  Patient has history of IDA due to chronic GI bleeding in setting of AVMs. No longer on chronic anticoagulation for Afib but he is on Plavix. He takes PPI. Last required procedures in 2015. Had been reasonably stable. He currently lives with his sister. States his daughter recently passed away. He is divorced. States his PCP has been worried about his kidney function. Wants him to go back to nephrology. Patient states he is on oral iron chronically. His stools have been black off/on for possible 2-3 months. Patient does have issues with remember times. Alert and oriented to person and place. He states his mouth stays dry from the oxygen. Denies  dysphagia. Some heartburn. No n/v. No abdominal pain. BM every 2-3 days. No brbpr.   Prior work up:  Small bowel enteroscopy 09/2013: mild portal gastropathy. Five small AVMs in proximal jejunum without bleeding stigmata s/p APC.  VCE 11/2012: two small duodenal diverticula without stigmata of bleeding. Two small AVMs without bleeding stigmata.   EGD/colonoscopy 10/2012: portal gastropathy. Normal TI. Single small cecal AVM without bleeding. Five small cecal polyps removed. Small rectal polyp removed. External hemorrhoids. Cecal polyps tubular adenomas, next colonoscopy 2019.   Prior to Admission medications   Medication Sig Start Date End Date Taking? Authorizing Provider  acetaminophen (TYLENOL) 500 MG tablet Take 500 mg by mouth every 6 (six) hours as needed for headache.   Yes [provider]  albuterol (PROVENTIL) (2.5 MG/3ML) 0.083% nebulizer solution Take 3 mLs (2.5 mg total) by nebulization every 2 (two) hours as needed for wheezing or shortness of breath. 06/05/20  Yes Johnson, Clanford L, MD  ALPRAZolam (XANAX) 0.5 MG tablet Take 0.5 mg by mouth 2 (two) times daily as needed for anxiety.   Yes [provider]  carvedilol (COREG) 12.5 MG tablet Take 12.5 mg by mouth 2 (two) times daily with a meal.  03/26/14  Yes [provider]  cetirizine (ZYRTEC) 10 MG tablet Take 10 mg by mouth at bedtime.    Yes [provider]  Cholecalciferol (VITAMIN D3) 1.25 MG (50000 UT) CAPS Take 1 capsule by mouth once a week. 10/30/21  Yes [provider]  clopidogrel (PLAVIX) 75 MG tablet Take 1 tablet (75 mg total) by mouth every morning. 09/27/13  Yes Nita Sells, MD  escitalopram (LEXAPRO) 10 MG tablet Take 10 mg by mouth daily. 05/16/20  Yes [provider]  ferrous sulfate 325 (65 FE) MG tablet Take 325 mg by mouth daily with breakfast.   Yes [provider]  furosemide (LASIX) 40 MG tablet Take 1 tablet (40 mg total) by mouth 2 (two) times  daily. 06/05/20  Yes Johnson, Clanford L, MD  gabapentin (NEURONTIN) 100 MG capsule Take 100 mg by mouth daily. 10/30/21  Yes [provider]  metFORMIN (GLUCOPHAGE) 500 MG tablet Take 500 mg by mouth daily with breakfast.   Yes [provider]  NITROSTAT 0.4 MG SL tablet Place 0.4 mg under the tongue every 5 (five) minutes as needed for chest pain.  05/23/10  Yes [provider]  omeprazole (PRILOSEC) 20 MG capsule Take 20 mg by mouth daily.  08/28/13  Yes [provider]  rosuvastatin (CRESTOR) 20 MG tablet Take 1 tablet (20 mg total) by mouth daily. 12/12/13  Yes Satira Sark, MD  umeclidinium-vilanterol (ANORO ELLIPTA) 62.5-25 MCG/INH AEPB Inhale 1 puff into the lungs daily.   Yes [provider]        [provider]    Current Facility-Administered Medications  Medication Dose Route Frequency Provider Last Rate Last Admin   acetaminophen (TYLENOL) tablet 650 mg  650 mg Oral Q6H PRN Elgergawy, Silver Huguenin, MD       Or   acetaminophen (TYLENOL) suppository 650 mg  650 mg Rectal Q6H PRN Elgergawy, Silver Huguenin, MD       albuterol (PROVENTIL) (2.5 MG/3ML) 0.083% nebulizer solution 2.5 mg  2.5 mg Nebulization Q2H PRN Elgergawy, Silver Huguenin, MD   2.5 mg at 11/06/21 0010   insulin aspart (novoLOG) injection 0-5 Units  0-5 Units Subcutaneous QHS Elgergawy, Silver Huguenin, MD       insulin aspart (novoLOG) injection 0-9 Units  0-9 Units Subcutaneous TID WC Elgergawy, Silver Huguenin, MD       ipratropium-albuterol (DUONEB) 0.5-2.5 (3) MG/3ML nebulizer solution 3 mL  3 mL Nebulization Q6H Elgergawy, Silver Huguenin, MD   3 mL at 11/06/21 0255   methylPREDNISolone sodium succinate (SOLU-MEDROL) 125 mg/2 mL injection 80 mg  80 mg Intravenous Q12H Elgergawy, Silver Huguenin, MD   80 mg at 11/06/21 0736   [START ON 11/09/2021] pantoprazole (PROTONIX) injection 40 mg  40 mg Intravenous Q12H Elgergawy, Silver Huguenin, MD       pantoprozole (PROTONIX) 80 mg /NS 100 mL infusion  8 mg/hr  Intravenous Continuous Elgergawy, Silver Huguenin, MD 10 mL/hr at 11/05/21 2331 8 mg/hr at 11/05/21 2331    Allergies as of 11/05/2021 - Review Complete 11/05/2021  Allergen Reaction Noted   Penicillins Rash 05/25/2008    Past Medical History:  Diagnosis Date   Arteriovenous malformation small bowel    Capsule study 3/10   Bilateral carotid artery stenosis    BPH (benign prostatic hypertrophy)    Candida esophagitis (HCC)    Cataract    Chronic anemia    Chronic diarrhea    COPD (chronic obstructive pulmonary disease) ([place])    Coronary atherosclerosis of native coronary artery    Multivessel, BMS SVG TO RCA 2000, reportedly  "2" additional stents in interim   Dementia ([place])    Depression    Diabetes mellitus, type 2 ([place])    Essential hypertension    GERD (gastroesophageal reflux disease)  GI bleeding    Cecal ulcers, arteriovenous malformations   Glaucoma    Hemorrhoids    Hyperlipidemia    Myocardial infarction [facility])    1994   Portal hypertensive gastropathy ([place]) 11/08/2012   EGD. Dr. Laural Golden   Restrictive lung disease    Sleep apnea    TIA (transient ischemic attack)    2005    Past Surgical History:  Procedure Laterality Date   CAPSULE ENDO  03/10   cardiac catherization  04/2012   CARDIAC CATHETERIZATION  04/05/1996 [name]   EF 45%, occluded SVG to circumflex, patent SVG to D1 and PDA and patent LIMA to LAD with severe native vessel disease.   CARDIAC CATHETERIZATION  08/22/1998 [name]   Mild LM, severe LAD,severe CX, occlude RCA, patient  SVG to diatal RCA, patent vein graft to D1 and patent LIMA to LAD.    CATARACT EXTRACTION W/PHACO Right 09/22/2012   Procedure: CATARACT EXTRACTION PHACO AND INTRAOCULAR LENS PLACEMENT (IOC);  Surgeon: Tonny Branch, MD;  Location: [facility];  Service: Ophthalmology;  Laterality: Right;  CDE: 11.43   CATARACT EXTRACTION W/PHACO Left 10/17/2012   Procedure: CATARACT EXTRACTION PHACO AND INTRAOCULAR LENS PLACEMENT (IOC);  Surgeon:  Tonny Branch, MD;  Location: [facility];  Service: Ophthalmology;  Laterality: Left;  CDE: 8.06   COLONOSCOPY  APR 2008   SIMPLE ADENOMA, [place] TICS, IH   COLONOSCOPY  FEB 2008 ANEMIA. MELENA    2 LARGE ILEOCEAL ULCERS 2o to ASA, [place] TICS, IH   COLONOSCOPY  SEP 2009 TRANSFUSION DEP ANEMIA   AC AVM-ABLATED, [place] TICS, IH   COLONOSCOPY WITH ESOPHAGOGASTRODUODENOSCOPY (EGD) N/A 11/08/2012   Procedure: COLONOSCOPY WITH ESOPHAGOGASTRODUODENOSCOPY (EGD);  Surgeon: Rogene Houston, MD;  Location: [facility];  Service: Endoscopy;  Laterality: N/A;  250-rescheduled to 7:30 Ann to notify pt   CORONARY ANGIOPLASTY WITH STENT PLACEMENT  08/22/1998 [name]   TEC stenting of the SVG to RCA-Last seen by cardiologist in 2010.   CORONARY ARTERY BYPASS GRAFT  1995-triple bypass   LIMA-LAD, LIMA to intermediate branch, SVG to PD and second OM,   ESOPHAGOGASTRODUODENOSCOPY  SEP 09   DUODENAL LIPOMA   ESOPHAGOGASTRODUODENOSCOPY N/A 09/27/2013   Procedure: ESOPHAGOGASTRODUODENOSCOPY (EGD) Push enteroscopy;  Surgeon: Rogene Houston, MD;  Location: [facility];  Service: Endoscopy;  Laterality: N/A;   GIVENS CAPSULE STUDY N/A 11/24/2012   Procedure: GIVENS CAPSULE STUDY;  Surgeon: Rogene Houston, MD;  Location: [facility];  Service: Endoscopy;  Laterality: N/A;  [place]  09/27/2013   Procedure: HOT HEMOSTASIS (ARGON PLASMA COAGULATION/BICAP);  Surgeon: Rogene Houston, MD;  Location: [facility];  Service: Endoscopy;;   HYDROGEN BREATH TEST  2009   RIGHT INGUINAL HERNIA REPAIR     UPPER GASTROINTESTINAL ENDOSCOPY  SEP 2009   GASTRIC AVM ABLATED, NL DUO Bx    Family History  Problem Relation Age of Onset   Heart attack Mother    Heart disease Mother    Hypertension Mother     Social History   Socioeconomic History   Marital status: Divorced    Spouse name: Not on file   Number of children: Not on file   Years of education: Not on file   Highest education level: Not on file  Occupational  History   Not on file  Tobacco Use   Smoking status: Every Day    Packs/day: 1.00    Years: 60.00    Total pack years: 60.00  Types: Cigarettes    Start date: 01/02/1949   Smokeless tobacco: Never   Tobacco comments:    1 pack a day  Vaping Use   Vaping Use: Never used  Substance and Sexual Activity   Alcohol use: No    Alcohol/week: 0.0 standard drinks of alcohol    Comment: No etoh in 12 yrs.   Drug use: Yes    Types: Marijuana    Comment: last use 10/25/19   Sexual activity: Yes    Birth control/protection: None  Other Topics Concern   Not on file  Social History Narrative   Not on file   Social Determinants of Health   Financial Resource Strain: Not on file  Food Insecurity: Not on file  Transportation Needs: Not on file  Physical Activity: Not on file  Stress: Not on file  Social Connections: Not on file  Intimate Partner Violence: Not on file     Review of System:   General: Negative for anorexia, fever, chills, fatigue, weakness. Some unspecified weight loss. His weight is documented to be down <2 kg since 04/2021. Eyes: Negative for vision changes.  ENT: Negative for hoarseness, difficulty swallowing , nasal congestion. CV: Negative for chest pain today but had some exertional chest pain on admission. angina, palpitations, peripheral edema. See hpi  Respiratory: Negative for wheezing. See hpi.  GI: See history of present illness. GU:  Negative for dysuria, hematuria, urinary incontinence, urinary frequency, nocturnal urination.  MS: Negative for joint pain, low back pain.  Derm: Negative for rash or itching.  Neuro: Negative for weakness, abnormal sensation, seizure, frequent headaches,  confusion. See hpi  Psych: Negative for anxiety, depression, suicidal ideation, hallucinations.  Endo: Negative for unusual weight change.  Heme: Negative for bruising or bleeding. Allergy: Negative for rash or hives.      Physical Examination:   Vital signs in last 24  hours: Temp:  [97.3 F (36.3 C)-98.2 F (36.8 C)] 97.7 F (36.5 C) (08/17 0408) Pulse Rate:  [76-99] 97 (08/17 0408) Resp:  [17-24] 20 (08/17 0408) BP: (86-140)/(42-85) 98/66 (08/17 0408) SpO2:  [90 %-100 %] 98 % (08/17 0408) Weight:  [74.8 kg] 74.8 kg (08/16 2041) Last BM Date : 11/05/21  General: chronically ill appearing white male in NAD. Ambulated in the hallway just before my visit. No significant sob, well-developed in no acute distress.  Head: Normocephalic, atraumatic.   Eyes: Conjunctiva pale, no icterus. Mouth: Oropharyngeal mucosa dry and pink , no lesions erythema or exudate. Neck: Supple without thyromegaly, masses, or lymphadenopathy.  Lungs: diminished breath sounds in the bases. Scattered crackles.   Heart: Regular rate and irregular rhythm, no murmurs rubs or gallops.  Abdomen: Bowel sounds are normal, nontender, nondistended, no hepatosplenomegaly or masses, no abdominal bruits or hernia , no rebound or guarding.   Rectal: not performed Extremities: No lower extremity edema, clubbing, deformity.  Neuro: Alert and oriented to person, place. President reported to be Maudie Flakes, grossly normal neurologically.  Skin: Warm and dry, no rash or jaundice.   Psych: Alert and cooperative, normal mood and affect.        Intake/Output from previous day: 08/16 0701 - 08/17 0700 In: 436.4 [I.V.:44.4; Blood:392] Out: 225 [Urine:225] Intake/Output this shift: No intake/output data recorded.  Lab Results:   CBC Recent Labs    11/05/21 1411 11/06/21 0352  WBC 5.6 4.5  HGB 8.0* 9.4*  HCT 27.0* 31.5*  MCV 108.9* 107.9*  PLT 160 138*   BMET Recent Labs  11/05/21 1411 11/06/21 0352  NA 140 140  K 3.2* 4.5  CL 101 104  CO2 31 27  GLUCOSE 118* 162*  BUN 31* 31*  CREATININE 2.70* 2.77*  CALCIUM 8.7* 8.6*   LFT Recent Labs    11/05/21 1411  BILITOT 0.7  ALKPHOS 46  AST 16  ALT 9  PROT 6.2*  ALBUMIN 3.1*    Lipase No results for input(s): "LIPASE"  in the last 72 hours.  PT/INR No results for input(s): "LABPROT", "INR" in the last 72 hours.   Hepatitis Panel No results for input(s): "HEPBSAG", "HCVAB", "HEPAIGM", "HEPBIGM" in the last 72 hours.   Imaging Studies:   DG Chest 2 View  Result Date: 11/05/2021 CLINICAL DATA:  Dyspnea, chest pain, shortness of breath intermittently for past few days, 84% oxygenation on room air, crackles throughout EXAM: CHEST - 2 VIEW COMPARISON:  06/03/2020 FINDINGS: Enlargement of cardiac silhouette post median sternotomy. Coronary stent noted. Atherosclerotic calcifications aorta. Chronic interstitial prominence in the mid to lower lungs, stable. No acute infiltrate, pleural effusion, or pneumothorax. Bones demineralized. IMPRESSION: Enlargement of cardiac silhouette with chronic interstitial changes at the mid to lower lungs. No acute abnormalities. Electronically Signed   By: Lavonia Dana M.D.   On: 11/05/2021 14:48  [4 week]  Assessment:   81 y/o male with history of oxygen dependent COPD, permanent A-fib longer on anticoagulation because of recurrent GI bleeding, CAD, TIA, on Plavix, history of chronic GI blood loss/IDA in the setting of gastric and small bowel AVMs remotely presenting with shortness of breath and chest pain.  GI consulted for reported melena and heme positive stool.  GI bleed: Presenting hemoglobin of 8.0, down from 11.6 in March 2022.  Patient has reported decline in renal function, more recent hemoglobin currently unavailable.  MCV 108.9 with normal B12 and folate.  Ferritin low at 12.  Presenting with symptomatic anemia. Also with elevated BNP and chronic interstitial changes in mid to lower lungs likely contributing to his acute on chronic DOE/SOB. He is no longer anticoagulated due to remote issues with IDA in setting of chronic GI bleeding but has remained on Plavix.   Suspect acute on chronic anemia due to upper GI bleeding.  Documented portal gastropathy in 2015 with some F3/F4  on elastography at that time.  High risk for progression to cirrhosis.  No recent imaging.  Mild thrombocytopenia noted.  Bleeding could be secondary to esophageal varices (less likely given presentation), portal gastropathy, AVMs, malignancy.  Also with decline in hemoglobin likely multifactorial in the setting of progressive renal disease.   History of adenomatous colon polyps: Was due for surveillance colonoscopy in 2019.  If upper endoscopy unremarkable, may require colonoscopy.  Plan:   Plan for upper endoscopy today.  ASA 3/4.  I have discussed the risks, alternatives, benefits with regards to but not limited to the risk of reaction to medication, bleeding, infection, perforation and the patient is agreeable to proceed. Written consent to be obtained. Continue IV pantoprazole '40mg'$  BID. Consider abdominal u/s to assess for cirrhosis, await EGD findings.  If evidence of cirrhosis on EGD, would recommend antibiotics for SBP prophylaxis.    LOS: 1 day   We would like to thank you for the opportunity to participate in the care of THEODOR MUSTIN.  [name]. [name] [facility] [phone] 8/17/20237:39 AM

## 2021-11-06 NOTE — Evaluation (Signed)
Occupational Therapy Evaluation Patient Details Name: Donald Gaines MRN: [id number] DOB: Mar 02, 1941 Today's Date: 11/06/2021   History of Present Illness Donald Gaines  is a 81 y.o. male,  with medical history significant for COPD, CAD, prediabetes, hypertension, atrial fibrillation not on anticoagulation due to chronic blood loss anemia from AVM, and tobacco use, down to 1 cigarette/day, chronic hypoxic respiratory failure on 3 L oxygen at baseline.  -Patient presents to ED secondary to complaints of chest pain and shortness of breath reports he has been having exertional dyspnea going on for last 2 to 3 weeks, at baseline he is able to walk to the mailbox with mild dyspnea, at baseline 2 to 3 L nasal cannula at home, reports symptoms has been worsening much recently, reports currently he is even dyspneic at rest, as well he does report some chest pain, mainly with activity, upon further questioning he does report melena and dark-colored stools, he denies nausea, vomiting, diarrhea or abdominal pain, no fever or chills (Per MD)   Clinical Impression   Pt agreeable to PT and OT co-evaluation. Pt demonstrates good sitting balance with fair to good standing balance with use of RW which pt uses as needed at baseline. Pt reports 3 falls in the past 6 months and is mildly weak. Supplemental oxygen used throughout session at 2 L with pt SpO2 remaining above 95% throughout session. Pt demonstrates modified independence for seated ADL tasks and supervision in standing. Home health OT recommended primarily for safety assessment of home due to 3 falls in the past 6 months. Pt is not recommended for further acute OT services and will be discharged to care of nursing staff for remaining length of stay.       Recommendations for follow up therapy are one component of a multi-disciplinary discharge planning process, led by the attending physician.  Recommendations may be updated based on patient status,  additional functional criteria and insurance authorization.   Follow Up Recommendations  Home health OT    Assistance Recommended at Discharge PRN  Patient can return home with the following A little help with walking and/or transfers    Functional Status Assessment  Patient has had a recent decline in their functional status and demonstrates the ability to make significant improvements in function in a reasonable and predictable amount of time.  Equipment Recommendations  None recommended by OT    Recommendations for Other Services       Precautions / Restrictions Precautions Precautions: Fall      Mobility Bed Mobility Overal bed mobility: Modified Independent             General bed mobility comments: Mild labored movement    Transfers Overall transfer level: Needs assistance Equipment used: Rolling walker (2 wheels) Transfers: Sit to/from Stand, Bed to chair/wheelchair/BSC Sit to Stand: Supervision     Step pivot transfers: Supervision     General transfer comment: Mild labored movement with use of RW      Balance Overall balance assessment: Needs assistance Sitting-balance support: Feet supported, Bilateral upper extremity supported Sitting balance-Leahy Scale: Good Sitting balance - Comments: seated EOB   Standing balance support: Bilateral upper extremity supported, During functional activity Standing balance-Leahy Scale: Fair Standing balance comment: fair to good with RW                           ADL either performed or assessed with clinical judgement   ADL Overall ADL's : Needs  assistance/impaired     Grooming: Supervision/safety;Standing   Upper Body Bathing: Modified independent;Sitting   Lower Body Bathing: Modified independent;Sitting/lateral leans       Lower Body Dressing: Modified independent;Sitting/lateral leans Lower Body Dressing Details (indicate cue type and reason): Able to doff and don R sock with mild  labored movement. Toilet Transfer: Supervision/safety;Rolling walker (2 wheels);Ambulation Toilet Transfer Details (indicate cue type and reason): simulated via EOB to chair and to chair from ambulation in hall.         Functional mobility during ADLs: Supervision/safety;Rolling walker (2 wheels)       Vision Baseline Vision/History:  (History of cataracts removal and "lense" implants.) Ability to See in Adequate Light: 0 Adequate Patient Visual Report: No change from baseline Vision Assessment?: No apparent visual deficits                Pertinent Vitals/Pain Pain Assessment Pain Assessment: No/denies pain     Hand Dominance Right   Extremity/Trunk Assessment Upper Extremity Assessment Upper Extremity Assessment: Generalized weakness   Lower Extremity Assessment Lower Extremity Assessment: Defer to PT evaluation   Cervical / Trunk Assessment Cervical / Trunk Assessment: Normal   Communication Communication Communication: No difficulties   Cognition Arousal/Alertness: Awake/alert Behavior During Therapy: WFL for tasks assessed/performed Overall Cognitive Status: Within Functional Limits for tasks assessed                                                        Home Living Family/patient expects to be discharged to:: Private residence Living Arrangements: Other relatives Available Help at Discharge: Family;Available PRN/intermittently Type of Home: Mobile home Home Access: Stairs to enter Entrance Stairs-Number of Steps: 4 Entrance Stairs-Rails: Can reach both Home Layout: One level     Bathroom Shower/Tub: Occupational psychologist: Standard Bathroom Accessibility: Yes How Accessible: Accessible via walker Home Equipment: [place] (2 wheels);Cane - single point;BSC/3in1;Shower seat - built in;Grab bars - toilet   Additional Comments: Pt reports his sister is available most of the time.      Prior  Functioning/Environment Prior Level of Function : Needs assist;History of Falls (last six months) (3 falls in past 6 months)       Physical Assist : ADLs (physical)   ADLs (physical): IADLs Mobility Comments: [place] household ambulator with RW and cane PRN. ADLs Comments: Independent ADL and most IADL but sister assists with groceries.                                Co-evaluation PT/OT/SLP Co-Evaluation/Treatment: Yes Reason for Co-Treatment: To address functional/ADL transfers   OT goals addressed during session: ADL's and self-care                       End of Session Equipment Utilized During Treatment: Rolling walker (2 wheels)  Activity Tolerance: Patient tolerated treatment well Patient left: in chair;with call bell/phone within reach  OT Visit Diagnosis: Unsteadiness on feet (R26.81);Other abnormalities of gait and mobility (R26.89);Repeated falls (R29.6);History of falling (Z91.81);Muscle weakness (generalized) (M62.81)                Time: 1093-2355 OT Time Calculation (min): 24 min Charges:  OT General Charges $OT Visit: 1 Visit OT Evaluation $OT Eval  Low Complexity: 1 Low  Jenicka Coxe OT, MOT  Larey Seat 11/06/2021, 10:03 AM

## 2021-11-06 NOTE — Progress Notes (Addendum)
Alert and oriented x 3 , does not know month or day.  No bm's yet today, denies chest pain and says breathing feels a lot better.  To have EGD this morning and consent signed by him and called sister to get phone consent from her, as well, due to patient seems to have some confusion at times. Walked in hallway with PT and O2 turned back to 2 liters, home setting.  Since hgb this morning is up to 9.4 after one unit PRBC, Dr. Manuella Ghazi says ok to hold off on second unit for now.

## 2021-11-06 NOTE — Anesthesia Preprocedure Evaluation (Addendum)
Anesthesia Evaluation  Patient identified by MRN, date of birth, ID band Patient awake    Reviewed: Allergy & Precautions, NPO status , Patient's Chart, lab work & pertinent test results, reviewed documented beta blocker date and time   Airway Mallampati: II  TM Distance: >3 FB Neck ROM: Full    Dental  (+) Dental Advisory Given   Pulmonary sleep apnea, Continuous Positive Airway Pressure Ventilation and Oxygen sleep apnea , pneumonia, COPD,  COPD inhaler and oxygen dependent, Current Smoker,   Coarse breath sounds         Cardiovascular hypertension, Pt. on medications and Pt. on home beta blockers + CAD, + Past MI, + Cardiac Stents, + CABG and +CHF  Normal cardiovascular exam+ dysrhythmias Atrial Fibrillation  Rhythm:Regular Rate:Normal  05-Nov-2021 14:12:36 [facility] ROUTINE RECORD 1941-01-16 (5 yr) Male Caucasian Vent. rate 84 BPM PR interval ms QRS duration 114 ms QT/QTcB 414/490 ms P-R-T axes  34 224 Atrial fibrillation Incomplete left bundle branch block Borderline prolonged QT interval   Neuro/Psych PSYCHIATRIC DISORDERS Depression Dementia TIA   GI/Hepatic Neg liver ROS, GERD  Medicated and Controlled,  Endo/Other  diabetes, Well Controlled, Type 2, Oral Hypoglycemic Agents  Renal/GU Renal InsufficiencyRenal disease  negative genitourinary   Musculoskeletal negative musculoskeletal ROS (+)   Abdominal   Peds negative pediatric ROS (+)  Hematology  (+) Blood dyscrasia, anemia ,   Anesthesia Other Findings   Reproductive/Obstetrics negative OB ROS                            Anesthesia Physical Anesthesia Plan  ASA: 3  Anesthesia Plan: General   Post-op Pain Management: Minimal or no pain anticipated   Induction: Intravenous  PONV Risk Score and Plan: Propofol infusion  Airway Management Planned: Nasal Cannula and Natural Airway  Additional  Equipment:   Intra-op Plan:   Post-operative Plan:   Informed Consent: I have reviewed the patients History and Physical, chart, labs and discussed the procedure including the risks, benefits and alternatives for the proposed anesthesia with the patient or authorized representative who has indicated his/her understanding and acceptance.    Discussed DNR with patient.     Plan Discussed with: CRNA and Surgeon  Anesthesia Plan Comments: (In case of uncontrolled bleeding it is ok to intubate for brief period, brief resuscitation of does not want prolonged resuscitation for cardiac arrest. )       Anesthesia Quick Evaluation

## 2021-11-06 NOTE — Op Note (Addendum)
[facility] Patient Name: Donald Gaines Procedure Date: 11/06/2021 11:58 AM MRN: [id number] Date of Birth: [birth year] Attending MD: [name] DO CSN: [id number] Age: 81 Admit Type: Inpatient Procedure:                Upper GI endoscopy Indications:              Acute post hemorrhagic anemia Providers:                [name], DO, Charlsie Quest. Theda Sers RN, RN,                            Rosina Lowenstein, RN, Ladoris Gene Technician, Merchant [employer] officer Referring MD:              Medicines:                See the Anesthesia note for documentation of the                            administered medications Complications:            No immediate complications. Estimated Blood Loss:     Estimated blood loss: none. Procedure:                Pre-Anesthesia Assessment:                           - The anesthesia plan was to use monitored                            anesthesia care (MAC).                           After obtaining informed consent, the endoscope was                            passed under direct vision. Throughout the                            procedure, the patient's blood pressure, pulse, and                            oxygen saturations were monitored continuously. The                            [id number]-[id number] ([id number]) scope was introduced through the                            mouth, and advanced to the second part of duodenum.                            The upper GI endoscopy was accomplished without                            difficulty. The patient tolerated the procedure  well. Scope In: 12:10:52 PM Scope Out: 12:13:30 PM Total Procedure Duration: 0 hours 2 minutes 38 seconds  Findings:      There is no endoscopic evidence of bleeding, areas of erosion,       esophagitis, ulcerations or varices in the entire esophagus.      The Z-line was regular and was found 40 cm from the incisors.      The entire examined stomach was normal.      One  10 mm pedunculated polyp with no bleeding was found in the second       portion of the duodenum. Elected not to remove as patient is on PLAVIX Impression:               - Z-line regular, 40 cm from the incisors.                           - Normal stomach.                           - One duodenal polyp.                           - No specimens collected. Moderate Sedation:      Per Anesthesia Care Recommendation:           - Return patient to hospital ward for ongoing care.                            Will try and perform capsule endoscopy today to                            further evaluate. Keep patient NPO for now. Repeat                            EGD in outpatient setting to remove duodenal polyp                            off PLAVIX Procedure Code(s):        --- Professional ---                           [phone], Esophagogastroduodenoscopy, flexible,                            transoral; diagnostic, including collection of                            specimen(s) by brushing or washing, when performed                            (separate procedure) Diagnosis Code(s):        --- Professional ---                           D62, Acute posthemorrhagic anemia CPT copyright 2019 American Medical Association. All rights reserved. The codes documented in this report are preliminary and upon coder review may  be revised to meet current compliance requirements. [name], DO [name], DO 11/06/2021 12:18:13 PM This report has been signed electronically. Number of Addenda: 0

## 2021-11-06 NOTE — Progress Notes (Signed)
Patient took capsule belt off for an undetermined amount of time.  Possibly an hour or so,

## 2021-11-06 NOTE — H&P (View-Only) (Signed)
Gastroenterology Consult   Referring Provider: No ref. provider found Primary Care Physician:  Celene Squibb, MD Primary Gastroenterologist:  previously Dr. Laural Golden  Patient ID: Donald Gaines; [id number]; 1940/06/04   Admit date: 11/05/2021  LOS: 1 day   Date of Consultation: 11/06/2021  Reason for Consultation:  GI bleed    History of Present Illness   Donald Gaines is a 81 y.o. male with PMH significant for h/o IDA due to chronic GI blood loss in setting of gastric and small bowel AVMS, COPD, DM, HTN, TIA, Afib not anticoagulated due to recurrent GI bleeding, CAD presenting via EMS for SOB and chest pain.   Patient reports exertional dyspnea for past 2-3 weeks, worse from his baseline. Chronically on 2-3L nasal cannula at home. He has become SOB at rest. Complains of exertional chest pain. He reported melena in the ED. Denies N/V, diarrhea, abdominal pain, fever/chills.   In the ED: EKG showed Afib (h/o permanent Afib at baseline), Creatinine 2.7, BUN 31. Baseline Creatinine 1.85. Stool black heme +. Hgb down to 8, had been 11.6 last year. MCV 108.9. B12/folate normal.  Ferritin 12. CXR with chronic interstitial changes at mid to lower lungs. Troponin I, 18-->18. BNP 736.  Hgb up to 9.4 after 1 units of prbcs.  BUN 31, Creatinine 2.77 today.  Patient has history of IDA due to chronic GI bleeding in setting of AVMs. No longer on chronic anticoagulation for Afib but he is on Plavix. He takes PPI. Last required procedures in 2015. Had been reasonably stable. He currently lives with his sister. States his daughter recently passed away. He is divorced. States his PCP has been worried about his kidney function. Wants him to go back to nephrology. Patient states he is on oral iron chronically. His stools have been black off/on for possible 2-3 months. Patient does have issues with remember times. Alert and oriented to person and place. He states his mouth stays dry from the oxygen. Denies  dysphagia. Some heartburn. No n/v. No abdominal pain. BM every 2-3 days. No brbpr.   Prior work up:  Small bowel enteroscopy 09/2013: mild portal gastropathy. Five small AVMs in proximal jejunum without bleeding stigmata s/p APC.  VCE 11/2012: two small duodenal diverticula without stigmata of bleeding. Two small AVMs without bleeding stigmata.   EGD/colonoscopy 10/2012: portal gastropathy. Normal TI. Single small cecal AVM without bleeding. Five small cecal polyps removed. Small rectal polyp removed. External hemorrhoids. Cecal polyps tubular adenomas, next colonoscopy 2019.   Prior to Admission medications   Medication Sig Start Date End Date Taking? Authorizing Provider  acetaminophen (TYLENOL) 500 MG tablet Take 500 mg by mouth every 6 (six) hours as needed for headache.   Yes [provider]  albuterol (PROVENTIL) (2.5 MG/3ML) 0.083% nebulizer solution Take 3 mLs (2.5 mg total) by nebulization every 2 (two) hours as needed for wheezing or shortness of breath. 06/05/20  Yes Johnson, Clanford L, MD  ALPRAZolam (XANAX) 0.5 MG tablet Take 0.5 mg by mouth 2 (two) times daily as needed for anxiety.   Yes [provider]  carvedilol (COREG) 12.5 MG tablet Take 12.5 mg by mouth 2 (two) times daily with a meal.  03/26/14  Yes [provider]  cetirizine (ZYRTEC) 10 MG tablet Take 10 mg by mouth at bedtime.    Yes [provider]  Cholecalciferol (VITAMIN D3) 1.25 MG (50000 UT) CAPS Take 1 capsule by mouth once a week. 10/30/21  Yes [provider]  clopidogrel (PLAVIX) 75 MG tablet Take 1 tablet (75 mg total) by mouth every morning. 09/27/13  Yes Nita Sells, MD  escitalopram (LEXAPRO) 10 MG tablet Take 10 mg by mouth daily. 05/16/20  Yes [provider]  ferrous sulfate 325 (65 FE) MG tablet Take 325 mg by mouth daily with breakfast.   Yes [provider]  furosemide (LASIX) 40 MG tablet Take 1 tablet (40 mg total) by mouth 2 (two) times  daily. 06/05/20  Yes Johnson, Clanford L, MD  gabapentin (NEURONTIN) 100 MG capsule Take 100 mg by mouth daily. 10/30/21  Yes [provider]  metFORMIN (GLUCOPHAGE) 500 MG tablet Take 500 mg by mouth daily with breakfast.   Yes [provider]  NITROSTAT 0.4 MG SL tablet Place 0.4 mg under the tongue every 5 (five) minutes as needed for chest pain.  05/23/10  Yes [provider]  omeprazole (PRILOSEC) 20 MG capsule Take 20 mg by mouth daily.  08/28/13  Yes [provider]  rosuvastatin (CRESTOR) 20 MG tablet Take 1 tablet (20 mg total) by mouth daily. 12/12/13  Yes Satira Sark, MD  umeclidinium-vilanterol (ANORO ELLIPTA) 62.5-25 MCG/INH AEPB Inhale 1 puff into the lungs daily.   Yes [provider]        [provider]    Current Facility-Administered Medications  Medication Dose Route Frequency Provider Last Rate Last Admin   acetaminophen (TYLENOL) tablet 650 mg  650 mg Oral Q6H PRN Elgergawy, Silver Huguenin, MD       Or   acetaminophen (TYLENOL) suppository 650 mg  650 mg Rectal Q6H PRN Elgergawy, Silver Huguenin, MD       albuterol (PROVENTIL) (2.5 MG/3ML) 0.083% nebulizer solution 2.5 mg  2.5 mg Nebulization Q2H PRN Elgergawy, Silver Huguenin, MD   2.5 mg at 11/06/21 0010   insulin aspart (novoLOG) injection 0-5 Units  0-5 Units Subcutaneous QHS Elgergawy, Silver Huguenin, MD       insulin aspart (novoLOG) injection 0-9 Units  0-9 Units Subcutaneous TID WC Elgergawy, Silver Huguenin, MD       ipratropium-albuterol (DUONEB) 0.5-2.5 (3) MG/3ML nebulizer solution 3 mL  3 mL Nebulization Q6H Elgergawy, Silver Huguenin, MD   3 mL at 11/06/21 0255   methylPREDNISolone sodium succinate (SOLU-MEDROL) 125 mg/2 mL injection 80 mg  80 mg Intravenous Q12H Elgergawy, Silver Huguenin, MD   80 mg at 11/06/21 0736   [START ON 11/09/2021] pantoprazole (PROTONIX) injection 40 mg  40 mg Intravenous Q12H Elgergawy, Silver Huguenin, MD       pantoprozole (PROTONIX) 80 mg /NS 100 mL infusion  8 mg/hr  Intravenous Continuous Elgergawy, Silver Huguenin, MD 10 mL/hr at 11/05/21 2331 8 mg/hr at 11/05/21 2331    Allergies as of 11/05/2021 - Review Complete 11/05/2021  Allergen Reaction Noted   Penicillins Rash 05/25/2008    Past Medical History:  Diagnosis Date   Arteriovenous malformation small bowel    Capsule study 3/10   Bilateral carotid artery stenosis    BPH (benign prostatic hypertrophy)    Candida esophagitis (HCC)    Cataract    Chronic anemia    Chronic diarrhea    COPD (chronic obstructive pulmonary disease) ([place])    Coronary atherosclerosis of native coronary artery    Multivessel, BMS SVG TO RCA 2000, reportedly  "2" additional stents in interim   Dementia ([place])    Depression    Diabetes mellitus, type 2 ([place])    Essential hypertension    GERD (gastroesophageal reflux disease)  GI bleeding    Cecal ulcers, arteriovenous malformations   Glaucoma    Hemorrhoids    Hyperlipidemia    Myocardial infarction [facility])    1994   Portal hypertensive gastropathy ([place]) 11/08/2012   EGD. Dr. Laural Golden   Restrictive lung disease    Sleep apnea    TIA (transient ischemic attack)    2005    Past Surgical History:  Procedure Laterality Date   CAPSULE ENDO  03/10   cardiac catherization  04/2012   CARDIAC CATHETERIZATION  04/05/1996 [name]   EF 45%, occluded SVG to circumflex, patent SVG to D1 and PDA and patent LIMA to LAD with severe native vessel disease.   CARDIAC CATHETERIZATION  08/22/1998 [name]   Mild LM, severe LAD,severe CX, occlude RCA, patient  SVG to diatal RCA, patent vein graft to D1 and patent LIMA to LAD.    CATARACT EXTRACTION W/PHACO Right 09/22/2012   Procedure: CATARACT EXTRACTION PHACO AND INTRAOCULAR LENS PLACEMENT (IOC);  Surgeon: Tonny Branch, MD;  Location: [facility];  Service: Ophthalmology;  Laterality: Right;  CDE: 11.43   CATARACT EXTRACTION W/PHACO Left 10/17/2012   Procedure: CATARACT EXTRACTION PHACO AND INTRAOCULAR LENS PLACEMENT (IOC);  Surgeon:  Tonny Branch, MD;  Location: [facility];  Service: Ophthalmology;  Laterality: Left;  CDE: 8.06   COLONOSCOPY  APR 2008   SIMPLE ADENOMA, [place] TICS, IH   COLONOSCOPY  FEB 2008 ANEMIA. MELENA    2 LARGE ILEOCEAL ULCERS 2o to ASA, [place] TICS, IH   COLONOSCOPY  SEP 2009 TRANSFUSION DEP ANEMIA   AC AVM-ABLATED, [place] TICS, IH   COLONOSCOPY WITH ESOPHAGOGASTRODUODENOSCOPY (EGD) N/A 11/08/2012   Procedure: COLONOSCOPY WITH ESOPHAGOGASTRODUODENOSCOPY (EGD);  Surgeon: Rogene Houston, MD;  Location: [facility];  Service: Endoscopy;  Laterality: N/A;  250-rescheduled to 7:30 Ann to notify pt   CORONARY ANGIOPLASTY WITH STENT PLACEMENT  08/22/1998 [name]   TEC stenting of the SVG to RCA-Last seen by cardiologist in 2010.   CORONARY ARTERY BYPASS GRAFT  1995-triple bypass   LIMA-LAD, LIMA to intermediate branch, SVG to PD and second OM,   ESOPHAGOGASTRODUODENOSCOPY  SEP 09   DUODENAL LIPOMA   ESOPHAGOGASTRODUODENOSCOPY N/A 09/27/2013   Procedure: ESOPHAGOGASTRODUODENOSCOPY (EGD) Push enteroscopy;  Surgeon: Rogene Houston, MD;  Location: [facility];  Service: Endoscopy;  Laterality: N/A;   GIVENS CAPSULE STUDY N/A 11/24/2012   Procedure: GIVENS CAPSULE STUDY;  Surgeon: Rogene Houston, MD;  Location: [facility];  Service: Endoscopy;  Laterality: N/A;  [place]  09/27/2013   Procedure: HOT HEMOSTASIS (ARGON PLASMA COAGULATION/BICAP);  Surgeon: Rogene Houston, MD;  Location: [facility];  Service: Endoscopy;;   HYDROGEN BREATH TEST  2009   RIGHT INGUINAL HERNIA REPAIR     UPPER GASTROINTESTINAL ENDOSCOPY  SEP 2009   GASTRIC AVM ABLATED, NL DUO Bx    Family History  Problem Relation Age of Onset   Heart attack Mother    Heart disease Mother    Hypertension Mother     Social History   Socioeconomic History   Marital status: Divorced    Spouse name: Not on file   Number of children: Not on file   Years of education: Not on file   Highest education level: Not on file  Occupational  History   Not on file  Tobacco Use   Smoking status: Every Day    Packs/day: 1.00    Years: 60.00    Total pack years: 60.00  Types: Cigarettes    Start date: 01/02/1949   Smokeless tobacco: Never   Tobacco comments:    1 pack a day  Vaping Use   Vaping Use: Never used  Substance and Sexual Activity   Alcohol use: No    Alcohol/week: 0.0 standard drinks of alcohol    Comment: No etoh in 12 yrs.   Drug use: Yes    Types: Marijuana    Comment: last use 10/25/19   Sexual activity: Yes    Birth control/protection: None  Other Topics Concern   Not on file  Social History Narrative   Not on file   Social Determinants of Health   Financial Resource Strain: Not on file  Food Insecurity: Not on file  Transportation Needs: Not on file  Physical Activity: Not on file  Stress: Not on file  Social Connections: Not on file  Intimate Partner Violence: Not on file     Review of System:   General: Negative for anorexia, fever, chills, fatigue, weakness. Some unspecified weight loss. His weight is documented to be down <2 kg since 04/2021. Eyes: Negative for vision changes.  ENT: Negative for hoarseness, difficulty swallowing , nasal congestion. CV: Negative for chest pain today but had some exertional chest pain on admission. angina, palpitations, peripheral edema. See hpi  Respiratory: Negative for wheezing. See hpi.  GI: See history of present illness. GU:  Negative for dysuria, hematuria, urinary incontinence, urinary frequency, nocturnal urination.  MS: Negative for joint pain, low back pain.  Derm: Negative for rash or itching.  Neuro: Negative for weakness, abnormal sensation, seizure, frequent headaches,  confusion. See hpi  Psych: Negative for anxiety, depression, suicidal ideation, hallucinations.  Endo: Negative for unusual weight change.  Heme: Negative for bruising or bleeding. Allergy: Negative for rash or hives.      Physical Examination:   Vital signs in last 24  hours: Temp:  [97.3 F (36.3 C)-98.2 F (36.8 C)] 97.7 F (36.5 C) (08/17 0408) Pulse Rate:  [76-99] 97 (08/17 0408) Resp:  [17-24] 20 (08/17 0408) BP: (86-140)/(42-85) 98/66 (08/17 0408) SpO2:  [90 %-100 %] 98 % (08/17 0408) Weight:  [74.8 kg] 74.8 kg (08/16 2041) Last BM Date : 11/05/21  General: chronically ill appearing white male in NAD. Ambulated in the hallway just before my visit. No significant sob, well-developed in no acute distress.  Head: Normocephalic, atraumatic.   Eyes: Conjunctiva pale, no icterus. Mouth: Oropharyngeal mucosa dry and pink , no lesions erythema or exudate. Neck: Supple without thyromegaly, masses, or lymphadenopathy.  Lungs: diminished breath sounds in the bases. Scattered crackles.   Heart: Regular rate and irregular rhythm, no murmurs rubs or gallops.  Abdomen: Bowel sounds are normal, nontender, nondistended, no hepatosplenomegaly or masses, no abdominal bruits or hernia , no rebound or guarding.   Rectal: not performed Extremities: No lower extremity edema, clubbing, deformity.  Neuro: Alert and oriented to person, place. President reported to be Maudie Flakes, grossly normal neurologically.  Skin: Warm and dry, no rash or jaundice.   Psych: Alert and cooperative, normal mood and affect.        Intake/Output from previous day: 08/16 0701 - 08/17 0700 In: 436.4 [I.V.:44.4; Blood:392] Out: 225 [Urine:225] Intake/Output this shift: No intake/output data recorded.  Lab Results:   CBC Recent Labs    11/05/21 1411 11/06/21 0352  WBC 5.6 4.5  HGB 8.0* 9.4*  HCT 27.0* 31.5*  MCV 108.9* 107.9*  PLT 160 138*   BMET Recent Labs  11/05/21 1411 11/06/21 0352  NA 140 140  K 3.2* 4.5  CL 101 104  CO2 31 27  GLUCOSE 118* 162*  BUN 31* 31*  CREATININE 2.70* 2.77*  CALCIUM 8.7* 8.6*   LFT Recent Labs    11/05/21 1411  BILITOT 0.7  ALKPHOS 46  AST 16  ALT 9  PROT 6.2*  ALBUMIN 3.1*    Lipase No results for input(s): "LIPASE"  in the last 72 hours.  PT/INR No results for input(s): "LABPROT", "INR" in the last 72 hours.   Hepatitis Panel No results for input(s): "HEPBSAG", "HCVAB", "HEPAIGM", "HEPBIGM" in the last 72 hours.   Imaging Studies:   DG Chest 2 View  Result Date: 11/05/2021 CLINICAL DATA:  Dyspnea, chest pain, shortness of breath intermittently for past few days, 84% oxygenation on room air, crackles throughout EXAM: CHEST - 2 VIEW COMPARISON:  06/03/2020 FINDINGS: Enlargement of cardiac silhouette post median sternotomy. Coronary stent noted. Atherosclerotic calcifications aorta. Chronic interstitial prominence in the mid to lower lungs, stable. No acute infiltrate, pleural effusion, or pneumothorax. Bones demineralized. IMPRESSION: Enlargement of cardiac silhouette with chronic interstitial changes at the mid to lower lungs. No acute abnormalities. Electronically Signed   By: Lavonia Dana M.D.   On: 11/05/2021 14:48  [4 week]  Assessment:   81 y/o male with history of oxygen dependent COPD, permanent A-fib longer on anticoagulation because of recurrent GI bleeding, CAD, TIA, on Plavix, history of chronic GI blood loss/IDA in the setting of gastric and small bowel AVMs remotely presenting with shortness of breath and chest pain.  GI consulted for reported melena and heme positive stool.  GI bleed: Presenting hemoglobin of 8.0, down from 11.6 in March 2022.  Patient has reported decline in renal function, more recent hemoglobin currently unavailable.  MCV 108.9 with normal B12 and folate.  Ferritin low at 12.  Presenting with symptomatic anemia. Also with elevated BNP and chronic interstitial changes in mid to lower lungs likely contributing to his acute on chronic DOE/SOB. He is no longer anticoagulated due to remote issues with IDA in setting of chronic GI bleeding but has remained on Plavix.   Suspect acute on chronic anemia due to upper GI bleeding.  Documented portal gastropathy in 2015 with some F3/F4  on elastography at that time.  High risk for progression to cirrhosis.  No recent imaging.  Mild thrombocytopenia noted.  Bleeding could be secondary to esophageal varices (less likely given presentation), portal gastropathy, AVMs, malignancy.  Also with decline in hemoglobin likely multifactorial in the setting of progressive renal disease.   History of adenomatous colon polyps: Was due for surveillance colonoscopy in 2019.  If upper endoscopy unremarkable, may require colonoscopy.  Plan:   Plan for upper endoscopy today.  ASA 3/4.  I have discussed the risks, alternatives, benefits with regards to but not limited to the risk of reaction to medication, bleeding, infection, perforation and the patient is agreeable to proceed. Written consent to be obtained. Continue IV pantoprazole '40mg'$  BID. Consider abdominal u/s to assess for cirrhosis, await EGD findings.  If evidence of cirrhosis on EGD, would recommend antibiotics for SBP prophylaxis.    LOS: 1 day   We would like to thank you for the opportunity to participate in the care of NAZIAH WECKERLY.  [name]. [name] [facility] [phone] 8/17/20237:39 AM

## 2021-11-06 NOTE — Transfer of Care (Signed)
Immediate Anesthesia Transfer of Care Note  Patient: Donald Gaines  Procedure(s) Performed: ESOPHAGOGASTRODUODENOSCOPY (EGD) WITH PROPOFOL  Patient Location: PACU  Anesthesia Type:General  Level of Consciousness: awake, alert  and oriented  Airway & Oxygen Therapy: Patient Spontanous Breathing and Patient connected to face mask oxygen  Post-op Assessment: Report given to RN, Post -op Vital signs reviewed and stable, Patient moving all extremities X 4 and Patient able to stick tongue midline  Post vital signs: Reviewed  Last Vitals:  Vitals Value Taken Time  BP 100/65 11/06/21 1218  Temp 97.5   Pulse 92 11/06/21 1219  Resp 16 11/06/21 1219  SpO2 99 % 11/06/21 1219  Vitals shown include unvalidated device data.  Last Pain:  Vitals:   11/06/21 1208  TempSrc:   PainSc: 0-No pain      Patients Stated Pain Goal: 6 (92/42/68 3419)  Complications: No notable events documented.

## 2021-11-06 NOTE — Anesthesia Postprocedure Evaluation (Signed)
Anesthesia Post Note  Patient: Donald Gaines  Procedure(s) Performed: ESOPHAGOGASTRODUODENOSCOPY (EGD) WITH PROPOFOL  Patient location during evaluation: Phase II Anesthesia Type: General Level of consciousness: awake and alert and oriented Pain management: pain level controlled Vital Signs Assessment: post-procedure vital signs reviewed and stable Respiratory status: spontaneous breathing, nonlabored ventilation and respiratory function stable Cardiovascular status: blood pressure returned to baseline and stable Postop Assessment: no apparent nausea or vomiting Anesthetic complications: no   No notable events documented.   Last Vitals:  Vitals:   11/06/21 1230 11/06/21 1306  BP: 109/73 132/67  Pulse: 86 74  Resp: (!) 25 17  Temp:    SpO2: 94% 99%    Last Pain:  Vitals:   11/06/21 1220  TempSrc:   PainSc: 0-No pain                 Neyah Ellerman C Keymani Glynn

## 2021-11-06 NOTE — TOC Progression Note (Signed)
  Transition of Care [facility]) Screening Note   Patient Details  Name: Donald Gaines Date of Birth: 10-30-1940   Transition of Care [facility]) CM/SW Contact:    Boneta Lucks, RN Phone Number: 11/06/2021, 11:06 AM  GI consulted - TOC following for [facility] needs.  Transition of Care Department [facility]) has reviewed patient and no TOC needs have been identified at this time. We will continue to monitor patient advancement through interdisciplinary progression rounds. If new patient transition needs arise, please place a TOC consult.     Expected Discharge Plan: [place]    Expected Discharge Plan and Services Expected Discharge Plan: [place]

## 2021-11-07 ENCOUNTER — Encounter (HOSPITAL_COMMUNITY)
Admission: EM | Disposition: A | Discharge status: Home / Self Care | Payer: Self-pay | Source: Home / Self Care | Attending: Internal Medicine

## 2021-11-07 ENCOUNTER — Inpatient Hospital Stay (HOSPITAL_COMMUNITY): Payer: Medicare HMO

## 2021-11-07 ENCOUNTER — Inpatient Hospital Stay (HOSPITAL_COMMUNITY): Payer: Medicare HMO | Admitting: Anesthesiology

## 2021-11-07 DIAGNOSIS — K921 Melena: Secondary | ICD-10-CM

## 2021-11-07 DIAGNOSIS — K31819 Angiodysplasia of stomach and duodenum without bleeding: Secondary | ICD-10-CM

## 2021-11-07 DIAGNOSIS — K317 Polyp of stomach and duodenum: Secondary | ICD-10-CM

## 2021-11-07 DIAGNOSIS — D509 Iron deficiency anemia, unspecified: Secondary | ICD-10-CM

## 2021-11-07 DIAGNOSIS — I5030 Unspecified diastolic (congestive) heart failure: Secondary | ICD-10-CM | POA: Diagnosis not present

## 2021-11-07 DIAGNOSIS — D132 Benign neoplasm of duodenum: Secondary | ICD-10-CM

## 2021-11-07 DIAGNOSIS — J449 Chronic obstructive pulmonary disease, unspecified: Secondary | ICD-10-CM

## 2021-11-07 DIAGNOSIS — K5520 Angiodysplasia of colon without hemorrhage: Secondary | ICD-10-CM

## 2021-11-07 HISTORY — PX: HEMOSTASIS CLIP PLACEMENT: SHX6857

## 2021-11-07 HISTORY — PX: HOT HEMOSTASIS: SHX5433

## 2021-11-07 HISTORY — PX: ENTEROSCOPY: SHX5533

## 2021-11-07 LAB — CBC
HCT: 31.9 % — ABNORMAL LOW (ref 39.0–52.0)
Hemoglobin: 9.8 g/dL — ABNORMAL LOW (ref 13.0–17.0)
MCH: 32.1 pg (ref 26.0–34.0)
MCHC: 30.7 g/dL (ref 30.0–36.0)
MCV: 104.6 fL — ABNORMAL HIGH (ref 80.0–100.0)
Platelets: 159 10*3/uL (ref 150–400)
RBC: 3.05 MIL/uL — ABNORMAL LOW (ref 4.22–5.81)
RDW: 17.1 % — ABNORMAL HIGH (ref 11.5–15.5)
WBC: 10.1 10*3/uL (ref 4.0–10.5)
nRBC: 0 % (ref 0.0–0.2)

## 2021-11-07 LAB — GLUCOSE, CAPILLARY
Glucose-Capillary: 145 mg/dL — ABNORMAL HIGH (ref 70–99)
Glucose-Capillary: 127 mg/dL — ABNORMAL HIGH (ref 70–99)
Glucose-Capillary: 145 mg/dL — ABNORMAL HIGH (ref 70–99)
Glucose-Capillary: 149 mg/dL — ABNORMAL HIGH (ref 70–99)

## 2021-11-07 LAB — BASIC METABOLIC PANEL
Anion gap: 10 (ref 5–15)
BUN: 37 mg/dL — ABNORMAL HIGH (ref 8–23)
CO2: 27 mmol/L (ref 22–32)
Calcium: 8.9 mg/dL (ref 8.9–10.3)
Chloride: 104 mmol/L (ref 98–111)
Creatinine, Ser: 2.71 mg/dL — ABNORMAL HIGH (ref 0.61–1.24)
GFR, Estimated: 23 mL/min — ABNORMAL LOW (ref >60)
Glucose, Bld: 142 mg/dL — ABNORMAL HIGH (ref 70–99)
Potassium: 4 mmol/L (ref 3.5–5.1)
Sodium: 141 mmol/L (ref 135–145)

## 2021-11-07 LAB — MAGNESIUM: Magnesium: 2.3 mg/dL (ref 1.7–2.4)

## 2021-11-07 SURGERY — ENTEROSCOPY
Anesthesia: General

## 2021-11-07 MED ORDER — HALOPERIDOL LACTATE 5 MG/ML IJ SOLN
1.0000 mg | Freq: Four times a day (QID) | INTRAMUSCULAR | Status: DC | PRN
  Administered 2021-11-07: 1 mg via INTRAVENOUS
  Filled 2021-11-07 (×2): qty 1

## 2021-11-07 MED ORDER — LIDOCAINE VISCOUS HCL 2 % MT SOLN: 15.0000 mL | Freq: Once | OROMUCOSAL | Status: DC

## 2021-11-07 MED ORDER — METHYLPREDNISOLONE SODIUM SUCC 40 MG IJ SOLR
40.0000 mg | Freq: Two times a day (BID) | INTRAMUSCULAR | Status: DC
  Filled 2021-11-07: qty 1

## 2021-11-07 MED ORDER — GLUCAGON HCL RDNA (DIAGNOSTIC) 1 MG IJ SOLR
INTRAMUSCULAR | Status: AC
  Filled 2021-11-07: qty 2

## 2021-11-07 MED ORDER — GLUCAGON HCL RDNA (DIAGNOSTIC) 1 MG IJ SOLR
INTRAMUSCULAR | Status: DC | PRN
  Administered 2021-11-07: .5 mg via INTRAVENOUS

## 2021-11-07 MED ORDER — LACTATED RINGERS IV SOLN: INTRAVENOUS | Status: DC

## 2021-11-07 MED ORDER — PROPOFOL 10 MG/ML IV BOLUS
INTRAVENOUS | Status: DC | PRN
  Administered 2021-11-07: 20 mg via INTRAVENOUS
  Administered 2021-11-07: 40 mg via INTRAVENOUS
  Administered 2021-11-07: 30 mg via INTRAVENOUS
  Administered 2021-11-07: 20 mg via INTRAVENOUS
  Administered 2021-11-07: 50 mg via INTRAVENOUS
  Administered 2021-11-07 (×2): 20 mg via INTRAVENOUS

## 2021-11-07 MED ORDER — LACTATED RINGERS IV SOLN: INTRAVENOUS | Status: DC | PRN

## 2021-11-07 MED ORDER — SODIUM CHLORIDE 0.9 % IV SOLN: INTRAVENOUS | Status: DC

## 2021-11-07 MED ORDER — LIDOCAINE HCL (CARDIAC) PF 100 MG/5ML IV SOSY
PREFILLED_SYRINGE | INTRAVENOUS | Status: DC | PRN
  Administered 2021-11-07: 60 mg via INTRATRACHEAL

## 2021-11-07 NOTE — Brief Op Note (Addendum)
11/05/2021 - 11/07/2021  12:42 PM  PATIENT:  Donald Gaines  82 y.o. male  PRE-OPERATIVE DIAGNOSIS:  MELENA, ANEMIA, HISTORY OF AVMS  POST-OPERATIVE DIAGNOSIS:  duodenal AVM's x9; hemostasis clip  PROCEDURE:  Procedure(s) with comments: ENTEROSCOPY (N/A) - PUSH ENTEROSCOPY HOT HEMOSTASIS (ARGON PLASMA COAGULATION/BICAP) HEMOSTASIS CLIP PLACEMENT  SURGEON:  Surgeon(s) and Role:    * Harvel Quale, MD - Primary  Patient underwent push enteroscopy under propofol sedation.  Tolerated the procedure adequately.  Esophagus and stomach were normal. A single 10 mm pedunculated polyp with no bleeding was found in the second portion of the duodenum. This polyp was not removed as the patient took Plavix 2 days ago. Seven angiodysplastic lesions with no bleeding were found in the second portion of the duodenum, in the third portion of the duodenum and in the fourth portion of the duodenum.  Coagulation for bleeding prevention using argon plasma at 0.3 liters/minute and 20 watts was successful.  To prevent bleeding post-intervention, one hemostatic clip was successfully placed at the site of largest AVM after ablation was performed.  There was no bleeding at the end of the procedure. Two angiodysplastic lesions with no bleeding were found in the proximal jejunum.  Coagulation for bleeding prevention using argon plasma at 0.3 liters/minute and 20 watts was successful.   RECOMMENDATIONS - Return patient to hospital ward for ongoing care.  - Resume previous diet.  - Check H/H daily. - Continue pantoprazole 40 gm twice a day. - Discuss with cardiology possibility of stopping Plavix, Ok to be on low dose aspirin. - Repeat EGD in 6-8 weeks for removal of duodenal polyp - will need to be off Plavix at least 5 days.  Maylon Peppers, MD Gastroenterology and Hepatology [facility]

## 2021-11-07 NOTE — Anesthesia Preprocedure Evaluation (Signed)
Anesthesia Evaluation  Patient identified by MRN, date of birth, ID band Patient awake    Reviewed: Allergy & Precautions, NPO status , Patient's Chart, lab work & pertinent test results, reviewed documented beta blocker date and time   Airway Mallampati: II  TM Distance: >3 FB Neck ROM: Full    Dental  (+) Edentulous Upper, Edentulous Lower   Pulmonary sleep apnea, Continuous Positive Airway Pressure Ventilation and Oxygen sleep apnea , pneumonia, COPD,  COPD inhaler and oxygen dependent, Current Smoker and Patient abstained from smoking.,   Coarse breath sounds         Cardiovascular Exercise Tolerance: Poor hypertension, Pt. on medications and Pt. on home beta blockers + CAD, + Past MI, + Cardiac Stents, + CABG and +CHF  Normal cardiovascular exam+ dysrhythmias Atrial Fibrillation  Rhythm:Regular Rate:Normal  05-Nov-2021 14:12:36 [place] [facility]-ER ROUTINE RECORD 08-15-1940 (69 yr) Male Caucasian Vent. rate 84 BPM PR interval ms QRS duration 114 ms QT/QTcB 414/490 ms P-R-T axes  34 224 Atrial fibrillation Incomplete left bundle branch block Borderline prolonged QT interval   Neuro/Psych PSYCHIATRIC DISORDERS Depression Dementia TIA   GI/Hepatic Neg liver ROS, GERD  Medicated and Controlled,  Endo/Other  diabetes, Well Controlled, Type 2, Oral Hypoglycemic Agents  Renal/GU Renal InsufficiencyRenal disease  negative genitourinary   Musculoskeletal negative musculoskeletal ROS (+)   Abdominal   Peds negative pediatric ROS (+)  Hematology  (+) Blood dyscrasia, anemia ,   Anesthesia Other Findings   Reproductive/Obstetrics negative OB ROS                             Anesthesia Physical  Anesthesia Plan  ASA: 3  Anesthesia Plan: General   Post-op Pain Management: Minimal or no pain anticipated   Induction: Intravenous  PONV Risk Score and Plan: Propofol  infusion  Airway Management Planned: Nasal Cannula and Natural Airway  Additional Equipment:   Intra-op Plan:   Post-operative Plan:   Informed Consent: I have reviewed the patients History and Physical, chart, labs and discussed the procedure including the risks, benefits and alternatives for the proposed anesthesia with the patient or authorized representative who has indicated his/her understanding and acceptance.    Discussed DNR with patient.     Plan Discussed with: CRNA and Surgeon  Anesthesia Plan Comments: (In case of uncontrolled bleeding it is ok to intubate for brief period, brief resuscitation of does not want prolonged resuscitation for cardiac arrest. )        Anesthesia Quick Evaluation

## 2021-11-07 NOTE — Progress Notes (Signed)
Patient continuously getting up out of be this shift. Patient also has period of confusion and get him aggravated. Patient is able to be talked down and will return to bed but refuse to keep Heart monitor on. This nurse and tech continued to monitor patient.

## 2021-11-07 NOTE — Progress Notes (Signed)
0820 Patient refused PRN nebulizer (that RN requested.) Patient did not appear in respiratory distress,  he was reclined  with RR 19, SpO2 of 95% on 3 L/m [place] RLL expiratory wheeze and faint coarse BBS noted. Nebulizer suggested and patient refused at this time.

## 2021-11-07 NOTE — Progress Notes (Signed)
PROGRESS NOTE    Donald Gaines  KDT:[id number] DOB: 1940-10-07 DOA: 11/05/2021 PCP: Celene Squibb, MD   Brief Narrative:    Donald Gaines  is a 81 y.o. male,  with medical history significant for COPD, CAD, prediabetes, hypertension, atrial fibrillation not on anticoagulation due to chronic blood loss anemia from AVM, and tobacco use, down to 1 cigarette/day, chronic hypoxic respiratory failure on 3 L oxygen at baseline.  Patient presenting with complaints of chest pain or shortness of breath as well as exertional dyspnea and was admitted with symptomatic acute blood loss anemia secondary to GI bleed along with AKI on CKD stage III.  He underwent EGD on 8/17 with no significant findings and subsequently underwent capsule study with noted AVMs for which he underwent push enteroscopy on 8/18 with APC to multiple areas.  GI now recommending discontinuation of Plavix in case discussed with cardiology with plans to DC for now and follow-up outpatient.  Now with some steroid-induced psychosis from steroids used for COPD exacerbation.  Assessment & Plan:   Principal Problem:   CHF (congestive heart failure) (HCC) Active Problems:   Type 2 diabetes mellitus (HCC)   Iron deficiency anemia due to chronic blood loss   TOBACCO ABUSE   GERD   Congenital anomaly of the peripheral vascular system   Coronary atherosclerosis of native coronary artery   COPD (chronic obstructive pulmonary disease) (HCC)   ARF (acute renal failure) (HCC)   CKD (chronic kidney disease) stage 3, GFR 30-59 ml/min (HCC)   GI bleed  Assessment and Plan:   Acute symptomatic blood loss anemia GI bleed Chronic iron deficiency anemia -Presents with symptomatic anemia, dyspnea and chest pain, he had drop in his hemoglobin from 11.6 last year to 8 this admission -He is with known history of AVM in the past. -Status post 1 unit PRBC transfusion with improvement noted -PPI twice daily per GI -Hold Plavix, will use SCD for  DVT prophylaxis -GI has done endoscopy on patient 8/17 and he has undergone capsule study on the same day with noted AVMs.  Now status post push enteroscopy with APC to multiple lesions on 8/18. -Okay to resume soft diet 8/18 -Monitor CBC in a.m. and if stable could consider discharge especially if AKI improved.   AKI on CKD stage III -Baseline creatinine 1.8-1.9, it is 2.7 on admission -PRBC transfusion should help with volume resuscitation -Continue IV fluid -Check urine electrolytes and renal ultrasound -Hold nephrotoxic medications  Steroid-induced psychosis -Agitation noted with steroid use for COPD exacerbation -Haldol ordered as needed -Discontinued steroids   History of CAD with prior PCI and CABG -Chest pain most likely related to his anemia, we will continue to cycle troponins -Hold Plavix for now and on discharge with follow-up to Dr. Domenic Polite which will be arranged in 1-2 weeks.   Chronic hypoxic respiratory failure -At baseline, on 3 L nasal cannula -But he is dyspneic, due to above and COPD exacerbation which has now improved   COPD exacerbation  -Initial on IV steroids now discontinued due to psychosis/agitation -COPD exacerbation appears improved   Chronic diastolic CHF -Euvolemic, will hold on further diuresis   DM II  -Hold metformin, check A1c and keep an insulin sliding scale   Tobacco abuse -Counseled     GERD  -Continue with PPI   Essential hypertension-controlled -Patient is acceptable, continue with Coreg  -Hold lisinopril    hyperlipidemia -Continue with statin    Permanent atrial fibrillation with CHA2DS2-VASc score of 6.   -  Blood by cardiology, he is not on anticoagulation due to recurrent GI bleed from AVM per most recent cardiology note this February.   -Now recommended to remain off of Plavix as well     DVT prophylaxis: SCDs Code Status: DNR Family Communication: None at bedside Disposition Plan: Undergoing GI evaluation Status  is: Inpatient Remains inpatient appropriate because: Need for ongoing GI evaluation.  IV medications.     Consultants:  GI   Procedures:  EGD 8/17 Capsule study 8/17 Push enteroscopy with APC to multiple AVMs 8/18   Antimicrobials:  None    Subjective: Patient seen and evaluated today with some agitation noted overnight.  He is also quite hungry this morning and is being told to remain n.p.o. for his procedure.  No other complaints noted.  Objective: Vitals:   11/07/21 0613 11/07/21 0820 11/07/21 1157 11/07/21 1236  BP: (!) 143/101  (!) 138/91 (!) 96/51  Pulse: (!) 52 87 92   Resp: '20 20 18 17  '$ Temp: 98.2 F (36.8 C)  (!) 97.5 F (36.4 C) 98 F (36.7 C)  TempSrc: Oral  Oral   SpO2: 95% 95% 100% 91%  Weight:   74.8 kg   Height:   '5\' 5"'$  (1.651 m)     Intake/Output Summary (Last 24 hours) at 11/07/2021 1308 Last data filed at 11/07/2021 0400 Gross per 24 hour  Intake 107.5 ml  Output 150 ml  Net -42.5 ml   Filed Weights   11/05/21 2041 11/06/21 1148 11/07/21 1157  Weight: 74.8 kg 74.8 kg 74.8 kg    Examination:  General exam: Appears calm and comfortable  Respiratory system: Clear to auscultation. Respiratory effort normal. Cardiovascular system: S1 & S2 heard, RRR.  Gastrointestinal system: Abdomen is soft Central nervous system: Alert and awake Extremities: No edema Skin: No significant lesions noted Psychiatry: Flat affect.    Data Reviewed: I have personally reviewed following labs and imaging studies  CBC: Recent Labs  Lab 11/05/21 1411 11/06/21 0352 11/07/21 0427  WBC 5.6 4.5 10.1  NEUTROABS 4.4  --   --   HGB 8.0* 9.4* 9.8*  HCT 27.0* 31.5* 31.9*  MCV 108.9* 107.9* 104.6*  PLT 160 138* 616   Basic Metabolic Panel: Recent Labs  Lab 11/05/21 1411 11/06/21 0352 11/07/21 0427  NA 140 140 141  K 3.2* 4.5 4.0  CL 101 104 104  CO2 '31 27 27  '$ GLUCOSE 118* 162* 142*  BUN 31* 31* 37*  CREATININE 2.70* 2.77* 2.71*  CALCIUM 8.7* 8.6* 8.9   MG  --   --  2.3   GFR: Estimated Creatinine Clearance: 21.2 mL/min (A) (by C-G formula based on SCr of 2.71 mg/dL (H)). Liver Function Tests: Recent Labs  Lab 11/05/21 1411  AST 16  ALT 9  ALKPHOS 46  BILITOT 0.7  PROT 6.2*  ALBUMIN 3.1*   No results for input(s): "LIPASE", "AMYLASE" in the last 168 hours. No results for input(s): "AMMONIA" in the last 168 hours. Coagulation Profile: No results for input(s): "INR", "PROTIME" in the last 168 hours. Cardiac Enzymes: No results for input(s): "CKTOTAL", "CKMB", "CKMBINDEX", "TROPONINI" in the last 168 hours. BNP (last 3 results) No results for input(s): "PROBNP" in the last 8760 hours. HbA1C: Recent Labs    11/05/21 1400  HGBA1C 5.1   CBG: Recent Labs  Lab 11/06/21 1100 11/06/21 1617 11/06/21 2110 11/07/21 0700 11/07/21 1054  GLUCAP 130* 147* 143* 145* 127*   Lipid Profile: No results for input(s): "CHOL", "HDL", "LDLCALC", "TRIG", "  CHOLHDL", "LDLDIRECT" in the last 72 hours. Thyroid Function Tests: No results for input(s): "TSH", "T4TOTAL", "FREET4", "T3FREE", "THYROIDAB" in the last 72 hours. Anemia Panel: Recent Labs    11/05/21 1946  VITAMINB12 333  FOLATE 10.3  FERRITIN 12*  TIBC 431  IRON 65  RETICCTPCT 2.8   Sepsis Labs: No results for input(s): "PROCALCITON", "LATICACIDVEN" in the last 168 hours.  Recent Results (from the past 240 hour(s))  Resp Panel by RT-PCR (Flu A&B, Covid) Anterior Nasal Swab     Status: None   Collection Time: 11/05/21  2:25 PM   Specimen: Anterior Nasal Swab  Result Value Ref Range Status   SARS Coronavirus 2 by RT PCR NEGATIVE NEGATIVE Final    Comment: (NOTE) SARS-CoV-2 target nucleic acids are NOT DETECTED.  The SARS-CoV-2 RNA is generally detectable in upper respiratory specimens during the acute phase of infection. The lowest concentration of SARS-CoV-2 viral copies this assay can detect is 138 copies/mL. A negative result does not preclude SARS-Cov-2 infection  and should not be used as the sole basis for treatment or other patient management decisions. A negative result may occur with  improper specimen collection/handling, submission of specimen other than nasopharyngeal swab, presence of viral mutation(s) within the areas targeted by this assay, and inadequate number of viral copies(<138 copies/mL). A negative result must be combined with clinical observations, patient history, and epidemiological information. The expected result is Negative.  Fact Sheet for Patients:  EntrepreneurPulse.com.au  Fact Sheet for Healthcare Providers:  IncredibleEmployment.be  This test is no t yet approved or cleared by the [place] FDA and  has been authorized for detection and/or diagnosis of SARS-CoV-2 by FDA under an Emergency Use Authorization (EUA). This EUA will remain  in effect (meaning this test can be used) for the duration of the COVID-19 declaration under Section 564(b)(1) of the Act, 21 U.S.C.section 360bbb-3(b)(1), unless the authorization is terminated  or revoked sooner.       Influenza A by PCR NEGATIVE NEGATIVE Final   Influenza B by PCR NEGATIVE NEGATIVE Final    Comment: (NOTE) The Xpert Xpress SARS-CoV-2/FLU/RSV plus assay is intended as an aid in the diagnosis of influenza from Nasopharyngeal swab specimens and should not be used as a sole basis for treatment. Nasal washings and aspirates are unacceptable for Xpert Xpress SARS-CoV-2/FLU/RSV testing.  Fact Sheet for Patients: EntrepreneurPulse.com.au  Fact Sheet for Healthcare Providers: IncredibleEmployment.be  This test is not yet approved or cleared by the [place] FDA and has been authorized for detection and/or diagnosis of SARS-CoV-2 by FDA under an Emergency Use Authorization (EUA). This EUA will remain in effect (meaning this test can be used) for the duration of the COVID-19 declaration  under Section 564(b)(1) of the Act, 21 U.S.C. section 360bbb-3(b)(1), unless the authorization is terminated or revoked.  Performed at [facility], [address], [place], [place] [address]          Radiology Studies: DG Chest 2 View  Result Date: 11/05/2021 CLINICAL DATA:  Dyspnea, chest pain, shortness of breath intermittently for past few days, 84% oxygenation on room air, crackles throughout EXAM: CHEST - 2 VIEW COMPARISON:  06/03/2020 FINDINGS: Enlargement of cardiac silhouette post median sternotomy. Coronary stent noted. Atherosclerotic calcifications aorta. Chronic interstitial prominence in the mid to lower lungs, stable. No acute infiltrate, pleural effusion, or pneumothorax. Bones demineralized. IMPRESSION: Enlargement of cardiac silhouette with chronic interstitial changes at the mid to lower lungs. No acute abnormalities. Electronically Signed   By: Elta Guadel[place]  Thornton Papas M.D.   On: 11/05/2021 14:48        Scheduled Meds:  [MAR Hold] insulin aspart  0-5 Units Subcutaneous QHS   [MAR Hold] insulin aspart  0-9 Units Subcutaneous TID WC   lidocaine  15 mL Mouth/Throat Once   [MAR Hold] pantoprazole  40 mg Intravenous Q12H   Continuous Infusions:  sodium chloride     [MAR Hold] sodium chloride Stopped (11/06/21 1807)   sodium chloride     lactated ringers 75 mL/hr at 11/07/21 0802   lactated ringers     pantoprazole 8 mg/hr (11/07/21 0127)     LOS: 2 days    Time spent: 35 minutes    Bettye Sitton D Manuella Ghazi, DO [facility]  If 7PM-7AM, please contact night-coverage [email or web address] 11/07/2021, 1:08 PM

## 2021-11-07 NOTE — Transfer of Care (Signed)
Immediate Anesthesia Transfer of Care Note  Patient: Donald Gaines  Procedure(s) Performed: ENTEROSCOPY HOT HEMOSTASIS (ARGON PLASMA COAGULATION/BICAP) HEMOSTASIS CLIP PLACEMENT  Patient Location: PACU  Anesthesia Type:General  Level of Consciousness: sedated  Airway & Oxygen Therapy: Patient Spontanous Breathing and Patient connected to nasal cannula oxygen  Post-op Assessment: Report given to RN and Post -op Vital signs reviewed and stable  Post vital signs: Reviewed and stable  Last Vitals:  Vitals Value Taken Time  BP 118/65   Temp 36   Pulse 91   Resp 16   SpO2 97     Last Pain:  Vitals:   11/07/21 1202  TempSrc:   PainSc: 0-No pain      Patients Stated Pain Goal: 6 (33/82/50 5397)  Complications: No notable events documented.

## 2021-11-07 NOTE — Op Note (Signed)
[facility] Patient Name: Donald Gaines Procedure Date: 11/07/2021 11:53 AM MRN: [id number] Date of Birth: 01-Nov-1940 Attending MD: Maylon Peppers ,  CSN: [id number] Age: 81 Admit Type: Inpatient Procedure:                Small bowel enteroscopy Indications:              Iron deficiency anemia, Melena Providers:                Maylon Peppers, Crystal Page, Thomas Hoff., Technician Referring MD:              Medicines:                Monitored Anesthesia Care Complications:            No immediate complications. Estimated Blood Loss:     Estimated blood loss: none. Procedure:                Pre-Anesthesia Assessment:                           - Prior to the procedure, a History and Physical                            was performed, and patient medications, allergies                            and sensitivities were reviewed. The patient's                            tolerance of previous anesthesia was reviewed.                           - The risks and benefits of the procedure and the                            sedation options and risks were discussed with the                            patient. All questions were answered and informed                            consent was obtained.                           - ASA Grade Assessment: III - A patient with severe                            systemic disease.                           After obtaining informed consent, the endoscope was                            passed under direct vision. Throughout the  procedure, the patient's blood pressure, pulse, and                            oxygen saturations were monitored continuously. The                            [id number]) scope was introduced through                            the mouth and advanced to the proximal jejunum. The                            small bowel enteroscopy was accomplished without                             difficulty. The patient tolerated the procedure                            well. Scope In: 12:07:32 PM Scope Out: 12:32:12 PM Total Procedure Duration: 0 hours 24 minutes 40 seconds  Findings:      The esophagus was normal.      The stomach was normal.      A single 10 mm pedunculated polyp with no bleeding was found in the       second portion of the duodenum. This polyp was not removed as the       patient took Plavix 2 days ago.      Seven angiodysplastic lesions with no bleeding were found in the second       portion of the duodenum, in the third portion of the duodenum and in the       fourth portion of the duodenum. Coagulation for bleeding prevention       using argon plasma at 0.3 liters/minute and 20 watts was successful. To       prevent bleeding post-intervention, one hemostatic clip was successfully       placed at the site of largest AVM after ablation was performed. There       was no bleeding at the end of the procedure.      Two angiodysplastic lesions with no bleeding were found in the proximal       jejunum. Coagulation for bleeding prevention using argon plasma at 0.3       liters/minute and 20 watts was successful. Impression:               - Normal esophagus.                           - Normal stomach.                           - A single duodenal polyp.                           - Seven non-bleeding angiodysplastic lesions in the                            duodenum. Treated with argon plasma coagulation                            (  APC). Clip was placed.                           - Two non-bleeding angiodysplastic lesions in the                            duodenum. Treated with argon plasma coagulation                            (APC).                           - No specimens collected. Moderate Sedation:      Per Anesthesia Care Recommendation:           - Return patient to hospital ward for ongoing care.                           - Resume  previous diet.                           - Check H/H daily.                           - Continue pantoprazole 40 gm twice a day.                           - Discuss with cardiology possibility of stopping                            Plavix, Ok to be on low dose aspirin.                           - Repeat EGD in 6-8 weeks for removal of duodenal                            polyp - will need to be off Plavix at least 5 days. Procedure Code(s):        --- Professional ---                           [phone], Small intestinal endoscopy, enteroscopy                            beyond second portion of duodenum, not including                            ileum; with control of bleeding (eg, injection,                            bipolar cautery, unipolar cautery, laser, heater                            probe, stapler, plasma coagulator) Diagnosis Code(s):        --- Professional ---  K31.7, Polyp of stomach and duodenum                           K31.819, Angiodysplasia of stomach and duodenum                            without bleeding                           D50.9, Iron deficiency anemia, unspecified                           K92.1, Melena (includes Hematochezia) CPT copyright 2019 American Medical Association. All rights reserved. The codes documented in this report are preliminary and upon coder review may  be revised to meet current compliance requirements. Maylon Peppers, MD Maylon Peppers,  11/07/2021 12:47:48 PM This report has been signed electronically. Number of Addenda: 0

## 2021-11-07 NOTE — Anesthesia Postprocedure Evaluation (Signed)
Anesthesia Post Note  Patient: MERLE CIRELLI  Procedure(s) Performed: ENTEROSCOPY HOT HEMOSTASIS (ARGON PLASMA COAGULATION/BICAP) HEMOSTASIS CLIP PLACEMENT  Patient location during evaluation: Phase II Anesthesia Type: General Level of consciousness: awake and alert and oriented Pain management: pain level controlled Vital Signs Assessment: post-procedure vital signs reviewed and stable Respiratory status: spontaneous breathing, nonlabored ventilation and respiratory function stable Cardiovascular status: blood pressure returned to baseline and stable Postop Assessment: no apparent nausea or vomiting Anesthetic complications: no   No notable events documented.   Last Vitals:  Vitals:   11/07/21 1236 11/07/21 1322  BP: (!) 96/51 (!) 133/90  Pulse:  80  Resp: 17 16  Temp: 36.7 C (!) 36.4 C  SpO2: 91% 99%    Last Pain:  Vitals:   11/07/21 1322  TempSrc: Oral  PainSc:                  Mc Hollen C Benay Pomeroy

## 2021-11-07 NOTE — Progress Notes (Signed)
Cardiac monitoring D/C for pt. PT agitated this morning and is asking for something to eat. Pt educated about why he is NPO and that GI needs to see him before he is allowed food. Pt states " Well I haven't ate in days and I need food or I'm leaving". MD notified.

## 2021-11-07 NOTE — Progress Notes (Addendum)
Patient seen briefly this morning to evaluate respiratory status in preparation of possible push enteroscopy today for ongoing anemia and melena. Respiratory status appears to be at baseline and patient is in no acute distress. Discussed that givens capsule was without findings of obvious source of GI blood loss. Patient self reportedly very agitated that he has not been able to eat. Initially declining to undergo any further procedures as he wants to go home. After further discussion with the patient regarding recommendations for push enteroscopy as EGD and capsule study have been unrevealing for etiology of his anemia and melena and there are ongoing concerns for GI blood loss which could potentially be life threatening,  patient agreeable to undergo push enteroscopy today. Associate risks were also discussed with the patient. Orders have been placed and Dr. Jenetta Downer is aware.

## 2021-11-07 NOTE — Progress Notes (Signed)
We will proceed with push enteroscopy as scheduled.  I thoroughly discussed with the patient his procedure, including the risks involved. Patient understands what the procedure involves including the benefits and any risks. Patient understands alternatives to the proposed procedure. Risks including (but not limited to) bleeding, tearing of the lining (perforation), rupture of adjacent organs, problems with heart and lung function, infection, and medication reactions. A small percentage of complications may require surgery, hospitalization, repeat endoscopic procedure, and/or transfusion.  Patient understood and agreed.  Donald Persky Castaneda, MD Gastroenterology and Hepatology [facility]  

## 2021-11-07 NOTE — Progress Notes (Signed)
Pt agitated and keeps trying to get up without assistance  . Numerous staff members have tried to redirect pt with many failed attempts. Both pts Ivs was removed due to drainage. New IV placed and PRN medication was given to help with agitation. Bed alarm is on and call light is within reach.

## 2021-11-07 NOTE — Procedures (Signed)
Small Bowel Givens Capsule Study Procedure date: 11/06/2021  Referring Provider:  Heath Lark, MD PCP:  Dr. Nevada Crane, Edwinna Areola, MD  Indication for procedure:  iron deficiency anemia, melena  Findings:  Study was sub optimal as capsule did not reach the cecum; there was also a transient interruption in the imaging capture in the distal small bowel. The bowel preparation was adequate in the small bowel.  There was presence of 4 small non bleeding AVMs in the proximal small bowel.  No hematin or active bleeding was seen.     First Gastric image:  00:02:25 First Duodenal image: 02:05:51 First Cecal image: not reached Gastric Passage time: 2h 59m Summary & Recommendations: There was presence of 4 small non bleeding AVMs in the proximal small bowel.  No hematin or active bleeding was seen.  We will proceed with push enteroscopy today for ablation of these lesions.  It is unclear if he may have had more of these lesions in the distal ileum as the capsule did not reach the cecum.  He will need a repeat KUB in 1 week to determine if the capsule was expelled.  I personally communicated these recommendations to the patient  DMaylon Peppers MD Gastroenterology and Hepatology [facility]

## 2021-11-08 ENCOUNTER — Telehealth (INDEPENDENT_AMBULATORY_CARE_PROVIDER_SITE_OTHER): Payer: Self-pay | Admitting: Gastroenterology

## 2021-11-08 DIAGNOSIS — R69 Illness, unspecified: Secondary | ICD-10-CM | POA: Diagnosis not present

## 2021-11-08 DIAGNOSIS — N179 Acute kidney failure, unspecified: Secondary | ICD-10-CM | POA: Diagnosis not present

## 2021-11-08 DIAGNOSIS — F17200 Nicotine dependence, unspecified, uncomplicated: Secondary | ICD-10-CM

## 2021-11-08 DIAGNOSIS — D500 Iron deficiency anemia secondary to blood loss (chronic): Secondary | ICD-10-CM | POA: Diagnosis not present

## 2021-11-08 DIAGNOSIS — I5030 Unspecified diastolic (congestive) heart failure: Secondary | ICD-10-CM | POA: Diagnosis not present

## 2021-11-08 DIAGNOSIS — K922 Gastrointestinal hemorrhage, unspecified: Secondary | ICD-10-CM | POA: Diagnosis not present

## 2021-11-08 DIAGNOSIS — I10 Essential (primary) hypertension: Secondary | ICD-10-CM

## 2021-11-08 DIAGNOSIS — I2510 Atherosclerotic heart disease of native coronary artery without angina pectoris: Secondary | ICD-10-CM | POA: Diagnosis not present

## 2021-11-08 DIAGNOSIS — I4821 Permanent atrial fibrillation: Secondary | ICD-10-CM | POA: Diagnosis not present

## 2021-11-08 LAB — BASIC METABOLIC PANEL
Anion gap: 8 (ref 5–15)
BUN: 41 mg/dL — ABNORMAL HIGH (ref 8–23)
CO2: 28 mmol/L (ref 22–32)
Calcium: 8.7 mg/dL — ABNORMAL LOW (ref 8.9–10.3)
Chloride: 105 mmol/L (ref 98–111)
Creatinine, Ser: 2.78 mg/dL — ABNORMAL HIGH (ref 0.61–1.24)
GFR, Estimated: 23 mL/min — ABNORMAL LOW (ref >60)
Glucose, Bld: 126 mg/dL — ABNORMAL HIGH (ref 70–99)
Potassium: 3.6 mmol/L (ref 3.5–5.1)
Sodium: 141 mmol/L (ref 135–145)

## 2021-11-08 LAB — CBC
HCT: 30.8 % — ABNORMAL LOW (ref 39.0–52.0)
Hemoglobin: 9.2 g/dL — ABNORMAL LOW (ref 13.0–17.0)
MCH: 31.9 pg (ref 26.0–34.0)
MCHC: 29.9 g/dL — ABNORMAL LOW (ref 30.0–36.0)
MCV: 106.9 fL — ABNORMAL HIGH (ref 80.0–100.0)
Platelets: 138 10*3/uL — ABNORMAL LOW (ref 150–400)
RBC: 2.88 MIL/uL — ABNORMAL LOW (ref 4.22–5.81)
RDW: 16.4 % — ABNORMAL HIGH (ref 11.5–15.5)
WBC: 8.5 10*3/uL (ref 4.0–10.5)
nRBC: 0 % (ref 0.0–0.2)

## 2021-11-08 LAB — GLUCOSE, CAPILLARY
Glucose-Capillary: 115 mg/dL — ABNORMAL HIGH (ref 70–99)
Glucose-Capillary: 195 mg/dL — ABNORMAL HIGH (ref 70–99)

## 2021-11-08 LAB — MAGNESIUM: Magnesium: 2.2 mg/dL (ref 1.7–2.4)

## 2021-11-08 MED ORDER — FUROSEMIDE 40 MG PO TABS
40.0000 mg | ORAL_TABLET | Freq: Two times a day (BID) | ORAL | Status: DC
  Administered 2021-11-08: 40 mg via ORAL
  Filled 2021-11-08: qty 1

## 2021-11-08 NOTE — Assessment & Plan Note (Signed)
Continue blood pressure control with carvedilol.  Dyslipidemia, continue with statin therapy.

## 2021-11-08 NOTE — Assessment & Plan Note (Signed)
Patient was placed on insulin sliding scale for glucose cover and monitoring.  At the time of his discharge he will resume metformin.

## 2021-11-08 NOTE — Progress Notes (Signed)
PT recommends HH. Patient is not interested in [facility] services at this time.   Tamu Golz D, LCSW.

## 2021-11-08 NOTE — Assessment & Plan Note (Signed)
Continue with iron supplementation and follow up as outpatient.

## 2021-11-08 NOTE — Telephone Encounter (Signed)
Hi Mitzie,  Can you please schedule a follow up appointment for this patient in 3-4 weeks?  Thanks,  Maylon Peppers, MD Gastroenterology and Hepatology [facility]

## 2021-11-08 NOTE — Hospital Course (Addendum)
Donald Gaines was admitted to the hospital with the working diagnosis of acute blood loss anemia due to gastrointestinal bleed.  81 yo male with the past medical history of COPS, CAD, hypertension, AVM and atrial fibrillation who presented with chest pain and dyspnea. 2 to 3 weeks of worsening exertional dyspnea, to the point where he became symptomatic at rest. Intermittent chest pain. Positive melena and dark colored stools. On his initial physical examination his blood pressure was 125/81, HR 76, RR 21 and 02 saturation 91% on room air, lungs with no wheezing or rhonchi, heart with S1 and S2 present, irregularly irregular, abdomen soft and no lower extremity edema.   Na 140, K 3,2 Cl 101 bicarbonate 31, glucose 118 bun 31 cr 2,70 BNP 736  High sensitive troponin 18  Wbc 5,6 hgb 8,0 plt 160  SARS covid 19 negative.   Chest radiograph with mild cardiomegaly, mild hilar vascular congestion, and bilateral lower zones interstitial infiltrates, with small bilateral pleural effusions.   EKG 84 bpm, normal axis, normal qtc, atrial fibrillation rhythm, with no significant ST segment or T wave changes, positive PVC.  Positive drop in Hgb down to 8 and patient required one unit PRBC transfusion   08/17 upper endoscopy with 7 AVMs in the duodenum and jejunum which were ablated with APC, one of the lesisos was clipped x1,  Recommendations to discontinue clopidogrel.  08/17 capsule endoscopy 08/18 push small bowel enteroscopy

## 2021-11-08 NOTE — Assessment & Plan Note (Addendum)
Acute blood loss anemia due to upper GI bleeding.  Patient required one unit [facility] transfusion with good toleration.   Patient was placed on IV pantoprazole and GI was consulted.  Patient underwent upper endoscopy resulting in normal stomach.  Capsule endoscopy resulting in 4 small non bleeding AVMs in the proximal small bowel. No hematin or active bleeding. The study was sub optimal as capsule did not reach the cecum and there was transient interruption in the imaging capture in the distal small bowel. Small bowel enteroscopy with 7 non bleeding angiodysplastic lesions in the duodenum, treated with argon plasma coagulation and clip was placed.   No further signs of bleeding.  Patient's diet was advance with good toleration.  Plan to discontinue clopidogrel and continue with low dose aspirin.  Patient will need repeat cell count in 7 days, possible repeat EGD in 6 to 8 weeks for removal of his duodenal polyp.  KUB in 7 days, to follow up on capsule endoscopy.  Continue with proton pump inhibitors.

## 2021-11-08 NOTE — Discharge Summary (Signed)
Physician Discharge Summary   Patient: Donald Gaines MRN: [id number] DOB: 1940-09-29  Admit date:     11/05/2021  Discharge date: 11/08/21  Discharge Physician: Donald Gaines Donald Gaines   PCP: Donald Squibb, MD   Recommendations at discharge:    Follow up cell count and renal function in 7 days. Follow up KUB in 7 days, as capsule endoscopy did not reach the cecum. Continue proton pump inhibitors. Continue low dose aspirin, but discontinue clopidogrel.  Follow up with GI clinica in 6 to 8 weeks for removal of duodenal polyp.   I spoke over the phone with the patient's sister about patient's  condition, plan of care, prognosis and all questions were addressed.   Discharge Diagnoses: Principal Problem:   GI bleed Active Problems:   ARF (acute renal failure) (HCC)   CHF (congestive heart failure) (HCC)   COPD (chronic obstructive pulmonary disease) (HCC)   Coronary atherosclerosis of native coronary artery   Iron deficiency anemia due to chronic blood loss   TOBACCO ABUSE   Type 2 diabetes mellitus ([place])  Resolved Problems:   * No resolved hospital problems. [facility] Course: Donald Gaines was admitted to the hospital with the working diagnosis of acute blood loss anemia due to gastrointestinal bleed.  81 yo male with the past medical history of COPS, CAD, hypertension, AVM and atrial fibrillation who presented with chest pain and dyspnea. 2 to 3 weeks of worsening exertional dyspnea, to the point where he became symptomatic at rest. Intermittent chest pain. Positive melena and dark colored stools. On his initial physical examination his blood pressure was 125/81, HR 76, RR 21 and 02 saturation 91% on room air, lungs with no wheezing or rhonchi, heart with S1 and S2 present, irregularly irregular, abdomen soft and no lower extremity edema.   Na 140, K 3,2 Cl 101 bicarbonate 31, glucose 118 bun 31 cr 2,70 BNP 736  High sensitive troponin 18  Wbc 5,6 hgb 8,0 plt 160  SARS covid  19 negative.   Chest radiograph with mild cardiomegaly, mild hilar vascular congestion, and bilateral lower zones interstitial infiltrates, with small bilateral pleural effusions.   EKG 84 bpm, normal axis, normal qtc, atrial fibrillation rhythm, with no significant ST segment or T wave changes, positive PVC.  Positive drop in Hgb down to 8 and patient required one unit PRBC transfusion   08/17 upper endoscopy with 7 AVMs in the duodenum and jejunum which were ablated with APC, one of the lesisos was clipped x1,  Recommendations to discontinue clopidogrel.  08/17 capsule endoscopy 08/18 push small bowel enteroscopy     Assessment and Plan: * GI bleed Acute blood loss anemia due to upper GI bleeding.  Patient required one unit [facility] transfusion with good toleration.   Patient was placed on IV pantoprazole and GI was consulted.  Patient underwent upper endoscopy resulting in normal stomach.  Capsule endoscopy resulting in 4 small non bleeding AVMs in the proximal small bowel. No hematin or active bleeding. The study was sub optimal as capsule did not reach the cecum and there was transient interruption in the imaging capture in the distal small bowel. Small bowel enteroscopy with 7 non bleeding angiodysplastic lesions in the duodenum, treated with argon plasma coagulation and clip was placed.   No further signs of bleeding.  Patient's diet was advance with good toleration.  Plan to discontinue clopidogrel and continue with low dose aspirin.  Patient will need repeat cell count in 7 days, possible repeat  EGD in 6 to 8 weeks for removal of his duodenal polyp.  KUB in 7 days, to follow up on capsule endoscopy.  Continue with proton pump inhibitors.   ARF (acute renal failure) (HCC) CKD stage 3b.   Renal function remained with serum cr at 2,7 with K at 3,6 and serum bicarbonate at 28. He did received IV fluids during his hospitalization. At the time of his discharge he will resume  diuretic therapy 40 mg po bid.  Plan to follow up renal function as outpatient in 7 days.  CHF (congestive heart failure) (HCC) Echocardiogram with preserved LV systolic function with EF 60 to 65% with moderate LVH, RV systolic function preserved. No significant valvular disease.   At the time of his discharge patient will resume diuresis with furosemide.  Continue with carvedilol.    COPD (chronic obstructive pulmonary disease) ([place]) Patient had acute exacerbation, that was treated with short course of systemic corticosteroids and bronchodilatory therapy.  At the time of his discharge his symptoms have improved.   Chronic hypoxemic respiratory failure, continue with supplemental 02 per [place].   Patient developed steroid induced psychosis, that has improved by the time of his discharge.  Required as needed haloperidol.   Coronary atherosclerosis of native coronary artery Continue with statin therapy and aspirin, no clopidogrel due to recurrent gi bleeding.   Iron deficiency anemia due to chronic blood loss Continue with iron supplementation and follow up as outpatient.   TOBACCO ABUSE Smoking cessation counseling.   Type 2 diabetes mellitus ([place]) Patient was placed on insulin sliding scale for glucose cover and monitoring.  At the time of his discharge he will resume metformin.   Essential hypertension Continue blood pressure control with carvedilol.  Dyslipidemia, continue with statin therapy.   Permanent atrial fibrillation (HCC) Rate control with carvedilol. No anticoagulation due to recurrent GI bleeding. Ok to resume low dose aspirin.          Consultants: GI  Procedures performed: EGD, Enteroscopy and capsule endoscopy   Disposition: Home Diet recommendation:  Discharge Diet Orders (From admission, onward)     Start     Ordered   11/08/21 0000  Diet - low sodium heart healthy        11/08/21 1125           Cardiac and Carb modified diet DISCHARGE  MEDICATION: Allergies as of 11/08/2021       Reactions   Penicillins Rash   Has patient had a PCN reaction causing immediate rash, facial/tongue/throat swelling, SOB or lightheadedness with hypotension: Yes Has patient had a PCN reaction causing severe rash involving mucus membranes or skin necrosis: No Has patient had a PCN reaction that required hospitalization No Has patient had a PCN reaction occurring within the last 10 years: No If all of the above answers are "NO", then may proceed with Cephalosporin use.        Medication List     STOP taking these medications    clopidogrel 75 MG tablet Commonly known as: PLAVIX   finasteride 5 MG tablet Commonly known as: PROSCAR       TAKE these medications    acetaminophen 500 MG tablet Commonly known as: TYLENOL Take 500 mg by mouth every 6 (six) hours as needed for headache.   albuterol (2.5 MG/3ML) 0.083% nebulizer solution Commonly known as: PROVENTIL Take 3 mLs (2.5 mg total) by nebulization every 2 (two) hours as needed for wheezing or shortness of breath.   ALPRAZolam 0.5 MG  tablet Commonly known as: XANAX Take 0.5 mg by mouth 2 (two) times daily as needed for anxiety.   Anoro Ellipta 62.5-25 MCG/ACT Aepb Generic drug: umeclidinium-vilanterol Inhale 1 puff into the lungs daily.   carvedilol 12.5 MG tablet Commonly known as: COREG Take 12.5 mg by mouth 2 (two) times daily with a meal.   cetirizine 10 MG tablet Commonly known as: ZYRTEC Take 10 mg by mouth at bedtime.   escitalopram 10 MG tablet Commonly known as: LEXAPRO Take 10 mg by mouth daily.   ferrous sulfate 325 (65 FE) MG tablet Take 325 mg by mouth daily with breakfast.   furosemide 40 MG tablet Commonly known as: LASIX Take 1 tablet (40 mg total) by mouth 2 (two) times daily.   gabapentin 100 MG capsule Commonly known as: NEURONTIN Take 100 mg by mouth daily.   metFORMIN 500 MG tablet Commonly known as: GLUCOPHAGE Take 500 mg by mouth  daily with breakfast.   Nitrostat 0.4 MG SL tablet Generic drug: nitroGLYCERIN Place 0.4 mg under the tongue every 5 (five) minutes as needed for chest pain.   omeprazole 20 MG capsule Commonly known as: PRILOSEC Take 20 mg by mouth daily.   rosuvastatin 20 MG tablet Commonly known as: CRESTOR Take 1 tablet (20 mg total) by mouth daily.   Vitamin D3 1.25 MG (50000 UT) Caps Take 1 capsule by mouth once a week.        Follow-up Information     Satira Sark, MD Follow up on 11/20/2021.   Specialty: Cardiology Why: Cardiology Hospital Follow-up on 11/20/2021 at 1:00 PM. Contact information: [address] [address] [phone]                Discharge Exam: [place] Weights   11/05/21 2041 11/06/21 1148 11/07/21 1157  Weight: 74.8 kg 74.8 kg 74.8 kg   BP 121/76 (BP Location: Left Arm)   Pulse 98   Temp 98.2 F (36.8 C)   Resp 20   Ht '5\' 5"'$  (1.651 m)   Wt 74.8 kg   SpO2 97%   BMI 27.44 kg/m   Patient is feeling better, no abdominal pain , no nausea or vomiting  Neurology awake and alert ENT with mild pallor Cardiovascular with S1 and S2 present, irregularly irregular with no gallops, or rubs Respiratory with prolonged expiratory phase and scattered rales with no wheezing Abdomen with no distention  Trace lower extremity edema   Condition at discharge: stable  The results of significant diagnostics from this hospitalization (including imaging, microbiology, ancillary and laboratory) are listed below for reference.   Imaging Studies: US RENAL  Result Date: 11/07/2021 CLINICAL DATA:  Renal dysfunction EXAM: RENAL / URINARY TRACT ULTRASOUND COMPLETE COMPARISON:  04/30/2017 FINDINGS: Right Kidney: Renal measurements: 9.9 x 4.4 x 4.9 cm = volume: 111.1 mL. There is no hydronephrosis. There is increased cortical echogenicity. There is cortical thinning. There is a 2 cm cyst in the lower pole. Left Kidney: Renal measurements: 9.4 x 6.2 x 4.5 cm  = volume: 135.6 mL. There is no hydronephrosis. There is increased cortical echogenicity. There is cortical thinning. There are few cortical cysts measuring up to 1.7 cm in diameter. Bladder: Appears normal for degree of bladder distention. Other: Small ascites is noted in upper abdomen. IMPRESSION: There is no hydronephrosis. Increased cortical echogenicity in both kidneys suggests medical renal disease. Bilateral renal cysts. Small ascites. Electronically Signed   By: Elmer Picker M.D.   On: 11/07/2021 14:01  DG Chest 2 View  Result Date: 11/05/2021 CLINICAL DATA:  Dyspnea, chest pain, shortness of breath intermittently for past few days, 84% oxygenation on room air, crackles throughout EXAM: CHEST - 2 VIEW COMPARISON:  06/03/2020 FINDINGS: Enlargement of cardiac silhouette post median sternotomy. Coronary stent noted. Atherosclerotic calcifications aorta. Chronic interstitial prominence in the mid to lower lungs, stable. No acute infiltrate, pleural effusion, or pneumothorax. Bones demineralized. IMPRESSION: Enlargement of cardiac silhouette with chronic interstitial changes at the mid to lower lungs. No acute abnormalities. Electronically Signed   By: Lavonia Dana M.D.   On: 11/05/2021 14:48    Microbiology: Results for orders placed or performed during the hospital encounter of 11/05/21  Resp Panel by RT-PCR (Flu A&B, Covid) Anterior Nasal Swab     Status: None   Collection Time: 11/05/21  2:25 PM   Specimen: Anterior Nasal Swab  Result Value Ref Range Status   SARS Coronavirus 2 by RT PCR NEGATIVE NEGATIVE Final    Comment: (NOTE) SARS-CoV-2 target nucleic acids are NOT DETECTED.  The SARS-CoV-2 RNA is generally detectable in upper respiratory specimens during the acute phase of infection. The lowest concentration of SARS-CoV-2 viral copies this assay can detect is 138 copies/mL. A negative result does not preclude SARS-Cov-2 infection and should not be used as the sole basis for  treatment or other patient management decisions. A negative result may occur with  improper specimen collection/handling, submission of specimen other than nasopharyngeal swab, presence of viral mutation(s) within the areas targeted by this assay, and inadequate number of viral copies(<138 copies/mL). A negative result must be combined with clinical observations, patient history, and epidemiological information. The expected result is Negative.  Fact Sheet for Patients:  EntrepreneurPulse.com.au  Fact Sheet for Healthcare Providers:  IncredibleEmployment.be  This test is no t yet approved or cleared by the [place] FDA and  has been authorized for detection and/or diagnosis of SARS-CoV-2 by FDA under an Emergency Use Authorization (EUA). This EUA will remain  in effect (meaning this test can be used) for the duration of the COVID-19 declaration under Section 564(b)(1) of the Act, 21 U.S.C.section 360bbb-3(b)(1), unless the authorization is terminated  or revoked sooner.       Influenza A by PCR NEGATIVE NEGATIVE Final   Influenza B by PCR NEGATIVE NEGATIVE Final    Comment: (NOTE) The Xpert Xpress SARS-CoV-2/FLU/RSV plus assay is intended as an aid in the diagnosis of influenza from Nasopharyngeal swab specimens and should not be used as a sole basis for treatment. Nasal washings and aspirates are unacceptable for Xpert Xpress SARS-CoV-2/FLU/RSV testing.  Fact Sheet for Patients: EntrepreneurPulse.com.au  Fact Sheet for Healthcare Providers: IncredibleEmployment.be  This test is not yet approved or cleared by the [place] FDA and has been authorized for detection and/or diagnosis of SARS-CoV-2 by FDA under an Emergency Use Authorization (EUA). This EUA will remain in effect (meaning this test can be used) for the duration of the COVID-19 declaration under Section 564(b)(1) of the Act, 21  U.S.C. section 360bbb-3(b)(1), unless the authorization is terminated or revoked.  Performed at [facility], [address], [place], [place] [address]     Labs: CBC: Recent Labs  Lab 11/05/21 1411 11/06/21 0352 11/07/21 0427 11/08/21 0552  WBC 5.6 4.5 10.1 8.5  NEUTROABS 4.4  --   --   --   HGB 8.0* 9.4* 9.8* 9.2*  HCT 27.0* 31.5* 31.9* 30.8*  MCV 108.9* 107.9* 104.6* 106.9*  PLT 160 138* 159 138*  Basic Metabolic Panel: Recent Labs  Lab 11/05/21 1411 11/06/21 0352 11/07/21 0427 11/08/21 0552  NA 140 140 141 141  K 3.2* 4.5 4.0 3.6  CL 101 104 104 105  CO2 '31 27 27 28  '$ GLUCOSE 118* 162* 142* 126*  BUN 31* 31* 37* 41*  CREATININE 2.70* 2.77* 2.71* 2.78*  CALCIUM 8.7* 8.6* 8.9 8.7*  MG  --   --  2.3 2.2   Liver Function Tests: Recent Labs  Lab 11/05/21 1411  AST 16  ALT 9  ALKPHOS 46  BILITOT 0.7  PROT 6.2*  ALBUMIN 3.1*   CBG: Recent Labs  Lab 11/07/21 1054 11/07/21 1653 11/07/21 2123 11/08/21 0727 11/08/21 1120  GLUCAP 127* 145* 149* 115* 195*    Discharge time spent: greater than 30 minutes.  Signed: Tawni Millers, MD [facility] 11/08/2021

## 2021-11-08 NOTE — Assessment & Plan Note (Signed)
Smoking cessation counseling 

## 2021-11-08 NOTE — Assessment & Plan Note (Signed)
Echocardiogram with preserved LV systolic function with EF 60 to 65% with moderate LVH, RV systolic function preserved. No significant valvular disease.   At the time of his discharge patient will resume diuresis with furosemide.  Continue with carvedilol.

## 2021-11-08 NOTE — Assessment & Plan Note (Addendum)
Patient had acute exacerbation, that was treated with short course of systemic corticosteroids and bronchodilatory therapy.  At the time of his discharge his symptoms have improved.   Chronic hypoxemic respiratory failure, continue with supplemental 02 per [place].   Patient developed steroid induced psychosis, that has improved by the time of his discharge.  Required as needed haloperidol.

## 2021-11-08 NOTE — Progress Notes (Addendum)
Donald Gaines, M.D. Gastroenterology & Hepatology   Interval History:  No acute events overnight. Patient states feeling well denies any complaints.  He ate his dinner yesterday and breakfast today without any problems, nausea, fever, vomiting or abdominal pain. Has not presented any melena or hematochezia.  Hemoglobin has remained relatively stable at 9.2.  Inpatient Medications:  Current Facility-Administered Medications:    0.9 %  sodium chloride infusion, , Intravenous, PRN, Manuella Ghazi, Pratik D, DO, Stopped at 11/06/21 1807   acetaminophen (TYLENOL) tablet 650 mg, 650 mg, Oral, Q6H PRN, 650 mg at 11/07/21 2017 **OR** acetaminophen (TYLENOL) suppository 650 mg, 650 mg, Rectal, Q6H PRN, Elgergawy, Silver Huguenin, MD   albuterol (PROVENTIL) (2.5 MG/3ML) 0.083% nebulizer solution 2.5 mg, 2.5 mg, Nebulization, Q2H PRN, Elgergawy, Silver Huguenin, MD, 2.5 mg at 11/08/21 0238   haloperidol lactate (HALDOL) injection 1 mg, 1 mg, Intravenous, Q6H PRN, Manuella Ghazi, Pratik D, DO, 1 mg at 11/07/21 1107   insulin aspart (novoLOG) injection 0-5 Units, 0-5 Units, Subcutaneous, QHS, Elgergawy, Silver Huguenin, MD   insulin aspart (novoLOG) injection 0-9 Units, 0-9 Units, Subcutaneous, TID WC, Elgergawy, Silver Huguenin, MD, 1 Units at 11/07/21 1717   ipratropium-albuterol (DUONEB) 0.5-2.5 (3) MG/3ML nebulizer solution 3 mL, 3 mL, Nebulization, Q6H PRN, Manuella Ghazi, Pratik D, DO, 3 mL at 11/08/21 0238   lactated ringers infusion, , Intravenous, Continuous, Manuella Ghazi, Pratik D, DO, Last Rate: 75 mL/hr at 11/08/21 0629, New Bag at 11/08/21 0629   [START ON 11/09/2021] pantoprazole (PROTONIX) injection 40 mg, 40 mg, Intravenous, Q12H, Elgergawy, Silver Huguenin, MD   pantoprozole (PROTONIX) 80 mg /NS 100 mL infusion, 8 mg/hr, Intravenous, Continuous, Elgergawy, Silver Huguenin, MD, Last Rate: 10 mL/hr at 11/08/21 0458, 8 mg/hr at 11/08/21 0458   I/O    Intake/Output Summary (Last 24 hours) at 11/08/2021 0859 Last data filed at 11/08/2021 0858 Gross per 24 hour  Intake  1979.74 ml  Output 300 ml  Net 1679.74 ml     Physical Exam: Temp:  [97.3 F (36.3 C)-98.2 F (36.8 C)] 98.2 F (36.8 C) (08/19 0443) Pulse Rate:  [78-98] 98 (08/19 0443) Resp:  [16-20] 20 (08/19 0443) BP: (96-138)/(51-91) 121/76 (08/19 0443) SpO2:  [91 %-100 %] 97 % (08/19 0443) Weight:  [74.8 kg] 74.8 kg (08/18 1157)  Temp (24hrs), Avg:97.7 F (36.5 C), Min:97.3 F (36.3 C), Max:98.2 F (36.8 C) GENERAL: The patient is AO x3, in no acute distress. HEENT: Head is normocephalic and atraumatic. EOMI are intact. Mouth is well hydrated and without lesions. NECK: Supple. No masses LUNGS: Clear to auscultation. No presence of rhonchi/wheezing/rales. Adequate chest expansion HEART: RRR, normal s1 and s2. ABDOMEN: Soft, nontender, no guarding, no peritoneal signs, and nondistended. BS +. No masses. EXTREMITIES: Without any cyanosis, clubbing, rash, lesions or edema. NEUROLOGIC: AOx3, no focal motor deficit. SKIN: no jaundice, no rashes  Laboratory Data: CBC:     Component Value Date/Time   WBC 8.5 11/08/2021 0552   RBC 2.88 (L) 11/08/2021 0552   HGB 9.2 (L) 11/08/2021 0552   HCT 30.8 (L) 11/08/2021 0552   PLT 138 (L) 11/08/2021 0552   MCV 106.9 (H) 11/08/2021 0552   MCH 31.9 11/08/2021 0552   MCHC 29.9 (L) 11/08/2021 0552   RDW 16.4 (H) 11/08/2021 0552   LYMPHSABS 0.6 (L) 11/05/2021 1411   MONOABS 0.5 11/05/2021 1411   EOSABS 0.1 11/05/2021 1411   BASOSABS 0.0 11/05/2021 1411   COAG:  Lab Results  Component Value Date   INR 1.2 06/04/2020   INR  1.07 11/21/2013   INR 1.05 09/24/2013    BMP:     Latest Ref Rng & Units 11/08/2021    5:52 AM 11/07/2021    4:27 AM 11/06/2021    3:52 AM  BMP  Glucose 70 - 99 mg/dL 126  142  162   BUN 8 - 23 mg/dL 41  37  31   Creatinine 0.61 - 1.24 mg/dL 2.78  2.71  2.77   Sodium 135 - 145 mmol/L 141  141  140   Potassium 3.5 - 5.1 mmol/L 3.6  4.0  4.5   Chloride 98 - 111 mmol/L 105  104  104   CO2 22 - 32 mmol/L '28  27  27    ' Calcium 8.9 - 10.3 mg/dL 8.7  8.9  8.6     HEPATIC:     Latest Ref Rng & Units 11/05/2021    2:11 PM 06/04/2020    6:46 AM 07/27/2015    9:40 AM  Hepatic Function  Total Protein 6.5 - 8.1 g/dL 6.2  6.4  8.1   Albumin 3.5 - 5.0 g/dL 3.1  3.0  4.2   AST 15 - 41 U/L '16  15  23   ' ALT 0 - 44 U/L '9  10  11   ' Alk Phosphatase 38 - 126 U/L 46  73  89   Total Bilirubin 0.3 - 1.2 mg/dL 0.7  0.8  1.0     CARDIAC:  Lab Results  Component Value Date   TROPONINI <0.03 06/06/2014      Imaging: I personally reviewed and interpreted the available labs, imaging and endoscopic files.   Assessment/Plan: This is a 81 year old male with past medical history of COPD on home oxygen, atrial fibrillation off systemic anticoagulation due to recurrent GI bleeding, history of gastric and small bowel AVMs complicated by recurrent iron deficiency anemia, hyperlipidemia, history of myocardial infarction, sleep apnea, TIA, depression, BPH, who was admitted to the hospital after presenting worsening shortness of breath and chest pain with some episode of melena recently.  Patient was found to have drop in his hemoglobin down to 8.0 which responded to blood transfusion with repeat hemoglobin up to 9.4.  However he presented persistent decline in his hemoglobin and underwent an EGD that showed presence of a 10 mm pedunculated polyp in the second portion of the duodenum which was not removed.  He underwent a capsule endoscopy that showed some nonbleeding AVMs in the proximal duodenum.  Due to this, patient underwent a push enteroscopy yesterday that showed a 10 mm templated polyp again in the second portion of the duodenum.  There were a total of 7 AVMs in the duodenum and jejunum which were ablated with APC, one of the lesions was clipped x1 given the size of the lesion.  After he is endoscopic treatment, patient has not presented any recurrent bleeding.  After discussion with primary team (who also discussed with  cardiology), decision was held to hold off on his Plavix.  He will be acceptable to start him on low-dose aspirin 81 mg every day.  We will need to repeat a CBC in 1 week.  He will need to follow-up in the GI office and we will discuss scheduling him for repeat EGD in 6 to 8 weeks for removal of his duodenal polyp.  As a capsule endoscopy did not reach the cecum, he will need a repeat KUB in 1 week.  -Advance diet as tolerated -Check an H&H daily -Follow-up in GI  clinic in 3 to 4 weeks -Check CBC in 1 week as outpatient -KUB in 1 week. -Repeat EGD in 6-8 weeks for removal of duodenal polyp   Donald Peppers, MD Gastroenterology and Hepatology [facility]

## 2021-11-08 NOTE — Assessment & Plan Note (Signed)
CKD stage 3b.   Renal function remained with serum cr at 2,7 with K at 3,6 and serum bicarbonate at 28. He did received IV fluids during his hospitalization. At the time of his discharge he will resume diuretic therapy 40 mg po bid.  Plan to follow up renal function as outpatient in 7 days.

## 2021-11-08 NOTE — Assessment & Plan Note (Signed)
Continue with statin therapy and aspirin, no clopidogrel due to recurrent gi bleeding.

## 2021-11-08 NOTE — Assessment & Plan Note (Signed)
Rate control with carvedilol. No anticoagulation due to recurrent GI bleeding. Ok to resume low dose aspirin.

## 2021-11-09 LAB — BPAM RBC
Blood Product Expiration Date: 202309092359
Blood Product Expiration Date: 202309202359
ISSUE DATE / TIME: 202308162225
Unit Type and Rh: 5100
Unit Type and Rh: 5100

## 2021-11-09 LAB — TYPE AND SCREEN
ABO/RH(D): O POS
Antibody Screen: NEGATIVE
Donor AG Type: NEGATIVE
Donor AG Type: NEGATIVE
Unit division: 0
Unit division: 0

## 2021-11-10 ENCOUNTER — Encounter (HOSPITAL_COMMUNITY): Payer: Self-pay | Admitting: Internal Medicine

## 2021-11-11 ENCOUNTER — Encounter (HOSPITAL_COMMUNITY): Payer: Self-pay | Admitting: Internal Medicine

## 2021-11-11 NOTE — Progress Notes (Deleted)
Cardiology Office Note  Date: 11/11/2021   ID: [name], Donald Gaines 21-Sep-1940, MRN [id number]  PCP:  Donald Squibb, MD  Cardiologist:  Donald Lesches, MD Electrophysiologist:  None   No chief complaint on file.   History of Present Illness: Donald Gaines is a 81 y.o. male last seen in February.  He presents for a posthospital follow-up.  Recently evaluated for GI bleed with hemoglobin down to 8.0.  He was seen by GI, received 1 unit PRBCs and treated with pantoprazole.  Upper endoscopy showed normal stomach.  Capsule endoscopy revealed 4 small AVMs in the proximal small bowel with incomplete imaging of the distal small bowel.  Small bowel enteroscopy revealed 7 angiodysplastic lesions in the duodenum that were treated with argon plasma coagulation and clip placement.  He was taken off Plavix but continued on aspirin with plan for follow-up with GI as an outpatient.  Additional problems during his stay included acute on chronic renal failure with creatinine up to 2.78 from baseline 1.6-1.7.  He has a known history of permanent atrial fibrillation with CHA2DS2-VASc score of 6 but has not been anticoagulated given history of recurrent GI bleeding.  Past Medical History:  Diagnosis Date   Arteriovenous malformation small bowel    Capsule study 3/10   Bilateral carotid artery stenosis    BPH (benign prostatic hypertrophy)    Candida esophagitis (HCC)    Cataract    Chronic anemia    Chronic diarrhea    COPD (chronic obstructive pulmonary disease) (HCC)    Coronary atherosclerosis of native coronary artery    Multivessel, BMS SVG TO RCA 2000, reportedly  "2" additional stents in interim   Dementia ([place])    Depression    Diabetes mellitus, type 2 ([place])    Essential hypertension    GERD (gastroesophageal reflux disease)    GI bleeding    Cecal ulcers, arteriovenous malformations   Glaucoma    Hemorrhoids    Hyperlipidemia    Myocardial infarction ([place])    1994   Portal  hypertensive gastropathy ([place]) 11/08/2012   EGD. Dr. Laural Golden   Restrictive lung disease    Sleep apnea    TIA (transient ischemic attack)    2005    Past Surgical History:  Procedure Laterality Date   CAPSULE ENDO  03/10   cardiac catherization  04/2012   CARDIAC CATHETERIZATION  04/05/1996 Donald Gaines   EF 45%, occluded SVG to circumflex, patent SVG to D1 and PDA and patent LIMA to LAD with severe native vessel disease.   CARDIAC CATHETERIZATION  08/22/1998 Donald Gaines   Mild LM, severe LAD,severe CX, occlude RCA, patient  SVG to diatal RCA, patent vein graft to D1 and patent LIMA to LAD.    CATARACT EXTRACTION W/PHACO Right 09/22/2012   Procedure: CATARACT EXTRACTION PHACO AND INTRAOCULAR LENS PLACEMENT (IOC);  Surgeon: Donald Branch, MD;  Location: [facility];  Service: Ophthalmology;  Laterality: Right;  CDE: 11.43   CATARACT EXTRACTION W/PHACO Left 10/17/2012   Procedure: CATARACT EXTRACTION PHACO AND INTRAOCULAR LENS PLACEMENT (IOC);  Surgeon: Donald Branch, MD;  Location: [facility];  Service: Ophthalmology;  Laterality: Left;  CDE: 8.06   COLONOSCOPY  APR 2008   SIMPLE ADENOMA, [place] TICS, IH   COLONOSCOPY  FEB 2008 ANEMIA. MELENA    2 LARGE ILEOCEAL ULCERS 2o to ASA, [place] TICS, IH   COLONOSCOPY  SEP 2009 TRANSFUSION DEP ANEMIA   AC AVM-ABLATED, [place] TICS, IH   COLONOSCOPY WITH ESOPHAGOGASTRODUODENOSCOPY (EGD)  N/A 11/08/2012   Procedure: COLONOSCOPY WITH ESOPHAGOGASTRODUODENOSCOPY (EGD);  Surgeon: Donald Houston, MD;  [facility]: [facility];  Service: Endoscopy;  Laterality: N/A;  250-rescheduled to 7:30 Ann to notify pt   CORONARY ANGIOPLASTY WITH STENT PLACEMENT  08/22/1998 Donald Gaines   TEC stenting of the SVG to RCA-Last seen by cardiologist in 2010.   CORONARY ARTERY BYPASS GRAFT  1995-triple bypass   LIMA-LAD, LIMA to intermediate Gaines, SVG to PD and second OM,   ESOPHAGOGASTRODUODENOSCOPY  SEP 09   DUODENAL LIPOMA   ESOPHAGOGASTRODUODENOSCOPY N/A 09/27/2013   Procedure:  ESOPHAGOGASTRODUODENOSCOPY (EGD) Push enteroscopy;  Surgeon: Donald Houston, MD;  Location: [facility];  Service: Endoscopy;  Laterality: N/A;   GIVENS CAPSULE STUDY N/A 11/24/2012   Procedure: GIVENS CAPSULE STUDY;  Surgeon: Donald Houston, MD;  [facility]: [facility];  Service: Endoscopy;  Laterality: N/A;  [place] 11/06/2021   Procedure: GIVENS CAPSULE STUDY;  Surgeon: Donald Harman, DO;  Location: [facility];  Service: Endoscopy;  Laterality: N/A;   HOT HEMOSTASIS  09/27/2013   Procedure: HOT HEMOSTASIS (ARGON PLASMA COAGULATION/BICAP);  Surgeon: Donald Houston, MD;  Location: [facility];  Service: Endoscopy;;   HYDROGEN BREATH TEST  2009   RIGHT INGUINAL HERNIA REPAIR     UPPER GASTROINTESTINAL ENDOSCOPY  SEP 2009   GASTRIC AVM ABLATED, NL DUO Bx    Current Outpatient Medications  Medication Sig Dispense Refill   acetaminophen (TYLENOL) 500 MG tablet Take 500 mg by mouth every 6 (six) hours as needed for headache.     albuterol (PROVENTIL) (2.5 MG/3ML) 0.083% nebulizer solution Take 3 mLs (2.5 mg total) by nebulization every 2 (two) hours as needed for wheezing or shortness of breath. 75 mL 12   ALPRAZolam (XANAX) 0.5 MG tablet Take 0.5 mg by mouth 2 (two) times daily as needed for anxiety.     carvedilol (COREG) 12.5 MG tablet Take 12.5 mg by mouth 2 (two) times daily with a meal.      cetirizine (ZYRTEC) 10 MG tablet Take 10 mg by mouth at bedtime.      Cholecalciferol (VITAMIN D3) 1.25 MG (50000 UT) CAPS Take 1 capsule by mouth once a week.     escitalopram (LEXAPRO) 10 MG tablet Take 10 mg by mouth daily.     ferrous sulfate 325 (65 FE) MG tablet Take 325 mg by mouth daily with breakfast.     furosemide (LASIX) 40 MG tablet Take 1 tablet (40 mg total) by mouth 2 (two) times daily. 30 tablet    gabapentin (NEURONTIN) 100 MG capsule Take 100 mg by mouth daily.     metFORMIN (GLUCOPHAGE) 500 MG tablet Take 500 mg by mouth daily with breakfast.      NITROSTAT 0.4 MG SL tablet Place 0.4 mg under the tongue every 5 (five) minutes as needed for chest pain.      omeprazole (PRILOSEC) 20 MG capsule Take 20 mg by mouth daily.      rosuvastatin (CRESTOR) 20 MG tablet Take 1 tablet (20 mg total) by mouth daily. 90 tablet 0   umeclidinium-vilanterol (ANORO ELLIPTA) 62.5-25 MCG/INH AEPB Inhale 1 puff into the lungs daily.     No current facility-administered medications for this visit.   Allergies:  Penicillins   Social History: The patient  reports that he has been smoking cigarettes. He started smoking about 72 years ago. He has a 60.00 pack-year smoking history. He has never used  smokeless tobacco. He reports current drug use. Drug: Marijuana. He reports that he does not drink alcohol.   Family History: The patient's family history includes Heart attack in his mother; Heart disease in his mother; Hypertension in his mother.   ROS:  Please see the history of present illness. Otherwise, complete review of systems is positive for {NONE DEFAULTED:18576}.  All other systems are reviewed and negative.   Physical Exam: VS:  There were no vitals taken for this visit., BMI There is no height or weight on file to calculate BMI.  Wt Readings from Last 3 Encounters:  11/07/21 164 lb 14.5 oz (74.8 kg)  04/24/21 167 lb 12.8 oz (76.1 kg)  08/22/20 198 lb (89.8 kg)    General: Patient appears comfortable at rest. HEENT: Conjunctiva and lids normal, oropharynx clear with moist mucosa. Neck: Supple, no elevated JVP or carotid bruits, no thyromegaly. Lungs: Clear to auscultation, nonlabored breathing at rest. Cardiac: Regular rate and rhythm, no S3 or significant systolic murmur, no pericardial rub. Abdomen: Soft, nontender, no hepatomegaly, bowel sounds present, no guarding or rebound. Extremities: No pitting edema, distal pulses 2+. Skin: Warm and dry. Musculoskeletal: No kyphosis. Neuropsychiatric: Alert and oriented x3, affect grossly  appropriate.  ECG:  An ECG dated 11/05/2021 was personally reviewed today and demonstrated:  Atrial fibrillation with IVCD and repolarization abnormalities.  Recent Labwork: 11/05/2021: ALT 9; AST 16; B Natriuretic Peptide 736.0 11/08/2021: BUN 41; Creatinine, Ser 2.78; Hemoglobin 9.2; Magnesium 2.2; Platelets 138; Potassium 3.6; Sodium 141     Component Value Date/Time   CHOL 120 01/07/2011 1050   TRIG 167 (H) 01/07/2011 1050   HDL 37 (L) 01/07/2011 1050   CHOLHDL 3.2 01/07/2011 1050   VLDL 33 01/07/2011 1050   LDLCALC 50 01/07/2011 1050    Other Studies Reviewed Today:  Echocardiogram 06/03/2020:  1. Poor acoustic window and ectopy make evaluation of LVEF difficult,  even with the use of Defiinity. Left ventricular ejection fraction, by  estimation, is 60 to 65%. There is moderate left ventricular hypertrophy.  Left ventricular diastolic parameters  are indeterminate.   2. Right ventricular systolic function is normal. The right ventricular  size is normal.   3. Left atrial size was severely dilated.   4. Right atrial size was mildly dilated.   5. The mitral valve is normal in structure. Trivial mitral valve  regurgitation.   6. Aortic valve regurgitation is not visualized. Mild to moderate aortic  valve sclerosis/calcification is present, without any evidence of aortic  stenosis.   7. The inferior vena cava is normal in size with greater than 50%  respiratory variability, suggesting right atrial pressure of 3 mmHg.   Assessment and Plan:    Medication Adjustments/Labs and Tests Ordered: Current medicines are reviewed at length with the patient today.  Concerns regarding medicines are outlined above.   Tests Ordered: No orders of the defined types were placed in this encounter.   Medication Changes: No orders of the defined types were placed in this encounter.   Disposition:  Follow up {follow up:15908}  Signed, Satira Sark, MD, [facility] 11/11/2021 1:47 PM     [facility] [address], [place], [place] [address] Phone: [phone]; Fax: [phone]

## 2021-11-12 ENCOUNTER — Ambulatory Visit: Payer: Medicare HMO | Admitting: Cardiology

## 2021-11-12 DIAGNOSIS — I25119 Atherosclerotic heart disease of native coronary artery with unspecified angina pectoris: Secondary | ICD-10-CM

## 2021-11-12 DIAGNOSIS — I4821 Permanent atrial fibrillation: Secondary | ICD-10-CM

## 2021-11-13 ENCOUNTER — Ambulatory Visit (INDEPENDENT_AMBULATORY_CARE_PROVIDER_SITE_OTHER): Payer: Medicare HMO | Admitting: Cardiology

## 2021-11-13 ENCOUNTER — Other Ambulatory Visit (HOSPITAL_COMMUNITY): Payer: Self-pay | Admitting: Family Medicine

## 2021-11-13 ENCOUNTER — Encounter: Payer: Self-pay | Admitting: Cardiology

## 2021-11-13 VITALS — BP 110/70 | HR 86 | Ht 65.0 in | Wt 170.2 lb

## 2021-11-13 DIAGNOSIS — I4821 Permanent atrial fibrillation: Secondary | ICD-10-CM | POA: Diagnosis not present

## 2021-11-13 DIAGNOSIS — Z8719 Personal history of other diseases of the digestive system: Secondary | ICD-10-CM | POA: Diagnosis not present

## 2021-11-13 DIAGNOSIS — I25119 Atherosclerotic heart disease of native coronary artery with unspecified angina pectoris: Secondary | ICD-10-CM | POA: Diagnosis not present

## 2021-11-13 DIAGNOSIS — N184 Chronic kidney disease, stage 4 (severe): Secondary | ICD-10-CM | POA: Diagnosis not present

## 2021-11-13 DIAGNOSIS — K922 Gastrointestinal hemorrhage, unspecified: Secondary | ICD-10-CM

## 2021-11-13 NOTE — Progress Notes (Signed)
Cardiology Office Note  Date: 11/13/2021   ID: Donald Gaines, DOB [birth year], MRN [id number]  PCP:  Celene Squibb, MD  Cardiologist:  Rozann Lesches, MD Electrophysiologist:  None   Chief Complaint  Patient presents with   Hospitalization Follow-up    History of Present Illness: Donald Gaines is a 81 y.o. male last seen in February.  He presents for a posthospital follow-up.  Recently evaluated for GI bleed with hemoglobin down to 8.0.  He was seen by GI, received 1 unit PRBCs and treated with pantoprazole.  Upper endoscopy showed normal stomach.  Capsule endoscopy revealed 4 small AVMs in the proximal small bowel with incomplete imaging of the distal small bowel.  Small bowel enteroscopy revealed 7 angiodysplastic lesions in the duodenum that were treated with argon plasma coagulation and clip placement.  He was taken off Plavix but continued on aspirin with plan for follow-up with GI as an outpatient.  Additional problems during his stay included acute on chronic renal failure with creatinine up to 2.78 from baseline 1.6-1.7.  He has a known history of permanent atrial fibrillation with CHA2DS2-VASc score of 6 but has not been anticoagulated given history of recurrent GI bleeding.  He is here for follow-up today.  Reports no increasing angina.  Has had chest congestion, significant COPD with longstanding tobacco use.  Reports trouble with his balance, has had a fall, using a cane at home.  He does not report any definite change in his stools and has follow-up with GI pending.  We went over his medications today and I talked with him about staying off Plavix.  His baseline cardiac regimen will now include Coreg, Crestor, Lasix, and as needed nitroglycerin.  Of note, Plavix was not only for his cardiovascular status, has history of TIA in the past.  Past Medical History:  Diagnosis Date   Arteriovenous malformation small bowel    Capsule study 3/10   Bilateral carotid artery  stenosis    BPH (benign prostatic hypertrophy)    Candida esophagitis (HCC)    Cataract    Chronic anemia    Chronic diarrhea    COPD (chronic obstructive pulmonary disease) (HCC)    Coronary atherosclerosis of native coronary artery    Multivessel, BMS SVG TO RCA 2000, reportedly  "2" additional stents in interim   Dementia ([place])    Depression    Diabetes mellitus, type 2 ([place])    Essential hypertension    GERD (gastroesophageal reflux disease)    GI bleeding    Cecal ulcers, arteriovenous malformations   Glaucoma    Hemorrhoids    Hyperlipidemia    Myocardial infarction ([place])    1994   Portal hypertensive gastropathy ([place]) 11/08/2012   EGD. Dr. Laural Golden   Restrictive lung disease    Sleep apnea    TIA (transient ischemic attack)    2005    Past Surgical History:  Procedure Laterality Date   CAPSULE ENDO  03/10   cardiac catherization  04/2012   CARDIAC CATHETERIZATION  04/05/1996 [name]   EF 45%, occluded SVG to circumflex, patent SVG to D1 and PDA and patent LIMA to LAD with severe native vessel disease.   CARDIAC CATHETERIZATION  08/22/1998 [name]   Mild LM, severe LAD,severe CX, occlude RCA, patient  SVG to diatal RCA, patent vein graft to D1 and patent LIMA to LAD.    CATARACT EXTRACTION W/PHACO Right 09/22/2012   Procedure: CATARACT EXTRACTION PHACO AND INTRAOCULAR LENS PLACEMENT (IOC);  Surgeon: Tonny Branch, MD;  Location: [facility];  Service: Ophthalmology;  Laterality: Right;  CDE: 11.43   CATARACT EXTRACTION W/PHACO Left 10/17/2012   Procedure: CATARACT EXTRACTION PHACO AND INTRAOCULAR LENS PLACEMENT (IOC);  Surgeon: Tonny Branch, MD;  Location: [facility];  Service: Ophthalmology;  Laterality: Left;  CDE: 8.06   COLONOSCOPY  APR 2008   SIMPLE ADENOMA, [place] TICS, IH   COLONOSCOPY  FEB 2008 ANEMIA. MELENA    2 LARGE ILEOCEAL ULCERS 2o to ASA, [place] TICS, IH   COLONOSCOPY  SEP 2009 TRANSFUSION DEP ANEMIA   AC AVM-ABLATED, [place] TICS, IH   COLONOSCOPY WITH  ESOPHAGOGASTRODUODENOSCOPY (EGD) N/A 11/08/2012   Procedure: COLONOSCOPY WITH ESOPHAGOGASTRODUODENOSCOPY (EGD);  Surgeon: Rogene Houston, MD;  [facility];  Service: Endoscopy;  Laterality: N/A;  250-rescheduled to 7:30 Ann to notify pt   CORONARY ANGIOPLASTY WITH STENT PLACEMENT  08/22/1998 [name]   TEC stenting of the SVG to RCA-Last seen by cardiologist in 2010.   CORONARY ARTERY BYPASS GRAFT  1995-triple bypass   LIMA-LAD, LIMA to intermediate branch, SVG to PD and second OM,   ESOPHAGOGASTRODUODENOSCOPY  SEP 09   DUODENAL LIPOMA   ESOPHAGOGASTRODUODENOSCOPY N/A 09/27/2013   Procedure: ESOPHAGOGASTRODUODENOSCOPY (EGD) Push enteroscopy;  Surgeon: Rogene Houston, MD;  [facility];  Service: Endoscopy;  Laterality: N/A;   ESOPHAGOGASTRODUODENOSCOPY (EGD) WITH PROPOFOL N/A 11/06/2021   Procedure: ESOPHAGOGASTRODUODENOSCOPY (EGD) WITH PROPOFOL;  Surgeon: Eloise Harman, DO;  [facility];  Service: Endoscopy;  Laterality: N/A;   GIVENS CAPSULE STUDY N/A 11/24/2012   Procedure: GIVENS CAPSULE STUDY;  Surgeon: Rogene Houston, MD;  [facility];  Service: Endoscopy;  Laterality: N/A;  [place] 11/06/2021   Procedure: GIVENS CAPSULE STUDY;  Surgeon: Eloise Harman, DO;  [facility];  Service: Endoscopy;  Laterality: N/A;   HOT HEMOSTASIS  09/27/2013   Procedure: HOT HEMOSTASIS (ARGON PLASMA COAGULATION/BICAP);  Surgeon: Rogene Houston, MD;  [facility];  Service: Endoscopy;;   HYDROGEN BREATH TEST  2009   RIGHT INGUINAL HERNIA REPAIR     UPPER GASTROINTESTINAL ENDOSCOPY  SEP 2009   GASTRIC AVM ABLATED, NL DUO Bx    Current Outpatient Medications  Medication Sig Dispense Refill   acetaminophen (TYLENOL) 500 MG tablet Take 500 mg by mouth every 6 (six) hours as needed for headache.     albuterol (PROVENTIL) (2.5 MG/3ML) 0.083% nebulizer solution Take 3 mLs (2.5 mg total) by nebulization every 2  (two) hours as needed for wheezing or shortness of breath. 75 mL 12   ALPRAZolam (XANAX) 0.5 MG tablet Take 0.5 mg by mouth 2 (two) times daily as needed for anxiety.     carvedilol (COREG) 12.5 MG tablet Take 12.5 mg by mouth 2 (two) times daily with a meal.      cetirizine (ZYRTEC) 10 MG tablet Take 10 mg by mouth at bedtime.      Cholecalciferol (VITAMIN D3) 1.25 MG (50000 UT) CAPS Take 1 capsule by mouth once a week.     escitalopram (LEXAPRO) 10 MG tablet Take 10 mg by mouth daily.     ferrous sulfate 325 (65 FE) MG tablet Take 325 mg by mouth daily with breakfast.     furosemide (LASIX) 40 MG tablet Take 1 tablet (40 mg total) by mouth 2 (two) times daily. 30 tablet    gabapentin (NEURONTIN) 100 MG capsule Take 100 mg by mouth daily.  metFORMIN (GLUCOPHAGE) 500 MG tablet Take 500 mg by mouth daily with breakfast.     NITROSTAT 0.4 MG SL tablet Place 0.4 mg under the tongue every 5 (five) minutes as needed for chest pain.      omeprazole (PRILOSEC) 20 MG capsule Take 20 mg by mouth daily.      rosuvastatin (CRESTOR) 20 MG tablet Take 1 tablet (20 mg total) by mouth daily. 90 tablet 0   umeclidinium-vilanterol (ANORO ELLIPTA) 62.5-25 MCG/INH AEPB Inhale 1 puff into the lungs daily.     No current facility-administered medications for this visit.   Allergies:  Penicillins   ROS: No syncope.  Physical Exam: VS:  BP 110/70 (BP Location: Left Arm, Patient Position: Sitting, Cuff Size: Normal)   Pulse 86   Ht '5\' 5"'$  (1.651 m)   Wt 170 lb 3.2 oz (77.2 kg)   SpO2 91%   BMI 28.32 kg/m , BMI Body mass index is 28.32 kg/m.  Wt Readings from Last 3 Encounters:  11/13/21 170 lb 3.2 oz (77.2 kg)  11/07/21 164 lb 14.5 oz (74.8 kg)  04/24/21 167 lb 12.8 oz (76.1 kg)    General: Patient appears comfortable at rest. Neck: Supple, no elevated JVP or carotid bruits, no thyromegaly. Lungs: Coarse breath sounds with scattered rhonchi and prolonged expiratory phase. Cardiac: Irregularly  irregular, no S3, 1/6 systolic murmur. Extremities: No pitting edema, distal pulses 2+.  ECG:  An ECG dated 11/05/2021 was personally reviewed today and demonstrated:  Atrial fibrillation with IVCD and repolarization abnormalities.  Recent Labwork: 11/05/2021: ALT 9; AST 16; B Natriuretic Peptide 736.0 11/08/2021: BUN 41; Creatinine, Ser 2.78; Hemoglobin 9.2; Magnesium 2.2; Platelets 138; Potassium 3.6; Sodium 141     Component Value Date/Time   CHOL 120 01/07/2011 1050   TRIG 167 (H) 01/07/2011 1050   HDL 37 (L) 01/07/2011 1050   CHOLHDL 3.2 01/07/2011 1050   VLDL 33 01/07/2011 1050   LDLCALC 50 01/07/2011 1050    Other Studies Reviewed Today:  Echocardiogram 06/03/2020:  1. Poor acoustic window and ectopy make evaluation of LVEF difficult,  even with the use of Defiinity. Left ventricular ejection fraction, by  estimation, is 60 to 65%. There is moderate left ventricular hypertrophy.  Left ventricular diastolic parameters  are indeterminate.   2. Right ventricular systolic function is normal. The right ventricular  size is normal.   3. Left atrial size was severely dilated.   4. Right atrial size was mildly dilated.   5. The mitral valve is normal in structure. Trivial mitral valve  regurgitation.   6. Aortic valve regurgitation is not visualized. Mild to moderate aortic  valve sclerosis/calcification is present, without any evidence of aortic  stenosis.   7. The inferior vena cava is normal in size with greater than 50%  respiratory variability, suggesting right atrial pressure of 3 mmHg.   Assessment and Plan:  1.  Recurrent GI bleed as discussed above with continued follow-up per GI.  Agree with discontinuation of Plavix, would not resume going forward.  2.  CAD status post CABG and PCI.  Could stay on low-dose aspirin if GI status stable, but would not resume Plavix.  Otherwise continue Coreg, Crestor, and as needed nitroglycerin.  3.  Permanent atrial fibrillation with  CHA2DS2-VASc score of 6.  He has not been anticoagulated given his history of recurrent GI bleeding.  Heart rate control is good on current dose of Coreg.  4.  Acute on chronic renal failure with CKD stage III-IV.  Creatinine 2.78 most recently.  Potassium normal.  Medication Adjustments/Labs and Tests Ordered: Current medicines are reviewed at length with the patient today.  Concerns regarding medicines are outlined above.   Tests Ordered: No orders of the defined types were placed in this encounter.   Medication Changes: No orders of the defined types were placed in this encounter.   Disposition:  Follow up  3 months.  Signed, Satira Sark, MD, [facility] 11/13/2021 1:54 PM    [facility] [facility] [place], [place], [place] [address] Phone: [phone]; Fax: [phone]

## 2021-11-13 NOTE — Patient Instructions (Addendum)
Medication Instructions:  Your physician recommends that you continue on your current medications as directed. Please refer to the Current Medication list given to you today.   Labwork: none  Testing/Procedures: none  Follow-Up:  Your physician recommends that you schedule a follow-up appointment in: 3-4 months  Any Other Special Instructions Will Be Listed Below (If Applicable).  If you need a refill on your cardiac medications before your next appointment, please call your pharmacy.  

## 2021-11-14 ENCOUNTER — Encounter (HOSPITAL_COMMUNITY): Payer: Self-pay | Admitting: Gastroenterology

## 2021-11-20 ENCOUNTER — Ambulatory Visit: Payer: Medicare HMO | Admitting: Cardiology

## 2021-11-20 DIAGNOSIS — I4821 Permanent atrial fibrillation: Secondary | ICD-10-CM | POA: Diagnosis not present

## 2021-11-20 DIAGNOSIS — Z8719 Personal history of other diseases of the digestive system: Secondary | ICD-10-CM | POA: Diagnosis not present

## 2021-11-26 ENCOUNTER — Ambulatory Visit (INDEPENDENT_AMBULATORY_CARE_PROVIDER_SITE_OTHER): Payer: Medicare HMO | Admitting: Gastroenterology

## 2021-12-02 ENCOUNTER — Other Ambulatory Visit (INDEPENDENT_AMBULATORY_CARE_PROVIDER_SITE_OTHER): Payer: Self-pay

## 2021-12-02 ENCOUNTER — Encounter (INDEPENDENT_AMBULATORY_CARE_PROVIDER_SITE_OTHER): Payer: Self-pay

## 2021-12-02 ENCOUNTER — Telehealth (INDEPENDENT_AMBULATORY_CARE_PROVIDER_SITE_OTHER): Payer: Self-pay

## 2021-12-02 DIAGNOSIS — D132 Benign neoplasm of duodenum: Secondary | ICD-10-CM

## 2021-12-02 DIAGNOSIS — I10 Essential (primary) hypertension: Secondary | ICD-10-CM

## 2021-12-02 NOTE — Telephone Encounter (Signed)
Donald Gaines Donald Gaines, CMA  ?

## 2021-12-02 NOTE — Telephone Encounter (Signed)
Egd is scheduled on Tuesday 01/20/22 in a 45 min slot and patient is aware

## 2021-12-02 NOTE — Telephone Encounter (Signed)
Thanks, needs to stop Plavix 5 days before, please obtain clearance

## 2021-12-02 NOTE — Telephone Encounter (Signed)
-----   Message from Harvel Quale, MD sent at 11/27/2021  2:51 PM EDT ----- Tomasita Morrow,   Can you please schedule a EGD (45 minutes slot) in October? Dx: duodenal adenoma. Room: 3  Thanks,  Maylon Peppers, MD Gastroenterology and Hepatology [facility]

## 2021-12-06 DIAGNOSIS — J449 Chronic obstructive pulmonary disease, unspecified: Secondary | ICD-10-CM | POA: Diagnosis not present

## 2021-12-12 ENCOUNTER — Ambulatory Visit (HOSPITAL_COMMUNITY)
Admission: RE | Admit: 2021-12-12 | Discharge: 2021-12-12 | Disposition: A | Discharge status: Home / Self Care | Payer: Medicare HMO | Source: Ambulatory Visit | Attending: Family Medicine | Admitting: Family Medicine

## 2021-12-12 DIAGNOSIS — R109 Unspecified abdominal pain: Secondary | ICD-10-CM | POA: Diagnosis not present

## 2021-12-12 DIAGNOSIS — K922 Gastrointestinal hemorrhage, unspecified: Secondary | ICD-10-CM | POA: Insufficient documentation

## 2021-12-16 ENCOUNTER — Other Ambulatory Visit (HOSPITAL_COMMUNITY): Payer: Self-pay | Admitting: Family Medicine

## 2021-12-16 DIAGNOSIS — R109 Unspecified abdominal pain: Secondary | ICD-10-CM

## 2021-12-19 ENCOUNTER — Telehealth: Payer: Self-pay

## 2021-12-19 DIAGNOSIS — I10 Essential (primary) hypertension: Secondary | ICD-10-CM

## 2021-12-22 ENCOUNTER — Ambulatory Visit: Payer: Self-pay | Admitting: Licensed Clinical Social Worker

## 2021-12-22 NOTE — Patient Instructions (Signed)
Visit Information  Thank you for taking time to visit with me today. Please don't hesitate to contact me if I can be of assistance to you.   Following are the goals we discussed today:   Goals Addressed               This Visit's Progress     Care Coordination Activities- Meals on Wheels referral (pt-stated)        Patient in need of food resources. SW submitted referral for Meals on wheels.           The care management team will reach out to the patient again over the next 7 days.   Lenor Derrick ,MSW Social Worker [facility] Care Management  [phone]

## 2021-12-22 NOTE — Patient Outreach (Signed)
  Care Coordination   Initial Visit Note   12/22/2021 Name: Donald Gaines MRN: [id number] DOB: Jun 03, 1940  Donald Gaines is a 81 y.o. year old male who sees [place] Crane, Edwinna Areola, MD for primary care. I  submitted a referral for Meals on Wheels. SW unsuccesful outreach to patient. Patients VM is not established. SW will attempt patient again. Meals on wheels referral submitted.   What matters to the patients health and wellness today?  Food Insecurity    Goals Addressed               This Visit's Progress     Care Coordination Activities- Meals on Wheels referral (pt-stated)        Patient in need of food resources. SW submitted referral for Meals on wheels.         SDOH assessments and interventions completed:  Yes  SDOH Interventions Today    Flowsheet Row Most Recent Value  SDOH Interventions   Food Insecurity Interventions [id number] Referral        Care Coordination Interventions Activated:  Yes  Care Coordination Interventions:  Yes, provided   Follow up plan: Follow up call scheduled for within the next 7days.    Encounter Outcome:  Pt. Scheduled

## 2021-12-23 DIAGNOSIS — J449 Chronic obstructive pulmonary disease, unspecified: Secondary | ICD-10-CM | POA: Diagnosis not present

## 2021-12-23 DIAGNOSIS — R69 Illness, unspecified: Secondary | ICD-10-CM | POA: Diagnosis not present

## 2021-12-23 DIAGNOSIS — I2510 Atherosclerotic heart disease of native coronary artery without angina pectoris: Secondary | ICD-10-CM | POA: Diagnosis not present

## 2021-12-23 DIAGNOSIS — R809 Proteinuria, unspecified: Secondary | ICD-10-CM | POA: Diagnosis not present

## 2021-12-23 DIAGNOSIS — E782 Mixed hyperlipidemia: Secondary | ICD-10-CM | POA: Diagnosis not present

## 2021-12-23 DIAGNOSIS — I129 Hypertensive chronic kidney disease with stage 1 through stage 4 chronic kidney disease, or unspecified chronic kidney disease: Secondary | ICD-10-CM | POA: Diagnosis not present

## 2021-12-23 DIAGNOSIS — I4821 Permanent atrial fibrillation: Secondary | ICD-10-CM | POA: Diagnosis not present

## 2021-12-23 DIAGNOSIS — Z8719 Personal history of other diseases of the digestive system: Secondary | ICD-10-CM | POA: Diagnosis not present

## 2021-12-23 DIAGNOSIS — E440 Moderate protein-calorie malnutrition: Secondary | ICD-10-CM | POA: Diagnosis not present

## 2021-12-23 DIAGNOSIS — D631 Anemia in chronic kidney disease: Secondary | ICD-10-CM | POA: Diagnosis not present

## 2021-12-23 DIAGNOSIS — E118 Type 2 diabetes mellitus with unspecified complications: Secondary | ICD-10-CM | POA: Diagnosis not present

## 2021-12-23 DIAGNOSIS — E559 Vitamin D deficiency, unspecified: Secondary | ICD-10-CM | POA: Diagnosis not present

## 2021-12-31 ENCOUNTER — Telehealth: Payer: Self-pay | Admitting: Gastroenterology

## 2021-12-31 NOTE — Telephone Encounter (Signed)
I received the results of the most recent blood workup performed on 12/23/2021. Iron stores were normal (iron 107, ferritin 30, iron sat 30%, but there was anemia with Hb 7.5 and MCV 102. This corresponds to a possible mixed anemia - due to chronic disease , possible B12/folic acid deficiency

## 2022-01-05 DIAGNOSIS — J449 Chronic obstructive pulmonary disease, unspecified: Secondary | ICD-10-CM | POA: Diagnosis not present

## 2022-01-07 DIAGNOSIS — I959 Hypotension, unspecified: Secondary | ICD-10-CM | POA: Diagnosis not present

## 2022-01-07 DIAGNOSIS — R0981 Nasal congestion: Secondary | ICD-10-CM | POA: Diagnosis not present

## 2022-01-07 DIAGNOSIS — R059 Cough, unspecified: Secondary | ICD-10-CM | POA: Diagnosis not present

## 2022-01-07 DIAGNOSIS — I509 Heart failure, unspecified: Secondary | ICD-10-CM | POA: Diagnosis not present

## 2022-01-07 DIAGNOSIS — R0689 Other abnormalities of breathing: Secondary | ICD-10-CM | POA: Diagnosis not present

## 2022-01-07 DIAGNOSIS — J811 Chronic pulmonary edema: Secondary | ICD-10-CM | POA: Diagnosis not present

## 2022-01-07 DIAGNOSIS — R0902 Hypoxemia: Secondary | ICD-10-CM | POA: Diagnosis not present

## 2022-01-07 DIAGNOSIS — E876 Hypokalemia: Secondary | ICD-10-CM | POA: Diagnosis not present

## 2022-01-07 DIAGNOSIS — J90 Pleural effusion, not elsewhere classified: Secondary | ICD-10-CM | POA: Diagnosis not present

## 2022-01-07 DIAGNOSIS — R531 Weakness: Secondary | ICD-10-CM | POA: Diagnosis not present

## 2022-01-07 DIAGNOSIS — J441 Chronic obstructive pulmonary disease with (acute) exacerbation: Secondary | ICD-10-CM | POA: Diagnosis not present

## 2022-01-07 DIAGNOSIS — J189 Pneumonia, unspecified organism: Secondary | ICD-10-CM | POA: Diagnosis not present

## 2022-01-07 DIAGNOSIS — Z743 Need for continuous supervision: Secondary | ICD-10-CM | POA: Diagnosis not present

## 2022-01-08 DIAGNOSIS — Z79899 Other long term (current) drug therapy: Secondary | ICD-10-CM | POA: Diagnosis not present

## 2022-01-08 DIAGNOSIS — J441 Chronic obstructive pulmonary disease with (acute) exacerbation: Secondary | ICD-10-CM | POA: Diagnosis not present

## 2022-01-08 DIAGNOSIS — R54 Age-related physical debility: Secondary | ICD-10-CM | POA: Diagnosis not present

## 2022-01-08 DIAGNOSIS — Z87891 Personal history of nicotine dependence: Secondary | ICD-10-CM | POA: Diagnosis not present

## 2022-01-08 DIAGNOSIS — R7989 Other specified abnormal findings of blood chemistry: Secondary | ICD-10-CM | POA: Diagnosis not present

## 2022-01-08 DIAGNOSIS — Z9989 Dependence on other enabling machines and devices: Secondary | ICD-10-CM | POA: Diagnosis not present

## 2022-01-08 DIAGNOSIS — J9602 Acute respiratory failure with hypercapnia: Secondary | ICD-10-CM | POA: Diagnosis not present

## 2022-01-08 DIAGNOSIS — Z91119 Patient's noncompliance with dietary regimen due to unspecified reason: Secondary | ICD-10-CM | POA: Diagnosis not present

## 2022-01-08 DIAGNOSIS — R0602 Shortness of breath: Secondary | ICD-10-CM | POA: Diagnosis not present

## 2022-01-08 DIAGNOSIS — J440 Chronic obstructive pulmonary disease with acute lower respiratory infection: Secondary | ICD-10-CM | POA: Diagnosis not present

## 2022-01-08 DIAGNOSIS — I509 Heart failure, unspecified: Secondary | ICD-10-CM | POA: Diagnosis not present

## 2022-01-08 DIAGNOSIS — D631 Anemia in chronic kidney disease: Secondary | ICD-10-CM | POA: Diagnosis not present

## 2022-01-08 DIAGNOSIS — Z66 Do not resuscitate: Secondary | ICD-10-CM | POA: Diagnosis not present

## 2022-01-08 DIAGNOSIS — J90 Pleural effusion, not elsewhere classified: Secondary | ICD-10-CM | POA: Diagnosis not present

## 2022-01-08 DIAGNOSIS — I4821 Permanent atrial fibrillation: Secondary | ICD-10-CM | POA: Diagnosis not present

## 2022-01-08 DIAGNOSIS — I4891 Unspecified atrial fibrillation: Secondary | ICD-10-CM | POA: Diagnosis not present

## 2022-01-08 DIAGNOSIS — Z9981 Dependence on supplemental oxygen: Secondary | ICD-10-CM | POA: Diagnosis not present

## 2022-01-08 DIAGNOSIS — J9621 Acute and chronic respiratory failure with hypoxia: Secondary | ICD-10-CM | POA: Diagnosis not present

## 2022-01-08 DIAGNOSIS — I2510 Atherosclerotic heart disease of native coronary artery without angina pectoris: Secondary | ICD-10-CM | POA: Diagnosis not present

## 2022-01-08 DIAGNOSIS — J189 Pneumonia, unspecified organism: Secondary | ICD-10-CM | POA: Diagnosis not present

## 2022-01-08 DIAGNOSIS — R69 Illness, unspecified: Secondary | ICD-10-CM | POA: Diagnosis not present

## 2022-01-08 DIAGNOSIS — N184 Chronic kidney disease, stage 4 (severe): Secondary | ICD-10-CM | POA: Diagnosis not present

## 2022-01-08 DIAGNOSIS — J9611 Chronic respiratory failure with hypoxia: Secondary | ICD-10-CM | POA: Diagnosis not present

## 2022-01-08 DIAGNOSIS — J811 Chronic pulmonary edema: Secondary | ICD-10-CM | POA: Diagnosis not present

## 2022-01-08 DIAGNOSIS — E1122 Type 2 diabetes mellitus with diabetic chronic kidney disease: Secondary | ICD-10-CM | POA: Diagnosis not present

## 2022-01-08 DIAGNOSIS — A419 Sepsis, unspecified organism: Secondary | ICD-10-CM | POA: Diagnosis not present

## 2022-01-08 DIAGNOSIS — N185 Chronic kidney disease, stage 5: Secondary | ICD-10-CM | POA: Diagnosis not present

## 2022-01-08 DIAGNOSIS — Z8719 Personal history of other diseases of the digestive system: Secondary | ICD-10-CM | POA: Diagnosis not present

## 2022-01-08 DIAGNOSIS — Z91199 Patient's noncompliance with other medical treatment and regimen due to unspecified reason: Secondary | ICD-10-CM | POA: Diagnosis not present

## 2022-01-08 DIAGNOSIS — J449 Chronic obstructive pulmonary disease, unspecified: Secondary | ICD-10-CM | POA: Diagnosis not present

## 2022-01-08 DIAGNOSIS — I272 Pulmonary hypertension, unspecified: Secondary | ICD-10-CM | POA: Diagnosis not present

## 2022-01-08 DIAGNOSIS — D649 Anemia, unspecified: Secondary | ICD-10-CM | POA: Diagnosis not present

## 2022-01-08 DIAGNOSIS — R6520 Severe sepsis without septic shock: Secondary | ICD-10-CM | POA: Diagnosis not present

## 2022-01-08 DIAGNOSIS — I130 Hypertensive heart and chronic kidney disease with heart failure and stage 1 through stage 4 chronic kidney disease, or unspecified chronic kidney disease: Secondary | ICD-10-CM | POA: Diagnosis not present

## 2022-01-08 DIAGNOSIS — J9601 Acute respiratory failure with hypoxia: Secondary | ICD-10-CM | POA: Diagnosis not present

## 2022-01-08 DIAGNOSIS — J9622 Acute and chronic respiratory failure with hypercapnia: Secondary | ICD-10-CM | POA: Diagnosis not present

## 2022-01-08 DIAGNOSIS — I5032 Chronic diastolic (congestive) heart failure: Secondary | ICD-10-CM | POA: Diagnosis not present

## 2022-01-08 DIAGNOSIS — Z7952 Long term (current) use of systemic steroids: Secondary | ICD-10-CM | POA: Diagnosis not present

## 2022-01-08 DIAGNOSIS — I5030 Unspecified diastolic (congestive) heart failure: Secondary | ICD-10-CM | POA: Diagnosis not present

## 2022-01-08 DIAGNOSIS — R531 Weakness: Secondary | ICD-10-CM | POA: Diagnosis not present

## 2022-01-08 DIAGNOSIS — E876 Hypokalemia: Secondary | ICD-10-CM | POA: Diagnosis not present

## 2022-01-08 DIAGNOSIS — R0902 Hypoxemia: Secondary | ICD-10-CM | POA: Diagnosis not present

## 2022-01-09 DIAGNOSIS — I4891 Unspecified atrial fibrillation: Secondary | ICD-10-CM | POA: Diagnosis not present

## 2022-01-09 DIAGNOSIS — J9601 Acute respiratory failure with hypoxia: Secondary | ICD-10-CM | POA: Diagnosis not present

## 2022-01-09 DIAGNOSIS — R69 Illness, unspecified: Secondary | ICD-10-CM | POA: Diagnosis not present

## 2022-01-09 DIAGNOSIS — E1122 Type 2 diabetes mellitus with diabetic chronic kidney disease: Secondary | ICD-10-CM | POA: Diagnosis not present

## 2022-01-09 DIAGNOSIS — E876 Hypokalemia: Secondary | ICD-10-CM | POA: Diagnosis not present

## 2022-01-09 DIAGNOSIS — I5030 Unspecified diastolic (congestive) heart failure: Secondary | ICD-10-CM | POA: Diagnosis not present

## 2022-01-09 DIAGNOSIS — N184 Chronic kidney disease, stage 4 (severe): Secondary | ICD-10-CM | POA: Diagnosis not present

## 2022-01-09 DIAGNOSIS — J9602 Acute respiratory failure with hypercapnia: Secondary | ICD-10-CM | POA: Diagnosis not present

## 2022-01-09 DIAGNOSIS — Z9989 Dependence on other enabling machines and devices: Secondary | ICD-10-CM | POA: Diagnosis not present

## 2022-01-09 DIAGNOSIS — R7989 Other specified abnormal findings of blood chemistry: Secondary | ICD-10-CM | POA: Diagnosis not present

## 2022-01-09 DIAGNOSIS — J449 Chronic obstructive pulmonary disease, unspecified: Secondary | ICD-10-CM | POA: Diagnosis not present

## 2022-01-10 DIAGNOSIS — R69 Illness, unspecified: Secondary | ICD-10-CM | POA: Diagnosis not present

## 2022-01-10 DIAGNOSIS — J9602 Acute respiratory failure with hypercapnia: Secondary | ICD-10-CM | POA: Diagnosis not present

## 2022-01-10 DIAGNOSIS — J90 Pleural effusion, not elsewhere classified: Secondary | ICD-10-CM | POA: Diagnosis not present

## 2022-01-10 DIAGNOSIS — J9601 Acute respiratory failure with hypoxia: Secondary | ICD-10-CM | POA: Diagnosis not present

## 2022-01-10 DIAGNOSIS — I5030 Unspecified diastolic (congestive) heart failure: Secondary | ICD-10-CM | POA: Diagnosis not present

## 2022-01-10 DIAGNOSIS — R0602 Shortness of breath: Secondary | ICD-10-CM | POA: Diagnosis not present

## 2022-01-10 DIAGNOSIS — E1122 Type 2 diabetes mellitus with diabetic chronic kidney disease: Secondary | ICD-10-CM | POA: Diagnosis not present

## 2022-01-10 DIAGNOSIS — A419 Sepsis, unspecified organism: Secondary | ICD-10-CM | POA: Diagnosis not present

## 2022-01-10 DIAGNOSIS — R7989 Other specified abnormal findings of blood chemistry: Secondary | ICD-10-CM | POA: Diagnosis not present

## 2022-01-10 DIAGNOSIS — Z9981 Dependence on supplemental oxygen: Secondary | ICD-10-CM | POA: Diagnosis not present

## 2022-01-10 DIAGNOSIS — J449 Chronic obstructive pulmonary disease, unspecified: Secondary | ICD-10-CM | POA: Diagnosis not present

## 2022-01-10 DIAGNOSIS — N184 Chronic kidney disease, stage 4 (severe): Secondary | ICD-10-CM | POA: Diagnosis not present

## 2022-01-10 DIAGNOSIS — I4891 Unspecified atrial fibrillation: Secondary | ICD-10-CM | POA: Diagnosis not present

## 2022-01-11 DIAGNOSIS — I4891 Unspecified atrial fibrillation: Secondary | ICD-10-CM | POA: Diagnosis not present

## 2022-01-11 DIAGNOSIS — Z9981 Dependence on supplemental oxygen: Secondary | ICD-10-CM | POA: Diagnosis not present

## 2022-01-11 DIAGNOSIS — I5030 Unspecified diastolic (congestive) heart failure: Secondary | ICD-10-CM | POA: Diagnosis not present

## 2022-01-11 DIAGNOSIS — J9602 Acute respiratory failure with hypercapnia: Secondary | ICD-10-CM | POA: Diagnosis not present

## 2022-01-11 DIAGNOSIS — J9601 Acute respiratory failure with hypoxia: Secondary | ICD-10-CM | POA: Diagnosis not present

## 2022-01-11 DIAGNOSIS — Z8719 Personal history of other diseases of the digestive system: Secondary | ICD-10-CM | POA: Diagnosis not present

## 2022-01-11 DIAGNOSIS — E1122 Type 2 diabetes mellitus with diabetic chronic kidney disease: Secondary | ICD-10-CM | POA: Diagnosis not present

## 2022-01-11 DIAGNOSIS — R69 Illness, unspecified: Secondary | ICD-10-CM | POA: Diagnosis not present

## 2022-01-11 DIAGNOSIS — R7989 Other specified abnormal findings of blood chemistry: Secondary | ICD-10-CM | POA: Diagnosis not present

## 2022-01-11 DIAGNOSIS — Z79899 Other long term (current) drug therapy: Secondary | ICD-10-CM | POA: Diagnosis not present

## 2022-01-11 DIAGNOSIS — N184 Chronic kidney disease, stage 4 (severe): Secondary | ICD-10-CM | POA: Diagnosis not present

## 2022-01-11 DIAGNOSIS — J449 Chronic obstructive pulmonary disease, unspecified: Secondary | ICD-10-CM | POA: Diagnosis not present

## 2022-01-12 DIAGNOSIS — J9601 Acute respiratory failure with hypoxia: Secondary | ICD-10-CM | POA: Diagnosis not present

## 2022-01-12 DIAGNOSIS — I5030 Unspecified diastolic (congestive) heart failure: Secondary | ICD-10-CM | POA: Diagnosis not present

## 2022-01-12 DIAGNOSIS — J449 Chronic obstructive pulmonary disease, unspecified: Secondary | ICD-10-CM | POA: Diagnosis not present

## 2022-01-12 DIAGNOSIS — R7989 Other specified abnormal findings of blood chemistry: Secondary | ICD-10-CM | POA: Diagnosis not present

## 2022-01-12 DIAGNOSIS — R69 Illness, unspecified: Secondary | ICD-10-CM | POA: Diagnosis not present

## 2022-01-12 DIAGNOSIS — J9611 Chronic respiratory failure with hypoxia: Secondary | ICD-10-CM | POA: Diagnosis not present

## 2022-01-12 DIAGNOSIS — J9602 Acute respiratory failure with hypercapnia: Secondary | ICD-10-CM | POA: Diagnosis not present

## 2022-01-12 DIAGNOSIS — E1122 Type 2 diabetes mellitus with diabetic chronic kidney disease: Secondary | ICD-10-CM | POA: Diagnosis not present

## 2022-01-12 DIAGNOSIS — J441 Chronic obstructive pulmonary disease with (acute) exacerbation: Secondary | ICD-10-CM | POA: Diagnosis not present

## 2022-01-12 DIAGNOSIS — I4891 Unspecified atrial fibrillation: Secondary | ICD-10-CM | POA: Diagnosis not present

## 2022-01-12 DIAGNOSIS — Z9981 Dependence on supplemental oxygen: Secondary | ICD-10-CM | POA: Diagnosis not present

## 2022-01-12 DIAGNOSIS — N184 Chronic kidney disease, stage 4 (severe): Secondary | ICD-10-CM | POA: Diagnosis not present

## 2022-01-12 DIAGNOSIS — Z9989 Dependence on other enabling machines and devices: Secondary | ICD-10-CM | POA: Diagnosis not present

## 2022-01-12 DIAGNOSIS — Z79899 Other long term (current) drug therapy: Secondary | ICD-10-CM | POA: Diagnosis not present

## 2022-01-13 DIAGNOSIS — R5381 Other malaise: Secondary | ICD-10-CM | POA: Diagnosis not present

## 2022-01-13 DIAGNOSIS — N184 Chronic kidney disease, stage 4 (severe): Secondary | ICD-10-CM | POA: Diagnosis not present

## 2022-01-13 DIAGNOSIS — I5030 Unspecified diastolic (congestive) heart failure: Secondary | ICD-10-CM | POA: Diagnosis not present

## 2022-01-13 DIAGNOSIS — R69 Illness, unspecified: Secondary | ICD-10-CM | POA: Diagnosis not present

## 2022-01-13 DIAGNOSIS — J9601 Acute respiratory failure with hypoxia: Secondary | ICD-10-CM | POA: Diagnosis not present

## 2022-01-13 DIAGNOSIS — R279 Unspecified lack of coordination: Secondary | ICD-10-CM | POA: Diagnosis not present

## 2022-01-13 DIAGNOSIS — R54 Age-related physical debility: Secondary | ICD-10-CM | POA: Diagnosis not present

## 2022-01-13 DIAGNOSIS — Z743 Need for continuous supervision: Secondary | ICD-10-CM | POA: Diagnosis not present

## 2022-01-13 DIAGNOSIS — E1122 Type 2 diabetes mellitus with diabetic chronic kidney disease: Secondary | ICD-10-CM | POA: Diagnosis not present

## 2022-01-13 DIAGNOSIS — R7989 Other specified abnormal findings of blood chemistry: Secondary | ICD-10-CM | POA: Diagnosis not present

## 2022-01-13 DIAGNOSIS — I4891 Unspecified atrial fibrillation: Secondary | ICD-10-CM | POA: Diagnosis not present

## 2022-01-13 DIAGNOSIS — J9602 Acute respiratory failure with hypercapnia: Secondary | ICD-10-CM | POA: Diagnosis not present

## 2022-01-13 DIAGNOSIS — I4821 Permanent atrial fibrillation: Secondary | ICD-10-CM | POA: Diagnosis not present

## 2022-01-13 DIAGNOSIS — J441 Chronic obstructive pulmonary disease with (acute) exacerbation: Secondary | ICD-10-CM | POA: Diagnosis not present

## 2022-01-14 ENCOUNTER — Encounter: Payer: Self-pay | Admitting: Licensed Clinical Social Worker

## 2022-01-14 ENCOUNTER — Ambulatory Visit: Payer: Self-pay | Admitting: Licensed Clinical Social Worker

## 2022-01-14 NOTE — Patient Outreach (Signed)
  Care Coordination   01/14/2022 Name: Donald Gaines MRN: [id number] DOB: October 05, 1940   Care Coordination Outreach Attempts:  A second unsuccessful outreach was attempted today to offer the patient with information about available care coordination services as a benefit of their health plan.     Follow Up Plan:  Additional outreach attempts will be made to offer the patient care coordination information and services.   Encounter Outcome:  No Answer  Care Coordination Interventions Activated:  No   Care Coordination Interventions:  No, not indicated    Milus Height, BSW , MSW, [place] Social Worker [facility] Care Management  [phone]

## 2022-01-15 ENCOUNTER — Encounter (HOSPITAL_COMMUNITY)
Admission: RE | Admit: 2022-01-15 | Discharge: 2022-01-15 | Disposition: A | Discharge status: Home / Self Care | Payer: Medicare HMO | Source: Ambulatory Visit | Attending: Gastroenterology | Admitting: Gastroenterology

## 2022-01-19 ENCOUNTER — Telehealth: Payer: Self-pay

## 2022-01-19 DIAGNOSIS — J449 Chronic obstructive pulmonary disease, unspecified: Secondary | ICD-10-CM

## 2022-01-19 DIAGNOSIS — I5030 Unspecified diastolic (congestive) heart failure: Secondary | ICD-10-CM

## 2022-01-19 DIAGNOSIS — I10 Essential (primary) hypertension: Secondary | ICD-10-CM

## 2022-01-19 NOTE — Patient Outreach (Signed)
Received a referral from [place] for [name] for Social Work.   I have sent a social work referral to the [place] [place] [place] [facility] [phone]

## 2022-01-20 ENCOUNTER — Ambulatory Visit (HOSPITAL_COMMUNITY): Admission: RE | Admit: 2022-01-20 | Payer: Medicare HMO | Source: Home / Self Care | Admitting: Gastroenterology

## 2022-01-20 ENCOUNTER — Encounter (HOSPITAL_COMMUNITY): Admission: RE | Payer: Self-pay | Source: Home / Self Care

## 2022-01-20 SURGERY — ESOPHAGOGASTRODUODENOSCOPY (EGD) WITH PROPOFOL
Anesthesia: Monitor Anesthesia Care

## 2022-01-21 ENCOUNTER — Telehealth (INDEPENDENT_AMBULATORY_CARE_PROVIDER_SITE_OTHER): Payer: Self-pay | Admitting: *Deleted

## 2022-01-21 ENCOUNTER — Telehealth: Payer: Self-pay | Admitting: *Deleted

## 2022-01-21 NOTE — Telephone Encounter (Signed)
Discussed with nurse Kenney Houseman and she took down appt information and told me she would set up rcats to bring him nov7 th at 3:15 to office on [address]. He is also seeing pcp on Friday. She told me patient does already have pulmonogist and I let her know he needs to follow up with lung specialist. She verbalized understanding.

## 2022-01-21 NOTE — Telephone Encounter (Signed)
Tonya RN with [facility] is seeing patient at home every week. He was scheduled for EGD yesterday that was canceled due to his recent hospitalization. Reports not having an active bleed that she can see but having sob, fatigue, and looking more pale. Last hg in epic was 8.2 on 01/13/22. Reports his O2 was between 88 - 90. He has oxygen but does not like to wear during the day. Uses at night and also has noninvasive ventilator that She reports he declines to use. He is taking ferrous sulfate 325 mg daily. She reported to his pcp Dr. Delphina Cahill and he wanted her to call here to see if there was anything patient could do til he could get rescheduled for EGD. He has appt with chelsea on 12/14 for hospital follow up. He does use [facility] for appts and [facility] requires 3 day notice for appts.   Tonya RN with [facility] [phone].

## 2022-01-21 NOTE — Telephone Encounter (Signed)
We will need him to be seen in the office and also to have optimization of his lungs, as it seems he is not doing very well from the respiratory standpoint (more short of breath and with borderline saturations). He may need to be seen by a pulmonologist - PCP may want to refer him to this practice if he does not feel comfortable optimizing the lung disease. Please let Tonya RN know about this.

## 2022-01-21 NOTE — Telephone Encounter (Signed)
Patient was scheduled for November 7th at 3:30 at [address] office. Pt's sister is aware of appt. Left message to return call with [facility] RN with [facility].

## 2022-01-21 NOTE — Telephone Encounter (Signed)
Donald Gaines called back because [facility] does not do appts after 3 and he was rescheduled for Wednesday at 11 with anna at office on [address]. Appt info and address given to tonya for her to call [facility].

## 2022-01-21 NOTE — Chronic Care Management (AMB) (Signed)
  Care Coordination  Outreach Note  01/21/2022 Name: Donald Gaines MRN: [id number] DOB: November 03, 1940   Care Coordination Outreach Attempts: An unsuccessful telephone outreach was attempted today to offer the patient information about available care coordination services as a benefit of their health plan.   Follow Up Plan:  Additional outreach attempts will be made to offer the patient care coordination information and services.   Encounter Outcome:  No Answer  [place]  Direct Dial: [phone]

## 2022-01-22 NOTE — Chronic Care Management (AMB) (Signed)
  Care Coordination  Outreach Note  01/22/2022 Name: Donald Gaines MRN: [id number] DOB: 1941-02-17   Care Coordination Outreach Attempts: A second unsuccessful outreach was attempted today to offer the patient with information about available care coordination services as a benefit of their health plan.     Follow Up Plan:  Additional outreach attempts will be made to offer the patient care coordination information and services.   Encounter Outcome:  No Answer  [place]  Direct Dial: [phone]

## 2022-01-23 ENCOUNTER — Telehealth: Payer: Self-pay | Admitting: *Deleted

## 2022-01-23 NOTE — Progress Notes (Signed)
  Care Coordination   Note   01/23/2022 Name: TYSHEEM ACCARDO MRN: [id number] DOB: [birth year]  DARIYON URQUILLA is a 81 y.o. year old male who sees [place] Crane, Edwinna Areola, MD for primary care. I reached out to Shyrl Numbers by phone today to offer care coordination services.  Mr. Geraldo was given information about Care Coordination services today including:   The Care Coordination services include support from the care team which includes your Nurse Coordinator, Clinical Social Worker, or Pharmacist.  The Care Coordination team is here to help remove barriers to the health concerns and goals most important to you. Care Coordination services are voluntary, and the patient may decline or stop services at any time by request to their care team member.   Care Coordination Consent Status: Patient sister [name] agreed to services and verbal consent obtained.   Follow up plan:  Telephone appointment with care coordination team member scheduled for:  01/26/22  Encounter Outcome:  Pt. Scheduled  [place]  Direct Dial: [phone]

## 2022-01-23 NOTE — Progress Notes (Signed)
  Care Coordination  Outreach Note  01/23/2022 Name: Donald Gaines MRN: [id number] DOB: 20-Nov-1940   Care Coordination Outreach Attempts: A third unsuccessful outreach was attempted today to offer the patient with information about available care coordination services as a benefit of their health plan.   Follow Up Plan:  No further outreach attempts will be made at this time. We have been unable to contact the patient to offer or enroll patient in care coordination services  Encounter Outcome:  No Answer  [place]: [phone]

## 2022-01-26 ENCOUNTER — Encounter: Payer: Self-pay | Admitting: *Deleted

## 2022-01-26 ENCOUNTER — Ambulatory Visit: Payer: Self-pay | Admitting: *Deleted

## 2022-01-26 NOTE — Patient Outreach (Signed)
  Care Coordination   Initial Visit Note   01/26/2022  Name: Donald Gaines MRN: [id number] DOB: [birth year]  Donald Gaines is a 81 y.o. year old male who sees [place] Crane, Edwinna Areola, MD for primary care. I spoke with Shyrl Numbers and sister, [name] by phone today.  What matters to the patients health and wellness today?  Find Help in My Community.    Goals Addressed             This Visit's Progress    Find Help in My Community.   On track    Care Coordination Interventions:  Assessed Social Determinant of Health Barriers. Discussed Plans for Ongoing Care Management Follow Up. Provided Tree surgeon Information for Care Management Team. Screened for Signs & Symptoms of Depression Related to Chronic Disease State.  FXO3/ANV9 Depression Screen Completed & Results Reviewed.  Suicidal Ideation/Homicidal Ideation Assessed - None Present. Solution-Focused Strategies Employed. Active Listening/Reflection Utilized.  Emotional Support Provided. Verbalization of Feelings Encouraged.  Caregiver Stress Acknowledged. Caregiver Support Provided. Caregiver Resources Reviewed. Problem Solving/Task-Centered Solutions Developed.   Psychoeducation for Mental Health Concerns Initiated. Reviewed Prescription Medications & Discussed Importance of Compliance. Quality of Sleep Assessed & Sleep Hygiene Techniques Promoted. Review List of Cleaning & De-cluttering Agencies, Mailed on 01/26/2022. Review Personal Care Services Instructions, Application and Agency Provider List, Mailed on 01/26/2022, and Be Prepared to Complete Application During Next Scheduled Telephone Outreach Call.         SDOH assessments and interventions completed:  Yes.  SDOH Interventions Today    Flowsheet Row Most Recent Value  SDOH Interventions   Food Insecurity Interventions Intervention Not Indicated, Other (Comment)  [Verified by Sister - [place] Interventions Intervention Not Indicated, Other  (Comment)  [Verified by [place] Pugh]  Transportation Interventions Intervention Not Indicated, Other (Comment)  [Verified by [place] Pugh]  Utilities Interventions Intervention Not Indicated, Other (Comment)  [Verified by [place]ugh]  Alcohol Usage Interventions Intervention Not Indicated (Score <7), Other (Comment)  [Verified by [place]]  Financial Strain Interventions Intervention Not Indicated, Other (Comment)  [Verified by [place]]  Physical Activity Interventions Patient Refused, Other (Comments)  [Verified by [place] Pugh]  Stress Interventions Intervention Not Indicated, Other (Comment)  [Verified by [place] Pugh]  Social Connections Interventions Intervention Not Indicated, Other (Comment)  [Verified by [place] Pugh]     Care Coordination Interventions Activated:  Yes.   Care Coordination Interventions:  Yes, provided.   Follow up plan: Follow up call scheduled for 02/09/2022 at 11:15 am.  Encounter Outcome:  Pt. Visit Completed.   Nat Christen, BSW, MSW, LCSW  Licensed Education officer, [facility]  Mailing [place] [address] [place], [place] [address] Physical Address-[address], [place], [place] [address] Toll Free Main # [phone] Fax # [phone] Cell # [phone] [email or web address]

## 2022-01-26 NOTE — Patient Instructions (Signed)
Visit Information  Thank you for taking time to visit with me today. Please don't hesitate to contact me if I can be of assistance to you.   Following are the goals we discussed today:   Goals Addressed             This Visit's Progress    Find Help in My Community.   On track    Care Coordination Interventions:  Assessed Social Determinant of Health Barriers. Discussed Plans for Ongoing Care Management Follow Up. Provided Tree surgeon Information for Care Management Team. Screened for Signs & Symptoms of Depression Related to Chronic Disease State.  OEU2/PNT6 Depression Screen Completed & Results Reviewed.  Suicidal Ideation/Homicidal Ideation Assessed - None Present. Solution-Focused Strategies Employed. Active Listening/Reflection Utilized.  Emotional Support Provided. Verbalization of Feelings Encouraged.  Caregiver Stress Acknowledged. Caregiver Support Provided. Caregiver Resources Reviewed. Problem Solving/Task-Centered Solutions Developed.   Psychoeducation for Mental Health Concerns Initiated. Reviewed Prescription Medications & Discussed Importance of Compliance. Quality of Sleep Assessed & Sleep Hygiene Techniques Promoted. Review List of Cleaning & De-cluttering Agencies, Mailed on 01/26/2022. Review Personal Care Services Instructions, Application and Agency Provider List, Mailed on 01/26/2022, and Be Prepared to Complete Application During Next Scheduled Telephone Outreach Call.       Our next appointment is by telephone on 02/09/2022 at 11:15 am.  Please call the care guide team at [phone] if you need to cancel or reschedule your appointment.   If you are experiencing a Mental Health or [place] or need someone to talk to, please call the Suicide and Crisis Lifeline: 988 call the [place] National Suicide Prevention Lifeline: 669-671-8060 or TTY: 914-674-3578 TTY (220)265-7286) to talk to a trained counselor call 1-800-273-TALK (toll  free, 24 hour hotline) go to [facility] [address] [place] [phone]) call the [place]: [phone] call 911  Patient verbalizes understanding of instructions and care plan provided today and agrees to view in [place]. Active MyChart status and patient understanding of how to access instructions and care plan via MyChart confirmed with patient.     Telephone follow up appointment with care management team member scheduled for:  02/09/2022 at 11:15 am.  Nat Christen, BSW, MSW, [place]  Licensed Clinical Social Worker  [place]  Mailing [place]. [address] [place], [place] [address] Physical Address-[address] [place], [place] [address] Toll Free Main # [phone] Fax # [phone] Cell # [phone] [email or web address]

## 2022-01-27 ENCOUNTER — Ambulatory Visit: Payer: Medicare HMO | Admitting: Gastroenterology

## 2022-01-28 ENCOUNTER — Ambulatory Visit: Payer: Medicare HMO | Admitting: Gastroenterology

## 2022-01-28 NOTE — Progress Notes (Deleted)
PMH significant for h/o IDA due to chronic GI blood loss in setting of gastric and small bowel AVMS, COPD, DM, HTN, TIA, Afib not anticoagulated due to recurrent GI bleeding, CAD, recently inpatient Aug 2023 with symptomatic anemia and melena. Received blood transfusion while inpatient.  EGD performed while inpatient with one 10 mm pedunculated polyp without bleeding in second portion of duodenum but not removed. EGD as outpatient OFF Plavix recommend to remove duodenal polyp. Capsule study with 4 small non-bleeding AVMS in proximal small bowel. Push enteroscopy with multiple non-bleeding AVMs in duodenum s/p APC and clip placement.

## 2022-02-05 DIAGNOSIS — J449 Chronic obstructive pulmonary disease, unspecified: Secondary | ICD-10-CM | POA: Diagnosis not present

## 2022-02-09 ENCOUNTER — Ambulatory Visit: Payer: Self-pay | Admitting: *Deleted

## 2022-02-09 ENCOUNTER — Encounter: Payer: Self-pay | Admitting: *Deleted

## 2022-02-09 NOTE — Patient Outreach (Signed)
  Care Coordination   Follow Up Visit Note   02/09/2022  Name: Donald Gaines MRN: [id number] DOB: 26-Oct-1940  Donald Gaines is a 81 y.o. year old male who sees Nevada Crane, [place]na Areola, MD for primary care. I spoke with Shyrl Numbers and sister, [name] by phone today.  What matters to the patients health and wellness today?   Find Help in My Community.   Goals Addressed             This Visit's Progress    Find Help in My Community.   On track    Care Coordination Interventions:  Active Listening Utilized.  Verbalization of Feelings Encouraged.  Caregiver Stress Acknowledged. Caregiver and Emotional Support Provided. Editor, commissioning. Task-Centered Solutions Employed.   Hospice & Palliative Care Services Reviewed. ~ Keep Initial Home Visit with Representative from [place], Scheduled on 02/09/2022 at 2:00 pm. Reviewed List of Cleaning & De-Cluttering Agencies. ~ Encouraged to [place] De-Cluttering Agencies of Interest, in An Effort to [employer] in the Home. Reviewed Personal Care Services Instructions, Application and Agency Provider List, and Attempted to Assist with Application Completion and Submission.  ~ Declined Need for [place], through [facility].       SDOH assessments and interventions completed:  Yes.  Care Coordination Interventions Activated:  Yes.   Care Coordination Interventions:  Yes, provided.   Follow up plan: Follow up call scheduled for 02/23/2022 at 10:30 am.  Encounter Outcome:  Pt. Visit Completed.   Nat Christen, BSW, MSW, LCSW  Licensed Education officer, environmental Health System  Mailing [place] [address]. [address] [place], [place] [address] Physical Address-[address] [place], [place] [address] Toll Free Main # [phone] Fax # [phone] Cell # [phone] [email or web address]

## 2022-02-09 NOTE — Patient Instructions (Signed)
Visit Information  Thank you for taking time to visit with me today. Please don't hesitate to contact me if I can be of assistance to you.   Following are the goals we discussed today:   Goals Addressed             This Visit's Progress    Find Help in My Community.   On track    Care Coordination Interventions:  Active Listening Utilized.  Verbalization of Feelings Encouraged.  Caregiver Stress Acknowledged. Caregiver and Emotional Support Provided. Editor, commissioning. Task-Centered Solutions Employed.   Hospice & Palliative Care Services Reviewed. ~ Keep Initial Home Visit with Representative from [place], Scheduled on 02/09/2022 at 2:00 pm. Reviewed List of Cleaning & De-Cluttering Agencies. ~ Encouraged to [place] De-Cluttering Agencies of Interest, in An Effort to [employer] in the Home. Reviewed Personal Care Services Instructions, Application and Agency Provider List, and Attempted to Assist with Application Completion and Submission.  ~ Declined Need for [place], through [facility].       Our next appointment is by telephone on 02/23/2022 at 10:30 am.  Please call the care guide team at [phone] if you need to cancel or reschedule your appointment.   If you are experiencing a Mental Health or [place] or need someone to talk to, please call the Suicide and Crisis Lifeline: 988 call the [place] National Suicide Prevention Lifeline: 360-530-4576 or TTY: 218-111-0152 TTY (651)380-6718) to talk to a trained counselor call 1-800-273-TALK (toll free, 24 hour hotline) go to [facility] [address], [place] [phone]) call the [place]: [phone] call 911  Patient verbalizes understanding of instructions and care plan provided today and agrees to view in [place]. Active MyChart status and patient  understanding of how to access instructions and care plan via MyChart confirmed with patient.     Telephone follow up appointment with care management team member scheduled for:  02/23/2022 at 10:30 am.  Nat Christen, BSW, MSW, [place]  Licensed Clinical Social Worker  [place]  Mailing [place]. [address] [place], [place] [address] Physical Address-[address], [place], [place] [address] Toll Free Main # [phone] Fax # [phone] Cell # [phone] [email or web address]

## 2022-02-10 DIAGNOSIS — Z515 Encounter for palliative care: Secondary | ICD-10-CM | POA: Diagnosis not present

## 2022-02-10 DIAGNOSIS — J9611 Chronic respiratory failure with hypoxia: Secondary | ICD-10-CM | POA: Diagnosis not present

## 2022-02-10 DIAGNOSIS — Z720 Tobacco use: Secondary | ICD-10-CM | POA: Diagnosis not present

## 2022-02-10 DIAGNOSIS — I509 Heart failure, unspecified: Secondary | ICD-10-CM | POA: Diagnosis not present

## 2022-02-10 DIAGNOSIS — K922 Gastrointestinal hemorrhage, unspecified: Secondary | ICD-10-CM | POA: Diagnosis not present

## 2022-02-10 DIAGNOSIS — Z9981 Dependence on supplemental oxygen: Secondary | ICD-10-CM | POA: Diagnosis not present

## 2022-02-10 DIAGNOSIS — J449 Chronic obstructive pulmonary disease, unspecified: Secondary | ICD-10-CM | POA: Diagnosis not present

## 2022-02-10 DIAGNOSIS — I4821 Permanent atrial fibrillation: Secondary | ICD-10-CM | POA: Diagnosis not present

## 2022-02-12 DIAGNOSIS — J449 Chronic obstructive pulmonary disease, unspecified: Secondary | ICD-10-CM | POA: Diagnosis not present

## 2022-02-12 DIAGNOSIS — J9611 Chronic respiratory failure with hypoxia: Secondary | ICD-10-CM | POA: Diagnosis not present

## 2022-02-23 ENCOUNTER — Encounter: Payer: Self-pay | Admitting: *Deleted

## 2022-02-23 ENCOUNTER — Telehealth: Payer: Self-pay | Admitting: Cardiology

## 2022-02-23 ENCOUNTER — Ambulatory Visit: Payer: Self-pay | Admitting: *Deleted

## 2022-02-23 NOTE — Telephone Encounter (Signed)
  Patient Consent for Virtual Visit        Donald Gaines has provided verbal consent on 02/23/2022 for a virtual visit (video or telephone).   CONSENT FOR VIRTUAL VISIT FOR:  Donald Gaines  By participating in this virtual visit I agree to the following:  I hereby voluntarily request, consent and authorize [facility] and its employed or contracted physicians, physician assistants, nurse practitioners or other licensed health care professionals (the Practitioner), to provide me with telemedicine health care services (the "Services") as deemed necessary by the treating Practitioner. I acknowledge and consent to receive the Services by the Practitioner via telemedicine. I understand that the telemedicine visit will involve communicating with the Practitioner through live audiovisual communication technology and the disclosure of certain medical information by electronic transmission. I acknowledge that I have been given the opportunity to request an in-person assessment or other available alternative prior to the telemedicine visit and am voluntarily participating in the telemedicine visit.  I understand that I have the right to withhold or withdraw my consent to the use of telemedicine in the course of my care at any time, without affecting my right to future care or treatment, and that the Practitioner or I may terminate the telemedicine visit at any time. I understand that I have the right to inspect all information obtained and/or recorded in the course of the telemedicine visit and may receive copies of available information for a reasonable fee.  I understand that some of the potential risks of receiving the Services via telemedicine include:  Delay or interruption in medical evaluation due to technological equipment failure or disruption; Information transmitted may not be sufficient (e.g. poor resolution of images) to allow for appropriate medical decision making by the  Practitioner; and/or  In rare instances, security protocols could fail, causing a breach of personal health information.  Furthermore, I acknowledge that it is my responsibility to provide information about my medical history, conditions and care that is complete and accurate to the best of my ability. I acknowledge that Practitioner's advice, recommendations, and/or decision may be based on factors not within their control, such as incomplete or inaccurate data provided by me or distortions of diagnostic images or specimens that may result from electronic transmissions. I understand that the practice of medicine is not an exact science and that Practitioner makes no warranties or guarantees regarding treatment outcomes. I acknowledge that a copy of this consent can be made available to me via my patient portal ([facility]), or I can request a printed copy by calling the office of [facility].    I understand that my insurance will be billed for this visit.   I have read or had this consent read to me. I understand the contents of this consent, which adequately explains the benefits and risks of the Services being provided via telemedicine.  I have been provided ample opportunity to ask questions regarding this consent and the Services and have had my questions answered to my satisfaction. I give my informed consent for the services to be provided through the use of telemedicine in my medical care

## 2022-02-23 NOTE — Patient Outreach (Signed)
  Care Coordination   Follow Up Visit Note   02/23/2022  Name: Donald Gaines MRN: [id number] DOB: 1940-04-21  Donald Gaines is a 81 y.o. year old male who sees Donald Gaines, Donald Areola, MD for primary care. I spoke with Donald Gaines and sister, Donald Gaines by phone today.  What matters to the patients health and wellness today?   Find Help in My Community.   Goals Addressed             This Visit's Progress    COMPLETED: Find Help in My Community.   On track    Care Coordination Interventions:  Active Listening/Reflection Utilized.  Verbalization of Feelings Encouraged.  Emotional Support Provided. Caregiver Stress Acknowledged. Caregiver Support Implemented. Task-Centered Interventions Activated. Solution-Focused Strategies Employed.   Continue to Receive a [place] and Education officer, museum, through [facility].   Please Contact CSW Directly (# [phone]), If You Change Your Mind About Wanting to Receive Services, or If Additional Social Work Needs Are Identified in The Near Future.       SDOH assessments and interventions completed:  Yes.  Care Coordination Interventions:  Yes, provided.   Follow up plan: No further intervention required.   Encounter Outcome:  Pt. Visit Completed.   Nat Christen, BSW, MSW, LCSW  Licensed Education officer, environmental Health System  Mailing [place] [address], [place], [place] [address] Physical Address-[address], [place], [place] [address] Toll Free Main # [phone] Fax # [phone] Cell # [phone] [email or web address]

## 2022-02-23 NOTE — Patient Instructions (Signed)
Visit Information  Thank you for taking time to visit with me today. Please don't hesitate to contact me if I can be of assistance to you.   Following are the goals we discussed today:   Goals Addressed             This Visit's Progress    COMPLETED: Find Help in My Community.   On track    Care Coordination Interventions:  Active Listening/Reflection Utilized.  Verbalization of Feelings Encouraged.  Emotional Support Provided. Caregiver Stress Acknowledged. Caregiver Support Implemented. Task-Centered Interventions Activated. Solution-Focused Strategies Employed.   Continue to Receive a [place] and Education officer, museum, through [facility] [place].   Please Contact CSW Directly (# [phone]), If You Change Your Mind About Wanting to Receive Services, or If Additional Social Work Needs Are Identified in The Near Future.       Please call the care guide team at [phone] if you need to cancel or reschedule your appointment.   If you are experiencing a Mental Health or [place] or need someone to talk to, please call the Suicide and Crisis Lifeline: 988 call the [place] National Suicide Prevention Lifeline: 775-827-5706 or TTY: 9157879236 TTY 248-246-4632) to talk to a trained counselor call 1-800-273-TALK (toll free, 24 hour hotline) go to [facility] [address] [place] [phone]) call the [place]: [phone] call 911  Patient verbalizes understanding of instructions and care plan provided today and agrees to view in [place]. Active MyChart status and patient understanding of how to access instructions and care plan via MyChart confirmed with patient.     No further follow up required.  Nat Christen, BSW, MSW, LCSW  Licensed Education officer, environmental Health System  Mailing  [place] [address]. [address] [place], [place] [address] Physical Address-[address] [place], [place] [address] Toll Free Main # [phone] Fax # [phone] Cell # [phone] [email or web address]

## 2022-02-24 ENCOUNTER — Ambulatory Visit: Payer: Medicare HMO | Attending: Cardiology | Admitting: Cardiology

## 2022-02-24 ENCOUNTER — Encounter: Payer: Self-pay | Admitting: Cardiology

## 2022-02-24 VITALS — Ht 65.0 in | Wt 170.0 lb

## 2022-02-24 DIAGNOSIS — I4821 Permanent atrial fibrillation: Secondary | ICD-10-CM

## 2022-02-24 DIAGNOSIS — I25119 Atherosclerotic heart disease of native coronary artery with unspecified angina pectoris: Secondary | ICD-10-CM

## 2022-02-24 DIAGNOSIS — N184 Chronic kidney disease, stage 4 (severe): Secondary | ICD-10-CM

## 2022-02-24 NOTE — Patient Instructions (Addendum)

## 2022-02-24 NOTE — Progress Notes (Signed)
Virtual Visit via Telephone Note   Because of Donald Gaines's co-morbid illnesses, he is at least at moderate risk for complications without adequate follow up.  This format is felt to be most appropriate for this patient at this time.  The patient did not have access to video technology/had technical difficulties with video requiring transitioning to audio format only (telephone).  All issues noted in this document were discussed and addressed.  No physical exam could be performed with this format.  Please refer to the patient's chart for his consent to telehealth for [facility].    Date:  02/24/2022   ID:  Donald Gaines, DOB January 23, 1941, MRN [id number] The patient was identified using 2 identifiers.  Patient Location: Home Provider Location: Office/Clinic   PCP:  Celene Squibb, [place] [facility] Cardiologist:  Rozann Lesches, MD {  Evaluation Performed:  Follow-Up Visit  Chief Complaint: Cardiac follow-up  History of Present Illness:    Donald Gaines is a 81 y.o. male last seen in August.  We spoke by phone today. Records indicate hospitalization at [facility] back in October with acute hypoxic/hypercarbic respiratory failure in the setting of known COPD with exacerbation.  Also treated for sepsis and component of HFpEF.  He was seen by Dr. Candis Musa from [facility] cardiology and had a follow-up echocardiogram as noted below.  He is back at home but tells me that there are plans underway for him to move into a nursing facility ultimately.  He reports chronic dyspnea on exertion, NYHA class II-III.  Intermittent cough that is nonproductive, no fevers or chills.  He has not seen his PCP since hospital discharge.  I reviewed his medications which are outlined below.  He did not have evidence of ACS by troponin levels.  Follow-up echocardiogram obtained at [facility] in October demonstrated LVEF greater than 55% with moderate mitral and tricuspid  regurgitation, also mildly elevated PASP.   Past Medical History:  Diagnosis Date   Arteriovenous malformation small bowel    Capsule study 3/10   Bilateral carotid artery stenosis    BPH (benign prostatic hypertrophy)    Candida esophagitis (HCC)    Cataract    Chronic anemia    Chronic diarrhea    COPD (chronic obstructive pulmonary disease) (HCC)    Coronary atherosclerosis of native coronary artery    Multivessel, BMS SVG TO RCA 2000, reportedly  "2" additional stents in interim   Dementia ([place])    Depression    Diabetes mellitus, type 2 ([place])    Essential hypertension    GERD (gastroesophageal reflux disease)    GI bleeding    Cecal ulcers, arteriovenous malformations   Glaucoma    Hemorrhoids    Hyperlipidemia    Myocardial infarction ([place])    1994   Portal hypertensive gastropathy ([place]) 11/08/2012   EGD. Dr. Laural Golden   Restrictive lung disease    Sleep apnea    TIA (transient ischemic attack)    2005   Past Surgical History:  Procedure Laterality Date   CAPSULE ENDO  03/10   cardiac catherization  04/2012   CARDIAC CATHETERIZATION  04/05/1996 [name]   EF 45%, occluded SVG to circumflex, patent SVG to D1 and PDA and patent LIMA to LAD with severe native vessel disease.   CARDIAC CATHETERIZATION  08/22/1998 [name]   Mild LM, severe LAD,severe CX, occlude RCA, patient  SVG to diatal RCA, patent vein graft to D1 and patent  LIMA to LAD.    CATARACT EXTRACTION W/PHACO Right 09/22/2012   Procedure: CATARACT EXTRACTION PHACO AND INTRAOCULAR LENS PLACEMENT (IOC);  Surgeon: Tonny Branch, MD;  Location: [facility];  Service: Ophthalmology;  Laterality: Right;  CDE: 11.43   CATARACT EXTRACTION W/PHACO Left 10/17/2012   Procedure: CATARACT EXTRACTION PHACO AND INTRAOCULAR LENS PLACEMENT (IOC);  Surgeon: Tonny Branch, MD;  Location: [facility];  Service: Ophthalmology;  Laterality: Left;  CDE: 8.06   COLONOSCOPY  APR 2008   SIMPLE ADENOMA, [place] [facility], IH   COLONOSCOPY  FEB 2008  ANEMIA. MELENA    2 LARGE ILEOCEAL ULCERS 2o to ASA, [place] T[facility], IH   COLONOSCOPY  SEP 2009 TRANSFUSION DEP ANEMIA   AC AVM-ABLATED, [place] T[facility], IH   COLONOSCOPY WITH ESOPHAGOGASTRODUODENOSCOPY (EGD) N/A 11/08/2012   Procedure: COLONOSCOPY WITH ESOPHAGOGASTRODUODENOSCOPY (EGD);  Surgeon: Rogene Houston, MD;  Location: [facility];  Service: Endoscopy;  Laterality: N/A;  250-rescheduled to 7:30 Ann to notify pt   CORONARY ANGIOPLASTY WITH STENT PLACEMENT  08/22/1998 [name]   TEC stenting of the SVG to RCA-Last seen by cardiologist in 2010.   CORONARY ARTERY BYPASS GRAFT  1995-triple bypass   LIMA-LAD, LIMA to intermediate branch, SVG to PD and second OM,   ENTEROSCOPY N/A 11/07/2021   Procedure: ENTEROSCOPY;  Surgeon: Harvel Quale, MD;  Location: [facility];  Service: Gastroenterology;  Laterality: N/A;  PUSH ENTEROSCOPY   ESOPHAGOGASTRODUODENOSCOPY  SEP 09   DUODENAL LIPOMA   ESOPHAGOGASTRODUODENOSCOPY N/A 09/27/2013   Procedure: ESOPHAGOGASTRODUODENOSCOPY (EGD) Push enteroscopy;  Surgeon: Rogene Houston, MD;  [facility];  Service: Endoscopy;  Laterality: N/A;   ESOPHAGOGASTRODUODENOSCOPY (EGD) WITH PROPOFOL N/A 11/06/2021   Procedure: ESOPHAGOGASTRODUODENOSCOPY (EGD) WITH PROPOFOL;  Surgeon: Eloise Harman, DO;  [facility];  Service: Endoscopy;  Laterality: N/A;   GIVENS CAPSULE STUDY N/A 11/24/2012   Procedure: GIVENS CAPSULE STUDY;  Surgeon: Rogene Houston, MD;  [facility];  Service: Endoscopy;  Laterality: N/A;  [place] 11/06/2021   Procedure: GIVENS CAPSULE STUDY;  Surgeon: Eloise Harman, DO;  [facility];  Service: Endoscopy;  Laterality: N/A;   HEMOSTASIS CLIP PLACEMENT  11/07/2021   Procedure: HEMOSTASIS CLIP PLACEMENT;  Surgeon: Harvel Quale, MD;  [facility];  Service: Gastroenterology;;   HOT HEMOSTASIS  09/27/2013   Procedure: HOT HEMOSTASIS (ARGON PLASMA  COAGULATION/BICAP);  Surgeon: Rogene Houston, MD;  [facility];  Service: Endoscopy;;   HOT HEMOSTASIS  11/07/2021   Procedure: HOT HEMOSTASIS (ARGON PLASMA COAGULATION/BICAP);  Surgeon: Montez Morita, Quillian Quince, MD;  Location: [facility];  Service: Gastroenterology;;   HYDROGEN BREATH TEST  2009   RIGHT INGUINAL HERNIA REPAIR     UPPER GASTROINTESTINAL ENDOSCOPY  SEP 2009   GASTRIC AVM ABLATED, NL DUO Bx     Current Meds  Medication Sig   acetaminophen (TYLENOL) 500 MG tablet Take 500 mg by mouth every 6 (six) hours as needed for headache.   albuterol (PROVENTIL) (2.5 MG/3ML) 0.083% nebulizer solution Take 3 mLs (2.5 mg total) by nebulization every 2 (two) hours as needed for wheezing or shortness of breath.   ALPRAZolam (XANAX) 0.5 MG tablet Take 0.5 mg by mouth 2 (two) times daily as needed for anxiety.   carvedilol (COREG) 12.5 MG tablet Take 12.5 mg by mouth 2 (two) times daily with a meal.    cetirizine (ZYRTEC) 10 MG tablet Take 10 mg by  mouth at bedtime.    Cholecalciferol (VITAMIN D3) 1.25 MG (50000 UT) CAPS Take 1 capsule by mouth once a week.   escitalopram (LEXAPRO) 10 MG tablet Take 10 mg by mouth daily.   ferrous sulfate 325 (65 FE) MG tablet Take 325 mg by mouth daily with breakfast.   furosemide (LASIX) 40 MG tablet Take 1 tablet (40 mg total) by mouth 2 (two) times daily.   gabapentin (NEURONTIN) 100 MG capsule Take 100 mg by mouth daily.   metFORMIN (GLUCOPHAGE) 500 MG tablet Take 500 mg by mouth daily with breakfast.   NITROSTAT 0.4 MG SL tablet Place 0.4 mg under the tongue every 5 (five) minutes as needed for chest pain.    omeprazole (PRILOSEC) 20 MG capsule Take 20 mg by mouth daily.    rosuvastatin (CRESTOR) 20 MG tablet Take 1 tablet (20 mg total) by mouth daily.   umeclidinium-vilanterol (ANORO ELLIPTA) 62.5-25 MCG/INH AEPB Inhale 1 puff into the lungs daily.     Allergies:   Penicillins   ROS:   Please see the history of present illness.     All other systems reviewed and are negative.   Prior CV studies:   The following studies were reviewed today:  Echocardiogram 01/08/2022 [facility]): Summary   1. The left ventricle is normal in size with mildly increased wall  thickness.   2. The left ventricular systolic function is normal, LVEF is visually  estimated at > 55%.    3. There is moderate mitral valve regurgitation.    4. The aortic valve is trileaflet with mildly thickened leaflets with normal  excursion.   5. The left atrium is severely dilated in size.    6. The right ventricle is normal in size, with normal systolic function.    7. There is moderate tricuspid regurgitation.    8. There is mild pulmonary hypertension.    9. The right atrium is moderately dilated in size.    10. IVC size and inspiratory change suggest elevated right atrial pressure.  (10-20 mmHg).  Labs/Other Tests and Data Reviewed:    EKG:  An ECG dated 11/05/2021 was personally reviewed today and demonstrated:  Atrial fibrillation with IVCD and repolarization abnormalities.  Recent Labs: 11/05/2021: ALT 9; B Natriuretic Peptide 736.0 11/08/2021: BUN 41; Creatinine, Ser 2.78; Hemoglobin 9.2; Magnesium 2.2; Platelets 138; Potassium 3.6; Sodium 141  October 2023: Hemoglobin 8.2, platelets 226, potassium 3.5, BUN 51, creatinine 2.81, magnesium 2.4   Wt Readings from Last 3 Encounters:  02/24/22 170 lb (77.1 kg)  01/15/22 159 lb (72.1 kg)  11/13/21 170 lb 3.2 oz (77.2 kg)        Objective:    Vital Signs:  Ht '5\' 5"'$  (1.651 m)   Wt 170 lb (77.1 kg)   BMI 28.29 kg/m     ASSESSMENT & PLAN:    1.  Multivessel CAD status post CABG and prior PCI.  No evidence of ACS during hospital stay at [facility] in October.  He reports no active angina with current level of activity.  Continue aspirin, Coreg, Crestor, and as needed nitroglycerin.  2.  Permanent atrial fibrillation with CHA2DS2-VASc score of 6.  He is not anticoagulated with  history of recurrent GI bleeding and asymptomatic in terms of palpitations.  3.  HFpEF complicated by valvular heart disease and CKD stage IV.  Recent echocardiogram demonstrated LVEF greater than 55% with mildly increased estimated PASP, moderate mitral and tricuspid regurgitation.  Last creatinine 2.81.  He remains  on Lasix.  Not optimal candidate for SGLT2 inhibitor.  4.  COPD with chronic bronchitis, longstanding tobacco abuse history.     Time:   Today, I have spent 12 minutes with the patient with telehealth technology discussing the above problems.     Medication Adjustments/Labs and Tests Ordered: Current medicines are reviewed at length with the patient today.  Concerns regarding medicines are outlined above.   Tests Ordered: No orders of the defined types were placed in this encounter.   Medication Changes: No orders of the defined types were placed in this encounter.   Follow Up:  In Person  6 months.  Signed, Rozann Lesches, MD  02/24/2022 3:15 PM    [place]

## 2022-03-05 ENCOUNTER — Ambulatory Visit (INDEPENDENT_AMBULATORY_CARE_PROVIDER_SITE_OTHER): Payer: Medicare HMO | Admitting: Gastroenterology

## 2022-03-05 DIAGNOSIS — I4821 Permanent atrial fibrillation: Secondary | ICD-10-CM | POA: Diagnosis not present

## 2022-03-05 DIAGNOSIS — Z720 Tobacco use: Secondary | ICD-10-CM | POA: Diagnosis not present

## 2022-03-05 DIAGNOSIS — J9611 Chronic respiratory failure with hypoxia: Secondary | ICD-10-CM | POA: Diagnosis not present

## 2022-03-05 DIAGNOSIS — R54 Age-related physical debility: Secondary | ICD-10-CM | POA: Diagnosis not present

## 2022-03-05 DIAGNOSIS — Z515 Encounter for palliative care: Secondary | ICD-10-CM | POA: Diagnosis not present

## 2022-03-05 DIAGNOSIS — I509 Heart failure, unspecified: Secondary | ICD-10-CM | POA: Diagnosis not present

## 2022-03-05 DIAGNOSIS — Z9981 Dependence on supplemental oxygen: Secondary | ICD-10-CM | POA: Diagnosis not present

## 2022-03-05 DIAGNOSIS — J449 Chronic obstructive pulmonary disease, unspecified: Secondary | ICD-10-CM | POA: Diagnosis not present

## 2022-03-07 DIAGNOSIS — J449 Chronic obstructive pulmonary disease, unspecified: Secondary | ICD-10-CM | POA: Diagnosis not present

## 2022-03-09 ENCOUNTER — Encounter: Payer: Self-pay | Admitting: *Deleted

## 2022-03-14 DIAGNOSIS — J9611 Chronic respiratory failure with hypoxia: Secondary | ICD-10-CM | POA: Diagnosis not present

## 2022-03-14 DIAGNOSIS — J449 Chronic obstructive pulmonary disease, unspecified: Secondary | ICD-10-CM | POA: Diagnosis not present

## 2022-03-19 DIAGNOSIS — I4821 Permanent atrial fibrillation: Secondary | ICD-10-CM | POA: Diagnosis not present

## 2022-03-25 DIAGNOSIS — M6281 Muscle weakness (generalized): Secondary | ICD-10-CM | POA: Diagnosis not present

## 2022-03-26 DIAGNOSIS — J9611 Chronic respiratory failure with hypoxia: Secondary | ICD-10-CM | POA: Diagnosis not present

## 2022-03-26 DIAGNOSIS — F331 Major depressive disorder, recurrent, moderate: Secondary | ICD-10-CM | POA: Diagnosis not present

## 2022-03-26 DIAGNOSIS — I2510 Atherosclerotic heart disease of native coronary artery without angina pectoris: Secondary | ICD-10-CM | POA: Diagnosis not present

## 2022-03-26 DIAGNOSIS — J449 Chronic obstructive pulmonary disease, unspecified: Secondary | ICD-10-CM | POA: Diagnosis not present

## 2022-03-26 DIAGNOSIS — E559 Vitamin D deficiency, unspecified: Secondary | ICD-10-CM | POA: Diagnosis not present

## 2022-03-26 DIAGNOSIS — N1832 Chronic kidney disease, stage 3b: Secondary | ICD-10-CM | POA: Diagnosis not present

## 2022-03-26 DIAGNOSIS — E118 Type 2 diabetes mellitus with unspecified complications: Secondary | ICD-10-CM | POA: Diagnosis not present

## 2022-03-26 DIAGNOSIS — I4821 Permanent atrial fibrillation: Secondary | ICD-10-CM | POA: Diagnosis not present

## 2022-03-26 DIAGNOSIS — R809 Proteinuria, unspecified: Secondary | ICD-10-CM | POA: Diagnosis not present

## 2022-03-26 DIAGNOSIS — E782 Mixed hyperlipidemia: Secondary | ICD-10-CM | POA: Diagnosis not present

## 2022-03-26 DIAGNOSIS — D631 Anemia in chronic kidney disease: Secondary | ICD-10-CM | POA: Diagnosis not present

## 2022-03-26 DIAGNOSIS — Z8719 Personal history of other diseases of the digestive system: Secondary | ICD-10-CM | POA: Diagnosis not present

## 2022-03-26 DIAGNOSIS — R419 Unspecified symptoms and signs involving cognitive functions and awareness: Secondary | ICD-10-CM | POA: Diagnosis not present

## 2022-03-27 DIAGNOSIS — M6281 Muscle weakness (generalized): Secondary | ICD-10-CM | POA: Diagnosis not present

## 2022-03-30 ENCOUNTER — Inpatient Hospital Stay (HOSPITAL_COMMUNITY): Payer: Medicare HMO

## 2022-03-30 ENCOUNTER — Other Ambulatory Visit: Payer: Self-pay

## 2022-03-30 ENCOUNTER — Emergency Department (HOSPITAL_COMMUNITY): Payer: Medicare HMO

## 2022-03-30 ENCOUNTER — Inpatient Hospital Stay (HOSPITAL_COMMUNITY)
Admission: EM | Admit: 2022-03-30 | Discharge: 2022-04-23 | DRG: 291 | Disposition: E | Payer: Medicare HMO | Source: Skilled Nursing Facility | Attending: Family Medicine | Admitting: Family Medicine

## 2022-03-30 ENCOUNTER — Encounter (HOSPITAL_COMMUNITY): Payer: Self-pay | Admitting: *Deleted

## 2022-03-30 DIAGNOSIS — I10 Essential (primary) hypertension: Secondary | ICD-10-CM | POA: Diagnosis not present

## 2022-03-30 DIAGNOSIS — I5033 Acute on chronic diastolic (congestive) heart failure: Secondary | ICD-10-CM | POA: Diagnosis present

## 2022-03-30 DIAGNOSIS — Z8719 Personal history of other diseases of the digestive system: Secondary | ICD-10-CM

## 2022-03-30 DIAGNOSIS — I25810 Atherosclerosis of coronary artery bypass graft(s) without angina pectoris: Secondary | ICD-10-CM | POA: Diagnosis not present

## 2022-03-30 DIAGNOSIS — Z7984 Long term (current) use of oral hypoglycemic drugs: Secondary | ICD-10-CM

## 2022-03-30 DIAGNOSIS — I700 Atherosclerosis of aorta: Secondary | ICD-10-CM | POA: Diagnosis not present

## 2022-03-30 DIAGNOSIS — R0600 Dyspnea, unspecified: Secondary | ICD-10-CM | POA: Diagnosis not present

## 2022-03-30 DIAGNOSIS — Z7189 Other specified counseling: Secondary | ICD-10-CM | POA: Diagnosis not present

## 2022-03-30 DIAGNOSIS — I252 Old myocardial infarction: Secondary | ICD-10-CM

## 2022-03-30 DIAGNOSIS — I5030 Unspecified diastolic (congestive) heart failure: Secondary | ICD-10-CM

## 2022-03-30 DIAGNOSIS — J90 Pleural effusion, not elsewhere classified: Secondary | ICD-10-CM | POA: Diagnosis present

## 2022-03-30 DIAGNOSIS — I2510 Atherosclerotic heart disease of native coronary artery without angina pectoris: Secondary | ICD-10-CM | POA: Diagnosis present

## 2022-03-30 DIAGNOSIS — K3189 Other diseases of stomach and duodenum: Secondary | ICD-10-CM | POA: Diagnosis present

## 2022-03-30 DIAGNOSIS — Z9842 Cataract extraction status, left eye: Secondary | ICD-10-CM

## 2022-03-30 DIAGNOSIS — I130 Hypertensive heart and chronic kidney disease with heart failure and stage 1 through stage 4 chronic kidney disease, or unspecified chronic kidney disease: Secondary | ICD-10-CM | POA: Diagnosis not present

## 2022-03-30 DIAGNOSIS — K922 Gastrointestinal hemorrhage, unspecified: Secondary | ICD-10-CM | POA: Diagnosis not present

## 2022-03-30 DIAGNOSIS — E1122 Type 2 diabetes mellitus with diabetic chronic kidney disease: Secondary | ICD-10-CM | POA: Diagnosis present

## 2022-03-30 DIAGNOSIS — Z743 Need for continuous supervision: Secondary | ICD-10-CM | POA: Diagnosis not present

## 2022-03-30 DIAGNOSIS — J440 Chronic obstructive pulmonary disease with acute lower respiratory infection: Secondary | ICD-10-CM | POA: Diagnosis present

## 2022-03-30 DIAGNOSIS — Z955 Presence of coronary angioplasty implant and graft: Secondary | ICD-10-CM

## 2022-03-30 DIAGNOSIS — K5521 Angiodysplasia of colon with hemorrhage: Secondary | ICD-10-CM | POA: Diagnosis not present

## 2022-03-30 DIAGNOSIS — I509 Heart failure, unspecified: Secondary | ICD-10-CM | POA: Diagnosis not present

## 2022-03-30 DIAGNOSIS — F0390 Unspecified dementia without behavioral disturbance: Secondary | ICD-10-CM | POA: Diagnosis not present

## 2022-03-30 DIAGNOSIS — I4901 Ventricular fibrillation: Secondary | ICD-10-CM | POA: Diagnosis not present

## 2022-03-30 DIAGNOSIS — I469 Cardiac arrest, cause unspecified: Secondary | ICD-10-CM | POA: Diagnosis not present

## 2022-03-30 DIAGNOSIS — R079 Chest pain, unspecified: Secondary | ICD-10-CM | POA: Diagnosis not present

## 2022-03-30 DIAGNOSIS — Z7951 Long term (current) use of inhaled steroids: Secondary | ICD-10-CM

## 2022-03-30 DIAGNOSIS — Q2733 Arteriovenous malformation of digestive system vessel: Secondary | ICD-10-CM | POA: Diagnosis not present

## 2022-03-30 DIAGNOSIS — T83031A Leakage of indwelling urethral catheter, initial encounter: Secondary | ICD-10-CM | POA: Diagnosis not present

## 2022-03-30 DIAGNOSIS — N184 Chronic kidney disease, stage 4 (severe): Secondary | ICD-10-CM | POA: Diagnosis not present

## 2022-03-30 DIAGNOSIS — K317 Polyp of stomach and duodenum: Secondary | ICD-10-CM | POA: Diagnosis not present

## 2022-03-30 DIAGNOSIS — G9341 Metabolic encephalopathy: Secondary | ICD-10-CM | POA: Diagnosis not present

## 2022-03-30 DIAGNOSIS — Y738 Miscellaneous gastroenterology and urology devices associated with adverse incidents, not elsewhere classified: Secondary | ICD-10-CM | POA: Diagnosis not present

## 2022-03-30 DIAGNOSIS — F32A Depression, unspecified: Secondary | ICD-10-CM | POA: Diagnosis not present

## 2022-03-30 DIAGNOSIS — I959 Hypotension, unspecified: Secondary | ICD-10-CM | POA: Diagnosis not present

## 2022-03-30 DIAGNOSIS — Z79899 Other long term (current) drug therapy: Secondary | ICD-10-CM

## 2022-03-30 DIAGNOSIS — K921 Melena: Secondary | ICD-10-CM | POA: Diagnosis not present

## 2022-03-30 DIAGNOSIS — D62 Acute posthemorrhagic anemia: Secondary | ICD-10-CM | POA: Diagnosis present

## 2022-03-30 DIAGNOSIS — K766 Portal hypertension: Secondary | ICD-10-CM | POA: Diagnosis not present

## 2022-03-30 DIAGNOSIS — E119 Type 2 diabetes mellitus without complications: Secondary | ICD-10-CM

## 2022-03-30 DIAGNOSIS — K31811 Angiodysplasia of stomach and duodenum with bleeding: Secondary | ICD-10-CM | POA: Diagnosis not present

## 2022-03-30 DIAGNOSIS — F17210 Nicotine dependence, cigarettes, uncomplicated: Secondary | ICD-10-CM | POA: Diagnosis present

## 2022-03-30 DIAGNOSIS — E785 Hyperlipidemia, unspecified: Secondary | ICD-10-CM | POA: Diagnosis present

## 2022-03-30 DIAGNOSIS — K529 Noninfective gastroenteritis and colitis, unspecified: Secondary | ICD-10-CM | POA: Diagnosis present

## 2022-03-30 DIAGNOSIS — R4182 Altered mental status, unspecified: Secondary | ICD-10-CM | POA: Diagnosis not present

## 2022-03-30 DIAGNOSIS — R0902 Hypoxemia: Secondary | ICD-10-CM | POA: Diagnosis not present

## 2022-03-30 DIAGNOSIS — I4821 Permanent atrial fibrillation: Secondary | ICD-10-CM | POA: Diagnosis present

## 2022-03-30 DIAGNOSIS — R410 Disorientation, unspecified: Secondary | ICD-10-CM | POA: Diagnosis not present

## 2022-03-30 DIAGNOSIS — Z8673 Personal history of transient ischemic attack (TIA), and cerebral infarction without residual deficits: Secondary | ICD-10-CM

## 2022-03-30 DIAGNOSIS — R109 Unspecified abdominal pain: Secondary | ICD-10-CM | POA: Diagnosis not present

## 2022-03-30 DIAGNOSIS — Z9841 Cataract extraction status, right eye: Secondary | ICD-10-CM

## 2022-03-30 DIAGNOSIS — R531 Weakness: Secondary | ICD-10-CM | POA: Diagnosis not present

## 2022-03-30 DIAGNOSIS — I110 Hypertensive heart disease with heart failure: Secondary | ICD-10-CM | POA: Diagnosis not present

## 2022-03-30 DIAGNOSIS — Z515 Encounter for palliative care: Secondary | ICD-10-CM | POA: Diagnosis not present

## 2022-03-30 DIAGNOSIS — J189 Pneumonia, unspecified organism: Secondary | ICD-10-CM | POA: Diagnosis present

## 2022-03-30 DIAGNOSIS — Z87891 Personal history of nicotine dependence: Secondary | ICD-10-CM | POA: Diagnosis not present

## 2022-03-30 DIAGNOSIS — M6281 Muscle weakness (generalized): Secondary | ICD-10-CM | POA: Diagnosis not present

## 2022-03-30 DIAGNOSIS — Z66 Do not resuscitate: Secondary | ICD-10-CM | POA: Diagnosis not present

## 2022-03-30 DIAGNOSIS — K219 Gastro-esophageal reflux disease without esophagitis: Secondary | ICD-10-CM | POA: Diagnosis present

## 2022-03-30 DIAGNOSIS — H409 Unspecified glaucoma: Secondary | ICD-10-CM | POA: Diagnosis present

## 2022-03-30 DIAGNOSIS — Z880 Allergy status to penicillin: Secondary | ICD-10-CM

## 2022-03-30 DIAGNOSIS — N400 Enlarged prostate without lower urinary tract symptoms: Secondary | ICD-10-CM | POA: Diagnosis present

## 2022-03-30 DIAGNOSIS — R0602 Shortness of breath: Secondary | ICD-10-CM | POA: Diagnosis not present

## 2022-03-30 DIAGNOSIS — Z4659 Encounter for fitting and adjustment of other gastrointestinal appliance and device: Secondary | ICD-10-CM | POA: Diagnosis not present

## 2022-03-30 DIAGNOSIS — Z7982 Long term (current) use of aspirin: Secondary | ICD-10-CM

## 2022-03-30 DIAGNOSIS — K8020 Calculus of gallbladder without cholecystitis without obstruction: Secondary | ICD-10-CM | POA: Diagnosis not present

## 2022-03-30 DIAGNOSIS — J449 Chronic obstructive pulmonary disease, unspecified: Secondary | ICD-10-CM | POA: Diagnosis present

## 2022-03-30 DIAGNOSIS — D500 Iron deficiency anemia secondary to blood loss (chronic): Secondary | ICD-10-CM | POA: Diagnosis not present

## 2022-03-30 DIAGNOSIS — Z961 Presence of intraocular lens: Secondary | ICD-10-CM | POA: Diagnosis present

## 2022-03-30 DIAGNOSIS — R000 Tachycardia, unspecified: Secondary | ICD-10-CM | POA: Diagnosis not present

## 2022-03-30 DIAGNOSIS — Z86010 Personal history of colonic polyps: Secondary | ICD-10-CM

## 2022-03-30 DIAGNOSIS — G4730 Sleep apnea, unspecified: Secondary | ICD-10-CM | POA: Diagnosis present

## 2022-03-30 DIAGNOSIS — N200 Calculus of kidney: Secondary | ICD-10-CM | POA: Diagnosis not present

## 2022-03-30 DIAGNOSIS — R54 Age-related physical debility: Secondary | ICD-10-CM | POA: Diagnosis present

## 2022-03-30 DIAGNOSIS — R059 Cough, unspecified: Secondary | ICD-10-CM | POA: Diagnosis not present

## 2022-03-30 DIAGNOSIS — Z8249 Family history of ischemic heart disease and other diseases of the circulatory system: Secondary | ICD-10-CM

## 2022-03-30 LAB — BLOOD GAS, VENOUS
Acid-Base Excess: 7.4 mmol/L — ABNORMAL HIGH (ref 0.0–2.0)
Bicarbonate: 34.9 mmol/L — ABNORMAL HIGH (ref 20.0–28.0)
Drawn by: 34092
O2 Saturation: 18.5 %
Patient temperature: 34.8
pCO2, Ven: 64 mmHg — ABNORMAL HIGH (ref 44–60)
pH, Ven: 7.33 (ref 7.25–7.43)
pO2, Ven: <31 mmHg — CL (ref 32–45)

## 2022-03-30 LAB — URINALYSIS, ROUTINE W REFLEX MICROSCOPIC
Bacteria, UA: NONE SEEN
Bilirubin Urine: NEGATIVE
Glucose, UA: NEGATIVE mg/dL
Ketones, ur: NEGATIVE mg/dL
Leukocytes,Ua: NEGATIVE
Nitrite: NEGATIVE
Protein, ur: 30 mg/dL — AB
Specific Gravity, Urine: 1.011 (ref 1.005–1.030)
pH: 5 (ref 5.0–8.0)

## 2022-03-30 LAB — COMPREHENSIVE METABOLIC PANEL
ALT: 10 U/L (ref 0–44)
AST: 18 U/L (ref 15–41)
Albumin: 3 g/dL — ABNORMAL LOW (ref 3.5–5.0)
Alkaline Phosphatase: 74 U/L (ref 38–126)
Anion gap: 12 (ref 5–15)
BUN: 63 mg/dL — ABNORMAL HIGH (ref 8–23)
CO2: 30 mmol/L (ref 22–32)
Calcium: 9.2 mg/dL (ref 8.9–10.3)
Chloride: 97 mmol/L — ABNORMAL LOW (ref 98–111)
Creatinine, Ser: 2.98 mg/dL — ABNORMAL HIGH (ref 0.61–1.24)
GFR, Estimated: 21 mL/min — ABNORMAL LOW (ref >60)
Glucose, Bld: 104 mg/dL — ABNORMAL HIGH (ref 70–99)
Potassium: 3.7 mmol/L (ref 3.5–5.1)
Sodium: 139 mmol/L (ref 135–145)
Total Bilirubin: 0.7 mg/dL (ref 0.3–1.2)
Total Protein: 6.4 g/dL — ABNORMAL LOW (ref 6.5–8.1)

## 2022-03-30 LAB — RETICULOCYTES
Immature Retic Fract: 36.5 % — ABNORMAL HIGH (ref 2.3–15.9)
RBC.: 2.21 MIL/uL — ABNORMAL LOW (ref 4.22–5.81)
Retic Count, Absolute: 102.8 10*3/uL (ref 19.0–186.0)
Retic Ct Pct: 4.7 % — ABNORMAL HIGH (ref 0.4–3.1)

## 2022-03-30 LAB — TSH: TSH: 15.16 u[IU]/mL — ABNORMAL HIGH (ref 0.350–4.500)

## 2022-03-30 LAB — CBC WITH DIFFERENTIAL/PLATELET
Abs Immature Granulocytes: 0.02 10*3/uL (ref 0.00–0.07)
Basophils Absolute: 0.1 10*3/uL (ref 0.0–0.1)
Basophils Relative: 1 %
Eosinophils Absolute: 0.1 10*3/uL (ref 0.0–0.5)
Eosinophils Relative: 2 %
HCT: 25 % — ABNORMAL LOW (ref 39.0–52.0)
Hemoglobin: 7.1 g/dL — ABNORMAL LOW (ref 13.0–17.0)
Immature Granulocytes: 0 %
Lymphocytes Relative: 13 %
Lymphs Abs: 0.8 10*3/uL (ref 0.7–4.0)
MCH: 30.3 pg (ref 26.0–34.0)
MCHC: 28.4 g/dL — ABNORMAL LOW (ref 30.0–36.0)
MCV: 106.8 fL — ABNORMAL HIGH (ref 80.0–100.0)
Monocytes Absolute: 0.8 10*3/uL (ref 0.1–1.0)
Monocytes Relative: 13 %
Neutro Abs: 4.3 10*3/uL (ref 1.7–7.7)
Neutrophils Relative %: 71 %
Platelets: 268 10*3/uL (ref 150–400)
RBC: 2.34 MIL/uL — ABNORMAL LOW (ref 4.22–5.81)
RDW: 18.3 % — ABNORMAL HIGH (ref 11.5–15.5)
WBC: 6.1 10*3/uL (ref 4.0–10.5)
nRBC: 0 % (ref 0.0–0.2)

## 2022-03-30 LAB — IRON AND TIBC
Iron: 133 ug/dL (ref 45–182)
Saturation Ratios: 30 % (ref 17.9–39.5)
TIBC: 439 ug/dL (ref 250–450)
UIBC: 306 ug/dL

## 2022-03-30 LAB — BRAIN NATRIURETIC PEPTIDE: B Natriuretic Peptide: 901 pg/mL — ABNORMAL HIGH (ref 0.0–100.0)

## 2022-03-30 LAB — LACTIC ACID, PLASMA: Lactic Acid, Venous: 1.2 mmol/L (ref 0.5–1.9)

## 2022-03-30 LAB — LIPASE, BLOOD: Lipase: 33 U/L (ref 11–51)

## 2022-03-30 LAB — VITAMIN B12: Vitamin B-12: 408 pg/mL (ref 180–914)

## 2022-03-30 LAB — PROCALCITONIN: Procalcitonin: 0.21 ng/mL

## 2022-03-30 LAB — FOLATE: Folate: 8 ng/mL (ref >5.9)

## 2022-03-30 LAB — CBG MONITORING, ED
Glucose-Capillary: 105 mg/dL — ABNORMAL HIGH (ref 70–99)
Glucose-Capillary: 106 mg/dL — ABNORMAL HIGH (ref 70–99)

## 2022-03-30 LAB — TROPONIN I (HIGH SENSITIVITY): Troponin I (High Sensitivity): 37 ng/L — ABNORMAL HIGH (ref <18)

## 2022-03-30 LAB — FERRITIN: Ferritin: 20 ng/mL — ABNORMAL LOW (ref 24–336)

## 2022-03-30 MED ORDER — ACETAMINOPHEN 650 MG RE SUPP: 650.0000 mg | Freq: Four times a day (QID) | RECTAL | Status: DC | PRN

## 2022-03-30 MED ORDER — FUROSEMIDE 10 MG/ML IJ SOLN
40.0000 mg | Freq: Two times a day (BID) | INTRAMUSCULAR | Status: DC
  Administered 2022-03-31 (×2): 40 mg via INTRAVENOUS
  Filled 2022-03-30 (×2): qty 4

## 2022-03-30 MED ORDER — ACETAMINOPHEN 325 MG PO TABS
650.0000 mg | ORAL_TABLET | Freq: Four times a day (QID) | ORAL | Status: DC | PRN
  Administered 2022-03-31: 650 mg via ORAL
  Filled 2022-03-30: qty 2

## 2022-03-30 MED ORDER — FLUTICASONE FUROATE-VILANTEROL 100-25 MCG/ACT IN AEPB
1.0000 | INHALATION_SPRAY | Freq: Every day | RESPIRATORY_TRACT | Status: DC
  Administered 2022-03-31 – 2022-04-03 (×4): 1 via RESPIRATORY_TRACT
  Filled 2022-03-30 (×2): qty 28

## 2022-03-30 MED ORDER — MORPHINE SULFATE (PF) 4 MG/ML IV SOLN
4.0000 mg | Freq: Once | INTRAVENOUS | Status: AC
  Administered 2022-03-30: 4 mg via INTRAVENOUS
  Filled 2022-03-30: qty 1

## 2022-03-30 MED ORDER — PANTOPRAZOLE SODIUM 40 MG IV SOLR
40.0000 mg | INTRAVENOUS | Status: DC
  Administered 2022-03-30 – 2022-04-02 (×4): 40 mg via INTRAVENOUS
  Filled 2022-03-30 (×5): qty 10

## 2022-03-30 MED ORDER — FUROSEMIDE 10 MG/ML IJ SOLN
40.0000 mg | Freq: Once | INTRAMUSCULAR | Status: AC
  Administered 2022-03-30: 40 mg via INTRAVENOUS
  Filled 2022-03-30: qty 4

## 2022-03-30 MED ORDER — ALBUTEROL SULFATE (2.5 MG/3ML) 0.083% IN NEBU
2.5000 mg | INHALATION_SOLUTION | Freq: Four times a day (QID) | RESPIRATORY_TRACT | Status: DC | PRN
  Administered 2022-04-03 (×3): 2.5 mg via RESPIRATORY_TRACT
  Filled 2022-03-30 (×4): qty 3

## 2022-03-30 MED ORDER — ONDANSETRON HCL 4 MG/2ML IJ SOLN
4.0000 mg | Freq: Once | INTRAMUSCULAR | Status: AC
  Administered 2022-03-30: 4 mg via INTRAVENOUS
  Filled 2022-03-30: qty 2

## 2022-03-30 MED ORDER — LACTATED RINGERS IV BOLUS
1000.0000 mL | Freq: Once | INTRAVENOUS | Status: AC
  Administered 2022-03-30: 1000 mL via INTRAVENOUS

## 2022-03-30 MED ORDER — UMECLIDINIUM BROMIDE 62.5 MCG/ACT IN AEPB
1.0000 | INHALATION_SPRAY | Freq: Every day | RESPIRATORY_TRACT | Status: DC
  Administered 2022-03-31 – 2022-04-03 (×4): 1 via RESPIRATORY_TRACT
  Filled 2022-03-30 (×2): qty 7

## 2022-03-30 MED ORDER — METOPROLOL TARTRATE 5 MG/5ML IV SOLN
5.0000 mg | INTRAVENOUS | Status: DC | PRN
  Administered 2022-03-31: 5 mg via INTRAVENOUS
  Filled 2022-03-30: qty 5

## 2022-03-30 NOTE — Assessment & Plan Note (Addendum)
A1c 5.4.  Controlled. -Hold metformin - CBG Q8h while NPO

## 2022-03-30 NOTE — H&P (Addendum)
History and Physical    Donald Gaines:[id number] DOB: [birth year] DOA: 04/09/2022  PCP: Celene Squibb, MD   Patient coming from: [facility]  I have personally briefly reviewed patient's old medical records in [place]  Chief Complaint: AMS  HPI: Donald G[place] is a 82 y.o. male with medical history significant for diabetes mellitus, atrial fibrillation, COPD, CHF, coronary artery disease, restrictive lung disease. Patient was brought to the ED with complaints of altered mental status and abdominal pain.  Per charge nurse, he was diagnosed with pneumonia last week and has been on antibiotics.  At the time of my evaluation, patient is somnolent, arouses, able to tell me his name but not answering questions.  ED Course: Hypothermic temperature 94.6.  Heart rate 76-94, respiratory rate 10-18.  Blood pressure systolic 1 89-381.  O2 sats 93 to 100% on 2 L. BNP elevated at 901. CT abdomen and pelvis lung base consistent with CHF, pulmonary edema, left lung base atelectasis, pneumonia not excluded, small amount of ascites and diffuse subcutaneous edema consistent with anasarca. Head CT negative for acute abnormality. 1 L bolus initially given, Protonix, morphine 4 mg, then 40 mg Lasix given. Hospitalist to admit for decompensated CHF and encephalopathy.  Review of Systems: Limited due for altered mental status.  Past Medical History:  Diagnosis Date   Arteriovenous malformation small bowel    Capsule study 3/10   Bilateral carotid artery stenosis    BPH (benign prostatic hypertrophy)    Candida esophagitis (HCC)    Cataract    Chronic anemia    Chronic diarrhea    COPD (chronic obstructive pulmonary disease) (HCC)    Coronary atherosclerosis of native coronary artery    Multivessel, BMS SVG TO RCA 2000, reportedly  "2" additional stents in interim   Dementia ([place])    Depression    Diabetes mellitus, type 2 ([place])    Essential hypertension    GERD (gastroesophageal  reflux disease)    GI bleeding    Cecal ulcers, arteriovenous malformations   Glaucoma    Hemorrhoids    Hyperlipidemia    Myocardial infarction ([place])    1994   Portal hypertensive gastropathy ([place]) 11/08/2012   EGD. Dr. Laural Golden   Restrictive lung disease    Sleep apnea    TIA (transient ischemic attack)    2005    Past Surgical History:  Procedure Laterality Date   CAPSULE ENDO  03/10   cardiac catherization  04/2012   CARDIAC CATHETERIZATION  04/05/1996 [name]   EF 45%, occluded SVG to circumflex, patent SVG to D1 and PDA and patent LIMA to LAD with severe native vessel disease.   CARDIAC CATHETERIZATION  08/22/1998 [name]   Mild LM, severe LAD,severe CX, occlude RCA, patient  SVG to diatal RCA, patent vein graft to D1 and patent LIMA to LAD.    CATARACT EXTRACTION W/PHACO Right 09/22/2012   Procedure: CATARACT EXTRACTION PHACO AND INTRAOCULAR LENS PLACEMENT (IOC);  Surgeon: Tonny Branch, MD;  Location: [facility];  Service: Ophthalmology;  Laterality: Right;  CDE: 11.43   CATARACT EXTRACTION W/PHACO Left 10/17/2012   Procedure: CATARACT EXTRACTION PHACO AND INTRAOCULAR LENS PLACEMENT (IOC);  Surgeon: Tonny Branch, MD;  Location: [facility];  Service: Ophthalmology;  Laterality: Left;  CDE: 8.06   COLONOSCOPY  APR 2008   SIMPLE ADENOMA, [place] TICS, IH   COLONOSCOPY  FEB 2008 ANEMIA. MELENA    2 LARGE ILEOCEAL ULCERS 2o to ASA, [place] TICS, IH  COLONOSCOPY  SEP 2009 TRANSFUSION DEP ANEMIA   AC AVM-ABLATED, [place] TICS, IH   COLONOSCOPY WITH ESOPHAGOGASTRODUODENOSCOPY (EGD) N/A 11/08/2012   Procedure: COLONOSCOPY WITH ESOPHAGOGASTRODUODENOSCOPY (EGD);  Surgeon: Rogene Houston, MD;  [facility];  Service: Endoscopy;  Laterality: N/A;  250-rescheduled to 7:30 Ann to notify pt   CORONARY ANGIOPLASTY WITH STENT PLACEMENT  08/22/1998 [name]   TEC stenting of the SVG to RCA-Last seen by cardiologist in 2010.   CORONARY ARTERY BYPASS GRAFT  1995-triple bypass   LIMA-LAD, LIMA to  intermediate branch, SVG to PD and second OM,   ENTEROSCOPY N/A 11/07/2021   Procedure: ENTEROSCOPY;  Surgeon: Harvel Quale, MD;  Location: [facility];  Service: Gastroenterology;  Laterality: N/A;  PUSH ENTEROSCOPY   ESOPHAGOGASTRODUODENOSCOPY  SEP 09   DUODENAL LIPOMA   ESOPHAGOGASTRODUODENOSCOPY N/A 09/27/2013   Procedure: ESOPHAGOGASTRODUODENOSCOPY (EGD) Push enteroscopy;  Surgeon: Rogene Houston, MD;  [facility];  Service: Endoscopy;  Laterality: N/A;   ESOPHAGOGASTRODUODENOSCOPY (EGD) WITH PROPOFOL N/A 11/06/2021   Procedure: ESOPHAGOGASTRODUODENOSCOPY (EGD) WITH PROPOFOL;  Surgeon: Eloise Harman, DO;  [facility];  Service: Endoscopy;  Laterality: N/A;   GIVENS CAPSULE STUDY N/A 11/24/2012   Procedure: GIVENS CAPSULE STUDY;  Surgeon: Rogene Houston, MD;  [facility];  Service: Endoscopy;  Laterality: N/A;  [place] 11/06/2021   Procedure: GIVENS CAPSULE STUDY;  Surgeon: Eloise Harman, DO;  [facility];  Service: Endoscopy;  Laterality: N/A;   HEMOSTASIS CLIP PLACEMENT  11/07/2021   Procedure: HEMOSTASIS CLIP PLACEMENT;  Surgeon: Harvel Quale, MD;  [facility];  Service: Gastroenterology;;   HOT HEMOSTASIS  09/27/2013   Procedure: HOT HEMOSTASIS (ARGON PLASMA COAGULATION/BICAP);  Surgeon: Rogene Houston, MD;  [facility];  Service: Endoscopy;;   HOT HEMOSTASIS  11/07/2021   Procedure: HOT HEMOSTASIS (ARGON PLASMA COAGULATION/BICAP);  Surgeon: Montez Morita, Quillian Quince, MD;  [facility];  Service: Gastroenterology;;   HYDROGEN BREATH TEST  2009   RIGHT INGUINAL HERNIA REPAIR     UPPER GASTROINTESTINAL ENDOSCOPY  SEP 2009   GASTRIC AVM ABLATED, NL DUO Bx     reports that he has been smoking cigarettes. He started smoking about 73 years ago. He has a 60.00 pack-year smoking history. He has been exposed to tobacco smoke. He has never used smokeless  tobacco. He reports current drug use. Drug: Marijuana. He reports that he does not drink alcohol.  Allergies  Allergen Reactions   Penicillins Rash    Has patient had a PCN reaction causing immediate rash, facial/tongue/throat swelling, SOB or lightheadedness with hypotension: Yes Has patient had a PCN reaction causing severe rash involving mucus membranes or skin necrosis: No Has patient had a PCN reaction that required hospitalization No Has patient had a PCN reaction occurring within the last 10 years: No If all of the above answers are "NO", then may proceed with Cephalosporin use.     Family History  Problem Relation Age of Onset   Heart attack Mother    Heart disease Mother    Hypertension Mother     Prior to Admission medications   Medication Sig Start Date End Date Taking? Authorizing Provider  albuterol (PROVENTIL) (2.5 MG/3ML) 0.083% nebulizer solution Take 3 mLs (2.5 mg total) by nebulization every 2 (two) hours as needed for wheezing or shortness of breath. Patient taking differently: Take 2.5 mg by nebulization every 6 (six)  hours as needed for wheezing or shortness of breath (wheezing/SOB). 06/05/20  Yes Johnson, Clanford L, MD  ALPRAZolam (XANAX) 0.5 MG tablet Take 0.5 mg by mouth 2 (two) times daily as needed for anxiety.   Yes [provider]  ASPIRIN ADULT LOW STRENGTH 81 MG tablet Take 81 mg by mouth daily. 02/20/22  Yes [provider]  BREO ELLIPTA 100-25 MCG/ACT AEPB Inhale 1 puff into the lungs daily.   Yes [provider]  cetirizine (ZYRTEC) 10 MG tablet Take 10 mg by mouth daily.   Yes [provider]  docusate sodium (COLACE) 100 MG capsule Take 100 mg by mouth daily. 03/26/22  Yes [provider]  doxycycline (VIBRAMYCIN) 100 MG capsule Take 100 mg by mouth 2 (two) times daily. 7 day course started on 03/26/22 and will end 04/01/22 03/26/22  Yes [provider]  escitalopram (LEXAPRO) 10 MG tablet Take 10 mg by  mouth daily. 05/16/20  Yes [provider]  ferrous sulfate 325 (65 FE) MG tablet Take 325 mg by mouth daily with breakfast.   Yes [provider]  furosemide (LASIX) 40 MG tablet Take 1 tablet (40 mg total) by mouth 2 (two) times daily. 06/05/20  Yes Johnson, Clanford L, MD  gabapentin (NEURONTIN) 100 MG capsule Take 100 mg by mouth at bedtime. 10/30/21  Yes [provider]  INCRUSE ELLIPTA 62.5 MCG/ACT AEPB Inhale 1 puff into the lungs daily.   Yes [provider]  melatonin 3 MG TABS tablet Take 3 mg by mouth at bedtime. 03/20/22  Yes [provider]  metFORMIN (GLUCOPHAGE) 500 MG tablet Take 500 mg by mouth daily with breakfast.   Yes [provider]  metoprolol succinate (TOPROL-XL) 25 MG 24 hr tablet Take 25 mg by mouth daily.   Yes [provider]  omeprazole (PRILOSEC) 20 MG capsule Take 20 mg by mouth daily.  08/28/13  Yes [provider]  rosuvastatin (CRESTOR) 20 MG tablet Take 1 tablet (20 mg total) by mouth daily. Patient taking differently: Take 10 mg by mouth daily. 12/12/13  Yes Satira Sark, MD  Vitamin D, Ergocalciferol, 50000 units CAPS Take 1 capsule by mouth once a week. Sunday 03/12/22  Yes [provider]    Physical Exam: Limited exam due to alteration in mental status Vitals:   04/15/2022 1447 03/29/2022 1530 03/26/2022 1541 04/22/2022 1630  BP:  119/60  111/62  Pulse: 91 85  87  Resp: '12 14  14  '$ Temp: (!) 95.5 F (35.3 C) (!) 95.7 F (35.4 C)  (!) 95.7 F (35.4 C)  TempSrc: Bladder     SpO2: 98% 94%  94%  Weight:   77.1 kg   Height:   '5\' 5"'$  (1.651 m)     Constitutional: NAD, calm, comfortable Vitals:   04/20/2022 1447 03/29/2022 1530 03/24/2022 1541 04/01/2022 1630  BP:  119/60  111/62  Pulse: 91 85  87  Resp: '12 14  14  '$ Temp: (!) 95.5 F (35.3 C) (!) 95.7 F (35.4 C)  (!) 95.7 F (35.4 C)  TempSrc: Bladder     SpO2: 98% 94%  94%  Weight:   77.1 kg   Height:   '5\' 5"'$  (1.651 m)     Eyes: PERRL, lids and conjunctivae normal ENMT: Mucous membranes are moist.  Neck: normal, supple, no masses, no thyromegaly Respiratory: clear to auscultation bilaterally, no wheezing, no crackles. Normal respiratory effort. No accessory muscle use.  Cardiovascular: Regular rate and rhythm, no  murmurs / rubs / gallops.  Trace bilateral extremity edema.  Extremities warm.  Abdomen: Distended, soft, no tenderness, no masses palpated. No hepatosplenomegaly.  Musculoskeletal: no clubbing / cyanosis. No joint deformity upper and lower extremities. Good ROM, no contractures.   Skin: No purpura upper extremities, no ulcers Neurologic: Moving all extremities spontaneously.  No facial asymmetry. Psychiatric: Somnolent.  [place], able to tell me his name , not answering further questions.   Labs on Admission: I have personally reviewed following labs and imaging studies  CBC: Recent Labs  Lab 04/07/2022 1203  WBC 6.1  NEUTROABS 4.3  HGB 7.1*  HCT 25.0*  MCV 106.8*  PLT 025   Basic Metabolic Panel: Recent Labs  Lab 03/29/2022 1203  NA 139  K 3.7  CL 97*  CO2 30  GLUCOSE 104*  BUN 63*  CREATININE 2.98*  CALCIUM 9.2   GFR: Estimated Creatinine Clearance: 19.2 mL/min (A) (by C-G formula based on SCr of 2.98 mg/dL (H)). Liver Function Tests: Recent Labs  Lab 03/24/2022 1203  AST 18  ALT 10  ALKPHOS 74  BILITOT 0.7  PROT 6.4*  ALBUMIN 3.0*   Recent Labs  Lab 03/27/2022 1203  LIPASE 33   CBG: Recent Labs  Lab 04/01/2022 1154 04/14/2022 1356  GLUCAP 105* 106*   Urine analysis:    Component Value Date/Time   COLORURINE YELLOW 04/14/2022 1014   APPEARANCEUR HAZY (A) 03/27/2022 1014   LABSPEC 1.011 04/01/2022 1014   PHURINE 5.0 04/12/2022 1014   GLUCOSEU NEGATIVE 04/14/2022 1014   HGBUR SMALL (A) 04/19/2022 1014   BILIRUBINUR NEGATIVE 04/09/2022 1014   KETONESUR NEGATIVE 04/06/2022 1014   PROTEINUR 30 (A) 03/28/2022 1014   UROBILINOGEN 1.0 12/25/2014 2210   NITRITE  NEGATIVE 04/20/2022 1014   LEUKOCYTESUR NEGATIVE 04/01/2022 1014    Radiological Exams on Admission: CT Head Wo Contrast  Result Date: 04/05/2022 CLINICAL DATA:  Delirium.  Altered mental status. EXAM: CT HEAD WITHOUT CONTRAST TECHNIQUE: Contiguous axial images were obtained from the base of the skull through the vertex without intravenous contrast. RADIATION DOSE REDUCTION: This exam was performed according to the departmental dose-optimization program which includes automated exposure control, adjustment of the mA and/or kV according to patient size and/or use of iterative reconstruction technique. COMPARISON:  December 25, 2014. FINDINGS: Brain: No evidence of acute large vascular territory infarction, hemorrhage, hydrocephalus, extra-axial collection or mass lesion/mass effect. Patchy white matter hypodensities are nonspecific but compatible with chronic microvascular ischemic disease. Vascular: No hyperdense vessel.  Calcific atherosclerosis. Skull: No acute fracture. Sinuses/Orbits: Mild paranasal sinus mucosal thickening. Remote left medial orbital wall fracture. Other: No mastoid effusions. IMPRESSION: No evidence of acute intracranial abnormality. Electronically Signed   By: Margaretha Sheffield M.D.   On: 04/12/2022 14:48   CT ABDOMEN PELVIS WO CONTRAST  Result Date: 03/26/2022 CLINICAL DATA:  Abdominal pain. Altered mental status. Recent diagnosis of pneumonia. EXAM: CT ABDOMEN AND PELVIS WITHOUT CONTRAST TECHNIQUE: Multidetector CT imaging of the abdomen and pelvis was performed following the standard protocol without IV contrast. RADIATION DOSE REDUCTION: This exam was performed according to the departmental dose-optimization program which includes automated exposure control, adjustment of the mA and/or kV according to patient size and/or use of iterative reconstruction technique. COMPARISON:  07/22/2008. FINDINGS: Lower chest: Heart mildly enlarged. Dense three-vessel coronary artery  calcifications. Previous CABG surgery. No pericardial effusion. Small left and trace right pleural effusions. Calcified pleural plaque posteriorly at the right lung base. Left lung base, predominantly lower lobe, atelectasis. Bilateral interstitial thickening  with intervening hazy airspace opacities suspected to be pulmonary edema. Hepatobiliary: Normal liver. Dependent gallstone. No findings of acute cholecystitis. No bile duct dilation. Pancreas: Unremarkable. No pancreatic ductal dilatation or surrounding inflammatory changes. Spleen: Normal in size without focal abnormality. Adrenals/Urinary Tract: 1.1 cm low-attenuation left adrenal mass, average Hounsfield units of 9, consistent with an adenoma, with without significant change. No follow-up recommended. Normal right adrenal gland. Bilateral renal cortical thinning. A few small low-attenuation renal masses consistent with cysts. No follow-up recommended. Nonobstructing stone in the midpole the right kidney. No hydronephrosis. Ureters normal in course and in caliber. Bladder is unremarkable. Stomach/Bowel: Stomach is within normal limits. Appendix appears normal. No evidence of bowel wall thickening, distention, or inflammatory changes. Vascular/Lymphatic: Dense aortic atherosclerotic calcifications. No aneurysm. No enlarged lymph nodes. Reproductive: Unremarkable. Other: Small amount of ascites.  Diffuse subcutaneous edema. Musculoskeletal: No fracture or acute finding.  No bone lesion. IMPRESSION: 1. Lung base findings consistent with congestive heart failure with pulmonary edema, small left and trace right pleural effusions. Additional left lung base opacity is consistent with atelectasis. A component of pneumonia is not excluded, however. 2. Small amount of ascites and diffuse subcutaneous edema consistent with anasarca. 3. No convincing acute abnormality within the abdomen or pelvis. 4. Cholelithiasis. Small nonobstructing stone the right kidney. Dense  aortic atherosclerotic calcifications. Electronically Signed   By: Lajean Manes M.D.   On: 04/06/2022 13:21    EKG: Pending  Assessment/Plan Principal Problem:   Acute on chronic diastolic (congestive) heart failure (HCC) Active Problems:   Iron deficiency anemia due to chronic blood loss   Chronic GI bleeding   Acute metabolic encephalopathy   COPD (chronic obstructive pulmonary disease) (HCC)   Permanent atrial fibrillation (HCC)   Type 2 diabetes mellitus ([place])   Essential hypertension   Assessment and Plan: * Acute on chronic diastolic (congestive) heart failure ([place]) Presenting with abdominal distention, CT showing small ascites, diffuse subcutaneous edema, + trace bilateral lower extremity edema.  BNP elevated at 901.  No hypoxia documented.  Currently on 3 L sats greater than 93%.  Last echo 05/2020 EF 60 to 65%. -IV Lasix 40 mg twice daily -Input output, daily weights, daily BMP -Obtain updated echocardiogram -Obtain chest x-ray - EKG - Trend Trop  Acute metabolic encephalopathy Currently somnolent.  Head CT negative for acute abnormality.  Hypothermic down to 94.6.  No leukocytosis.  Rules out for sepsis.  Normal lactic acid 1.2.  At this time no focus of infection identified.  UA unremarkable, CT abdomen and pelvis not suggestive of infection.  Recently diagnosed with pneumonia about a week ago and was placed on antibiotics- ?  Doxycycline. -Hold off on antibiotics -Follow-up blood cultures -Procalcitonin -Check chest x-ray -Hold Xanax, gabapentin -Remain N.p.o. till improvement in mental status. - Bair huggers  Iron deficiency anemia due to chronic blood loss Hgb 7.1, baseline about 9.  History of chronic GI blood loss-gastric and small bowel AVM's.  Push enteroscopy 11/07/2021 - several nonbleeding angioplastic in duodenum, treated with APC, clip.  Normal endoscopy 11/06/2021.  Hospitalization 10/2021, Plavix was discontinued. - GI consulted in ED, recommended PPI  daily, check anemia panel, out-patient EGD for duodenal polyp removal  Chronic kidney disease (CKD), stage IV (severe) (HCC) Cr- 2.98. At baseline.  Permanent atrial fibrillation (HCC) Rate controlled.  On anticoagulation.  History of GI bleed. -Hold metoprolol for now while NPO -IV metoprolol 5 mg for heart rate > 120  COPD (chronic obstructive pulmonary disease) (HCC) Stable. -Resume home  bronchodilator regimen  Essential hypertension Stable. -Remain n.p.o. for now until improvement in mental status -Hold metoprolol  Type 2 diabetes mellitus (HCC) A1c 5.4.  Controlled. -Hold metformin - CBG Q8h while NPO   DVT prophylaxis: SCDS for now with anemia Code Status: FULL-no documentation at bedside from nursing home stating otherwise. Family Communication: None at bedside.  Will attempt to reach family today. Disposition Plan: > 2 days Consults called:None Admission status: Inpt stepdown I certify that at the point of admission it is my clinical judgment that the patient will require inpatient hospital care spanning beyond 2 midnights from the point of admission due to high intensity of service, high risk for further deterioration and high frequency of surveillance required.   Author: Bethena Roys, MD 04/21/2022 5:35 PM  For on call review [email or web address].

## 2022-03-30 NOTE — Assessment & Plan Note (Addendum)
Currently somnolent.  Head CT negative for acute abnormality.  Hypothermic down to 94.6.  No leukocytosis.  Rules out for sepsis.  Normal lactic acid 1.2.  At this time no focus of infection identified.  UA unremarkable, CT abdomen and pelvis not suggestive of infection.  Recently diagnosed with pneumonia about a week ago and was placed on antibiotics- ?  Doxycycline. -Hold off on antibiotics -Follow-up blood cultures -Procalcitonin -Check chest x-ray -Hold Xanax, gabapentin -Remain N.p.o. till improvement in mental status. - Bair huggers

## 2022-03-30 NOTE — ED Triage Notes (Signed)
Pt brought in by [facility] for c/o ams and abdominal pain;  pt was diagnosed with pneumonia last week and has been taking an antibiotic   Pt c/o increased abdominal pain with abdominal distention

## 2022-03-30 NOTE — Assessment & Plan Note (Addendum)
Hgb 7.1, baseline about 9.  History of chronic GI blood loss-gastric and small bowel AVM's.  Push enteroscopy 11/07/2021 - several nonbleeding angioplastic in duodenum, treated with APC, clip.  Normal endoscopy 11/06/2021.  Hospitalization 10/2021, Plavix was discontinued. - GI consulted in ED, recommended PPI daily, check anemia panel, out-patient EGD for duodenal polyp removal

## 2022-03-30 NOTE — ED Notes (Signed)
Admitting at bedside 

## 2022-03-30 NOTE — Assessment & Plan Note (Signed)
Stable. -Remain n.p.o. for now until improvement in mental status -Hold metoprolol

## 2022-03-30 NOTE — Assessment & Plan Note (Addendum)
Rate controlled.  On anticoagulation.  History of GI bleed. -Hold metoprolol for now while NPO -IV metoprolol 5 mg for heart rate > 120

## 2022-03-30 NOTE — Assessment & Plan Note (Addendum)
Presenting with abdominal distention, CT showing small ascites, diffuse subcutaneous edema, + trace bilateral lower extremity edema.  BNP elevated at 901.  No hypoxia documented.  Currently on 3 L sats greater than 93%.  Last echo 05/2020 EF 60 to 65%. -IV Lasix 40 mg twice daily -Input output, daily weights, daily BMP -Obtain updated echocardiogram -Obtain chest x-ray - EKG - Trend Trop

## 2022-03-30 NOTE — Assessment & Plan Note (Signed)
Stable. -Resume home bronchodilator regimen. 

## 2022-03-30 NOTE — ED Notes (Signed)
Patients niece at bedside and updated on plan of care. Personal info placed in chart.

## 2022-03-30 NOTE — Consult Note (Signed)
Gastroenterology Consult   Referring Provider: Dr. Teressa Lower, [facility] Primary Care Physician:  Celene Squibb, MD Primary Gastroenterologist:  Dr. Jenetta Downer   Patient ID: Donald Gaines; [id number]; 02-25-1941   Admit date: 04/16/2022  LOS: 0 days   Date of Consultation: 04/11/2022  Reason for Consultation:  Anemia   History of Present Illness   Donald Gaines is a 82 y.o. year old male with PMH significant for h/o IDA due to chronic GI blood loss in setting of gastric and small bowel AVMS, COPD, DM, HTN, TIA, Afib not anticoagulated due to recurrent GI bleeding, CAD, presenting with hypothermia and hypoxia on room air, altered mental status. GI consulted as Hgb 7.1, down from 9 range in Aug 2023 when last hospitalized.   Currently with Bair hugger in placed. CT abd/pelvis without contrast showed lung base findings consistent with CHF with pulmonary edema, small left and trace right pleural effusions, unable to exclude pneumonia, small amount of ascites and diffuse subcutaneous edema consistent with anasarca, cholelithiasis. Labs in ED with Hgb 7.1, MCV 106.8, normal lipase, normal lactic acid, BNP 901, creatinine 2.98, albumin 3.0, blood cultures in process.  No overt GI bleeding. Unable to obtain any history due to mental status changes. No family at bedside. Hemoccult status unknown.      Recent evaluation Aug 2023 during hospitalization:  EGD Aug 2023 with normal stomach, one duodenal polyp 10 mm not manipulated as on Plavix.   Capsule Aug 2023: AVMs  Push enteroscopy Aug 2023: single 10 mm pedunculated polyp without bleeding in second portion of duodenum. Not removed as had taken Plavix 2 days prior. Seven AVMs without bleeding in duodenum s/p APC and clip placement. Two AVMs in proximal jejunum s/p APC. Recommendations for repeat EGD in 6-8 weeks for duodenal polyp removal.    Remote work-up Small bowel enteroscopy 09/2013: mild portal gastropathy. Five small AVMs  in proximal jejunum without bleeding stigmata s/p APC.   VCE 11/2012: two small duodenal diverticula without stigmata of bleeding. Two small AVMs without bleeding stigmata.    EGD/colonoscopy 10/2012: portal gastropathy. Normal TI. Single small cecal AVM without bleeding. Five small cecal polyps removed. Small rectal polyp removed. External hemorrhoids. Cecal polyps tubular adenomas, next colonoscopy 2019.   Past Medical History:  Diagnosis Date   Arteriovenous malformation small bowel    Capsule study 3/10   Bilateral carotid artery stenosis    BPH (benign prostatic hypertrophy)    Candida esophagitis (HCC)    Cataract    Chronic anemia    Chronic diarrhea    COPD (chronic obstructive pulmonary disease) (HCC)    Coronary atherosclerosis of native coronary artery    Multivessel, BMS SVG TO RCA 2000, reportedly  "2" additional stents in interim   Dementia ([place])    Depression    Diabetes mellitus, type 2 ([place])    Essential hypertension    GERD (gastroesophageal reflux disease)    GI bleeding    Cecal ulcers, arteriovenous malformations   Glaucoma    Hemorrhoids    Hyperlipidemia    Myocardial infarction ([place])    1994   Portal hypertensive gastropathy ([place]) 11/08/2012   EGD. Dr. Laural Golden   Restrictive lung disease    Sleep apnea    TIA (transient ischemic attack)    2005    Past Surgical History:  Procedure Laterality Date   CAPSULE ENDO  03/10   cardiac catherization  04/2012   CARDIAC CATHETERIZATION  04/05/1996 [name]  EF 45%, occluded SVG to circumflex, patent SVG to D1 and PDA and patent LIMA to LAD with severe native vessel disease.   CARDIAC CATHETERIZATION  08/22/1998 [name]   Mild LM, severe LAD,severe CX, occlude RCA, patient  SVG to diatal RCA, patent vein graft to D1 and patent LIMA to LAD.    CATARACT EXTRACTION W/PHACO Right 09/22/2012   Procedure: CATARACT EXTRACTION PHACO AND INTRAOCULAR LENS PLACEMENT (IOC);  Surgeon: Tonny Branch, MD;  Location: [facility]  ORS;  Service: Ophthalmology;  Laterality: Right;  CDE: 11.43   CATARACT EXTRACTION W/PHACO Left 10/17/2012   Procedure: CATARACT EXTRACTION PHACO AND INTRAOCULAR LENS PLACEMENT (IOC);  Surgeon: Tonny Branch, MD;  Location: [facility] ORS;  Service: Ophthalmology;  Laterality: Left;  CDE: 8.06   COLONOSCOPY  APR 2008   SIMPLE ADENOMA, [place] TICS, IH   COLONOSCOPY  FEB 2008 ANEMIA. MELENA    2 LARGE ILEOCEAL ULCERS 2o to ASA, [place] TICS, IH   COLONOSCOPY  SEP 2009 TRANSFUSION DEP ANEMIA   AC AVM-ABLATED, [place] TICS, IH   COLONOSCOPY WITH ESOPHAGOGASTRODUODENOSCOPY (EGD) N/A 11/08/2012   Procedure: COLONOSCOPY WITH ESOPHAGOGASTRODUODENOSCOPY (EGD);  Surgeon: Rogene Houston, MD;  Location: [facility];  Service: Endoscopy;  Laterality: N/A;  250-rescheduled to 7:30 Ann to notify pt   CORONARY ANGIOPLASTY WITH STENT PLACEMENT  08/22/1998 [name]   TEC stenting of the SVG to RCA-Last seen by cardiologist in 2010.   CORONARY ARTERY BYPASS GRAFT  1995-triple bypass   LIMA-LAD, LIMA to intermediate branch, SVG to PD and second OM,   ENTEROSCOPY N/A 11/07/2021   Procedure: ENTEROSCOPY;  Surgeon: Harvel Quale, MD;  Location: [facility];  Service: Gastroenterology;  Laterality: N/A;  PUSH ENTEROSCOPY   ESOPHAGOGASTRODUODENOSCOPY  SEP 09   DUODENAL LIPOMA   ESOPHAGOGASTRODUODENOSCOPY N/A 09/27/2013   Procedure: ESOPHAGOGASTRODUODENOSCOPY (EGD) Push enteroscopy;  Surgeon: Rogene Houston, MD;  [facility];  Service: Endoscopy;  Laterality: N/A;   ESOPHAGOGASTRODUODENOSCOPY (EGD) WITH PROPOFOL N/A 11/06/2021   Procedure: ESOPHAGOGASTRODUODENOSCOPY (EGD) WITH PROPOFOL;  Surgeon: Eloise Harman, DO;  [facility];  Service: Endoscopy;  Laterality: N/A;   GIVENS CAPSULE STUDY N/A 11/24/2012   Procedure: GIVENS CAPSULE STUDY;  Surgeon: Rogene Houston, MD;  [facility];  Service: Endoscopy;  Laterality: N/A;  [place] 11/06/2021   Procedure: GIVENS  CAPSULE STUDY;  Surgeon: Eloise Harman, DO;  Location[facility];  Service: Endoscopy;  Laterality: N/A;   HEMOSTASIS CLIP PLACEMENT  11/07/2021   Procedure: HEMOSTASIS CLIP PLACEMENT;  Surgeon: Harvel Quale, MD;  Location[facility];  Service: Gastroenterology;;   HOT HEMOSTASIS  09/27/2013   Procedure: HOT HEMOSTASIS (ARGON PLASMA COAGULATION/BICAP);  Surgeon: Rogene Houston, MD;  [facility];  Service: Endoscopy;;   HOT HEMOSTASIS  11/07/2021   Procedure: HOT HEMOSTASIS (ARGON PLASMA COAGULATION/BICAP);  Surgeon: Montez Morita, Quillian Quince, MD;  Location[facility];  Service: Gastroenterology;;   HYDROGEN BREATH TEST  2009   RIGHT INGUINAL HERNIA REPAIR     UPPER GASTROINTESTINAL ENDOSCOPY  SEP 2009   GASTRIC AVM ABLATED, NL DUO Bx    Prior to Admission medications   Medication Sig Start Date End Date Taking? Authorizing Provider  acetaminophen (TYLENOL) 500 MG tablet Take 500 mg by mouth every 6 (six) hours as needed for headache.    [provider]  albuterol (PROVENTIL) (2.5 MG/3ML) 0.083% nebulizer solution Take 3 mLs (2.5 mg total)  by nebulization every 2 (two) hours as needed for wheezing or shortness of breath. 06/05/20   Johnson, Clanford L, MD  ALPRAZolam Duanne Moron) 0.5 MG tablet Take 0.5 mg by mouth 2 (two) times daily as needed for anxiety.    [provider]  carvedilol (COREG) 12.5 MG tablet Take 12.5 mg by mouth 2 (two) times daily with a meal.  03/26/14   [provider]  cetirizine (ZYRTEC) 10 MG tablet Take 10 mg by mouth at bedtime.     [provider]  Cholecalciferol (VITAMIN D3) 1.25 MG (50000 UT) CAPS Take 1 capsule by mouth once a week. 10/30/21   [provider]  escitalopram (LEXAPRO) 10 MG tablet Take 10 mg by mouth daily. 05/16/20   [provider]  ferrous sulfate 325 (65 FE) MG tablet Take 325 mg by mouth daily with breakfast.    [provider]  furosemide (LASIX) 40 MG  tablet Take 1 tablet (40 mg total) by mouth 2 (two) times daily. 06/05/20   Johnson, Clanford L, MD  gabapentin (NEURONTIN) 100 MG capsule Take 100 mg by mouth daily. 10/30/21   [provider]  metFORMIN (GLUCOPHAGE) 500 MG tablet Take 500 mg by mouth daily with breakfast.    [provider]  NITROSTAT 0.4 MG SL tablet Place 0.4 mg under the tongue every 5 (five) minutes as needed for chest pain.  05/23/10   [provider]  omeprazole (PRILOSEC) 20 MG capsule Take 20 mg by mouth daily.  08/28/13   [provider]  rosuvastatin (CRESTOR) 20 MG tablet Take 1 tablet (20 mg total) by mouth daily. 12/12/13   Satira Sark, MD  umeclidinium-vilanterol (ANORO ELLIPTA) 62.5-25 MCG/INH AEPB Inhale 1 puff into the lungs daily.    [provider]    No current facility-administered medications for this encounter.   Current Outpatient Medications  Medication Sig Dispense Refill   acetaminophen (TYLENOL) 500 MG tablet Take 500 mg by mouth every 6 (six) hours as needed for headache.     albuterol (PROVENTIL) (2.5 MG/3ML) 0.083% nebulizer solution Take 3 mLs (2.5 mg total) by nebulization every 2 (two) hours as needed for wheezing or shortness of breath. 75 mL 12   ALPRAZolam (XANAX) 0.5 MG tablet Take 0.5 mg by mouth 2 (two) times daily as needed for anxiety.     carvedilol (COREG) 12.5 MG tablet Take 12.5 mg by mouth 2 (two) times daily with a meal.      cetirizine (ZYRTEC) 10 MG tablet Take 10 mg by mouth at bedtime.      Cholecalciferol (VITAMIN D3) 1.25 MG (50000 UT) CAPS Take 1 capsule by mouth once a week.     escitalopram (LEXAPRO) 10 MG tablet Take 10 mg by mouth daily.     ferrous sulfate 325 (65 FE) MG tablet Take 325 mg by mouth daily with breakfast.     furosemide (LASIX) 40 MG tablet Take 1 tablet (40 mg total) by mouth 2 (two) times daily. 30 tablet    gabapentin (NEURONTIN) 100 MG capsule Take 100 mg by mouth daily.     metFORMIN (GLUCOPHAGE) 500  MG tablet Take 500 mg by mouth daily with breakfast.     NITROSTAT 0.4 MG SL tablet Place 0.4 mg under the tongue every 5 (five) minutes as needed for chest pain.      omeprazole (PRILOSEC) 20 MG capsule Take 20 mg by mouth daily.      rosuvastatin (CRESTOR) 20 MG tablet Take  1 tablet (20 mg total) by mouth daily. 90 tablet 0   umeclidinium-vilanterol (ANORO ELLIPTA) 62.5-25 MCG/INH AEPB Inhale 1 puff into the lungs daily.      Allergies as of 04/06/2022 - Review Complete 04/19/2022  Allergen Reaction Noted   Penicillins Rash 05/25/2008    Family History  Problem Relation Age of Onset   Heart attack Mother    Heart disease Mother    Hypertension Mother     Social History   Socioeconomic History   Marital status: Divorced    Spouse name: Not on file   Number of children: Not on file   Years of education: 64   Highest education level: 12th grade  Occupational History   Not on file  Tobacco Use   Smoking status: Every Day    Packs/day: 1.00    Years: 60.00    Total pack years: 60.00    Types: Cigarettes    Start date: 01/02/1949    Passive exposure: Current   Smokeless tobacco: Never   Tobacco comments:    1 pack a day    Smoking Cessation Offered  Vaping Use   Vaping Use: Never used  Substance and Sexual Activity   Alcohol use: No    Alcohol/week: 0.0 standard drinks of alcohol    Comment: No etoh in 12 yrs.   Drug use: Yes    Types: Marijuana    Comment: last use 10/25/19   Sexual activity: Yes    Partners: Female    Birth control/protection: None  Other Topics Concern   Not on file  Social History Narrative   Not on file   Social Determinants of Health   Financial Resource Strain: Low Risk  (01/26/2022)   Overall Financial Resource Strain (CARDIA)    Difficulty of Paying Living Expenses: Not hard at all  Food Insecurity: No Food Insecurity (01/26/2022)   Hunger Vital Sign    Worried About Running Out of Food in the Last Year: Never true    Ran Out of Food  in the Last Year: Never true  Recent Concern: [place] Present (12/22/2021)   Hunger Vital Sign    Worried About Running Out of Food in the Last Year: Sometimes true    Ran Out of Food in the Last Year: Sometimes true  Transportation Needs: No Transportation Needs (01/26/2022)   PRAPARE - Hydrologist (Medical): No    Lack of Transportation (Non-Medical): No  Physical Activity: Inactive (01/26/2022)   Exercise Vital Sign    Days of Exercise per Week: 0 days    Minutes of Exercise per Session: 0 min  Stress: No Stress Concern Present (01/26/2022)   [place]    Feeling of Stress : Only a little  Social Connections: Moderately Integrated (01/26/2022)   Social Connection and Isolation Panel [NHANES]    Frequency of Communication with Friends and Family: More than three times a week    Frequency of Social Gatherings with Friends and Family: More than three times a week    Attends Religious Services: More than 4 times per year    Active Member of [employer] or Organizations: Yes    Attends Archivist Meetings: More than 4 times per year    Marital Status: Divorced  Intimate Partner Violence: Not At Risk (01/26/2022)   Humiliation, Afraid, Rape, and Kick questionnaire    Fear of Current or Ex-Partner: No  Emotionally Abused: No    Physically Abused: No    Sexually Abused: No     Review of Systems   Unable to obtain due to mental status   Physical Exam   Vital Signs in last 24 hours: Temp:  [94.6 F (34.8 C)-95.7 F (35.4 C)] 95.7 F (35.4 C) (01/08 1530) Pulse Rate:  [76-94] 85 (01/08 1530) Resp:  [10-18] 14 (01/08 1530) BP: (113-124)/(60-108) 119/60 (01/08 1530) SpO2:  [93 %-100 %] 94 % (01/08 1530) Weight:  [77.1 kg] 77.1 kg (01/08 1541)    General:   resting with eyes closed. Frail, chronically ill-appearing, Bair hugger in place  Head:   Normocephalic and atraumatic. Eyes:  Sclera clear, no icterus.   Conjunctiva pink. Lungs:  clear bilaterally Heart:  S1 S2 present without murmurs Abdomen:  distended but soft, diffuse anasarca   Rectal: deferred   Msk:  Symmetrical without gross deformities.  Extremities:  Without edema. Neurologic:  unable to assess  Skin:  Intact without significant lesions or rashes.   Intake/Output from previous day: No intake/output data recorded. Intake/Output this shift: No intake/output data recorded.    Labs/Studies   Recent Labs Recent Labs    04/17/2022 1203  WBC 6.1  HGB 7.1*  HCT 25.0*  PLT 268   BMET Recent Labs    04/22/2022 1203  NA 139  K 3.7  CL 97*  CO2 30  GLUCOSE 104*  BUN 63*  CREATININE 2.98*  CALCIUM 9.2   LFT Recent Labs    03/25/2022 1203  PROT 6.4*  ALBUMIN 3.0*  AST 18  ALT 10  ALKPHOS 74  BILITOT 0.7     Radiology/Studies CT Head Wo Contrast  Result Date: 03/26/2022 CLINICAL DATA:  Delirium.  Altered mental status. EXAM: CT HEAD WITHOUT CONTRAST TECHNIQUE: Contiguous axial images were obtained from the base of the skull through the vertex without intravenous contrast. RADIATION DOSE REDUCTION: This exam was performed according to the departmental dose-optimization program which includes automated exposure control, adjustment of the mA and/or kV according to patient size and/or use of iterative reconstruction technique. COMPARISON:  December 25, 2014. FINDINGS: Brain: No evidence of acute large vascular territory infarction, hemorrhage, hydrocephalus, extra-axial collection or mass lesion/mass effect. Patchy white matter hypodensities are nonspecific but compatible with chronic microvascular ischemic disease. Vascular: No hyperdense vessel.  Calcific atherosclerosis. Skull: No acute fracture. Sinuses/Orbits: Mild paranasal sinus mucosal thickening. Remote left medial orbital wall fracture. Other: No mastoid effusions. IMPRESSION: No evidence of acute  intracranial abnormality. Electronically Signed   By: Margaretha Sheffield M.D.   On: 04/19/2022 14:48   CT ABDOMEN PELVIS WO CONTRAST  Result Date: 04/01/2022 CLINICAL DATA:  Abdominal pain. Altered mental status. Recent diagnosis of pneumonia. EXAM: CT ABDOMEN AND PELVIS WITHOUT CONTRAST TECHNIQUE: Multidetector CT imaging of the abdomen and pelvis was performed following the standard protocol without IV contrast. RADIATION DOSE REDUCTION: This exam was performed according to the departmental dose-optimization program which includes automated exposure control, adjustment of the mA and/or kV according to patient size and/or use of iterative reconstruction technique. COMPARISON:  07/22/2008. FINDINGS: Lower chest: Heart mildly enlarged. Dense three-vessel coronary artery calcifications. Previous CABG surgery. No pericardial effusion. Small left and trace right pleural effusions. Calcified pleural plaque posteriorly at the right lung base. Left lung base, predominantly lower lobe, atelectasis. Bilateral interstitial thickening with intervening hazy airspace opacities suspected to be pulmonary edema. Hepatobiliary: Normal liver. Dependent gallstone. No findings of acute cholecystitis. No bile duct dilation. Pancreas: Unremarkable. No  pancreatic ductal dilatation or surrounding inflammatory changes. Spleen: Normal in size without focal abnormality. Adrenals/Urinary Tract: 1.1 cm low-attenuation left adrenal mass, average Hounsfield units of 9, consistent with an adenoma, with without significant change. No follow-up recommended. Normal right adrenal gland. Bilateral renal cortical thinning. A few small low-attenuation renal masses consistent with cysts. No follow-up recommended. Nonobstructing stone in the midpole the right kidney. No hydronephrosis. Ureters normal in course and in caliber. Bladder is unremarkable. Stomach/Bowel: Stomach is within normal limits. Appendix appears normal. No evidence of bowel wall  thickening, distention, or inflammatory changes. Vascular/Lymphatic: Dense aortic atherosclerotic calcifications. No aneurysm. No enlarged lymph nodes. Reproductive: Unremarkable. Other: Small amount of ascites.  Diffuse subcutaneous edema. Musculoskeletal: No fracture or acute finding.  No bone lesion. IMPRESSION: 1. Lung base findings consistent with congestive heart failure with pulmonary edema, small left and trace right pleural effusions. Additional left lung base opacity is consistent with atelectasis. A component of pneumonia is not excluded, however. 2. Small amount of ascites and diffuse subcutaneous edema consistent with anasarca. 3. No convincing acute abnormality within the abdomen or pelvis. 4. Cholelithiasis. Small nonobstructing stone the right kidney. Dense aortic atherosclerotic calcifications. Electronically Signed   By: Lajean Manes M.D.   On: 04/01/2022 13:21     Assessment   Donald Gaines is a 82 y.o. year old male  with PMH significant for h/o IDA due to chronic GI blood loss in setting of gastric and small bowel AVMS, COPD, DM, HTN, TIA, Afib not anticoagulated due to recurrent GI bleeding, CAD, presenting with hypothermia and hypoxia on room air, altered mental status. GI consulted as Hgb 7.1, down from 9 range in Aug 2023 when last hospitalized.   Worsening anemia: multifactorial in setting of acute illness, known AVMs, likely nutritional component as well. Will check anemia panel. Will need non-urgent EGD in future for duodenal polyp removal. Recommend outpatient EGD unless evidence for overt GI bleeding or worsening anemia.          Plan / Recommendations    Check anemia panel PPI daily Monitor for overt GI bleeding Outpatient EGD for duodenal polyp removal     04/13/2022, 3:52 PM  Annitta Needs, PhD, [facility] Gastroenterology

## 2022-03-30 NOTE — Assessment & Plan Note (Signed)
Cr- 2.98. At baseline.

## 2022-03-30 NOTE — ED Provider Notes (Signed)
[facility] EMERGENCY DEPARTMENT Provider Note  CSN: [id number] Arrival date & time: 04/10/2022 1138  Chief Complaint(s) Altered Mental Status  HPI Donald Gaines is a 82 y.o. male with PMH COPD, CAD, HTN, cecal AVMs, A-fib who presents emergency department for evaluation of abdominal pain, abdominal distention and altered mental status.  Patient reportedly was diagnosed with pneumonia last week and has been on antibiotics.  Patient arrives hypothermic and hypoxic on room air but saturating well on 2 L nasal cannula.  He has obvious abdominal distention and generalized tenderness to palpation.  Additional history unable to be obtained secondary to patient's underlying altered mental status.   Past Medical History Past Medical History:  Diagnosis Date   Arteriovenous malformation small bowel    Capsule study 3/10   Bilateral carotid artery stenosis    BPH (benign prostatic hypertrophy)    Candida esophagitis (HCC)    Cataract    Chronic anemia    Chronic diarrhea    COPD (chronic obstructive pulmonary disease) (HCC)    Coronary atherosclerosis of native coronary artery    Multivessel, BMS SVG TO RCA 2000, reportedly  "2" additional stents in interim   Dementia ([place])    Depression    Diabetes mellitus, type 2 ([place])    Essential hypertension    GERD (gastroesophageal reflux disease)    GI bleeding    Cecal ulcers, arteriovenous malformations   Glaucoma    Hemorrhoids    Hyperlipidemia    Myocardial infarction ([place])    1994   Portal hypertensive gastropathy ([place]) 11/08/2012   EGD. Dr. Laural Golden   Restrictive lung disease    Sleep apnea    TIA (transient ischemic attack)    2005   Patient Active Problem List   Diagnosis Date Noted   Essential hypertension    Permanent atrial fibrillation ([place])    GI bleed 11/05/2021   CHF (congestive heart failure) ([place]) 06/03/2020   Prolonged QT interval 06/03/2020   Elevated brain natriuretic peptide (BNP) level 06/03/2020   Prediabetes  06/03/2020   Gram-positive bacteremia 07/29/2015   ARF (acute renal failure) ([place]) 07/27/2015   Leukocytosis 07/27/2015   COPD exacerbation ([name]) 07/27/2015   Hypokalemia 12/26/2014   Persistent atrial fibrillation ([place]) 11/22/2013   COPD (chronic obstructive pulmonary disease) ([place]) 09/24/2013   Macrocytic anemia 09/24/2013   Coronary atherosclerosis of native coronary artery 11/28/2012   Community acquired pneumonia 11/15/2012   Chronic GI bleeding 11/15/2012   Atypical chest pain 11/15/2012   Essential hypertension, benign 07/28/2010   Mixed hyperlipidemia 07/28/2010   IBS 11/20/2009   COLONIC POLYPS, ADENOMATOUS 09/13/2008   Type 2 diabetes mellitus ([place]) 09/13/2008   TOBACCO ABUSE 09/13/2008   HEMORRHOIDS, INTERNAL 16/60/6301   EOSINOPHILIC ESOPHAGITIS 60/12/9321   FATTY LIVER DISEASE 09/13/2008   Iron deficiency anemia due to chronic blood loss 05/25/2008   CONSTIPATION 05/25/2008   DIARRHEA 05/25/2008   Home Medication(s) Prior to Admission medications   Medication Sig Start Date End Date Taking? Authorizing Provider  acetaminophen (TYLENOL) 500 MG tablet Take 500 mg by mouth every 6 (six) hours as needed for headache.    [provider]  albuterol (PROVENTIL) (2.5 MG/3ML) 0.083% nebulizer solution Take 3 mLs (2.5 mg total) by nebulization every 2 (two) hours as needed for wheezing or shortness of breath. 06/05/20   Johnson, Clanford L, MD  ALPRAZolam Duanne Moron) 0.5 MG tablet Take 0.5 mg by mouth 2 (two) times daily as needed for anxiety.    [provider]  carvedilol (  COREG) 12.5 MG tablet Take 12.5 mg by mouth 2 (two) times daily with a meal.  03/26/14   [provider]  cetirizine (ZYRTEC) 10 MG tablet Take 10 mg by mouth at bedtime.     [provider]  Cholecalciferol (VITAMIN D3) 1.25 MG (50000 UT) CAPS Take 1 capsule by mouth once a week. 10/30/21   [provider]  escitalopram (LEXAPRO) 10 MG tablet Take 10 mg by mouth daily.  05/16/20   [provider]  ferrous sulfate 325 (65 FE) MG tablet Take 325 mg by mouth daily with breakfast.    [provider]  furosemide (LASIX) 40 MG tablet Take 1 tablet (40 mg total) by mouth 2 (two) times daily. 06/05/20   Johnson, Clanford L, MD  gabapentin (NEURONTIN) 100 MG capsule Take 100 mg by mouth daily. 10/30/21   [provider]  metFORMIN (GLUCOPHAGE) 500 MG tablet Take 500 mg by mouth daily with breakfast.    [provider]  NITROSTAT 0.4 MG SL tablet Place 0.4 mg under the tongue every 5 (five) minutes as needed for chest pain.  05/23/10   [provider]  omeprazole (PRILOSEC) 20 MG capsule Take 20 mg by mouth daily.  08/28/13   [provider]  rosuvastatin (CRESTOR) 20 MG tablet Take 1 tablet (20 mg total) by mouth daily. 12/12/13   Satira Sark, MD  umeclidinium-vilanterol (ANORO ELLIPTA) 62.5-25 MCG/INH AEPB Inhale 1 puff into the lungs daily.    [provider]                                                                                                                                    Past Surgical History Past Surgical History:  Procedure Laterality Date   CAPSULE ENDO  03/10   cardiac catherization  04/2012   CARDIAC CATHETERIZATION  04/05/1996 [name]   EF 45%, occluded SVG to circumflex, patent SVG to D1 and PDA and patent LIMA to LAD with severe native vessel disease.   CARDIAC CATHETERIZATION  08/22/1998 [name]   Mild LM, severe LAD,severe CX, occlude RCA, patient  SVG to diatal RCA, patent vein graft to D1 and patent LIMA to LAD.    CATARACT EXTRACTION W/PHACO Right 09/22/2012   Procedure: CATARACT EXTRACTION PHACO AND INTRAOCULAR LENS PLACEMENT (IOC);  Surgeon: Tonny Branch, MD;  Location: [facility];  Service: Ophthalmology;  Laterality: Right;  CDE: 11.43   CATARACT EXTRACTION W/PHACO Left 10/17/2012   Procedure: CATARACT EXTRACTION PHACO AND INTRAOCULAR LENS PLACEMENT (IOC);  Surgeon: Tonny Branch, MD;  Location: [facility];  Service: Ophthalmology;  Laterality: Left;  CDE: 8.06   COLONOSCOPY  APR 2008   SIMPLE ADENOMA, [place] TICS, IH   COLONOSCOPY  FEB 2008 ANEMIA. MELENA    2 LARGE ILEOCEAL ULCERS 2o to ASA, [place] TICS, IH   COLONOSCOPY  SEP 2009 TRANSFUSION DEP ANEMIA   AC AVM-ABLATED, [place] TICS,  IH   COLONOSCOPY WITH ESOPHAGOGASTRODUODENOSCOPY (EGD) N/A 11/08/2012   Procedure: COLONOSCOPY WITH ESOPHAGOGASTRODUODENOSCOPY (EGD);  Surgeon: Rogene Houston, MD;  Location: [facility] SU[facility];  Service: Endoscopy;  Laterality: N/A;  250-rescheduled to 7:30 Ann to notify pt   CORONARY ANGIOPLASTY WITH STENT PLACEMENT  08/22/1998 [name]   TEC stenting of the SVG to RCA-Last seen by cardiologist in 2010.   CORONARY ARTERY BYPASS GRAFT  1995-triple bypass   LIMA-LAD, LIMA to intermediate branch, SVG to PD and second OM,   ENTEROSCOPY N/A 11/07/2021   Procedure: ENTEROSCOPY;  Surgeon: Harvel Quale, MD;  Location: [facility];  Service: Gastroenterology;  Laterality: N/A;  PUSH ENTEROSCOPY   ESOPHAGOGASTRODUODENOSCOPY  SEP 09   DUODENAL LIPOMA   ESOPHAGOGASTRODUODENOSCOPY N/A 09/27/2013   Procedure: ESOPHAGOGASTRODUODENOSCOPY (EGD) Push enteroscopy;  Surgeon: Rogene Houston, MD;  Location: [facility];  Service: Endoscopy;  Laterality: N/A;   ESOPHAGOGASTRODUODENOSCOPY (EGD) WITH PROPOFOL N/A 11/06/2021   Procedure: ESOPHAGOGASTRODUODENOSCOPY (EGD) WITH PROPOFOL;  Surgeon: Eloise Harman, DO;  [facility]: [facility];  Service: Endoscopy;  Laterality: N/A;   GIVENS CAPSULE STUDY N/A 11/24/2012   Procedure: GIVENS CAPSULE STUDY;  Surgeon: Rogene Houston, MD;  Location: [facility];  Service: Endoscopy;  Laterality: N/A;  [place] 11/06/2021   Procedure: GIVENS CAPSULE STUDY;  Surgeon: Eloise Harman, DO;  Location: [facility];  Service: Endoscopy;  Laterality: N/A;   HEMOSTASIS CLIP PLACEMENT  11/07/2021   Procedure: HEMOSTASIS CLIP PLACEMENT;  Surgeon:  Harvel Quale, MD;  Location: [facility];  Service: Gastroenterology;;   HOT HEMOSTASIS  09/27/2013   Procedure: HOT HEMOSTASIS (ARGON PLASMA COAGULATION/BICAP);  Surgeon: Rogene Houston, MD;  Location: [facility];  Service: Endoscopy;;   HOT HEMOSTASIS  11/07/2021   Procedure: HOT HEMOSTASIS (ARGON PLASMA COAGULATION/BICAP);  Surgeon: Montez Morita, Quillian Quince, MD;  Location: [facility];  Service: Gastroenterology;;   HYDROGEN BREATH TEST  2009   RIGHT INGUINAL HERNIA REPAIR     UPPER GASTROINTESTINAL ENDOSCOPY  SEP 2009   GASTRIC AVM ABLATED, NL DUO Bx   Family History Family History  Problem Relation Age of Onset   Heart attack Mother    Heart disease Mother    Hypertension Mother     Social History Social History   Tobacco Use   Smoking status: Every Day    Packs/day: 1.00    Years: 60.00    Total pack years: 60.00    Types: Cigarettes    Start date: 01/02/1949    Passive exposure: Current   Smokeless tobacco: Never   Tobacco comments:    1 pack a day    Smoking Cessation Offered  Vaping Use   Vaping Use: Never used  Substance Use Topics   Alcohol use: No    Alcohol/week: 0.0 standard drinks of alcohol    Comment: No etoh in 12 yrs.   Drug use: Yes    Types: Marijuana    Comment: last use 10/25/19   Allergies Penicillins  Review of Systems Review of Systems  Unable to perform ROS: Mental status change    Physical Exam Vital Signs  I have reviewed the triage vital signs BP 113/66   Pulse 76   Temp (!) 94.6 F (34.8 C) (Rectal)   Resp 13   SpO2 100%   Physical Exam Constitutional:      General: He is not in acute distress.    Appearance: Normal appearance.  HENT:     Head: Normocephalic and atraumatic.     Nose: No congestion or rhinorrhea.  Eyes:     General:        Right eye: No discharge.        Left eye: No discharge.     Extraocular Movements: Extraocular movements intact.     Pupils: Pupils are equal, round, and  reactive to light.  Cardiovascular:     Rate and Rhythm: Normal rate and regular rhythm.     Heart sounds: No murmur heard. Pulmonary:     Effort: No respiratory distress.     Breath sounds: No wheezing or rales.  Abdominal:     General: There is distension.     Tenderness: There is abdominal tenderness.  Musculoskeletal:        General: Normal range of motion.     Cervical back: Normal range of motion.  Skin:    General: Skin is warm and dry.  Neurological:     General: No focal deficit present.     Mental Status: He is alert.     ED Results and Treatments Labs (all labs ordered are listed, but only abnormal results are displayed) Labs Reviewed  CBC WITH DIFFERENTIAL/PLATELET - Abnormal; Notable for the following components:      Result Value   RBC 2.34 (*)    Hemoglobin 7.1 (*)    HCT 25.0 (*)    MCV 106.8 (*)    MCHC 28.4 (*)    RDW 18.3 (*)    All other components within normal limits  COMPREHENSIVE METABOLIC PANEL - Abnormal; Notable for the following components:   Chloride 97 (*)    Glucose, Bld 104 (*)    BUN 63 (*)    Creatinine, Ser 2.98 (*)    Total Protein 6.4 (*)    Albumin 3.0 (*)    GFR, Estimated 21 (*)    All other components within normal limits  BLOOD GAS, VENOUS - Abnormal; Notable for the following components:   pCO2, Ven 64 (*)    pO2, Ven <31 (*)    Bicarbonate 34.9 (*)    Acid-Base Excess 7.4 (*)    All other components within normal limits  CBG MONITORING, ED - Abnormal; Notable for the following components:   Glucose-Capillary 105 (*)    All other components within normal limits  CBG MONITORING, ED - Abnormal; Notable for the following components:   Glucose-Capillary 106 (*)    All other components within normal limits  CULTURE, BLOOD (ROUTINE X 2)  CULTURE, BLOOD (ROUTINE X 2)  LIPASE, BLOOD  LACTIC ACID, PLASMA  URINALYSIS, ROUTINE W REFLEX MICROSCOPIC  LACTIC ACID, PLASMA  BRAIN NATRIURETIC PEPTIDE                                                                                                                           Radiology CT ABDOMEN PELVIS WO CONTRAST  Result Date: 04/11/2022 CLINICAL DATA:  Abdominal  pain. Altered mental status. Recent diagnosis of pneumonia. EXAM: CT ABDOMEN AND PELVIS WITHOUT CONTRAST TECHNIQUE: Multidetector CT imaging of the abdomen and pelvis was performed following the standard protocol without IV contrast. RADIATION DOSE REDUCTION: This exam was performed according to the departmental dose-optimization program which includes automated exposure control, adjustment of the mA and/or kV according to patient size and/or use of iterative reconstruction technique. COMPARISON:  07/22/2008. FINDINGS: Lower chest: Heart mildly enlarged. Dense three-vessel coronary artery calcifications. Previous CABG surgery. No pericardial effusion. Small left and trace right pleural effusions. Calcified pleural plaque posteriorly at the right lung base. Left lung base, predominantly lower lobe, atelectasis. Bilateral interstitial thickening with intervening hazy airspace opacities suspected to be pulmonary edema. Hepatobiliary: Normal liver. Dependent gallstone. No findings of acute cholecystitis. No bile duct dilation. Pancreas: Unremarkable. No pancreatic ductal dilatation or surrounding inflammatory changes. Spleen: Normal in size without focal abnormality. Adrenals/Urinary Tract: 1.1 cm low-attenuation left adrenal mass, average Hounsfield units of 9, consistent with an adenoma, with without significant change. No follow-up recommended. Normal right adrenal gland. Bilateral renal cortical thinning. A few small low-attenuation renal masses consistent with cysts. No follow-up recommended. Nonobstructing stone in the midpole the right kidney. No hydronephrosis. Ureters normal in course and in caliber. Bladder is unremarkable. Stomach/Bowel: Stomach is within normal limits. Appendix appears normal. No evidence of bowel wall  thickening, distention, or inflammatory changes. Vascular/Lymphatic: Dense aortic atherosclerotic calcifications. No aneurysm. No enlarged lymph nodes. Reproductive: Unremarkable. Other: Small amount of ascites.  Diffuse subcutaneous edema. Musculoskeletal: No fracture or acute finding.  No bone lesion. IMPRESSION: 1. Lung base findings consistent with congestive heart failure with pulmonary edema, small left and trace right pleural effusions. Additional left lung base opacity is consistent with atelectasis. A component of pneumonia is not excluded, however. 2. Small amount of ascites and diffuse subcutaneous edema consistent with anasarca. 3. No convincing acute abnormality within the abdomen or pelvis. 4. Cholelithiasis. Small nonobstructing stone the right kidney. Dense aortic atherosclerotic calcifications. Electronically Signed   By: Lajean Manes M.D.   On: 03/29/2022 13:21    Pertinent labs & imaging results that were available during my care of the patient were reviewed by me and considered in my medical decision making (see MDM for details).  Medications Ordered in ED Medications  furosemide (LASIX) injection 40 mg (has no administration in time range)  lactated ringers bolus 1,000 mL (1,000 mLs Intravenous New Bag/Given 04/01/2022 1225)  morphine (PF) 4 MG/ML injection 4 mg (4 mg Intravenous Given 04/01/2022 1226)  ondansetron (ZOFRAN) injection 4 mg (4 mg Intravenous Given 04/22/2022 1226)                                                                                                                                     Procedures .Critical Care  Performed by: Teressa Lower, MD Authorized by: Teressa Lower, MD   Critical care provider statement:    Critical  care time (minutes):  30   Critical care was necessary to treat or prevent imminent or life-threatening deterioration of the following conditions:  Respiratory failure (Hypothermia requiring Bair hugger)   Critical care was time spent  personally by me on the following activities:  Development of treatment plan with patient or surrogate, discussions with consultants, evaluation of patient's response to treatment, examination of patient, ordering and review of laboratory studies, ordering and review of radiographic studies, ordering and performing treatments and interventions, pulse oximetry, re-evaluation of patient's condition and review of old charts   (including critical care time)  Medical Decision Making / ED Course   This patient presents to the ED for concern of abdominal pain, altered mental status, this involves an extensive number of treatment options, and is a complaint that carries with it a high risk of complications and morbidity.  The differential diagnosis includes obstruction, GI bleed, C. difficile, intra-abdominal infection, CHF exacerbation  MDM: Patient seen emergency room for evaluation multiple complaints described above.  Physical exam with abdominal distention and tenderness to palpation in the right and left lower quadrants.  Laboratory evaluation with a hemoglobin of 7.1 with an MCV of 106.8 which is down 2 g from 4 months ago.  BUN 63, creatinine 2.98 which is largely unchanged from baseline.  Lactic acid normal and pH 7.3 with a pCO2 of 64.  BNP elevated to 901 which is an elevation from baseline.  For patient's hypothermia he was started on a Bair hugger and warmed fluids were initiated.  CT abdomen pelvis with small left and trace right pleural effusions and atelectasis as well as small amount of ascites with diffuse subcutaneous edema consistent with anasarca.  With new oxygen requirement and elevated BNP, Lasix initiated.  Additional fluids were withheld to facilitate appropriate diuresis.  Morphine was given for belly pain.  On reevaluation, patient more altered than normal and follow-up VBG and CT head were obtained that are both reassuringly unremarkable.  Patient remains persistently altered and  hypothermic on Bair hugger and will require hospitalization for altered mental status, new oxygen requirement and diuresis.   Additional history obtained:  -External records from outside source obtained and reviewed including: Chart review including previous notes, labs, imaging, consultation notes   Lab Tests: -I ordered, reviewed, and interpreted labs.   The pertinent results include:   Labs Reviewed  CBC WITH DIFFERENTIAL/PLATELET - Abnormal; Notable for the following components:      Result Value   RBC 2.34 (*)    Hemoglobin 7.1 (*)    HCT 25.0 (*)    MCV 106.8 (*)    MCHC 28.4 (*)    RDW 18.3 (*)    All other components within normal limits  COMPREHENSIVE METABOLIC PANEL - Abnormal; Notable for the following components:   Chloride 97 (*)    Glucose, Bld 104 (*)    BUN 63 (*)    Creatinine, Ser 2.98 (*)    Total Protein 6.4 (*)    Albumin 3.0 (*)    GFR, Estimated 21 (*)    All other components within normal limits  BLOOD GAS, VENOUS - Abnormal; Notable for the following components:   pCO2, Ven 64 (*)    pO2, Ven <31 (*)    Bicarbonate 34.9 (*)    Acid-Base Excess 7.4 (*)    All other components within normal limits  CBG MONITORING, ED - Abnormal; Notable for the following components:   Glucose-Capillary 105 (*)    All other components  within normal limits  CBG MONITORING, ED - Abnormal; Notable for the following components:   Glucose-Capillary 106 (*)    All other components within normal limits  CULTURE, BLOOD (ROUTINE X 2)  CULTURE, BLOOD (ROUTINE X 2)  LIPASE, BLOOD  LACTIC ACID, PLASMA  URINALYSIS, ROUTINE W REFLEX MICROSCOPIC  LACTIC ACID, PLASMA  BRAIN NATRIURETIC PEPTIDE      Imaging Studies ordered: I ordered imaging studies including CT head, CT [facility] I independently visualized and interpreted imaging. I agree with the radiologist interpretation   Medicines ordered and prescription drug management: Meds ordered this encounter  Medications    lactated ringers bolus 1,000 mL   morphine (PF) 4 MG/ML injection 4 mg   ondansetron (ZOFRAN) injection 4 mg   furosemide (LASIX) injection 40 mg    -I have reviewed the patients home medicines and have made adjustments as needed  Critical interventions Oxygen supplementation, diuresis  Cardiac Monitoring: The patient was maintained on a cardiac monitor.  I personally viewed and interpreted the cardiac monitored which showed an underlying rhythm of: NSR  Social Determinants of Health:  Factors impacting patients care include: none   Reevaluation: After the interventions noted above, I reevaluated the patient and found that they have :stayed the same  Co morbidities that complicate the patient evaluation  Past Medical History:  Diagnosis Date   Arteriovenous malformation small bowel    Capsule study 3/10   Bilateral carotid artery stenosis    BPH (benign prostatic hypertrophy)    Candida esophagitis (HCC)    Cataract    Chronic anemia    Chronic diarrhea    COPD (chronic obstructive pulmonary disease) (HCC)    Coronary atherosclerosis of native coronary artery    Multivessel, BMS SVG TO RCA 2000, reportedly  "2" additional stents in interim   Dementia ([place])    Depression    Diabetes mellitus, type 2 ([place])    Essential hypertension    GERD (gastroesophageal reflux disease)    GI bleeding    Cecal ulcers, arteriovenous malformations   Glaucoma    Hemorrhoids    Hyperlipidemia    Myocardial infarction ([place])    1994   Portal hypertensive gastropathy ([place]) 11/08/2012   EGD. Dr. Laural Golden   Restrictive lung disease    Sleep apnea    TIA (transient ischemic attack)    2005      Dispostion: I considered admission for this patient, and due to persistent altered mental status, hypothermia and new oxygen requirement, patient will require hospitalization     Final Clinical Impression(s) / ED Diagnoses Final diagnoses:  None     '@PCDICTATION'$ @    Teressa Lower,  MD 03/25/2022 1517

## 2022-03-31 ENCOUNTER — Inpatient Hospital Stay (HOSPITAL_COMMUNITY): Payer: Medicare HMO

## 2022-03-31 ENCOUNTER — Other Ambulatory Visit (HOSPITAL_COMMUNITY): Payer: Self-pay | Admitting: *Deleted

## 2022-03-31 DIAGNOSIS — I10 Essential (primary) hypertension: Secondary | ICD-10-CM

## 2022-03-31 DIAGNOSIS — E1122 Type 2 diabetes mellitus with diabetic chronic kidney disease: Secondary | ICD-10-CM

## 2022-03-31 DIAGNOSIS — J449 Chronic obstructive pulmonary disease, unspecified: Secondary | ICD-10-CM

## 2022-03-31 DIAGNOSIS — N184 Chronic kidney disease, stage 4 (severe): Secondary | ICD-10-CM | POA: Diagnosis not present

## 2022-03-31 DIAGNOSIS — K922 Gastrointestinal hemorrhage, unspecified: Secondary | ICD-10-CM | POA: Diagnosis not present

## 2022-03-31 DIAGNOSIS — I5033 Acute on chronic diastolic (congestive) heart failure: Secondary | ICD-10-CM | POA: Diagnosis not present

## 2022-03-31 LAB — CBC
HCT: 25.3 % — ABNORMAL LOW (ref 39.0–52.0)
Hemoglobin: 7.1 g/dL — ABNORMAL LOW (ref 13.0–17.0)
MCH: 30.6 pg (ref 26.0–34.0)
MCHC: 28.1 g/dL — ABNORMAL LOW (ref 30.0–36.0)
MCV: 109.1 fL — ABNORMAL HIGH (ref 80.0–100.0)
Platelets: 271 10*3/uL (ref 150–400)
RBC: 2.32 MIL/uL — ABNORMAL LOW (ref 4.22–5.81)
RDW: 18.2 % — ABNORMAL HIGH (ref 11.5–15.5)
WBC: 8.1 10*3/uL (ref 4.0–10.5)
nRBC: 0 % (ref 0.0–0.2)

## 2022-03-31 LAB — BASIC METABOLIC PANEL
Anion gap: 13 (ref 5–15)
BUN: 62 mg/dL — ABNORMAL HIGH (ref 8–23)
CO2: 29 mmol/L (ref 22–32)
Calcium: 8.9 mg/dL (ref 8.9–10.3)
Chloride: 100 mmol/L (ref 98–111)
Creatinine, Ser: 3.28 mg/dL — ABNORMAL HIGH (ref 0.61–1.24)
GFR, Estimated: 18 mL/min — ABNORMAL LOW (ref >60)
Glucose, Bld: 102 mg/dL — ABNORMAL HIGH (ref 70–99)
Potassium: 4.2 mmol/L (ref 3.5–5.1)
Sodium: 142 mmol/L (ref 135–145)

## 2022-03-31 LAB — CBG MONITORING, ED: Glucose-Capillary: 114 mg/dL — ABNORMAL HIGH (ref 70–99)

## 2022-03-31 LAB — MRSA NEXT GEN BY PCR, NASAL: MRSA by PCR Next Gen: NOT DETECTED

## 2022-03-31 LAB — PROCALCITONIN: Procalcitonin: 0.17 ng/mL

## 2022-03-31 LAB — GLUCOSE, CAPILLARY: Glucose-Capillary: 114 mg/dL — ABNORMAL HIGH (ref 70–99)

## 2022-03-31 MED ORDER — CHLORHEXIDINE GLUCONATE CLOTH 2 % EX PADS
6.0000 | MEDICATED_PAD | Freq: Every day | CUTANEOUS | Status: DC
  Administered 2022-03-31 – 2022-04-04 (×4): 6 via TOPICAL

## 2022-03-31 MED ORDER — OCTREOTIDE ACETATE 100 MCG/ML IJ SOLN
100.0000 ug | Freq: Two times a day (BID) | INTRAMUSCULAR | Status: DC
  Administered 2022-03-31 – 2022-04-04 (×8): 100 ug via SUBCUTANEOUS
  Filled 2022-03-31 (×11): qty 1

## 2022-03-31 MED ORDER — PERFLUTREN LIPID MICROSPHERE
1.0000 mL | INTRAVENOUS | Status: AC | PRN
  Administered 2022-03-31: 3 mL via INTRAVENOUS

## 2022-03-31 MED ORDER — METOPROLOL TARTRATE 5 MG/5ML IV SOLN
2.5000 mg | Freq: Three times a day (TID) | INTRAVENOUS | Status: DC
  Administered 2022-03-31 – 2022-04-03 (×8): 2.5 mg via INTRAVENOUS
  Filled 2022-03-31 (×9): qty 5

## 2022-03-31 MED ORDER — HYDROMORPHONE HCL 1 MG/ML IJ SOLN: 0.5000 mg | INTRAMUSCULAR | Status: DC | PRN

## 2022-03-31 MED ORDER — FUROSEMIDE 10 MG/ML IJ SOLN
40.0000 mg | Freq: Every day | INTRAMUSCULAR | Status: DC
  Administered 2022-04-01 – 2022-04-03 (×2): 40 mg via INTRAVENOUS
  Filled 2022-03-31 (×2): qty 4

## 2022-03-31 NOTE — Progress Notes (Addendum)
Gastroenterology Progress Note    Primary Care Physician:  Celene Squibb, MD Primary Gastroenterologist:  Dr. Jenetta Downer  Patient ID: Donald Gaines; [id number]; 11/23/1940    Subjective   Resting in bed with eyes closed. Awakens to verbal stimuli but unable to provide meaningful history. Denies abdominal pain, N/V. No overt GI bleeding per nursing staff.    Objective   Vital signs in last 24 hours Temp:  [95.4 F (35.2 C)-98.1 F (36.7 C)] 97.7 F (36.5 C) (01/09 1130) Pulse Rate:  [85-134] 124 (01/09 1130) Resp:  [10-26] 23 (01/09 1130) BP: (92-125)/(53-108) 120/74 (01/09 1130) SpO2:  [90 %-100 %] 96 % (01/09 1130) Weight:  [77.1 kg] 77.1 kg (01/08 1541)    Physical Exam General:   Resting with eyes closed, awakens to verbal stimuli. Alert to person and year.  Head:  Normocephalic and atraumatic. Abdomen:  Bowel sounds present, distended but soft, bilateral flank anasarca  Msk:  Symmetrical without gross deformities. Normal posture. Neurologic:  Alert and  oriented to person and year only   Intake/Output from previous day: 01/08 0701 - 01/09 0700 In: -  Out: 300 [Urine:300] Intake/Output this shift: No intake/output data recorded.  Lab Results  Recent Labs    03/24/2022 1203 03/31/22 0453  WBC 6.1 8.1  HGB 7.1* 7.1*  HCT 25.0* 25.3*  PLT 268 271   BMET Recent Labs    03/25/2022 1203 03/31/22 0453  NA 139 142  K 3.7 4.2  CL 97* 100  CO2 30 29  GLUCOSE 104* 102*  BUN 63* 62*  CREATININE 2.98* 3.28*  CALCIUM 9.2 8.9   LFT Recent Labs    04/05/2022 1203  PROT 6.4*  ALBUMIN 3.0*  AST 18  ALT 10  ALKPHOS 74  BILITOT 0.7     Studies/Results DG CHEST PORT 1 VIEW  Result Date: 03/27/2022 CLINICAL DATA:  Diastolic heart failure. Abdominal pain. Altered mental status EXAM: PORTABLE CHEST 1 VIEW COMPARISON:  Two-view chest x-ray 11/05/2021 and 01/10/2022. Numerous other exams as well. FINDINGS: Enlarged cardiopericardial silhouette with sternal  wires. Increasing vascular congestion and interstitial changes. Moderate left effusion. No pneumothorax. Question tiny right effusion. Overlapping cardiac leads. IMPRESSION: Increasing vascular congestion and edema. Electronically Signed   By: Jill Side M.D.   On: 04/20/2022 17:40   CT Head Wo Contrast  Result Date: 04/08/2022 CLINICAL DATA:  Delirium.  Altered mental status. EXAM: CT HEAD WITHOUT CONTRAST TECHNIQUE: Contiguous axial images were obtained from the base of the skull through the vertex without intravenous contrast. RADIATION DOSE REDUCTION: This exam was performed according to the departmental dose-optimization program which includes automated exposure control, adjustment of the mA and/or kV according to patient size and/or use of iterative reconstruction technique. COMPARISON:  December 25, 2014. FINDINGS: Brain: No evidence of acute large vascular territory infarction, hemorrhage, hydrocephalus, extra-axial collection or mass lesion/mass effect. Patchy white matter hypodensities are nonspecific but compatible with chronic microvascular ischemic disease. Vascular: No hyperdense vessel.  Calcific atherosclerosis. Skull: No acute fracture. Sinuses/Orbits: Mild paranasal sinus mucosal thickening. Remote left medial orbital wall fracture. Other: No mastoid effusions. IMPRESSION: No evidence of acute intracranial abnormality. Electronically Signed   By: Margaretha Sheffield M.D.   On: 04/07/2022 14:48   CT ABDOMEN PELVIS WO CONTRAST  Result Date: 04/22/2022 CLINICAL DATA:  Abdominal pain. Altered mental status. Recent diagnosis of pneumonia. EXAM: CT ABDOMEN AND PELVIS WITHOUT CONTRAST TECHNIQUE: Multidetector CT imaging of the abdomen and pelvis was performed following the standard protocol without  IV contrast. RADIATION DOSE REDUCTION: This exam was performed according to the departmental dose-optimization program which includes automated exposure control, adjustment of the mA and/or kV according to  patient size and/or use of iterative reconstruction technique. COMPARISON:  07/22/2008. FINDINGS: Lower chest: Heart mildly enlarged. Dense three-vessel coronary artery calcifications. Previous CABG surgery. No pericardial effusion. Small left and trace right pleural effusions. Calcified pleural plaque posteriorly at the right lung base. Left lung base, predominantly lower lobe, atelectasis. Bilateral interstitial thickening with intervening hazy airspace opacities suspected to be pulmonary edema. Hepatobiliary: Normal liver. Dependent gallstone. No findings of acute cholecystitis. No bile duct dilation. Pancreas: Unremarkable. No pancreatic ductal dilatation or surrounding inflammatory changes. Spleen: Normal in size without focal abnormality. Adrenals/Urinary Tract: 1.1 cm low-attenuation left adrenal mass, average Hounsfield units of 9, consistent with an adenoma, with without significant change. No follow-up recommended. Normal right adrenal gland. Bilateral renal cortical thinning. A few small low-attenuation renal masses consistent with cysts. No follow-up recommended. Nonobstructing stone in the midpole the right kidney. No hydronephrosis. Ureters normal in course and in caliber. Bladder is unremarkable. Stomach/Bowel: Stomach is within normal limits. Appendix appears normal. No evidence of bowel wall thickening, distention, or inflammatory changes. Vascular/Lymphatic: Dense aortic atherosclerotic calcifications. No aneurysm. No enlarged lymph nodes. Reproductive: Unremarkable. Other: Small amount of ascites.  Diffuse subcutaneous edema. Musculoskeletal: No fracture or acute finding.  No bone lesion. IMPRESSION: 1. Lung base findings consistent with congestive heart failure with pulmonary edema, small left and trace right pleural effusions. Additional left lung base opacity is consistent with atelectasis. A component of pneumonia is not excluded, however. 2. Small amount of ascites and diffuse subcutaneous  edema consistent with anasarca. 3. No convincing acute abnormality within the abdomen or pelvis. 4. Cholelithiasis. Small nonobstructing stone the right kidney. Dense aortic atherosclerotic calcifications. Electronically Signed   By: Lajean Manes M.D.   On: 04/06/2022 13:21    Lab Results  Component Value Date   FERRITIN 20 (L) 03/27/2022     Assessment  82 y.o. male with a history of  IDA due to chronic GI blood loss in setting of gastric and small bowel AVMS, COPD, DM, HTN, TIA, Afib not anticoagulated due to recurrent GI bleeding, CAD, presenting with hypothermia and hypoxia on room air, altered mental status, . GI consulted yesterday as Hgb 7.1, down from 9 range in Aug 2023 when last hospitalized. Admitted with acute on chronic heart failure, acute metabolic encephalopathy, blood cultures negative thus far, CT as above without acute abnormality.   Worsening anemia: Hgb 7.1 on admission and remains stable this morning. No overt GI bleeding. Multifactorial in setting of acute illness, known AVMs. Ferritin low at 20 this admission. Needs non-urgent EGD in future for duodenal polyp removal. Recommend as outpatient once acute illness resolved. May need to revisit colonoscopy as well as last was in 2014.     Plan / Recommendations  PPI daily Monitor for overt GI bleeding Outpatient EGD with push enteroscopy for duodenal polyp removal. Consider colonoscopy at same time due to IDA Once mental status improves, may have oral intake.     LOS: 1 day    03/31/2022, 1:02 PM  Annitta Needs, PhD, [facility] Gastroenterology    Addendum: discussed with Dr. Jenetta Downer. Will start Octreotide 100 mcg subcutaneously BID due to known AVMs.  Annitta Needs, PhD, ANP-BC [facility] Gastroenterology

## 2022-03-31 NOTE — Progress Notes (Signed)
*  PRELIMINARY RESULTS* Echocardiogram 2D Echocardiogram has been performed with Definity.  Samuel Germany 03/31/2022, 9:36 AM

## 2022-03-31 NOTE — ED Notes (Signed)
ED TO INPATIENT HANDOFF REPORT  ED Nurse Name and Phone #: [phone]  S Name/Age/Gender Donald Gaines 82 y.o. male Room/Bed: APA19/APA19  Code Status   Code Status: Full Code  Home/SNF/Other Nursing Home  Is this baseline? Yes   Triage Complete: Triage complete  Chief Complaint Acute on chronic diastolic (congestive) heart failure (HCC) [I50.33]  Triage Note Pt brought in by [facility] for c/o ams and abdominal pain;  pt was diagnosed with pneumonia last week and has been taking an antibiotic   Pt c/o increased abdominal pain with abdominal distention   Allergies Allergies  Allergen Reactions   Penicillins Rash    Has patient had a PCN reaction causing immediate rash, facial/tongue/throat swelling, SOB or lightheadedness with hypotension: Yes Has patient had a PCN reaction causing severe rash involving mucus membranes or skin necrosis: No Has patient had a PCN reaction that required hospitalization No Has patient had a PCN reaction occurring within the last 10 years: No If all of the above answers are "NO", then may proceed with Cephalosporin use.     Level of Care/Admitting Diagnosis ED Disposition     ED Disposition  Admit   Condition  --   [place]: [facility] [[id number]]  Level of Care: Stepdown [14]  Covid Evaluation: Asymptomatic - no recent exposure (last 10 days) testing not required  Diagnosis: Acute on chronic diastolic (congestive) heart failure [facility]) [[id number]]  Admitting Physician: Bethena Roys [phone]  Attending Physician: Bethena Roys [[id number]]  Certification:: I certify this patient will need inpatient services for at least 2 midnights  Estimated Length of Stay: 2          B Medical/Surgery History Past Medical History:  Diagnosis Date   Arteriovenous malformation small bowel    Capsule study 3/10   Bilateral carotid artery stenosis    BPH (benign prostatic hypertrophy)    Candida esophagitis (HCC)     Cataract    Chronic anemia    Chronic diarrhea    COPD (chronic obstructive pulmonary disease) ([place])    Coronary atherosclerosis of native coronary artery    Multivessel, BMS SVG TO RCA 2000, reportedly  "2" additional stents in interim   Dementia ([place])    Depression    Diabetes mellitus, type 2 ([place])    Essential hypertension    GERD (gastroesophageal reflux disease)    GI bleeding    Cecal ulcers, arteriovenous malformations   Glaucoma    Hemorrhoids    Hyperlipidemia    Myocardial infarction ([place])    1994   Portal hypertensive gastropathy ([place]) 11/08/2012   EGD. Dr. Laural Golden   Restrictive lung disease    Sleep apnea    TIA (transient ischemic attack)    2005   Past Surgical History:  Procedure Laterality Date   CAPSULE ENDO  03/10   cardiac catherization  04/2012   CARDIAC CATHETERIZATION  04/05/1996 [name]   EF 45%, occluded SVG to circumflex, patent SVG to D1 and PDA and patent LIMA to LAD with severe native vessel disease.   CARDIAC CATHETERIZATION  08/22/1998 [name]   Mild LM, severe LAD,severe CX, occlude RCA, patient  SVG to diatal RCA, patent vein graft to D1 and patent LIMA to LAD.    CATARACT EXTRACTION W/PHACO Right 09/22/2012   Procedure: CATARACT EXTRACTION PHACO AND INTRAOCULAR LENS PLACEMENT (IOC);  Surgeon: Tonny Branch, MD;  Location: [facility] ORS;  Service: Ophthalmology;  Laterality: Right;  CDE: 11.43   CATARACT EXTRACTION  W/PHACO Left 10/17/2012   Procedure: CATARACT EXTRACTION PHACO AND INTRAOCULAR LENS PLACEMENT (IOC);  Surgeon: Tonny Branch, MD;  Location: [facility] ORS;  Service: Ophthalmology;  Laterality: Left;  CDE: 8.06   COLONOSCOPY  APR 2008   SIMPLE ADENOMA, [place] TICS, IH   COLONOSCOPY  FEB 2008 ANEMIA. MELENA    2 LARGE ILEOCEAL ULCERS 2o to ASA, [place] TICS, IH   COLONOSCOPY  SEP 2009 TRANSFUSION DEP ANEMIA   AC AVM-ABLATED, [place] TICS, IH   COLONOSCOPY WITH ESOPHAGOGASTRODUODENOSCOPY (EGD) N/A 11/08/2012   Procedure: COLONOSCOPY WITH  ESOPHAGOGASTRODUODENOSCOPY (EGD);  Surgeon: Rogene Houston, MD;  Location: [facility] ENDO SUITE;  Service: Endoscopy;  Laterality: N/A;  250-rescheduled to 7:30 Ann to notify pt   CORONARY ANGIOPLASTY WITH STENT PLACEMENT  08/22/1998 [name]   TEC stenting of the SVG to RCA-Last seen by cardiologist in 2010.   CORONARY ARTERY BYPASS GRAFT  1995-triple bypass   LIMA-LAD, LIMA to intermediate branch, SVG to PD and second OM,   ENTEROSCOPY N/A 11/07/2021   Procedure: ENTEROSCOPY;  Surgeon: Harvel Quale, MD;  Location: [facility] END[facility] SU[facility];  Service: Gastroenterology;  Laterality: N/A;  PUSH ENTEROSCOPY   ESOPHAGOGASTRODUODENOSCOPY  SEP 09   DUODENAL LIPOMA   ESOPHAGOGASTRODUODENOSCOPY N/A 09/27/2013   Procedure: ESOPHAGOGASTRODUODENOSCOPY (EGD) Push enteroscopy;  Surgeon: Rogene Houston, MD;  Location: [facility];  Service: Endoscopy;  Laterality: N/A;   ESOPHAGOGASTRODUODENOSCOPY (EGD) WITH PROPOFOL N/A 11/06/2021   Procedure: ESOPHAGOGASTRODUODENOSCOPY (EGD) WITH PROPOFOL;  Surgeon: Eloise Harman, DO;  Location: [facility];  Service: Endoscopy;  Laterality: N/A;   GIVENS CAPSULE STUDY N/A 11/24/2012   Procedure: GIVENS CAPSULE STUDY;  Surgeon: Rogene Houston, MD;  Location: [facility];  Service: Endoscopy;  Laterality: N/A;  [place] 11/06/2021   Procedure: GIVENS CAPSULE STUDY;  Surgeon: Eloise Harman, DO;  Location: [facility];  Service: Endoscopy;  Laterality: N/A;   HEMOSTASIS CLIP PLACEMENT  11/07/2021   Procedure: HEMOSTASIS CLIP PLACEMENT;  Surgeon: Harvel Quale, MD;  Location: [facility];  Service: Gastroenterology;;   HOT HEMOSTASIS  09/27/2013   Procedure: HOT HEMOSTASIS (ARGON PLASMA COAGULATION/BICAP);  Surgeon: Rogene Houston, MD;  Location: [facility];  Service: Endoscopy;;   HOT HEMOSTASIS  11/07/2021   Procedure: HOT HEMOSTASIS (ARGON PLASMA COAGULATION/BICAP);  Surgeon: Montez Morita, Quillian Quince, MD;  Location: [facility] END[facility]  SU[facility];  Service: Gastroenterology;;   HYDROGEN BREATH TEST  2009   RIGHT INGUINAL HERNIA REPAIR     UPPER GASTROINTESTINAL ENDOSCOPY  SEP 2009   GASTRIC AVM ABLATED, NL DUO Bx     A IV Location/Drains/Wounds Patient Lines/Drains/Airways Status     Active Line/Drains/Airways     Name Placement date Placement time Site Days   Peripheral IV 03/25/2022 20 G 1" Left;Posterior Forearm 04/01/2022  1203  Forearm  1   Urethral Catheter Deanna Fowler NT Temperature probe 16 Fr. 04/13/2022  1409  Temperature probe  1            Intake/Output Last 24 hours  Intake/Output Summary (Last 24 hours) at 03/31/2022 1314 Last data filed at 03/31/2022 0228 Gross per 24 hour  Intake --  Output 300 ml  Net -300 ml    Labs/Imaging Results for orders placed or performed during the hospital encounter of 03/29/2022 (from the past 48 hour(s))  Urinalysis, Routine w reflex microscopic Urine, Catheterized     Status: Abnormal   Collection Time: 04/13/2022 10:14 AM  Result Value  Ref Range   Color, Urine YELLOW YELLOW   APPearance HAZY (A) CLEAR   Specific Gravity, Urine 1.011 1.005 - 1.030   pH 5.0 5.0 - 8.0   Glucose, UA NEGATIVE NEGATIVE mg/dL   Hgb urine dipstick SMALL (A) NEGATIVE   Bilirubin Urine NEGATIVE NEGATIVE   Ketones, ur NEGATIVE NEGATIVE mg/dL   Protein, ur 30 (A) NEGATIVE mg/dL   Nitrite NEGATIVE NEGATIVE   Leukocytes,Ua NEGATIVE NEGATIVE   RBC / HPF 0-5 0 - 5 RBC/hpf   WBC, UA 0-5 0 - 5 WBC/hpf   Bacteria, UA NONE SEEN NONE SEEN   Squamous Epithelial / HPF 0-5 0 - 5 /HPF   Hyaline Casts, UA PRESENT     Comment: Performed at [facility], [address], [place], [place] [address]  CBG monitoring, ED     Status: Abnormal   Collection Time: 03/27/2022 11:54 AM  Result Value Ref Range   Glucose-Capillary 105 (H) 70 - 99 mg/dL    Comment: Glucose reference range applies only to samples taken after fasting for at least 8 hours.  CBC with Differential     Status: Abnormal   Collection Time:  04/09/2022 12:03 PM  Result Value Ref Range   WBC 6.1 4.0 - 10.5 K/uL   RBC 2.34 (L) 4.22 - 5.81 MIL/uL   Hemoglobin 7.1 (L) 13.0 - 17.0 g/dL   HCT 25.0 (L) 39.0 - 52.0 %   MCV 106.8 (H) 80.0 - 100.0 fL   MCH 30.3 26.0 - 34.0 pg   MCHC 28.4 (L) 30.0 - 36.0 g/dL   RDW 18.3 (H) 11.5 - 15.5 %   Platelets 268 150 - 400 K/uL   nRBC 0.0 0.0 - 0.2 %   Neutrophils Relative % 71 %   Neutro Abs 4.3 1.7 - 7.7 K/uL   Lymphocytes Relative 13 %   Lymphs Abs 0.8 0.7 - 4.0 K/uL   Monocytes Relative 13 %   Monocytes Absolute 0.8 0.1 - 1.0 K/uL   Eosinophils Relative 2 %   Eosinophils Absolute 0.1 0.0 - 0.5 K/uL   Basophils Relative 1 %   Basophils Absolute 0.1 0.0 - 0.1 K/uL   Immature Granulocytes 0 %   Abs Immature Granulocytes 0.02 0.00 - 0.07 K/uL    Comment: Performed at [facility], [address], [place], [place] [address]  Comprehensive metabolic panel     Status: Abnormal   Collection Time: 03/26/2022 12:03 PM  Result Value Ref Range   Sodium 139 135 - 145 mmol/L   Potassium 3.7 3.5 - 5.1 mmol/L   Chloride 97 (L) 98 - 111 mmol/L   CO2 30 22 - 32 mmol/L   Glucose, Bld 104 (H) 70 - 99 mg/dL    Comment: Glucose reference range applies only to samples taken after fasting for at least 8 hours.   BUN 63 (H) 8 - 23 mg/dL   Creatinine, Ser 2.98 (H) 0.61 - 1.24 mg/dL   Calcium 9.2 8.9 - 10.3 mg/dL   Total Protein 6.4 (L) 6.5 - 8.1 g/dL   Albumin 3.0 (L) 3.5 - 5.0 g/dL   AST 18 15 - 41 U/L   ALT 10 0 - 44 U/L   Alkaline Phosphatase 74 38 - 126 U/L   Total Bilirubin 0.7 0.3 - 1.2 mg/dL   GFR, Estimated 21 (L) >60 mL/min    Comment: (NOTE) Calculated using the CKD-EPI Creatinine Equation (2021)    Anion gap 12 5 - 15    Comment: Performed  at [facility], [address], [place], [place] [address]  Lipase, blood     Status: None   Collection Time: 04/01/2022 12:03 PM  Result Value Ref Range   Lipase 33 11 - 51 U/L    Comment: Performed at [facility], [address] [place], [place]  [address]  Blood culture (routine x 2)     Status: None (Preliminary result)   Collection Time: 04/01/2022 12:03 PM   Specimen: BLOOD  Result Value Ref Range   Specimen Description BLOOD RIGHT ASSIST CONTROL    Special Requests      Blood Culture adequate volume BOTTLES DRAWN AEROBIC AND ANAEROBIC   Culture      NO GROWTH < 12 HOURS Performed at [facility], [address] [place], [place] [address]    Report Status PENDING   Blood culture (routine x 2)     Status: None (Preliminary result)   Collection Time: 04/06/2022 12:03 PM   Specimen: BLOOD  Result Value Ref Range   Specimen Description BLOOD LEFT ASSIST CONTROL    Special Requests      BOTTLES DRAWN AEROBIC AND ANAEROBIC Blood Culture results may not be optimal due to an inadequate volume of blood received in culture bottles   Culture      NO GROWTH < 12 HOURS Performed at [facility], [address], [place], [place] [address]    Report Status PENDING   Vitamin B12     Status: None   Collection Time: 04/21/2022 12:03 PM  Result Value Ref Range   Vitamin B-12 408 180 - 914 pg/mL    Comment: (NOTE) This assay is not validated for testing neonatal or myeloproliferative syndrome specimens for Vitamin B12 levels. Performed at [facility], [address], [place], [place] [address]   Folate     Status: None   Collection Time: 04/06/2022 12:03 PM  Result Value Ref Range   Folate 8.0 >5.9 ng/mL    Comment: Performed at [facility], [address], [place], [place] [address]  Iron and TIBC     Status: None   Collection Time: 04/01/2022 12:03 PM  Result Value Ref Range   Iron 133 45 - 182 ug/dL   TIBC 439 250 - 450 ug/dL   Saturation Ratios 30 17.9 - 39.5 %   UIBC 306 ug/dL    Comment: Performed at [facility], [address], [place], [place] [address]  Ferritin     Status: Abnormal   Collection Time: 03/25/2022 12:03 PM  Result Value Ref Range   Ferritin 20 (L) 24 - 336 ng/mL    Comment: Performed at [facility], [address] [place], [place] [address]  Reticulocytes     Status: Abnormal   Collection Time: 03/29/2022 12:03 PM  Result Value Ref Range   Retic Ct Pct 4.7 (H) 0.4 - 3.1 %   RBC. 2.21 (L) 4.22 - 5.81 MIL/uL   Retic Count, Absolute 102.8 19.0 - 186.0 K/uL   Immature Retic Fract 36.5 (H) 2.3 - 15.9 %    Comment: Performed at [facility], [address] [place], [place] [address]  TSH     Status: Abnormal   Collection Time: 04/11/2022 12:03 PM  Result Value Ref Range   TSH 15.160 (H) 0.350 - 4.500 uIU/mL    Comment: Performed by a 3rd Generation assay with a functional sensitivity of <=0.01 uIU/mL. Performed at [facility], [address], [place], [place] [address]   Procalcitonin - Baseline  Status: None   Collection Time: 04/03/2022 12:03 PM  Result Value Ref Range   Procalcitonin 0.21 ng/mL    Comment:        Interpretation: PCT (Procalcitonin) <= 0.5 ng/mL: Systemic infection (sepsis) is not likely. Local bacterial infection is possible. (NOTE)       Sepsis PCT Algorithm           Lower Respiratory Tract                                      Infection PCT Algorithm    ----------------------------     ----------------------------         PCT < 0.25 ng/mL                PCT < 0.10 ng/mL          Strongly encourage             Strongly discourage   discontinuation of antibiotics    initiation of antibiotics    ----------------------------     -----------------------------       PCT 0.25 - 0.50 ng/mL            PCT 0.10 - 0.25 ng/mL               OR       >80% decrease in PCT            Discourage initiation of                                            antibiotics      Encourage discontinuation           of antibiotics    ----------------------------     -----------------------------         PCT >= 0.50 ng/mL              PCT 0.26 - 0.50 ng/mL               AND        <80% decrease in PCT             Encourage initiation of                                             antibiotics        Encourage continuation           of antibiotics    ----------------------------     -----------------------------        PCT >= 0.50 ng/mL                  PCT > 0.50 ng/mL               AND         increase in PCT                  Strongly encourage                                      initiation of antibiotics    Strongly  encourage escalation           of antibiotics                                     -----------------------------                                           PCT <= 0.25 ng/mL                                                 OR                                        > 80% decrease in PCT                                      Discontinue / Do not initiate                                             antibiotics  Performed at [facility], [address], [place], [place] [address]   Lactic acid, plasma     Status: None   Collection Time: 04/03/2022 12:13 PM  Result Value Ref Range   Lactic Acid, Venous 1.2 0.5 - 1.9 mmol/L    Comment: Performed at [facility], [address] [place], [place] [address]  CBG monitoring, ED     Status: Abnormal   Collection Time: 04/13/2022  1:56 PM  Result Value Ref Range   Glucose-Capillary 106 (H) 70 - 99 mg/dL    Comment: Glucose reference range applies only to samples taken after fasting for at least 8 hours.  Blood gas, venous (at [facility] and [facility])     Status: Abnormal   Collection Time: 04/21/2022  1:58 PM  Result Value Ref Range   pH, Ven 7.33 7.25 - 7.43   pCO2, Ven 64 (H) 44 - 60 mmHg   pO2, Ven <31 (LL) 32 - 45 mmHg    Comment: CRITICAL RESULT CALLED TO, READ BACK BY AND VERIFIED WITH: MELANIE C, RN @ 1414 ON 04/06/2022 C VARNER    Bicarbonate 34.9 (H) 20.0 - 28.0 mmol/L   Acid-Base Excess 7.4 (H) 0.0 - 2.0 mmol/L   O2 Saturation 18.5 %   Patient temperature 34.8    Collection site LEFT ANTECUBITAL    Drawn by [id number]     Comment: Performed at [facility], [address] [place], [place] [address]  Brain natriuretic peptide     Status:  Abnormal   Collection Time: 04/03/2022  2:00 PM  Result Value Ref Range   B Natriuretic Peptide 901.0 (H) 0.0 - 100.0 pg/mL    Comment: Performed at [facility], [address] [place], [place] [address]  Troponin I (High Sensitivity)     Status: Abnormal   Collection Time: 04/01/2022  5:44 PM  Result Value Ref Range  Troponin I (High Sensitivity) 37 (H) <18 ng/L    Comment: (NOTE) Elevated high sensitivity troponin I (hsTnI) values and significant  changes across serial measurements may suggest ACS but many other  chronic and acute conditions are known to elevate hsTnI results.  Refer to the "Links" section for chest pain algorithms and additional  guidance. Performed at [facility], [address] [place], [place] [address]   Procalcitonin     Status: None   Collection Time: 03/31/22  4:53 AM  Result Value Ref Range   Procalcitonin 0.17 ng/mL    Comment:        Interpretation: PCT (Procalcitonin) <= 0.5 ng/mL: Systemic infection (sepsis) is not likely. Local bacterial infection is possible. (NOTE)       Sepsis PCT Algorithm           Lower Respiratory Tract                                      Infection PCT Algorithm    ----------------------------     ----------------------------         PCT < 0.25 ng/mL                PCT < 0.10 ng/mL          Strongly encourage             Strongly discourage   discontinuation of antibiotics    initiation of antibiotics    ----------------------------     -----------------------------       PCT 0.25 - 0.50 ng/mL            PCT 0.10 - 0.25 ng/mL               OR       >80% decrease in PCT            Discourage initiation of                                            antibiotics      Encourage discontinuation           of antibiotics    ----------------------------     -----------------------------         PCT >= 0.50 ng/mL              PCT 0.26 - 0.50 ng/mL               AND        <80% decrease in PCT             Encourage initiation  of                                             antibiotics       Encourage continuation           of antibiotics    ----------------------------     -----------------------------        PCT >= 0.50 ng/mL                  PCT > 0.50 ng/mL  AND         increase in PCT                  Strongly encourage                                      initiation of antibiotics    Strongly encourage escalation           of antibiotics                                     -----------------------------                                           PCT <= 0.25 ng/mL                                                 OR                                        > 80% decrease in PCT                                      Discontinue / Do not initiate                                             antibiotics  Performed at [facility], [address], [place], [place] [address]   Basic metabolic panel     Status: Abnormal   Collection Time: 03/31/22  4:53 AM  Result Value Ref Range   Sodium 142 135 - 145 mmol/L   Potassium 4.2 3.5 - 5.1 mmol/L   Chloride 100 98 - 111 mmol/L   CO2 29 22 - 32 mmol/L   Glucose, Bld 102 (H) 70 - 99 mg/dL    Comment: Glucose reference range applies only to samples taken after fasting for at least 8 hours.   BUN 62 (H) 8 - 23 mg/dL   Creatinine, Ser 3.28 (H) 0.61 - 1.24 mg/dL   Calcium 8.9 8.9 - 10.3 mg/dL   GFR, Estimated 18 (L) >60 mL/min    Comment: (NOTE) Calculated using the CKD-EPI Creatinine Equation (2021)    Anion gap 13 5 - 15    Comment: Performed at [facility], [address] [place], [place] [address]  CBC     Status: Abnormal   Collection Time: 03/31/22  4:53 AM  Result Value Ref Range   WBC 8.1 4.0 - 10.5 K/uL   RBC 2.32 (L) 4.22 - 5.81 MIL/uL   Hemoglobin 7.1 (L) 13.0 - 17.0 g/dL   HCT 25.3 (L) 39.0 - 52.0 %   MCV 109.1 (H) 80.0 - 100.0 fL   MCH 30.6 26.0 - 34.0 pg   MCHC 28.1 (L) 30.0 -  36.0 g/dL   RDW 18.2 (H) 11.5 - 15.5 %   Platelets 271  150 - 400 K/uL   nRBC 0.0 0.0 - 0.2 %    Comment: Performed at [facility], [address], [place], [place] [address]  CBG monitoring, ED     Status: Abnormal   Collection Time: 03/31/22  8:32 AM  Result Value Ref Range   Glucose-Capillary 114 (H) 70 - 99 mg/dL    Comment: Glucose reference range applies only to samples taken after fasting for at least 8 hours.   DG CHEST PORT 1 VIEW  Result Date: 04/03/2022 CLINICAL DATA:  Diastolic heart failure. Abdominal pain. Altered mental status EXAM: PORTABLE CHEST 1 VIEW COMPARISON:  Two-view chest x-ray 11/05/2021 and 01/10/2022. Numerous other exams as well. FINDINGS: Enlarged cardiopericardial silhouette with sternal wires. Increasing vascular congestion and interstitial changes. Moderate left effusion. No pneumothorax. Question tiny right effusion. Overlapping cardiac leads. IMPRESSION: Increasing vascular congestion and edema. Electronically Signed   By: Jill Side M.D.   On: 04/16/2022 17:40   CT Head Wo Contrast  Result Date: 04/06/2022 CLINICAL DATA:  Delirium.  Altered mental status. EXAM: CT HEAD WITHOUT CONTRAST TECHNIQUE: Contiguous axial images were obtained from the base of the skull through the vertex without intravenous contrast. RADIATION DOSE REDUCTION: This exam was performed according to the departmental dose-optimization program which includes automated exposure control, adjustment of the mA and/or kV according to patient size and/or use of iterative reconstruction technique. COMPARISON:  December 25, 2014. FINDINGS: Brain: No evidence of acute large vascular territory infarction, hemorrhage, hydrocephalus, extra-axial collection or mass lesion/mass effect. Patchy white matter hypodensities are nonspecific but compatible with chronic microvascular ischemic disease. Vascular: No hyperdense vessel.  Calcific atherosclerosis. Skull: No acute fracture. Sinuses/Orbits: Mild paranasal sinus mucosal thickening. Remote left medial orbital wall  fracture. Other: No mastoid effusions. IMPRESSION: No evidence of acute intracranial abnormality. Electronically Signed   By: Margaretha Sheffield M.D.   On: 03/28/2022 14:48   CT ABDOMEN PELVIS WO CONTRAST  Result Date: 04/13/2022 CLINICAL DATA:  Abdominal pain. Altered mental status. Recent diagnosis of pneumonia. EXAM: CT ABDOMEN AND PELVIS WITHOUT CONTRAST TECHNIQUE: Multidetector CT imaging of the abdomen and pelvis was performed following the standard protocol without IV contrast. RADIATION DOSE REDUCTION: This exam was performed according to the departmental dose-optimization program which includes automated exposure control, adjustment of the mA and/or kV according to patient size and/or use of iterative reconstruction technique. COMPARISON:  07/22/2008. FINDINGS: Lower chest: Heart mildly enlarged. Dense three-vessel coronary artery calcifications. Previous CABG surgery. No pericardial effusion. Small left and trace right pleural effusions. Calcified pleural plaque posteriorly at the right lung base. Left lung base, predominantly lower lobe, atelectasis. Bilateral interstitial thickening with intervening hazy airspace opacities suspected to be pulmonary edema. Hepatobiliary: Normal liver. Dependent gallstone. No findings of acute cholecystitis. No bile duct dilation. Pancreas: Unremarkable. No pancreatic ductal dilatation or surrounding inflammatory changes. Spleen: Normal in size without focal abnormality. Adrenals/Urinary Tract: 1.1 cm low-attenuation left adrenal mass, average Hounsfield units of 9, consistent with an adenoma, with without significant change. No follow-up recommended. Normal right adrenal gland. Bilateral renal cortical thinning. A few small low-attenuation renal masses consistent with cysts. No follow-up recommended. Nonobstructing stone in the midpole the right kidney. No hydronephrosis. Ureters normal in course and in caliber. Bladder is unremarkable. Stomach/Bowel: Stomach is within  normal limits. Appendix appears normal. No evidence of bowel wall thickening, distention, or inflammatory changes. Vascular/Lymphatic: Dense aortic atherosclerotic calcifications. No aneurysm. No  enlarged lymph nodes. Reproductive: Unremarkable. Other: Small amount of ascites.  Diffuse subcutaneous edema. Musculoskeletal: No fracture or acute finding.  No bone lesion. IMPRESSION: 1. Lung base findings consistent with congestive heart failure with pulmonary edema, small left and trace right pleural effusions. Additional left lung base opacity is consistent with atelectasis. A component of pneumonia is not excluded, however. 2. Small amount of ascites and diffuse subcutaneous edema consistent with anasarca. 3. No convincing acute abnormality within the abdomen or pelvis. 4. Cholelithiasis. Small nonobstructing stone the right kidney. Dense aortic atherosclerotic calcifications. Electronically Signed   By: Lajean Manes M.D.   On: 04/15/2022 13:21    Pending Labs Unresulted Labs (From admission, onward)     Start     Ordered   03/31/22 0500  Procalcitonin  Daily,   R      04/09/2022 1706   03/31/22 8527  Basic metabolic panel  Daily,   R      04/06/2022 2334            Vitals/Pain Today's Vitals   03/31/22 1100 03/31/22 1130 03/31/22 1200 03/31/22 1300  BP: 125/70 120/74 133/86 104/63  Pulse: (!) 112 (!) 124 (!) 125   Resp: 13 (!) '23 20 14  '$ Temp: 97.7 F (36.5 C) 97.7 F (36.5 C) 97.7 F (36.5 C) 97.9 F (36.6 C)  TempSrc:      SpO2: 97% 96% 100%   Weight:      Height:      PainSc:        Isolation Precautions No active isolations  Medications Medications  pantoprazole (PROTONIX) injection 40 mg (40 mg Intravenous Given 04/15/2022 1708)  albuterol (PROVENTIL) (2.5 MG/3ML) 0.083% nebulizer solution 2.5 mg (has no administration in time range)  fluticasone furoate-vilanterol (BREO ELLIPTA) 100-25 MCG/ACT 1 puff (1 puff Inhalation Not Given 03/31/22 0816)  umeclidinium bromide (INCRUSE  ELLIPTA) 62.5 MCG/ACT 1 puff (1 puff Inhalation Not Given 03/31/22 0816)  furosemide (LASIX) injection 40 mg (40 mg Intravenous Given 03/31/22 0828)  acetaminophen (TYLENOL) tablet 650 mg (has no administration in time range)    Or  acetaminophen (TYLENOL) suppository 650 mg (has no administration in time range)  metoprolol tartrate (LOPRESSOR) injection 5 mg (has no administration in time range)  perflutren lipid microspheres (DEFINITY) IV suspension (3 mLs Intravenous Given 03/31/22 0922)  lactated ringers bolus 1,000 mL (0 mLs Intravenous Stopped 04/20/2022 1440)  morphine (PF) 4 MG/ML injection 4 mg (4 mg Intravenous Given 04/11/2022 1226)  ondansetron (ZOFRAN) injection 4 mg (4 mg Intravenous Given 03/29/2022 1226)  furosemide (LASIX) injection 40 mg (40 mg Intravenous Given 04/12/2022 1444)    Mobility non-ambulatory High fall risk   Focused Assessments    R Recommendations: See Admitting Provider Note  Report given to:   Additional Notes:

## 2022-03-31 NOTE — ED Notes (Signed)
Patient removed bair hugger.

## 2022-03-31 NOTE — Plan of Care (Signed)

## 2022-03-31 NOTE — TOC Progression Note (Signed)
   Transition of Care [facility]) Screening Note   Patient Details  Name: Donald Gaines Date of Birth: 1940-05-21   Transition of Care [facility]) CM/SW Contact:    Boneta Lucks, RN Phone Number: 03/31/2022, 12:04 PM  Patient his from [facility]. PT eval pending.  Transition of Care Department [facility]) has reviewed patient and no TOC needs have been identified at this time. We will continue to monitor patient advancement through interdisciplinary progression rounds. If new patient transition needs arise, please place a TOC consult.     Barriers to Discharge: Continued Medical Work up  Expected Discharge Plan and Services                  Social Determinants of Health (SDOH) Interventions SDOH Screenings   Food Insecurity: No Food Insecurity (01/26/2022)  Recent Concern: [place] Present (12/22/2021)  Housing: Low Risk  (01/26/2022)  Transportation Needs: No Transportation Needs (01/26/2022)  Utilities: Not At Risk (01/26/2022)  Alcohol Screen: Low Risk  (01/26/2022)  Depression (PHQ2-9): Low Risk  (01/26/2022)  Financial Resource Strain: Low Risk  (01/26/2022)  Physical Activity: Inactive (01/26/2022)  Social Connections: Moderately Integrated (01/26/2022)  Stress: No Stress Concern Present (01/26/2022)  Tobacco Use: High Risk (03/29/2022)

## 2022-03-31 NOTE — ED Notes (Signed)
Attempted to call report x 1  

## 2022-03-31 NOTE — Progress Notes (Signed)
Progress Note   Patient: Donald Gaines OJJ:[id number] DOB: [birth year] DOA: 04/06/2022     1 DOS: the patient was seen and examined on 03/31/2022   Brief hospital course: As per H&P written by Dr. Denton Brick on 03/29/2022 CRU KRITIKOS is a 82 y.o. male with medical history significant for diabetes mellitus, atrial fibrillation, COPD, CHF, coronary artery disease, restrictive lung disease. Patient was brought to the ED with complaints of altered mental status and abdominal pain.  Per charge nurse, he was diagnosed with pneumonia last week and has been on antibiotics.  At the time of my evaluation, patient is somnolent, arouses, able to tell me his name but not answering questions.   ED Course: Hypothermic temperature 94.6.  Heart rate 76-94, respiratory rate 10-18.  Blood pressure systolic 1 93-716.  O2 sats 93 to 100% on 2 L. BNP elevated at 901. CT abdomen and pelvis lung base consistent with CHF, pulmonary edema, left lung base atelectasis, pneumonia not excluded, small amount of ascites and diffuse subcutaneous edema consistent with anasarca. Head CT negative for acute abnormality. 1 L bolus initially given, Protonix, morphine 4 mg, then 40 mg Lasix given. Hospitalist to admit for decompensated CHF and encephalopathy.  Assessment and Plan: * Acute on chronic diastolic (congestive) heart failure (HCC) -Presenting with abdominal distention, CT showing small ascites, diffuse subcutaneous edema, + trace bilateral lower extremity edema.  BNP elevated at 901.   -Currently on 3 L  -update echo. -continue IV Lasix, continue metoprolol. -Follow strict I's and O's and daily weights.    Acute metabolic encephalopathy -Currently somnolent.  Head CT negative for acute abnormality.  Hypothermic down to 94.6.   -No leukocytosis.  Rules out for sepsis.  Normal lactic acid 1.2 and no source of infection appreciated.  -Patient reported recently finishing a week course of oral antibiotic for outpatient  pneumonia.  -Continue supportive care and hold on further antibiotics. -close monitoring to renal function, as there is concern for uremia raise. -patient unclear to want HD. -continue Holding Xanax, gabapentin -continue the use of bear huggers and follow response.  Iron deficiency anemia due to chronic blood loss -Hgb has remained stable at 7.1 -No overt bleeding appreciated. -History of chronic GI blood loss-gastric and small bowel AVM's.  Push enteroscopy 11/07/2021 - several nonbleeding angioplastic in duodenum, treated with APC, clip.  Normal endoscopy 11/06/2021.   -GI consulted and planning for no endoscopic evaluation unless further decline in hemoglobin or overt bleeding. -Continue PPI.  Chronic GI bleeding-resolved as of 03/31/2022 -  Chronic kidney disease (CKD), stage IV (severe) (HCC) -Cr- 2.98. At baseline. -Creatinine slightly higher after diuresis initiated; continue close monitoring of trend; if worse will need nephrology service consultation.  Permanent atrial fibrillation (HCC) -Rate controlled.  -Continue the use of metoprolol -No anticoagulation secondary to GIB.  COPD (chronic obstructive pulmonary disease) (HCC) -Stable overall. -continue home bronchodilator regimen  Essential hypertension -Stable. -follow vital signs -continue metoprolol  Type 2 diabetes mellitus (HCC) -A1c 5.4.   -d/c metformin at discharge -follow cbg's and if above 200 will need SSI coverage.   Subjective:  Still disoriented, no chest pain, no nausea or vomiting.  Experiencing intermittent with coughing spells.  Currently afebrile.  Having difficulty speaking in full sentences and demonstrating crackles on auscultation bilaterally.  Physical Exam: Vitals:   03/31/22 1500 03/31/22 1600 03/31/22 1612 03/31/22 1700  BP: (!) 104/41 104/66  (!) 87/49  Pulse: (!) 119 (!) 101 85 99  Resp: '18 13 16 '$ 11  Temp: 97.9 F (36.6 C) 98.1 F (36.7 C) 98.2 F (36.8 C) 98.2 F (36.8 C)   TempSrc: Oral     SpO2: 98% 93% 94% 95%  Weight: 82.1 kg     Height: '5\' 5"'$  (1.651 m)      General exam: Alert, awake, oriented x 2; afebrile, demonstrating difficulty speaking in full sentences and having intermittent wet coughing pills. Respiratory system: Positive rhonchi bilaterally; no wheezing, positive crackles.  No using accessory muscle. Cardiovascular system:Rate controlled. Positive murmur. Gastrointestinal system: Abdomen is nondistended, soft and nontender. No organomegaly or masses felt. Normal bowel sounds heard. Central nervous system: No focal neurological deficits. Extremities: No cyanosis or clubbing; 1+ edema bilaterally appreciated. Skin: No petechiae. Psychiatry: Mood & affect appropriate.    Data Reviewed: Procalcitonin: 3.53 Basic metabolic panel: Sodium 614, potassium 4.2, chloride 100, bicarb 29, BUN 62, creatinine 3.28 CBC: WBCs 8.1, hemoglobin 7.1, platelet count 271 K  Family Communication: No family bedside.  Disposition: Status is: Inpatient Remains inpatient appropriate because: Continue IV diuresis for CHF exacerbation.   Planned Discharge Destination:  To be determined  Time spent: 50 minutes  Author: Barton Dubois, MD 03/31/2022 6:01 PM  For on call review [email or web address].

## 2022-04-01 ENCOUNTER — Inpatient Hospital Stay (HOSPITAL_COMMUNITY): Payer: Medicare HMO

## 2022-04-01 ENCOUNTER — Encounter (HOSPITAL_COMMUNITY): Payer: Self-pay | Admitting: Internal Medicine

## 2022-04-01 DIAGNOSIS — K921 Melena: Secondary | ICD-10-CM

## 2022-04-01 DIAGNOSIS — Z515 Encounter for palliative care: Secondary | ICD-10-CM | POA: Diagnosis not present

## 2022-04-01 DIAGNOSIS — I5033 Acute on chronic diastolic (congestive) heart failure: Secondary | ICD-10-CM | POA: Diagnosis not present

## 2022-04-01 DIAGNOSIS — D500 Iron deficiency anemia secondary to blood loss (chronic): Secondary | ICD-10-CM | POA: Diagnosis not present

## 2022-04-01 DIAGNOSIS — Z7189 Other specified counseling: Secondary | ICD-10-CM

## 2022-04-01 LAB — CBC
HCT: 23.4 % — ABNORMAL LOW (ref 39.0–52.0)
Hemoglobin: 6.5 g/dL — CL (ref 13.0–17.0)
MCH: 30.2 pg (ref 26.0–34.0)
MCHC: 27.8 g/dL — ABNORMAL LOW (ref 30.0–36.0)
MCV: 108.8 fL — ABNORMAL HIGH (ref 80.0–100.0)
Platelets: 286 10*3/uL (ref 150–400)
RBC: 2.15 MIL/uL — ABNORMAL LOW (ref 4.22–5.81)
RDW: 18.2 % — ABNORMAL HIGH (ref 11.5–15.5)
WBC: 7.5 10*3/uL (ref 4.0–10.5)
nRBC: 0 % (ref 0.0–0.2)

## 2022-04-01 LAB — BASIC METABOLIC PANEL
Anion gap: 15 (ref 5–15)
BUN: 68 mg/dL — ABNORMAL HIGH (ref 8–23)
CO2: 28 mmol/L (ref 22–32)
Calcium: 8.5 mg/dL — ABNORMAL LOW (ref 8.9–10.3)
Chloride: 98 mmol/L (ref 98–111)
Creatinine, Ser: 3.73 mg/dL — ABNORMAL HIGH (ref 0.61–1.24)
GFR, Estimated: 16 mL/min — ABNORMAL LOW (ref >60)
Glucose, Bld: 119 mg/dL — ABNORMAL HIGH (ref 70–99)
Potassium: 4.6 mmol/L (ref 3.5–5.1)
Sodium: 141 mmol/L (ref 135–145)

## 2022-04-01 LAB — GLUCOSE, CAPILLARY
Glucose-Capillary: 113 mg/dL — ABNORMAL HIGH (ref 70–99)
Glucose-Capillary: 127 mg/dL — ABNORMAL HIGH (ref 70–99)
Glucose-Capillary: 130 mg/dL — ABNORMAL HIGH (ref 70–99)

## 2022-04-01 LAB — ECHOCARDIOGRAM COMPLETE
Area-P 1/2: 5.97 cm2
Height: 65 in
S' Lateral: 3.4 cm
Weight: 2906.54 oz

## 2022-04-01 LAB — BRAIN NATRIURETIC PEPTIDE: B Natriuretic Peptide: 1027 pg/mL — ABNORMAL HIGH (ref 0.0–100.0)

## 2022-04-01 LAB — PROCALCITONIN: Procalcitonin: 0.24 ng/mL

## 2022-04-01 LAB — PREPARE RBC (CROSSMATCH)

## 2022-04-01 MED ORDER — SODIUM CHLORIDE 0.9% IV SOLUTION: Freq: Once | INTRAVENOUS | Status: AC

## 2022-04-01 MED ORDER — FUROSEMIDE 10 MG/ML IJ SOLN
20.0000 mg | Freq: Once | INTRAMUSCULAR | Status: AC
  Administered 2022-04-01: 20 mg via INTRAVENOUS
  Filled 2022-04-01: qty 2

## 2022-04-01 NOTE — Progress Notes (Signed)
Date and time results received: 04/01/22 0830 (use smartphrase ".now" to insert current time)  Test: Hgb  Critical Value: 6.5  Name of Provider Notified: Courage, MD.  Orders Received? Or Actions Taken?: Orders Received - See Orders for details

## 2022-04-01 NOTE — Consult Note (Signed)
Consultation Note Date: 04/01/2022   Patient Name: Donald Gaines  DOB: 1941-02-28  MRN: [id number]  Age / Sex: 82 y.o., male  PCP: Celene Squibb, MD Referring Physician: Roxan Hockey, MD  Reason for Consultation: Establishing goals of care  HPI/Patient Profile: 82 y.o. 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   Clinical Assessment and Goals of Care: I have reviewed medical records including EPIC notes, labs and imaging, received report from RN, assessed the patient.  [name] is resting quietly in bed.  He appears acutely/chronically ill and quite frail.  He is resting comfortably, but wakes easily when I call his name.  He is alert, oriented to self and situation only.  I do believe that he can make his needs known.  There is no family at bedside at this time.    Call to sister, Donald Gaines, at [phone].  No answer, no voicemail message left.  Call to sister, Donald Gaines, at [phone].  Call to [facility] to discuss diagnosis prognosis, [place], EOL wishes, disposition and options.  I introduced Palliative Medicine as specialized medical care for people living with serious illness. It focuses on providing relief from the symptoms and stress of a serious illness. The goal is to improve quality of life for both the patient and the family.  We discussed a brief life review of the patient.  Donald Gaines (82 years old) shares that Donald Gaines has been  at [facility] for approximately 6 weeks.   Donald Gaines shares that [name], "Peanut", had been living with their older sister, Donald Gaines 80 years old), but she "kicked him out".  Donald Gaines shares that [name] is "a mean bitch" who said that Donald Gaines was being aggressive.  Donald Gaines shares that she believes that [name] is having memory loss.  She tells me that Mr. Gapinski was married 3 times but is a widower.  He had 1 child, a daughter, who died in the summer 2022.  Donald Gaines does shared that she believes that Donald Gaines is also having memory loss.  We then focused on their current illness.  We talk about low blood and administering PRBC.  We talked about fluid overload and heart failure.  We talk about the treatment plan and time for outcomes.  At this point family would want him to return to ALF.  The natural disease trajectory and expectations at EOL were discussed.  Advanced directives, concepts specific to code status, were considered and discussed.  Sister Donald Gaines shares that her brother has not discussed advanced directive/CODE STATUS with her.  I shared that we will continue these discussions with Donald Gaines if/when  able.  At this point remain full scope/full code by default.  Discussed the importance of continued conversation with family and the medical providers regarding overall plan of care and treatment options, ensuring decisions are within the context of the patient's values and GOCs.  Questions and concerns were addressed.  The family was encouraged to call with questions or concerns.  PMT will continue to support holistically.  Conference with attending, bedside nursing staff, transition of care team related to patient condition, needs, goals of care, disposition.   HCPOA NEXT OF KIN -had been living with sister, Donald Gaines until approximately 6 to 8 weeks ago when he went to ALF at [facility].  Also has sister, Donald Gaines.  Donald Gaines was married 3 times but is a widower.  His only child, daughter,  died in the summer 2022.    SUMMARY OF RECOMMENDATIONS   At this point continue full scope/full code by default Time for outcomes Hopeful to return to ALF/[place]: Full code  Symptom Management:  Per hospitalist, no additional needs at this time.  Palliative Prophylaxis:  Frequent Pain Assessment, Oral Care, and Turn Reposition  Additional Recommendations (Limitations, Scope, Preferences): Full Scope Treatment  Psycho-social/Spiritual:  Desire for further Chaplaincy support:no Additional Recommendations: Caregiving  Support/Resources  Prognosis:  Unable to determine, based on outcomes.  Guarded at this point.  Discharge Planning: To be determined, based on outcomes and qualifications      Primary Diagnoses: Present on Admission:  Acute on chronic diastolic (congestive) heart failure (HCC)  Acute metabolic encephalopathy  Essential hypertension  COPD (chronic obstructive pulmonary disease) (HCC)  (Resolved) Chronic GI bleeding  Iron deficiency anemia due to chronic blood loss  Permanent atrial fibrillation (HCC)  Chronic kidney disease (CKD), stage IV (severe) ([place])   I have reviewed the medical record, interviewed the patient and family, and examined the patient. The following aspects are pertinent.  Past Medical History:  Diagnosis Date   Arteriovenous malformation small bowel    Capsule study 3/10   Bilateral carotid artery stenosis    BPH (benign prostatic hypertrophy)    Candida esophagitis (HCC)    Cataract    Chronic anemia    Chronic diarrhea    COPD (chronic obstructive pulmonary disease) (HCC)    Coronary atherosclerosis of native coronary artery    Multivessel, BMS SVG TO RCA 2000, reportedly  "2" additional stents in interim   Dementia ([place])    Depression    Diabetes mellitus, type 2 ([place])    Essential hypertension    GERD (gastroesophageal reflux disease)    GI bleeding    Cecal ulcers, arteriovenous  malformations   Glaucoma    Hemorrhoids    Hyperlipidemia    Myocardial infarction ([place])    1994   Portal hypertensive gastropathy ([place]) 11/08/2012   EGD. Dr. Laural Golden   Restrictive lung disease    Sleep apnea    TIA (transient ischemic attack)    2005   Social History   Socioeconomic History   Marital status: Divorced    Spouse name: Not on file   Number of children: Not on file   Years of education: 12   Highest education level: 12th grade  Occupational History   Not on file  Tobacco Use   Smoking status: Every Day    Packs/day: 1.00    Years: 60.00    Total pack years: 60.00    Types: Cigarettes    Start date: 01/02/1949  Passive exposure: Current   Smokeless tobacco: Never   Tobacco comments:    1 pack a day    Smoking Cessation Offered  Vaping Use   Vaping Use: Never used  Substance and Sexual Activity   Alcohol use: No    Alcohol/week: 0.0 standard drinks of alcohol    Comment: No etoh in 12 yrs.   Drug use: Yes    Types: Marijuana    Comment: last use 10/25/19   Sexual activity: Yes    Partners: Female    Birth control/protection: None  Other Topics Concern   Not on file  Social History Narrative   Not on file   Social Determinants of Health   Financial Resource Strain: Low Risk  (01/26/2022)   Overall Financial Resource Strain (CARDIA)    Difficulty of Paying Living Expenses: Not hard at all  Food Insecurity: No Food Insecurity (01/26/2022)   Hunger Vital Sign    Worried About Running Out of Food in the Last Year: Never true    Ran Out of Food in the Last Year: Never true  Recent Concern: [place] Present (12/22/2021)   Hunger Vital Sign    Worried About Running Out of Food in the Last Year: Sometimes true    Ran Out of Food in the Last Year: Sometimes true  Transportation Needs: No Transportation Needs (01/26/2022)   PRAPARE - Hydrologist (Medical): No    Lack of Transportation (Non-Medical):  No  Physical Activity: Inactive (01/26/2022)   Exercise Vital Sign    Days of Exercise per Week: 0 days    Minutes of Exercise per Session: 0 min  Stress: No Stress Concern Present (01/26/2022)   [place]    Feeling of Stress : Only a little  Social Connections: Moderately Integrated (01/26/2022)   Social Connection and Isolation Panel [NHANES]    Frequency of Communication with Friends and Family: More than three times a week    Frequency of Social Gatherings with Friends and Family: More than three times a week    Attends Religious Services: More than 4 times per year    Active Member of [employer] or Organizations: Yes    Attends Music therapist: More than 4 times per year    Marital Status: Divorced   Family History  Problem Relation Age of Onset   Heart attack Mother    Heart disease Mother    Hypertension Mother    Scheduled Meds:  Chlorhexidine Gluconate Cloth  6 each Topical Q0600   fluticasone furoate-vilanterol  1 puff Inhalation Daily   furosemide  20 mg Intravenous Once   furosemide  40 mg Intravenous Daily   metoprolol tartrate  2.5 mg Intravenous Q8H   octreotide  100 mcg Subcutaneous BID   pantoprazole (PROTONIX) IV  40 mg Intravenous Q24H   umeclidinium bromide  1 puff Inhalation Daily   Continuous Infusions: PRN Meds:.acetaminophen **OR** acetaminophen, albuterol, HYDROmorphone (DILAUDID) injection Medications Prior to Admission:  Prior to Admission medications   Medication Sig Start Date End Date Taking? Authorizing Provider  albuterol (PROVENTIL) (2.5 MG/3ML) 0.083% nebulizer solution Take 3 mLs (2.5 mg total) by nebulization every 2 (two) hours as needed for wheezing or shortness of breath. Patient taking differently: Take 2.5 mg by nebulization every 6 (six) hours as needed for wheezing or shortness of breath (wheezing/SOB). 06/05/20  Yes Johnson, Clanford L, MD  ALPRAZolam Duanne Moron)  0.5  MG tablet Take 0.5 mg by mouth 2 (two) times daily as needed for anxiety.   Yes [provider]  ASPIRIN ADULT LOW STRENGTH 81 MG tablet Take 81 mg by mouth daily. 02/20/22  Yes [provider]  BREO ELLIPTA 100-25 MCG/ACT AEPB Inhale 1 puff into the lungs daily.   Yes [provider]  cetirizine (ZYRTEC) 10 MG tablet Take 10 mg by mouth daily.   Yes [provider]  docusate sodium (COLACE) 100 MG capsule Take 100 mg by mouth daily. 03/26/22  Yes [provider]  doxycycline (VIBRAMYCIN) 100 MG capsule Take 100 mg by mouth 2 (two) times daily. 7 day course started on 03/26/22 and will end 04/01/22 03/26/22  Yes [provider]  escitalopram (LEXAPRO) 10 MG tablet Take 10 mg by mouth daily. 05/16/20  Yes [provider]  ferrous sulfate 325 (65 FE) MG tablet Take 325 mg by mouth daily with breakfast.   Yes [provider]  furosemide (LASIX) 40 MG tablet Take 1 tablet (40 mg total) by mouth 2 (two) times daily. 06/05/20  Yes Johnson, Clanford L, MD  gabapentin (NEURONTIN) 100 MG capsule Take 100 mg by mouth at bedtime. 10/30/21  Yes [provider]  INCRUSE ELLIPTA 62.5 MCG/ACT AEPB Inhale 1 puff into the lungs daily.   Yes [provider]  melatonin 3 MG TABS tablet Take 3 mg by mouth at bedtime. 03/20/22  Yes [provider]  metFORMIN (GLUCOPHAGE) 500 MG tablet Take 500 mg by mouth daily with breakfast.   Yes [provider]  metoprolol succinate (TOPROL-XL) 25 MG 24 hr tablet Take 25 mg by mouth daily.   Yes [provider]  omeprazole (PRILOSEC) 20 MG capsule Take 20 mg by mouth daily.  08/28/13  Yes [provider]  rosuvastatin (CRESTOR) 20 MG tablet Take 1 tablet (20 mg total) by mouth daily. Patient taking differently: Take 10 mg by mouth daily. 12/12/13  Yes Satira Sark, MD  Vitamin D, Ergocalciferol, 50000 units CAPS Take 1 capsule by mouth once a week. Sunday  03/12/22  Yes [provider]   Allergies  Allergen Reactions   Penicillins Rash    Has patient had a PCN reaction causing immediate rash, facial/tongue/throat swelling, SOB or lightheadedness with hypotension: Yes Has patient had a PCN reaction causing severe rash involving mucus membranes or skin necrosis: No Has patient had a PCN reaction that required hospitalization No Has patient had a PCN reaction occurring within the last 10 years: No If all of the above answers are "NO", then may proceed with Cephalosporin use.    Review of Systems  Unable to perform ROS: Acuity of condition    Physical Exam Vitals and nursing note reviewed.  Constitutional:      General: He is not in acute distress.    Appearance: He is ill-appearing.  Cardiovascular:     Rate and Rhythm: Normal rate.  Pulmonary:     Effort: Pulmonary effort is normal. No respiratory distress.  Musculoskeletal:        General: Swelling present.     Comments: Anasarca at thighs  Skin:    General: Skin is warm and dry.  Neurological:     Mental Status: He is alert.     Comments: Oriented to person and place, not month     Vital Signs: BP 106/60   Pulse 81   Temp (!) 97.5 F (36.4 C) (Axillary)   Resp 10   Ht 5'  5" (1.651 m)   Wt 82.2 kg   SpO2 100%   BMI 30.16 kg/m  Pain Scale: 0-10 POSS *See Group Information*: S-Acceptable,Sleep, easy to arouse Pain Score: 0-No pain   SpO2: SpO2: 100 % O2 Device:SpO2: 100 % O2 Flow Rate: .O2 Flow Rate (L/min): 4 L/min  IO: Intake/output summary:  Intake/Output Summary (Last 24 hours) at 04/01/2022 1324 Last data filed at 04/01/2022 1025 Gross per 24 hour  Intake 360 ml  Output 665 ml  Net -305 ml    LBM:   Baseline Weight: Weight: 77.1 kg Most recent weight: Weight: 82.2 kg     Palliative Assessment/Data:     Time In: 0900 Time Out: 1015 Time Total: 75 minutes  Greater than 50%  of this time was spent counseling and coordinating care related  to the above assessment and plan.  Signed by: Drue Novel, NP   Please contact Palliative Medicine Team phone at [phone] for questions and concerns.  For individual provider: See Shea Evans

## 2022-04-01 NOTE — H&P (View-Only) (Signed)
Subjective: Patient on 6L humidified supplemental O2. Some retractions noted during expiration. He reports he is sleepy, denies dizziness, some SOB with rest.   Objective: Vital signs in last 24 hours: Temp:  [97.7 F (36.5 C)-98.8 F (37.1 C)] 97.7 F (36.5 C) (01/10 1000) Pulse Rate:  [82-138] 138 (01/10 1000) Resp:  [11-28] 17 (01/10 1000) BP: (87-133)/(35-86) 124/66 (01/10 1000) SpO2:  [88 %-100 %] 99 % (01/10 1000) Weight:  [82.1 kg-82.2 kg] 82.2 kg (01/10 0500)   General:   Alert and oriented, pleasant Head:  Normocephalic and atraumatic. Eyes:  No icterus, sclera clear. Conjuctiva pale Mouth:  Without lesions, mucosa pink and moist.  Neck:  Supple, without thyromegaly or masses.  Heart:  S1, S2 present, no murmurs noted.  Lungs: rhonchi, mild crackles  Abdomen:  Bowel sounds present, soft, non-tender, non-distended. No HSM or hernias noted. No rebound or guarding. No masses appreciated  Msk:  Symmetrical without gross deformities. Normal posture. Pulses:  Normal pulses noted. Extremities:  Without clubbing or edema. Neurologic:  Alert and  oriented x4;  grossly normal neurologically. Skin:  Warm and dry, intact without significant lesions.  Psych:  Alert and cooperative. Normal mood and affect.  Intake/Output from previous day: 01/09 0701 - 01/10 0700 In: 240 [P.O.:240] Out: 600 [Urine:600] Intake/Output this shift: Total I/O In: 120 [P.O.:120] Out: -   Lab Results: Recent Labs    04/01/2022 1203 03/31/22 0453 04/01/22 0830  WBC 6.1 8.1 7.5  HGB 7.1* 7.1* 6.5*  HCT 25.0* 25.3* 23.4*  PLT 268 271 286   BMET Recent Labs    03/28/2022 1203 03/31/22 0453 04/01/22 0411  NA 139 142 141  K 3.7 4.2 4.6  CL 97* 100 98  CO2 '30 29 28  '$ GLUCOSE 104* 102* 119*  BUN 63* 62* 68*  CREATININE 2.98* 3.28* 3.73*  CALCIUM 9.2 8.9 8.5*   LFT Recent Labs    03/25/2022 1203  PROT 6.4*  ALBUMIN 3.0*  AST 18  ALT 10  ALKPHOS 74  BILITOT 0.7    Studies/Results: ECHOCARDIOGRAM COMPLETE  Result Date: 03/31/2022    ECHOCARDIOGRAM REPORT   Patient Name:   Donald Gaines Date of Exam: 03/31/2022 Medical Rec #:  [id number]        Height:       65.0 in Accession #:    [id number]       Weight:       170.0 lb Date of Birth:  05/21/1940       BSA:          1.846 m Patient Age:    82 years         BP:           115/70 mmHg Patient Gender: M                HR:           105 bpm. Exam Location:  [facility] Procedure: 2D Echo, Cardiac Doppler and Color Doppler Indications:    Congestive Heart Failure I50.9  History:        Patient has prior history of Echocardiogram examinations, most                 recent 06/03/2020. CHF, COPD and TIA, Arrythmias:Atrial                 Fibrillation; Risk Factors:Diabetes, Hypertension and                 Dyslipidemia.  GERD, Hx of smoking, Chronic kidney disease (CKD),                 stage IV (severe) ([place]).  Sonographer:    Alvino Chapel RCS Referring Phys: [phone] Leanne Chang [facility]  Sonographer Comments: Technically difficult study due to poor echo windows. IMPRESSIONS  1. Poor acoustic windows Very challenging study. Endocardium not seen until Definity used. LV shows significant hypertrophy with basal lateral hypokinesis and basal inferior hypokinesis. Consider other imaging modality (MRI) to evaluate for possible amyloid, when clinically improved. . Left ventricular ejection fraction, by estimation, is 60 to 65%. The left ventricle has normal function. There is severe left ventricular hypertrophy. Left ventricular diastolic parameters are indeterminate.  2. Right ventricular systolic function is normal. The right ventricular size is normal.  3. Left atrial size was severely dilated.  4. Right atrial size was moderately dilated.  5. Large pleural effusion.  6. The mitral valve is normal in structure. Mild mitral valve regurgitation.  7. Tricuspid valve regurgitation is mild to moderate.  8. The aortic valve is tricuspid.  Aortic valve regurgitation is not visualized. Aortic valve sclerosis is present, with no evidence of aortic valve stenosis.  9. The inferior vena cava is dilated in size with <50% respiratory variability, suggesting right atrial pressure of 15 mmHg. FINDINGS  Left Ventricle: Poor acoustic windows Very challenging study. Endocardium not seen until Definity used. LV shows significant hypertrophy with basal lateral hypokinesis and basal inferior hypokinesis. Consider other imaging modality (MRI) to evaluate for  possible amyloid, when clinically improved. Left ventricular ejection fraction, by estimation, is 60 to 65%. The left ventricle has normal function. Definity contrast agent was given IV to delineate the left ventricular endocardial borders. The left ventricular internal cavity size was normal in size. There is severe left ventricular hypertrophy. Left ventricular diastolic parameters are indeterminate. Right Ventricle: The right ventricular size is normal. Right vetricular wall thickness was not assessed. Right ventricular systolic function is normal. Left Atrium: Left atrial size was severely dilated. Right Atrium: Right atrial size was moderately dilated. Pericardium: There is no evidence of pericardial effusion. Mitral Valve: The mitral valve is normal in structure. Mild mitral valve regurgitation. Tricuspid Valve: The tricuspid valve is normal in structure. Tricuspid valve regurgitation is mild to moderate. Aortic Valve: The aortic valve is tricuspid. Aortic valve regurgitation is not visualized. Aortic valve sclerosis is present, with no evidence of aortic valve stenosis. Pulmonic Valve: The pulmonic valve was grossly normal. Pulmonic valve regurgitation is not visualized. Aorta: The aortic root is normal in size and structure. Venous: The inferior vena cava is dilated in size with less than 50% respiratory variability, suggesting right atrial pressure of 15 mmHg. IAS/Shunts: No atrial level shunt detected  by color flow Doppler. Additional Comments: There is a large pleural effusion.  LEFT VENTRICLE PLAX 2D LVIDd:         5.00 cm LVIDs:         3.40 cm LV PW:         1.70 cm LV IVS:        1.40 cm LVOT diam:     2.00 cm LV SV:         45 LV SV Index:   24 LVOT Area:     3.14 cm  RIGHT VENTRICLE TAPSE (M-mode): 1.2 cm LEFT ATRIUM              Index        RIGHT ATRIUM  Index LA diam:        4.90 cm  2.65 cm/m   RA Area:     23.50 cm LA Vol (A2C):   149.0 ml 80.71 ml/m  RA Volume:   70.90 ml  38.41 ml/m LA Vol (A4C):   105.0 ml 56.88 ml/m LA Biplane Vol: 126.0 ml 68.25 ml/m  AORTIC VALVE LVOT Vmax:   92.90 cm/s LVOT Vmean:  61.300 cm/s LVOT VTI:    0.143 m  AORTA Ao Root diam: 3.60 cm MITRAL VALVE                TRICUSPID VALVE MV Area (PHT): 5.97 cm     TR Peak grad:   45.2 mmHg MV Decel Time: 127 msec     TR Vmax:        336.00 cm/s MV E velocity: 117.00 cm/s                             SHUNTS                             Systemic VTI:  0.14 m                             Systemic Diam: 2.00 cm Dorris Carnes MD Electronically signed by Dorris Carnes MD Signature Date/Time: 03/31/2022/4:59:18 PM    Final    DG CHEST PORT 1 VIEW  Result Date: 04/06/2022 CLINICAL DATA:  Diastolic heart failure. Abdominal pain. Altered mental status EXAM: PORTABLE CHEST 1 VIEW COMPARISON:  Two-view chest x-ray 11/05/2021 and 01/10/2022. Numerous other exams as well. FINDINGS: Enlarged cardiopericardial silhouette with sternal wires. Increasing vascular congestion and interstitial changes. Moderate left effusion. No pneumothorax. Question tiny right effusion. Overlapping cardiac leads. IMPRESSION: Increasing vascular congestion and edema. Electronically Signed   By: Jill Side M.D.   On: 04/17/2022 17:40   CT Head Wo Contrast  Result Date: 04/12/2022 CLINICAL DATA:  Delirium.  Altered mental status. EXAM: CT HEAD WITHOUT CONTRAST TECHNIQUE: Contiguous axial images were obtained from the base of the skull through the vertex  without intravenous contrast. RADIATION DOSE REDUCTION: This exam was performed according to the departmental dose-optimization program which includes automated exposure control, adjustment of the mA and/or kV according to patient size and/or use of iterative reconstruction technique. COMPARISON:  December 25, 2014. FINDINGS: Brain: No evidence of acute large vascular territory infarction, hemorrhage, hydrocephalus, extra-axial collection or mass lesion/mass effect. Patchy white matter hypodensities are nonspecific but compatible with chronic microvascular ischemic disease. Vascular: No hyperdense vessel.  Calcific atherosclerosis. Skull: No acute fracture. Sinuses/Orbits: Mild paranasal sinus mucosal thickening. Remote left medial orbital wall fracture. Other: No mastoid effusions. IMPRESSION: No evidence of acute intracranial abnormality. Electronically Signed   By: Margaretha Sheffield M.D.   On: 04/18/2022 14:48   CT ABDOMEN PELVIS WO CONTRAST  Result Date: 03/29/2022 CLINICAL DATA:  Abdominal pain. Altered mental status. Recent diagnosis of pneumonia. EXAM: CT ABDOMEN AND PELVIS WITHOUT CONTRAST TECHNIQUE: Multidetector CT imaging of the abdomen and pelvis was performed following the standard protocol without IV contrast. RADIATION DOSE REDUCTION: This exam was performed according to the departmental dose-optimization program which includes automated exposure control, adjustment of the mA and/or kV according to patient size and/or use of iterative reconstruction technique. COMPARISON:  07/22/2008. FINDINGS: Lower chest: Heart mildly enlarged. Dense three-vessel coronary artery  calcifications. Previous CABG surgery. No pericardial effusion. Small left and trace right pleural effusions. Calcified pleural plaque posteriorly at the right lung base. Left lung base, predominantly lower lobe, atelectasis. Bilateral interstitial thickening with intervening hazy airspace opacities suspected to be pulmonary edema.  Hepatobiliary: Normal liver. Dependent gallstone. No findings of acute cholecystitis. No bile duct dilation. Pancreas: Unremarkable. No pancreatic ductal dilatation or surrounding inflammatory changes. Spleen: Normal in size without focal abnormality. Adrenals/Urinary Tract: 1.1 cm low-attenuation left adrenal mass, average Hounsfield units of 9, consistent with an adenoma, with without significant change. No follow-up recommended. Normal right adrenal gland. Bilateral renal cortical thinning. A few small low-attenuation renal masses consistent with cysts. No follow-up recommended. Nonobstructing stone in the midpole the right kidney. No hydronephrosis. Ureters normal in course and in caliber. Bladder is unremarkable. Stomach/Bowel: Stomach is within normal limits. Appendix appears normal. No evidence of bowel wall thickening, distention, or inflammatory changes. Vascular/Lymphatic: Dense aortic atherosclerotic calcifications. No aneurysm. No enlarged lymph nodes. Reproductive: Unremarkable. Other: Small amount of ascites.  Diffuse subcutaneous edema. Musculoskeletal: No fracture or acute finding.  No bone lesion. IMPRESSION: 1. Lung base findings consistent with congestive heart failure with pulmonary edema, small left and trace right pleural effusions. Additional left lung base opacity is consistent with atelectasis. A component of pneumonia is not excluded, however. 2. Small amount of ascites and diffuse subcutaneous edema consistent with anasarca. 3. No convincing acute abnormality within the abdomen or pelvis. 4. Cholelithiasis. Small nonobstructing stone the right kidney. Dense aortic atherosclerotic calcifications. Electronically Signed   By: Lajean Manes M.D.   On: 04/18/2022 13:21    Assessment: Donald Gaines. Donald Gaines is a 82 year old male with a history of  IDA due to chronic GI blood loss in setting of gastric and small bowel AVMS, COPD, DM, HTN, TIA, Afib not anticoagulated due to recurrent GI bleeding,  CAD, who presented 1/8 with hypothermia and hypoxia on room air, as well as AMS. GI consulted for Hgb 7.1, down from 9 range in Aug 2023 when last hospitalized. Admitted with acute on chronic heart failure, acute metabolic encephalopathy, blood cultures negative thus far.  Anemia: hgb 7.1 on admission, down to 6.5 this morning.  Ferritin low at 20, iron 133, TIBC 439, sat 30%. anemia likely multifactorial in setting of acute illness, known AVMs.  No hematochezia or melena per nursing staff. Started on octreotide 129mg BID yesterday. Previously Recommended outpatient EGD w/push enteroscopy for duodenal polyp removal/possible colonoscopy as well since last was in 2014, however, with ongoing decline in hgb, he may benefit from EGD w/push enteroscopy while inpatient as anemia is likely secondary to small bowel bleeding source given known multiple AVMs on last evaluation. He remains on 6L humidified supplemental O2, BNP is up to 1000 today, some retractions noted on expiration. Will make NPO at midnight, reassess in the am to see if respiratory status is more stable for potential EGD with push enteroscopy tomorrow. Nursing staff and hospitalist made aware of plan, attempting to wean down on oxygen requirements today.   Plan: PPI daily Monitor for overt GI bleeding Continue octreotide 1071m BID Trend h&h, transfuse for hgb <7 Reassess in the am for possible EGD with push enteroscopy depending on respiratory status  NPO midnight   LOS: 2 days    04/01/2022, 10:15 AM  Marvell Stavola L. CaAlver SorrowMSN, APRN, AGNP-C Adult-Gerontology Nurse Practitioner [facility]

## 2022-04-01 NOTE — Progress Notes (Signed)
Progress Note   Patient: Donald Gaines IRJ:[id number] DOB: 1941-01-12 DOA: 04/03/2022     2 DOS: the patient was seen and examined on 04/01/2022   Brief hospital course: As per H&P written by Dr. Denton Gaines on 04/14/2022 Donald Gaines is a 82 y.o. male with medical history significant for diabetes mellitus, atrial fibrillation, COPD, CHF, coronary artery disease, restrictive lung disease. Patient was brought to the ED with complaints of altered mental status and abdominal pain.  Per charge nurse, he was diagnosed with pneumonia last week and has been on antibiotics.  At the time of my evaluation, patient is somnolent, arouses, able to tell me his name but not answering questions.   ED Course: Hypothermic temperature 94.6.  Heart rate 76-94, respiratory rate 10-18.  Blood pressure systolic 1 30-160.  O2 sats 93 to 100% on 2 L. BNP elevated at 901. CT abdomen and pelvis lung base consistent with CHF, pulmonary edema, left lung base atelectasis, pneumonia not excluded, small amount of ascites and diffuse subcutaneous edema consistent with anasarca. Head CT negative for acute abnormality. 1 L bolus initially given, Protonix, morphine 4 mg, then 40 mg Lasix given. Hospitalist to admit for decompensated CHF and encephalopathy.  Assessment and Plan: 1)HFpEF/Acute on chronic diastolic (congestive) heart failure (HCC) -Presenting with abdominal distention, CT showing small ascites, diffuse subcutaneous edema, + trace bilateral lower extremity edema.  BNP elevated at 901.   -Currently on 3 L  -Echo shows-EF of 60 to 65%,  shows significant hypertrophy with basal lateral  hypokinesis and basal inferior hypokinesis.  Severe LVH, consider other imaging modality (MRI) to evaluate for possible amyloid, when clinically improved.  Severe left atrial enlargement and moderate left atrial enlargement, no aortic stenosis -continue IV Lasix, continue metoprolol. -Follow strict I's and O's and daily weights.     2)Acute metabolic encephalopathy - Head CT negative for acute abnormality.  -Hypothermia has resolved  -No evidence of acute infection -??  If uremia related with worsening renal function -Patient reported recently finishing a week course of oral antibiotic for outpatient pneumonia.  -Continue supportive care and hold on further antibiotics. -continue Holding Xanax, gabapentin  3)Acute on chronic iron deficiency anemia due to chronic blood loss -Hgb trended down, currently 6.5 -No overt bleeding appreciated. -History of chronic GI blood loss-gastric and small bowel AVM's.  Push enteroscopy 11/07/2021 - several nonbleeding angioplastic in duodenum, treated with APC, clip.  Normal endoscopy 11/06/2021.   -GI consult noted -Fields 1 unit of PRBC on 04/01/2022 -Continue PPI. -Defer to GI service timing of possible repeat endoscopy  4)Chronic kidney disease (CKD), stage IV (severe) (HCC) -Creatinine trending up currently 3.73, baseline usually just under 3 -Monitor  renal function closely with diuresis - renally adjust medications, avoid nephrotoxic agents / dehydration  / hypotension   5)Permanent atrial fibrillation (HCC) -Continue metoprolol for rate control -Severe left atrial and moderate right atrial enlargement noted -EF is preserved as above -No anticoagulation secondary to GIB.  6)COPD (chronic obstructive pulmonary disease) (HCC) -Stable overall. -continue home bronchodilator regimen  7)Essential hypertension -Metoprolol and Lasix  8)Type 2 diabetes mellitus (HCC) -A1c 5.4.   -d/c metformin at discharge Use Novolog/Humalog Sliding scale insulin with Accu-Cheks/Fingersticks as ordered   9) generalized weakness and deconditioning--- get physical therapy eval  Subjective:  -More awake, more alert -Foley is leaking--- to be removed on 04/01/2022  Physical Exam: Vitals:   04/01/22 0815 04/01/22 0900 04/01/22 0919 04/01/22 1000  BP:   121/76 124/66  Pulse: 92  82 (!)  102 (!) 138  Resp: 19 (!) '25 13 17  '$ Temp: 97.7 F (36.5 C) 97.7 F (36.5 C) 97.9 F (36.6 C) 97.7 F (36.5 C)  TempSrc:      SpO2: 97% 100% 99% 99%  Weight:      Height:       General exam: Alert, awake, oriented x 3 .no acute distress  HEENT -[place] 2L/min Lungs-lungs, no wheezing, faint bibasilar rales Heart-S1-S2, irregularly irregular Gastrointestinal system: Abdomen is nondistended, soft and nontender. +BS Central nervous system: Generalized weakness, no focal neurological deficits. Extremities:-Good pedal pulses, pitting edema noted Psychiatry: Mood & affect appropriate, more oriented and more coherent GU-Foley to be removed on 04/01/2022   Family Communication: No family bedside.  Disposition: Status is: Inpatient Remains inpatient appropriate because: Continue IV diuresis for CHF exacerbation.   Planned Discharge Destination:  To be determined   Author: Roxan Hockey, MD 04/01/2022 10:18 AM  For on call review [email or web address].

## 2022-04-01 NOTE — Progress Notes (Signed)
Report called and given to [facility], RN on Dept. 300. Pt to be transported via bed to room 316.

## 2022-04-01 NOTE — Progress Notes (Signed)
Subjective: Patient on 6L humidified supplemental O2. Some retractions noted during expiration. He reports he is sleepy, denies dizziness, some SOB with rest.   Objective: Vital signs in last 24 hours: Temp:  [97.7 F (36.5 C)-98.8 F (37.1 C)] 97.7 F (36.5 C) (01/10 1000) Pulse Rate:  [82-138] 138 (01/10 1000) Resp:  [11-28] 17 (01/10 1000) BP: (87-133)/(35-86) 124/66 (01/10 1000) SpO2:  [88 %-100 %] 99 % (01/10 1000) Weight:  [82.1 kg-82.2 kg] 82.2 kg (01/10 0500)   General:   Alert and oriented, pleasant Head:  Normocephalic and atraumatic. Eyes:  No icterus, sclera clear. Conjuctiva pale Mouth:  Without lesions, mucosa pink and moist.  Neck:  Supple, without thyromegaly or masses.  Heart:  S1, S2 present, no murmurs noted.  Lungs: rhonchi, mild crackles  Abdomen:  Bowel sounds present, soft, non-tender, non-distended. No HSM or hernias noted. No rebound or guarding. No masses appreciated  Msk:  Symmetrical without gross deformities. Normal posture. Pulses:  Normal pulses noted. Extremities:  Without clubbing or edema. Neurologic:  Alert and  oriented x4;  grossly normal neurologically. Skin:  Warm and dry, intact without significant lesions.  Psych:  Alert and cooperative. Normal mood and affect.  Intake/Output from previous day: 01/09 0701 - 01/10 0700 In: 240 [P.O.:240] Out: 600 [Urine:600] Intake/Output this shift: Total I/O In: 120 [P.O.:120] Out: -   Lab Results: Recent Labs    04/22/2022 1203 03/31/22 0453 04/01/22 0830  WBC 6.1 8.1 7.5  HGB 7.1* 7.1* 6.5*  HCT 25.0* 25.3* 23.4*  PLT 268 271 286   BMET Recent Labs    03/31/2022 1203 03/31/22 0453 04/01/22 0411  NA 139 142 141  K 3.7 4.2 4.6  CL 97* 100 98  CO2 '30 29 28  '$ GLUCOSE 104* 102* 119*  BUN 63* 62* 68*  CREATININE 2.98* 3.28* 3.73*  CALCIUM 9.2 8.9 8.5*   LFT Recent Labs    04/09/2022 1203  PROT 6.4*  ALBUMIN 3.0*  AST 18  ALT 10  ALKPHOS 74  BILITOT 0.7    Studies/Results: ECHOCARDIOGRAM COMPLETE  Result Date: 03/31/2022    ECHOCARDIOGRAM REPORT   Patient Name:   Donald Gaines Date of Exam: 03/31/2022 Medical Rec #:  [id number]        Height:       65.0 in Accession #:    [id number]       Weight:       170.0 lb Date of Birth:  1941/03/03       BSA:          1.846 m Patient Age:    83 years         BP:           115/70 mmHg Patient Gender: M                HR:           105 bpm. Exam Location:  [facility] Procedure: 2D Echo, Cardiac Doppler and Color Doppler Indications:    Congestive Heart Failure I50.9  History:        Patient has prior history of Echocardiogram examinations, most                 recent 06/03/2020. CHF, COPD and TIA, Arrythmias:Atrial                 Fibrillation; Risk Factors:Diabetes, Hypertension and                 Dyslipidemia.  GERD, Hx of smoking, Chronic kidney disease (CKD),                 stage IV (severe) ([place]).  Sonographer:    Alvino Chapel RCS Referring Phys: [phone] Leanne Chang [facility]  Sonographer Comments: Technically difficult study due to poor echo windows. IMPRESSIONS  1. Poor acoustic windows Very challenging study. Endocardium not seen until Definity used. LV shows significant hypertrophy with basal lateral hypokinesis and basal inferior hypokinesis. Consider other imaging modality (MRI) to evaluate for possible amyloid, when clinically improved. . Left ventricular ejection fraction, by estimation, is 60 to 65%. The left ventricle has normal function. There is severe left ventricular hypertrophy. Left ventricular diastolic parameters are indeterminate.  2. Right ventricular systolic function is normal. The right ventricular size is normal.  3. Left atrial size was severely dilated.  4. Right atrial size was moderately dilated.  5. Large pleural effusion.  6. The mitral valve is normal in structure. Mild mitral valve regurgitation.  7. Tricuspid valve regurgitation is mild to moderate.  8. The aortic valve is tricuspid.  Aortic valve regurgitation is not visualized. Aortic valve sclerosis is present, with no evidence of aortic valve stenosis.  9. The inferior vena cava is dilated in size with <50% respiratory variability, suggesting right atrial pressure of 15 mmHg. FINDINGS  Left Ventricle: Poor acoustic windows Very challenging study. Endocardium not seen until Definity used. LV shows significant hypertrophy with basal lateral hypokinesis and basal inferior hypokinesis. Consider other imaging modality (MRI) to evaluate for  possible amyloid, when clinically improved. Left ventricular ejection fraction, by estimation, is 60 to 65%. The left ventricle has normal function. Definity contrast agent was given IV to delineate the left ventricular endocardial borders. The left ventricular internal cavity size was normal in size. There is severe left ventricular hypertrophy. Left ventricular diastolic parameters are indeterminate. Right Ventricle: The right ventricular size is normal. Right vetricular wall thickness was not assessed. Right ventricular systolic function is normal. Left Atrium: Left atrial size was severely dilated. Right Atrium: Right atrial size was moderately dilated. Pericardium: There is no evidence of pericardial effusion. Mitral Valve: The mitral valve is normal in structure. Mild mitral valve regurgitation. Tricuspid Valve: The tricuspid valve is normal in structure. Tricuspid valve regurgitation is mild to moderate. Aortic Valve: The aortic valve is tricuspid. Aortic valve regurgitation is not visualized. Aortic valve sclerosis is present, with no evidence of aortic valve stenosis. Pulmonic Valve: The pulmonic valve was grossly normal. Pulmonic valve regurgitation is not visualized. Aorta: The aortic root is normal in size and structure. Venous: The inferior vena cava is dilated in size with less than 50% respiratory variability, suggesting right atrial pressure of 15 mmHg. IAS/Shunts: No atrial level shunt detected  by color flow Doppler. Additional Comments: There is a large pleural effusion.  LEFT VENTRICLE PLAX 2D LVIDd:         5.00 cm LVIDs:         3.40 cm LV PW:         1.70 cm LV IVS:        1.40 cm LVOT diam:     2.00 cm LV SV:         45 LV SV Index:   24 LVOT Area:     3.14 cm  RIGHT VENTRICLE TAPSE (M-mode): 1.2 cm LEFT ATRIUM              Index        RIGHT ATRIUM  Index LA diam:        4.90 cm  2.65 cm/m   RA Area:     23.50 cm LA Vol (A2C):   149.0 ml 80.71 ml/m  RA Volume:   70.90 ml  38.41 ml/m LA Vol (A4C):   105.0 ml 56.88 ml/m LA Biplane Vol: 126.0 ml 68.25 ml/m  AORTIC VALVE LVOT Vmax:   92.90 cm/s LVOT Vmean:  61.300 cm/s LVOT VTI:    0.143 m  AORTA Ao Root diam: 3.60 cm MITRAL VALVE                TRICUSPID VALVE MV Area (PHT): 5.97 cm     TR Peak grad:   45.2 mmHg MV Decel Time: 127 msec     TR Vmax:        336.00 cm/s MV E velocity: 117.00 cm/s                             SHUNTS                             Systemic VTI:  0.14 m                             Systemic Diam: 2.00 cm Dorris Carnes MD Electronically signed by Dorris Carnes MD Signature Date/Time: 03/31/2022/4:59:18 PM    Final    DG CHEST PORT 1 VIEW  Result Date: 04/15/2022 CLINICAL DATA:  Diastolic heart failure. Abdominal pain. Altered mental status EXAM: PORTABLE CHEST 1 VIEW COMPARISON:  Two-view chest x-ray 11/05/2021 and 01/10/2022. Numerous other exams as well. FINDINGS: Enlarged cardiopericardial silhouette with sternal wires. Increasing vascular congestion and interstitial changes. Moderate left effusion. No pneumothorax. Question tiny right effusion. Overlapping cardiac leads. IMPRESSION: Increasing vascular congestion and edema. Electronically Signed   By: Jill Side M.D.   On: 04/20/2022 17:40   CT Head Wo Contrast  Result Date: 03/27/2022 CLINICAL DATA:  Delirium.  Altered mental status. EXAM: CT HEAD WITHOUT CONTRAST TECHNIQUE: Contiguous axial images were obtained from the base of the skull through the vertex  without intravenous contrast. RADIATION DOSE REDUCTION: This exam was performed according to the departmental dose-optimization program which includes automated exposure control, adjustment of the mA and/or kV according to patient size and/or use of iterative reconstruction technique. COMPARISON:  December 25, 2014. FINDINGS: Brain: No evidence of acute large vascular territory infarction, hemorrhage, hydrocephalus, extra-axial collection or mass lesion/mass effect. Patchy white matter hypodensities are nonspecific but compatible with chronic microvascular ischemic disease. Vascular: No hyperdense vessel.  Calcific atherosclerosis. Skull: No acute fracture. Sinuses/Orbits: Mild paranasal sinus mucosal thickening. Remote left medial orbital wall fracture. Other: No mastoid effusions. IMPRESSION: No evidence of acute intracranial abnormality. Electronically Signed   By: Margaretha Sheffield M.D.   On: 04/10/2022 14:48   CT ABDOMEN PELVIS WO CONTRAST  Result Date: 03/27/2022 CLINICAL DATA:  Abdominal pain. Altered mental status. Recent diagnosis of pneumonia. EXAM: CT ABDOMEN AND PELVIS WITHOUT CONTRAST TECHNIQUE: Multidetector CT imaging of the abdomen and pelvis was performed following the standard protocol without IV contrast. RADIATION DOSE REDUCTION: This exam was performed according to the departmental dose-optimization program which includes automated exposure control, adjustment of the mA and/or kV according to patient size and/or use of iterative reconstruction technique. COMPARISON:  07/22/2008. FINDINGS: Lower chest: Heart mildly enlarged. Dense three-vessel coronary artery  calcifications. Previous CABG surgery. No pericardial effusion. Small left and trace right pleural effusions. Calcified pleural plaque posteriorly at the right lung base. Left lung base, predominantly lower lobe, atelectasis. Bilateral interstitial thickening with intervening hazy airspace opacities suspected to be pulmonary edema.  Hepatobiliary: Normal liver. Dependent gallstone. No findings of acute cholecystitis. No bile duct dilation. Pancreas: Unremarkable. No pancreatic ductal dilatation or surrounding inflammatory changes. Spleen: Normal in size without focal abnormality. Adrenals/Urinary Tract: 1.1 cm low-attenuation left adrenal mass, average Hounsfield units of 9, consistent with an adenoma, with without significant change. No follow-up recommended. Normal right adrenal gland. Bilateral renal cortical thinning. A few small low-attenuation renal masses consistent with cysts. No follow-up recommended. Nonobstructing stone in the midpole the right kidney. No hydronephrosis. Ureters normal in course and in caliber. Bladder is unremarkable. Stomach/Bowel: Stomach is within normal limits. Appendix appears normal. No evidence of bowel wall thickening, distention, or inflammatory changes. Vascular/Lymphatic: Dense aortic atherosclerotic calcifications. No aneurysm. No enlarged lymph nodes. Reproductive: Unremarkable. Other: Small amount of ascites.  Diffuse subcutaneous edema. Musculoskeletal: No fracture or acute finding.  No bone lesion. IMPRESSION: 1. Lung base findings consistent with congestive heart failure with pulmonary edema, small left and trace right pleural effusions. Additional left lung base opacity is consistent with atelectasis. A component of pneumonia is not excluded, however. 2. Small amount of ascites and diffuse subcutaneous edema consistent with anasarca. 3. No convincing acute abnormality within the abdomen or pelvis. 4. Cholelithiasis. Small nonobstructing stone the right kidney. Dense aortic atherosclerotic calcifications. Electronically Signed   By: Lajean Manes M.D.   On: 04/01/2022 13:21    Assessment: Donald Gaines. Douthat is a 82 year old male with a history of  IDA due to chronic GI blood loss in setting of gastric and small bowel AVMS, COPD, DM, HTN, TIA, Afib not anticoagulated due to recurrent GI bleeding,  CAD, who presented 1/8 with hypothermia and hypoxia on room air, as well as AMS. GI consulted for Hgb 7.1, down from 9 range in Aug 2023 when last hospitalized. Admitted with acute on chronic heart failure, acute metabolic encephalopathy, blood cultures negative thus far.  Anemia: hgb 7.1 on admission, down to 6.5 this morning.  Ferritin low at 20, iron 133, TIBC 439, sat 30%. anemia likely multifactorial in setting of acute illness, known AVMs.  No hematochezia or melena per nursing staff. Started on octreotide 18mg BID yesterday. Previously Recommended outpatient EGD w/push enteroscopy for duodenal polyp removal/possible colonoscopy as well since last was in 2014, however, with ongoing decline in hgb, he may benefit from EGD w/push enteroscopy while inpatient as anemia is likely secondary to small bowel bleeding source given known multiple AVMs on last evaluation. He remains on 6L humidified supplemental O2, BNP is up to 1000 today, some retractions noted on expiration. Will make NPO at midnight, reassess in the am to see if respiratory status is more stable for potential EGD with push enteroscopy tomorrow. Nursing staff and hospitalist made aware of plan, attempting to wean down on oxygen requirements today.   Plan: PPI daily Monitor for overt GI bleeding Continue octreotide 1036m BID Trend h&h, transfuse for hgb <7 Reassess in the am for possible EGD with push enteroscopy depending on respiratory status  NPO midnight   LOS: 2 days    04/01/2022, 10:15 AM  Natesha Hassey L. CaAlver SorrowMSN, APRN, AGNP-C Adult-Gerontology Nurse Practitioner [facility]

## 2022-04-02 ENCOUNTER — Inpatient Hospital Stay (HOSPITAL_COMMUNITY): Payer: Medicare HMO | Admitting: Certified Registered"

## 2022-04-02 ENCOUNTER — Inpatient Hospital Stay (HOSPITAL_COMMUNITY): Payer: Medicare HMO

## 2022-04-02 ENCOUNTER — Encounter (HOSPITAL_COMMUNITY): Admission: EM | Disposition: E | Payer: Self-pay | Source: Skilled Nursing Facility | Attending: Family Medicine

## 2022-04-02 ENCOUNTER — Encounter (HOSPITAL_COMMUNITY): Payer: Self-pay | Admitting: Internal Medicine

## 2022-04-02 DIAGNOSIS — I2510 Atherosclerotic heart disease of native coronary artery without angina pectoris: Secondary | ICD-10-CM

## 2022-04-02 DIAGNOSIS — I5033 Acute on chronic diastolic (congestive) heart failure: Secondary | ICD-10-CM | POA: Diagnosis not present

## 2022-04-02 DIAGNOSIS — I110 Hypertensive heart disease with heart failure: Secondary | ICD-10-CM

## 2022-04-02 DIAGNOSIS — I252 Old myocardial infarction: Secondary | ICD-10-CM | POA: Diagnosis not present

## 2022-04-02 DIAGNOSIS — I5030 Unspecified diastolic (congestive) heart failure: Secondary | ICD-10-CM

## 2022-04-02 DIAGNOSIS — K31811 Angiodysplasia of stomach and duodenum with bleeding: Secondary | ICD-10-CM | POA: Diagnosis not present

## 2022-04-02 DIAGNOSIS — K317 Polyp of stomach and duodenum: Secondary | ICD-10-CM

## 2022-04-02 DIAGNOSIS — K921 Melena: Secondary | ICD-10-CM | POA: Diagnosis not present

## 2022-04-02 DIAGNOSIS — Z87891 Personal history of nicotine dependence: Secondary | ICD-10-CM

## 2022-04-02 HISTORY — PX: ESOPHAGOGASTRODUODENOSCOPY (EGD) WITH PROPOFOL: SHX5813

## 2022-04-02 HISTORY — PX: POLYPECTOMY: SHX5525

## 2022-04-02 HISTORY — PX: ENTEROSCOPY: SHX5533

## 2022-04-02 LAB — BASIC METABOLIC PANEL
Anion gap: 11 (ref 5–15)
BUN: 72 mg/dL — ABNORMAL HIGH (ref 8–23)
CO2: 31 mmol/L (ref 22–32)
Calcium: 8.2 mg/dL — ABNORMAL LOW (ref 8.9–10.3)
Chloride: 101 mmol/L (ref 98–111)
Creatinine, Ser: 3.9 mg/dL — ABNORMAL HIGH (ref 0.61–1.24)
GFR, Estimated: 15 mL/min — ABNORMAL LOW (ref >60)
Glucose, Bld: 114 mg/dL — ABNORMAL HIGH (ref 70–99)
Potassium: 4.7 mmol/L (ref 3.5–5.1)
Sodium: 143 mmol/L (ref 135–145)

## 2022-04-02 LAB — CBC
HCT: 27.2 % — ABNORMAL LOW (ref 39.0–52.0)
Hemoglobin: 8 g/dL — ABNORMAL LOW (ref 13.0–17.0)
MCH: 30.5 pg (ref 26.0–34.0)
MCHC: 29.4 g/dL — ABNORMAL LOW (ref 30.0–36.0)
MCV: 103.8 fL — ABNORMAL HIGH (ref 80.0–100.0)
Platelets: 236 10*3/uL (ref 150–400)
RBC: 2.62 MIL/uL — ABNORMAL LOW (ref 4.22–5.81)
RDW: 18.5 % — ABNORMAL HIGH (ref 11.5–15.5)
WBC: 6.5 10*3/uL (ref 4.0–10.5)
nRBC: 0 % (ref 0.0–0.2)

## 2022-04-02 LAB — GLUCOSE, CAPILLARY
Glucose-Capillary: 134 mg/dL — ABNORMAL HIGH (ref 70–99)
Glucose-Capillary: 114 mg/dL — ABNORMAL HIGH (ref 70–99)
Glucose-Capillary: 113 mg/dL — ABNORMAL HIGH (ref 70–99)
Glucose-Capillary: 127 mg/dL — ABNORMAL HIGH (ref 70–99)

## 2022-04-02 SURGERY — ESOPHAGOGASTRODUODENOSCOPY (EGD) WITH PROPOFOL
Anesthesia: General

## 2022-04-02 MED ORDER — DILTIAZEM HCL 25 MG/5ML IV SOLN
10.0000 mg | Freq: Once | INTRAVENOUS | Status: AC
  Administered 2022-04-02: 10 mg via INTRAVENOUS
  Filled 2022-04-02: qty 5

## 2022-04-02 MED ORDER — PERFLUTREN LIPID MICROSPHERE
1.0000 mL | INTRAVENOUS | Status: AC | PRN
  Administered 2022-04-02: 5 mL via INTRAVENOUS

## 2022-04-02 MED ORDER — EPHEDRINE SULFATE-NACL 50-0.9 MG/10ML-% IV SOSY
PREFILLED_SYRINGE | INTRAVENOUS | Status: DC | PRN
  Administered 2022-04-02: 15 mg via INTRAVENOUS

## 2022-04-02 MED ORDER — GLYCOPYRROLATE PF 0.2 MG/ML IJ SOSY
PREFILLED_SYRINGE | INTRAMUSCULAR | Status: DC | PRN
  Administered 2022-04-02: .2 mg via INTRAVENOUS

## 2022-04-02 MED ORDER — LACTATED RINGERS IV SOLN: INTRAVENOUS | Status: DC | PRN

## 2022-04-02 MED ORDER — METOPROLOL TARTRATE 5 MG/5ML IV SOLN
3.0000 mg | Freq: Once | INTRAVENOUS | Status: AC
  Administered 2022-04-02: 3 mg via INTRAVENOUS

## 2022-04-02 MED ORDER — SODIUM CHLORIDE 0.9 % IV SOLN: INTRAVENOUS | Status: DC

## 2022-04-02 MED ORDER — ESMOLOL HCL 100 MG/10ML IV SOLN
INTRAVENOUS | Status: DC | PRN
  Administered 2022-04-02: 5 mg via INTRAVENOUS
  Administered 2022-04-02 (×2): 20 mg via INTRAVENOUS
  Administered 2022-04-02: 5 mg via INTRAVENOUS

## 2022-04-02 MED ORDER — METOPROLOL TARTRATE 5 MG/5ML IV SOLN
INTRAVENOUS | Status: DC | PRN
  Administered 2022-04-02: 2 mg via INTRAVENOUS

## 2022-04-02 MED ORDER — LIDOCAINE HCL (CARDIAC) PF 100 MG/5ML IV SOSY
PREFILLED_SYRINGE | INTRAVENOUS | Status: DC | PRN
  Administered 2022-04-02: 100 mg via INTRATRACHEAL

## 2022-04-02 MED ORDER — PHENYLEPHRINE 80 MCG/ML (10ML) SYRINGE FOR IV PUSH (FOR BLOOD PRESSURE SUPPORT)
PREFILLED_SYRINGE | INTRAVENOUS | Status: DC | PRN
  Administered 2022-04-02 (×3): 80 ug via INTRAVENOUS
  Administered 2022-04-02: 160 ug via INTRAVENOUS
  Administered 2022-04-02: 80 ug via INTRAVENOUS

## 2022-04-02 MED ORDER — GLUCAGON HCL RDNA (DIAGNOSTIC) 1 MG IJ SOLR
INTRAMUSCULAR | Status: DC | PRN
  Administered 2022-04-02 (×2): .5 mg via INTRAVENOUS

## 2022-04-02 MED ORDER — PROPOFOL 10 MG/ML IV BOLUS
INTRAVENOUS | Status: DC | PRN
  Administered 2022-04-02: 70 mg via INTRAVENOUS
  Administered 2022-04-02 (×2): 30 mg via INTRAVENOUS

## 2022-04-02 NOTE — Interval H&P Note (Signed)
History and Physical Interval Note:  04/01/2022 11:08 AM  Donald Gaines  has presented today for surgery, with the diagnosis of iron deficiency anemia due to chronic blood loss, gastric and duodenal arteriovenous malformations, duodenal polyp.  The various methods of treatment have been discussed with the patient and family. After consideration of risks, benefits and other options for treatment, the patient has consented to  Procedure(s): ESOPHAGOGASTRODUODENOSCOPY (EGD) WITH PROPOFOL (N/A) ENTEROSCOPY (N/A) as a surgical intervention.  The patient's history has been reviewed, patient examined, no change in status, stable for surgery.  I have reviewed the patient's chart and labs.  Questions were answered to the patient's satisfaction.       Patient stable overnight.  1 unit packed RBCs transfused.  Hemoglobin this morning 8.  Plan for EGD/enteroscopy with ablation of AVMs or other bleeding lesions.  Also, plan to remove hemorrhagic duodenal polyp  seen on prior examination.The risks, benefits, limitations, alternatives and imponderables have been reviewed with the patient. Potential for esophageal dilation, biopsy, etc. have also been reviewed.  Questions have been answered. All parties agreeable.  Manus Rudd

## 2022-04-02 NOTE — Op Note (Signed)
[facility] Patient Name: Donald Gaines Procedure Date: 04/13/2022 10:37 AM MRN: [id number] Date of Birth: March 27, 1940 Attending MD: Norvel Richards , MD, [id number] CSN: [id number] Age: 82 Admit Type: Outpatient Procedure:                Small bowel enteroscopy Indications:              Melena Providers:                Norvel Richards, MD, Janeece Riggers, RN, Everardo Pacific Referring MD:              Medicines:                Propofol per Anesthesia Complications:            No immediate complications. Estimated Blood Loss:     Estimated blood loss: none. Procedure:                Pre-Anesthesia Assessment:                           - Prior to the procedure, a History and Physical                            was performed, and patient medications and                            allergies were reviewed. The patient's tolerance of                            previous anesthesia was also reviewed. The risks                            and benefits of the procedure and the sedation                            options and risks were discussed with the patient.                            All questions were answered, and informed consent                            was obtained. Prior Anticoagulants: The patient has                            taken no anticoagulant or antiplatelet agents. ASA                            Grade Assessment: III - A patient with severe                            systemic disease. After reviewing the risks and  benefits, the patient was deemed in satisfactory                            condition to undergo the procedure.                           After obtaining informed consent, the endoscope was                            passed under direct vision. Throughout the                            procedure, the patient's blood pressure, pulse, and                            oxygen saturations were  monitored continuously. The                            [id number]-HQ190L ([id number]) scope was introduced through                            the mouth and advanced to the mid-jejunum. The                            small bowel enteroscopy was accomplished without                            difficulty. The patient tolerated the procedure                            well. Scope In: 11:22:32 AM Scope Out: 11:53:55 AM Total Procedure Duration: 0 hours 31 minutes 23 seconds  Findings:      Esophagus appeared normal. Gastric cavity empty. Some for       scale/snakeskin appearance of the gastric mucosa involving primarily the       body. No blood in the stomach. No bleeding lesion seen. Pylorus patent.      The pediatric colonoscope was advanced across the pylorus down to the       mid jejunum. Previously noted pedunculated polyp in the proximal       duodenum was again seen. Initially had a hemorrhagic appearance. From       the mid jejunum, scope was slowly withdrawn all previous imaging because       services were again seen. 0.5 mg of glucagon IV was given to arrest       small bowel motility. The scope was pulled to the proximal duodenum the       polyp was again identified. It initially seemed that the polyp was       hemorrhagic but I noted fresh blood coming from the opposite wall.       Please see photos. The polyp was resected with a hot snare. At the same       time, patient become unstable with hypotension and hypoxemia. The       procedure was interrupted. I pulled the scope out of the patient.       Anesthesia intervened and stabilized the patient. I  resumed the       procedure using the gastroscope. I advanced the scope down into the       proximal duodenum. Polypectomy site retracted. I had not yet retrieve       the polyp; I did find it in the gastric fundus retreated with a rescue       net. I went back and inspected the duodenum further. There was an       AVM?"suspect bleeder across  from the site of the polypectomy - it was       ablated with APC circular probe 20 J each. Good hemostasis. It was my       plan to place a hemostasis clip on the polypectomy site but I elected       not to do that as I had trouble identifying the polypectomy site and       there was no bleeding. I wanted to wrap the procedure up as quickly as       possible given patient's instability during the procedure. Impression:               Abnormal gastric mucosa of uncertain significance.                            Proximal duodenal AVM active bleeding?"sealed with                            APC. Pedunculated polyp proximal duodenum -                            resected with hot snare and recovered                           Small bowel mucosa from the distal duodenum through                            the mid jejunum appeared normal. Moderate Sedation:      Moderate (conscious) sedation was personally administered by an       anesthesia professional. The following parameters were monitored: oxygen       saturation, heart rate, blood pressure, respiratory rate, EKG, adequacy       of pulmonary ventilation, and response to care. Recommendation:           - Return patient to ICU for ongoing care. Clear                            liquid diet. Trend H&H. Follow-up on polyp                            pathology. Further recommendations to follow. At                            patient request, I called [name] at                            [phone] unable to reach. Procedure Code(s):        --- Professional ---  [phone], Small intestinal endoscopy, enteroscopy                            beyond second portion of duodenum, not including                            ileum; diagnostic, including collection of                            specimen(s) by brushing or washing, when performed                            (separate procedure) Diagnosis Code(s):        ---  Professional ---                           K92.1, Melena (includes Hematochezia) CPT copyright 2022 American Medical Association. All rights reserved. The codes documented in this report are preliminary and upon coder review may  be revised to meet current compliance requirements. Cristopher Estimable. Shahed Yeoman, MD Norvel Richards, MD 04/22/2022 12:16:12 PM This report has been signed electronically. Number of Addenda: 0

## 2022-04-02 NOTE — Progress Notes (Addendum)
Patient briefly seen today prior to ECHO. He is alert and oriented to self, place today. Irregularly irregular rhythm. Bilateral faint rales/scattered rhonchi bases. O2 sats above 95% on 3L Donald Gaines. No abdominal tenderness, dependent pitting edema noted. Spoke with Dr. Denton Brick who reports patient should be stable for endoscopy. Spoke to sister, Donald Gaines, she is agreeable for EGD with enteroscopy. She wants everything done for him that is needed. She is willing to provide verbal consent if needed. Tried to call sister, Donald Gaines, but number gave error that it could not be reached and try again later.   Patient's post transfusion H/H to be drawn, spoke to [employer]   Proceed with EGD/enteroscopy.  [name]. [name] [facility] [phone] 1/11/20249:46 AM

## 2022-04-02 NOTE — Progress Notes (Signed)
*  PRELIMINARY RESULTS* Echocardiogram 2D Echocardiogram has been performed.  Donald Gaines 03/27/2022, 9:45 AM

## 2022-04-02 NOTE — Anesthesia Postprocedure Evaluation (Signed)
Anesthesia Post Note  Patient: Donald Gaines  Procedure(s) Performed: ESOPHAGOGASTRODUODENOSCOPY (EGD) WITH PROPOFOL ENTEROSCOPY POLYPECTOMY  Patient location during evaluation: PACU Anesthesia Type: General Level of consciousness: awake and alert Pain management: pain level controlled Vital Signs Assessment: post-procedure vital signs reviewed and stable Respiratory status: spontaneous breathing, nonlabored ventilation, respiratory function stable and patient connected to nasal cannula oxygen Cardiovascular status: blood pressure returned to baseline, stable and tachycardic (afib) Postop Assessment: no apparent nausea or vomiting Anesthetic complications: no   There were no known notable events for this encounter.   Last Vitals:  Vitals:   04/09/2022 1221 04/19/2022 1230  BP: 102/69 (!) 83/61  Pulse:  90  Resp: 20 17  Temp:    SpO2: 91% 94%    Last Pain:  Vitals:   04/05/2022 1159  TempSrc:   PainSc: Asleep                 Trixie Rude

## 2022-04-02 NOTE — Transfer of Care (Signed)
Immediate Anesthesia Transfer of Care Note  Patient: Donald Gaines  Procedure(s) Performed: ESOPHAGOGASTRODUODENOSCOPY (EGD) WITH PROPOFOL ENTEROSCOPY POLYPECTOMY  Patient Location: PACU  Anesthesia Type:General  Level of Consciousness: awake, alert , oriented, and patient cooperative  Airway & Oxygen Therapy: Patient Spontanous Breathing and Patient connected to nasal cannula oxygen  Post-op Assessment: Report given to RN, Post -op Vital signs reviewed and unstable, Anesthesiologist notified, and Afib with RVR. BP stable. Monitoring and treating per chart.   Post vital signs: Reviewed  Last Vitals:  Vitals Value Taken Time  BP 102/69 04/18/2022 1221  Temp    Pulse 143 04/14/2022 1215  Resp 20 03/29/2022 1221  SpO2 90 % 04/15/2022 1215  Vitals shown include unvalidated device data.  Last Pain:  Vitals:   04/08/2022 1159  TempSrc:   PainSc: Asleep         Complications: No notable events documented.

## 2022-04-02 NOTE — Progress Notes (Signed)
Per Pamala Hurry, charge nurse, Dr. Joesph Fillers is reversing the order for ICU and pt will be going back to 300.

## 2022-04-02 NOTE — Progress Notes (Signed)
Notified Dr. Joesph Fillers that patient's HR was up in the 150s. Patient had taken his oxygen off and dropped into the 70s saturation. Once oxygen levels back up HR in the 90-100s

## 2022-04-02 NOTE — Progress Notes (Signed)
   04/05/2022 1833  Assess: MEWS Score  BP 112/79  MAP (mmHg) 90  Pulse Rate (!) 110  Resp (!) 25  Level of Consciousness Alert  SpO2 94 %  O2 Device Nasal Cannula  O2 Flow Rate (L/min) 3 L/min  Assess: MEWS Score  MEWS Temp 0  MEWS Systolic 0  MEWS Pulse 1  MEWS RR 1  MEWS LOC 0  MEWS Score 2  MEWS Score Color Yellow  Assess: if the MEWS score is Yellow or Red  Were vital signs taken at a resting state? Yes  Focused Assessment No change from prior assessment  Does the patient meet 2 or more of the SIRS criteria? No  MEWS guidelines implemented *See Row Information* Yes  Take Vital Signs  Increase Vital Sign Frequency  Yellow: Q 2hr X 2 then Q 4hr X 2, if remains yellow, continue Q 4hrs  Escalate  MEWS: Escalate Yellow: discuss with charge nurse/RN and consider discussing with provider and RRT  Notify: Charge Nurse/RN  Name of Charge Nurse/RN Notified MaryAnn RN  Date Charge Nurse/RN Notified 03/24/2022  Time Charge Nurse/RN Notified 1833 (present in room)  Provider Notification  Provider Name/Title Courage Emokpae  Date Provider Notified 04/16/2022  Time Provider Notified 1835  Method of Notification Call  Notification Reason Critical Result  Provider response No new orders  Assess: SIRS CRITERIA  SIRS Temperature  0  SIRS Pulse 1  SIRS Respirations  1  SIRS WBC 0  SIRS Score Sum  2

## 2022-04-02 NOTE — Progress Notes (Signed)
Palliative: Attempt to see Mr. Morten but he is off the floor in the Endo suite.   Conference with bedside nursing staff and charge nurse. PMT to continue to follow.  No charge Quinn Axe, NP Palliative medicine team Team phone [phone] Greater than 50% of this time was spent counseling and coordinating care related to the above assessment and plan.

## 2022-04-02 NOTE — Progress Notes (Signed)
Progress Note   Patient: Donald Gaines KAJ:[id number] DOB: 10-Apr-1940 DOA: 04/22/2022     3 DOS: the patient was seen and examined on 03/31/2022   Brief hospital course: As per H&P written by Dr. Denton Brick on 04/21/2022 Donald Gaines is a 82 y.o. male with medical history significant for diabetes mellitus, atrial fibrillation, COPD, CHF, coronary artery disease, restrictive lung disease. Patient was brought to the ED with complaints of altered mental status and abdominal pain.  Per charge nurse, he was diagnosed with pneumonia last week and has been on antibiotics.  At the time of my evaluation, patient is somnolent, arouses, able to tell me his name but not answering questions.   ED Course: Hypothermic temperature 94.6.  Heart rate 76-94, respiratory rate 10-18.  Blood pressure systolic 1 03-559.  O2 sats 93 to 100% on 2 L. BNP elevated at 901. CT abdomen and pelvis lung base consistent with CHF, pulmonary edema, left lung base atelectasis, pneumonia not excluded, small amount of ascites and diffuse subcutaneous edema consistent with anasarca. Head CT negative for acute abnormality. 1 L bolus initially given, Protonix, morphine 4 mg, then 40 mg Lasix given. Hospitalist to admit for decompensated CHF and encephalopathy.  Assessment and Plan: 1)HFpEF/Acute on chronic diastolic (congestive) heart failure (HCC) -Presenting with abdominal distention, CT showing small ascites, diffuse subcutaneous edema, + trace bilateral lower extremity edema.  BNP elevated at 901.   -Currently on 3 L  -Echo shows-EF of 60 to 65%,  shows significant hypertrophy with basal lateral  hypokinesis and basal inferior hypokinesis.  Severe LVH, consider other imaging modality (MRI) to evaluate for possible amyloid, when clinically improved.  Severe left atrial enlargement and moderate left atrial enlargement, no aortic stenosis -continue IV Lasix, continue metoprolol. -Follow strict I's and O's and daily weights.     2)Acute metabolic encephalopathy - Head CT negative for acute abnormality.  -Hypothermia has resolved  -No evidence of acute infection -??  If uremia related with worsening renal function -Patient reported recently finishing a week course of oral antibiotic for outpatient pneumonia.  -Continue supportive care and hold on further antibiotics. -continue Holding Xanax, gabapentin  3)Acute on chronic iron deficiency anemia due to chronic blood loss -Hgb is up to 8.0 from 6.5 after transfusion of 1 unit of PRBC on 04/01/2022 -No overt bleeding appreciated. -History of chronic GI blood loss-gastric and small bowel AVM's.  Push enteroscopy 11/07/2021 - several nonbleeding angioplastic in duodenum, treated with APC, clip.  Normal endoscopy 11/06/2021.   EGD on 03/25/2022 shows  Abnormal gastric mucosa of uncertain significance, Proximal duodenal AVM active bleeding?"sealed with APC. Pedunculated polyp proximal duodenum resected  -GI recommends clear liquid diet for now -Continue to monitor H&H -Continue IV Protonix  4)Chronic kidney disease (CKD), stage IV (severe) (HCC) -Creatinine trending up currently 3.73, baseline usually just under 3 -Monitor  renal function closely with diuresis - renally adjust medications, avoid nephrotoxic agents / dehydration  / hypotension  5)Permanent atrial fibrillation -post endoscopy patient went into RVR -Continue metoprolol for rate control -Severe left atrial and moderate right atrial enlargement noted -EF is preserved as above 04/22/2022 -Will add IV Cardizem for rate control temporarily as patient is now tachycardic/RVR after endoscopic procedure -No anticoagulation secondary to GIB.  6)COPD (chronic obstructive pulmonary disease) (HCC) -Stable overall. -continue home bronchodilator regimen  7)Essential hypertension -Metoprolol and Lasix  8)Type 2 diabetes mellitus (HCC) -A1c 5.4.   -d/c metformin at discharge Use Novolog/Humalog Sliding scale  insulin with Accu-Cheks/Fingersticks as  ordered   9)Generalized weakness and deconditioning--- get physical therapy eval  Subjective:  --Patient went into RVR/tachycardia--after endoscopy procedure -Soft BP noted -Tachypnea and hypoxia noted post endoscopy procedure  Physical Exam: Vitals:   03/31/2022 1300 03/27/2022 1315 03/29/2022 1321 04/14/2022 1330  BP: 100/77 103/74 91/66 107/64  Pulse:  (!) 101  (!) 102  Resp: 15 (!) 35 (!) 25 15  Temp:      TempSrc:      SpO2: 90% (!) 89%  93%  Weight:      Height:       General exam: Alert, awake, oriented x 3 .no acute distress  HEENT -[place] 2L/min Lungs-lungs, no wheezing, faint bibasilar rales Heart-S1-S2, irregularly irregular and tachycardic Gastrointestinal system: Abdomen is nondistended, soft and nontender. +BS Central nervous system: Generalized weakness, no focal neurological deficits. Extremities:-Good pedal pulses, pitting edema noted Psychiatry: Mood & affect appropriate, more oriented and more coherent GU-Foley to be removed on 04/01/2022  Family Communication: No family bedside. -Patient's sister Aryaman [place] -Unable to reach patient's other sister [name], but number gave error that it could not be reached    Disposition: [facility] in 1 to 2 days Status is: Inpatient Remains inpatient appropriate because: Continue IV diuresis for CHF exacerbation.  Author: Roxan Hockey, MD 04/10/2022 1:41 PM  For on call review [email or web address].

## 2022-04-02 NOTE — Plan of Care (Signed)
?  Problem: Education: ?Goal: Ability to demonstrate management of disease process will improve ?Outcome: Progressing ?  ?

## 2022-04-02 NOTE — TOC Progression Note (Signed)
Transition of Care [facility]) - Progression Note    Patient Details  Name: LUCAS WINOGRAD MRN: [id number] Date of Birth: 17-Aug-1940  Transition of Care [facility]) CM/SW Contact  Boneta Lucks, RN Phone Number: 04/22/2022, 11:51 AM  Clinical Narrative:   Patient getting scope today. From [facility]. PT eval pending. FL2 started.    Expected Discharge Plan: Assisted Living Barriers to Discharge: Continued Medical Work up  Expected Discharge Plan and Services          Social Determinants of Health (SDOH) Interventions SDOH Screenings   Food Insecurity: No Food Insecurity (01/26/2022)  Recent Concern: [place] Present (12/22/2021)  Housing: Low Risk  (01/26/2022)  Transportation Needs: No Transportation Needs (01/26/2022)  Utilities: Not At Risk (01/26/2022)  Alcohol Screen: Low Risk  (01/26/2022)  Depression (PHQ2-9): Low Risk  (01/26/2022)  Financial Resource Strain: Low Risk  (01/26/2022)  Physical Activity: Inactive (01/26/2022)  Social Connections: Moderately Integrated (01/26/2022)  Stress: No Stress Concern Present (01/26/2022)  Tobacco Use: High Risk (03/29/2022)    Readmission Risk Interventions     No data to display

## 2022-04-02 NOTE — Anesthesia Preprocedure Evaluation (Addendum)
Anesthesia Evaluation  Patient identified by MRN, date of birth, ID band Patient awake    Reviewed: Allergy & Precautions, NPO status , Patient's Chart, lab work & pertinent test results, reviewed documented beta blocker date and time   Airway Mallampati: II  TM Distance: >3 FB Neck ROM: Full    Dental  (+) Edentulous Upper, Edentulous Lower   Pulmonary sleep apnea, Continuous Positive Airway Pressure Ventilation and Oxygen sleep apnea , pneumonia, COPD,  COPD inhaler and oxygen dependent, Current Smoker and Patient abstained from smoking.  Coarse breath sounds         Cardiovascular Exercise Tolerance: Poor hypertension, Pt. on medications and Pt. on home beta blockers + CAD, + Past MI, + Cardiac Stents, + CABG and +CHF  Normal cardiovascular exam+ dysrhythmias Atrial Fibrillation  Rhythm:Regular Rate:Normal  05-Nov-2021 14:12:36 [facility] ROUTINE RECORD September 16, 1940 (67 yr) Male Caucasian Vent. rate 84 BPM PR interval ms QRS duration 114 ms QT/QTcB 414/490 ms P-R-T axes  34 224 Atrial fibrillation Incomplete left bundle branch block Borderline prolonged QT interval   Neuro/Psych  PSYCHIATRIC DISORDERS  Depression   Dementia TIA   GI/Hepatic Neg liver ROS,GERD  Medicated and Controlled,,  Endo/Other  diabetes, Well Controlled, Type 2, Oral Hypoglycemic Agents    Renal/GU Renal InsufficiencyRenal disease  negative genitourinary   Musculoskeletal negative musculoskeletal ROS (+)    Abdominal   Peds negative pediatric ROS (+)  Hematology  (+) Blood dyscrasia, anemia Received 1 unit PRBC today   Anesthesia Other Findings EKG no change  Reproductive/Obstetrics negative OB ROS                             Anesthesia Physical Anesthesia Plan  ASA: 4 and emergent  Anesthesia Plan: General   Post-op Pain Management: Minimal or no pain anticipated   Induction:  Intravenous  PONV Risk Score and Plan: Propofol infusion  Airway Management Planned: Nasal Cannula and Natural Airway  Additional Equipment:   Intra-op Plan:   Post-operative Plan:   Informed Consent: I have reviewed the patients History and Physical, chart, labs and discussed the procedure including the risks, benefits and alternatives for the proposed anesthesia with the patient or authorized representative who has indicated his/her understanding and acceptance.       Plan Discussed with: CRNA  Anesthesia Plan Comments: (In case of uncontrolled bleeding it is ok to intubate for brief period, brief resuscitation of does not want prolonged resuscitation for cardiac arrest. )        Anesthesia Quick Evaluation

## 2022-04-03 ENCOUNTER — Inpatient Hospital Stay (HOSPITAL_COMMUNITY): Payer: Medicare HMO

## 2022-04-03 DIAGNOSIS — K5521 Angiodysplasia of colon with hemorrhage: Secondary | ICD-10-CM | POA: Diagnosis not present

## 2022-04-03 DIAGNOSIS — D62 Acute posthemorrhagic anemia: Secondary | ICD-10-CM

## 2022-04-03 DIAGNOSIS — I5033 Acute on chronic diastolic (congestive) heart failure: Secondary | ICD-10-CM | POA: Diagnosis not present

## 2022-04-03 LAB — SURGICAL PATHOLOGY

## 2022-04-03 LAB — BLOOD GAS, ARTERIAL
Acid-base deficit: 0.1 mmol/L (ref 0.0–2.0)
Bicarbonate: 26.8 mmol/L (ref 20.0–28.0)
Drawn by: 22223
FIO2: 100 %
Patient temperature: 37
pCO2 arterial: 52 mmHg — ABNORMAL HIGH (ref 32–48)
pH, Arterial: 7.32 — ABNORMAL LOW (ref 7.35–7.45)
pO2, Arterial: 79 mmHg — ABNORMAL LOW (ref 83–108)

## 2022-04-03 LAB — GLUCOSE, CAPILLARY
Glucose-Capillary: 119 mg/dL — ABNORMAL HIGH (ref 70–99)
Glucose-Capillary: 129 mg/dL — ABNORMAL HIGH (ref 70–99)
Glucose-Capillary: 132 mg/dL — ABNORMAL HIGH (ref 70–99)
Glucose-Capillary: 140 mg/dL — ABNORMAL HIGH (ref 70–99)
Glucose-Capillary: 153 mg/dL — ABNORMAL HIGH (ref 70–99)

## 2022-04-03 LAB — BODY FLUID CELL COUNT WITH DIFFERENTIAL
Eos, Fluid: 0 %
Lymphs, Fluid: 35 %
Monocyte-Macrophage-Serous Fluid: 31 % — ABNORMAL LOW (ref 50–90)
Neutrophil Count, Fluid: 7 % (ref 0–25)
Other Cells, Fluid: 0 %
Total Nucleated Cell Count, Fluid: 48 cu mm (ref 0–1000)

## 2022-04-03 LAB — BASIC METABOLIC PANEL
Anion gap: 14 (ref 5–15)
BUN: 72 mg/dL — ABNORMAL HIGH (ref 8–23)
CO2: 28 mmol/L (ref 22–32)
Calcium: 7.9 mg/dL — ABNORMAL LOW (ref 8.9–10.3)
Chloride: 98 mmol/L (ref 98–111)
Creatinine, Ser: 4.12 mg/dL — ABNORMAL HIGH (ref 0.61–1.24)
GFR, Estimated: 14 mL/min — ABNORMAL LOW (ref >60)
Glucose, Bld: 118 mg/dL — ABNORMAL HIGH (ref 70–99)
Potassium: 4.3 mmol/L (ref 3.5–5.1)
Sodium: 140 mmol/L (ref 135–145)

## 2022-04-03 LAB — COMPREHENSIVE METABOLIC PANEL
ALT: 12 U/L (ref 0–44)
AST: 37 U/L (ref 15–41)
Albumin: 2.7 g/dL — ABNORMAL LOW (ref 3.5–5.0)
Alkaline Phosphatase: 91 U/L (ref 38–126)
Anion gap: 22 — ABNORMAL HIGH (ref 5–15)
BUN: 71 mg/dL — ABNORMAL HIGH (ref 8–23)
CO2: 18 mmol/L — ABNORMAL LOW (ref 22–32)
Calcium: 7.8 mg/dL — ABNORMAL LOW (ref 8.9–10.3)
Chloride: 100 mmol/L (ref 98–111)
Creatinine, Ser: 4.46 mg/dL — ABNORMAL HIGH (ref 0.61–1.24)
GFR, Estimated: 13 mL/min — ABNORMAL LOW (ref >60)
Glucose, Bld: 201 mg/dL — ABNORMAL HIGH (ref 70–99)
Potassium: 3.8 mmol/L (ref 3.5–5.1)
Sodium: 140 mmol/L (ref 135–145)
Total Bilirubin: 0.8 mg/dL (ref 0.3–1.2)
Total Protein: 6.1 g/dL — ABNORMAL LOW (ref 6.5–8.1)

## 2022-04-03 LAB — CBC
HCT: 27.8 % — ABNORMAL LOW (ref 39.0–52.0)
HCT: 32.9 % — ABNORMAL LOW (ref 39.0–52.0)
Hemoglobin: 8.1 g/dL — ABNORMAL LOW (ref 13.0–17.0)
Hemoglobin: 9.1 g/dL — ABNORMAL LOW (ref 13.0–17.0)
MCH: 30.3 pg (ref 26.0–34.0)
MCH: 30.1 pg (ref 26.0–34.0)
MCHC: 29.1 g/dL — ABNORMAL LOW (ref 30.0–36.0)
MCHC: 27.7 g/dL — ABNORMAL LOW (ref 30.0–36.0)
MCV: 104.1 fL — ABNORMAL HIGH (ref 80.0–100.0)
MCV: 108.9 fL — ABNORMAL HIGH (ref 80.0–100.0)
Platelets: 247 10*3/uL (ref 150–400)
Platelets: 244 10*3/uL (ref 150–400)
RBC: 2.67 MIL/uL — ABNORMAL LOW (ref 4.22–5.81)
RBC: 3.02 MIL/uL — ABNORMAL LOW (ref 4.22–5.81)
RDW: 18.3 % — ABNORMAL HIGH (ref 11.5–15.5)
RDW: 18.1 % — ABNORMAL HIGH (ref 11.5–15.5)
WBC: 6.1 10*3/uL (ref 4.0–10.5)
WBC: 10.7 10*3/uL — ABNORMAL HIGH (ref 4.0–10.5)
nRBC: 0 % (ref 0.0–0.2)
nRBC: 1.5 % — ABNORMAL HIGH (ref 0.0–0.2)

## 2022-04-03 LAB — PROTIME-INR
INR: 1.4 — ABNORMAL HIGH (ref 0.8–1.2)
Prothrombin Time: 17.2 seconds — ABNORMAL HIGH (ref 11.4–15.2)

## 2022-04-03 LAB — ECHOCARDIOGRAM COMPLETE
AR max vel: 2.74 cm2
AV Area VTI: 2.4 cm2
AV Area mean vel: 2.37 cm2
AV Mean grad: 3 mmHg
AV Peak grad: 6.4 mmHg
Ao pk vel: 1.27 m/s
Area-P 1/2: 4.04 cm2
MV VTI: 2.89 cm2
S' Lateral: 3.3 cm

## 2022-04-03 LAB — LACTATE DEHYDROGENASE, PLEURAL OR PERITONEAL FLUID: LD, Fluid: 91 U/L — ABNORMAL HIGH (ref 3–23)

## 2022-04-03 LAB — PROTEIN, PLEURAL OR PERITONEAL FLUID: Total protein, fluid: <3 g/dL

## 2022-04-03 LAB — GLUCOSE, PLEURAL OR PERITONEAL FLUID: Glucose, Fluid: 130 mg/dL

## 2022-04-03 MED ORDER — PROPOFOL 1000 MG/100ML IV EMUL
0.0000 ug/kg/min | INTRAVENOUS | Status: DC
  Administered 2022-04-03: 5 ug/kg/min via INTRAVENOUS
  Administered 2022-04-04: 50 ug/kg/min via INTRAVENOUS
  Filled 2022-04-03 (×4): qty 100

## 2022-04-03 MED ORDER — FENTANYL CITRATE PF 50 MCG/ML IJ SOSY: 25.0000 ug | PREFILLED_SYRINGE | INTRAMUSCULAR | Status: DC | PRN

## 2022-04-03 MED ORDER — ALBUTEROL SULFATE (2.5 MG/3ML) 0.083% IN NEBU
2.5000 mg | INHALATION_SOLUTION | Freq: Four times a day (QID) | RESPIRATORY_TRACT | Status: DC
  Administered 2022-04-03: 2.5 mg via RESPIRATORY_TRACT
  Filled 2022-04-03: qty 3

## 2022-04-03 MED ORDER — ALBUTEROL SULFATE (2.5 MG/3ML) 0.083% IN NEBU
2.5000 mg | INHALATION_SOLUTION | Freq: Three times a day (TID) | RESPIRATORY_TRACT | Status: DC
  Administered 2022-04-04: 2.5 mg via RESPIRATORY_TRACT
  Filled 2022-04-03: qty 3

## 2022-04-03 MED ORDER — FUROSEMIDE 10 MG/ML IJ SOLN
4.0000 mg/h | INTRAVENOUS | Status: DC
  Administered 2022-04-03: 4 mg/h via INTRAVENOUS
  Filled 2022-04-03: qty 20

## 2022-04-03 MED ORDER — DOCUSATE SODIUM 50 MG/5ML PO LIQD
100.0000 mg | Freq: Two times a day (BID) | ORAL | Status: DC
  Administered 2022-04-04: 100 mg
  Filled 2022-04-03: qty 10

## 2022-04-03 MED ORDER — FUROSEMIDE 10 MG/ML IJ SOLN
INTRAMUSCULAR | Status: AC
  Filled 2022-04-03: qty 20

## 2022-04-03 MED ORDER — PANTOPRAZOLE SODIUM 40 MG IV SOLR
40.0000 mg | Freq: Two times a day (BID) | INTRAVENOUS | Status: DC
  Administered 2022-04-04: 40 mg via INTRAVENOUS
  Filled 2022-04-03: qty 10

## 2022-04-03 MED ORDER — NOREPINEPHRINE 4 MG/250ML-% IV SOLN
2.0000 ug/min | INTRAVENOUS | Status: DC
  Administered 2022-04-03: 2 ug/min via INTRAVENOUS
  Administered 2022-04-04 (×2): 8 ug/min via INTRAVENOUS
  Filled 2022-04-03 (×3): qty 250

## 2022-04-03 MED ORDER — ROCURONIUM BROMIDE 10 MG/ML (PF) SYRINGE
PREFILLED_SYRINGE | INTRAVENOUS | Status: AC
  Administered 2022-04-03: 100 mg
  Filled 2022-04-03: qty 10

## 2022-04-03 MED ORDER — SODIUM CHLORIDE 0.9 % IV SOLN
250.0000 mL | INTRAVENOUS | Status: DC
  Administered 2022-04-03: 250 mL via INTRAVENOUS

## 2022-04-03 MED ORDER — POLYETHYLENE GLYCOL 3350 17 G PO PACK
17.0000 g | PACK | Freq: Every day | ORAL | Status: DC
  Administered 2022-04-04: 17 g
  Filled 2022-04-03: qty 1

## 2022-04-03 NOTE — Progress Notes (Signed)
  Social/Ethics- Intubated and sedated on 04/03/2022 Discussed with Patient's sister Denym Rahimi and patient's niece [name] face-to-face in ICU , also updated patient's  Sister [name] by phone -Patient currently intubated and sedated -Family request no further escalation of care and DNR status -They will consider compassionate extubation over the next day or 2 if patient fails to improve   Roxan Hockey, MD

## 2022-04-03 NOTE — Progress Notes (Signed)
Gastroenterology Progress Note   Referring Provider: No ref. provider found Primary Care Physician:  Celene Squibb, MD Primary Gastroenterologist:  Dr.  Patient ID: Donald Gaines; [id number]; Aug 12, 1940    Subjective   Patient disoriented to time and situation.  Does have some mild abdominal tenderness but denies any nausea or vomiting.  States his breathing is stable.  He is on 5 L nasal cannula during my examination.  Patient reports no bowel movement since yesterday.  No reports of melena or hematochezia.  Objective   Vital signs in last 24 hours Temp:  [97.3 F (36.3 C)-98.1 F (36.7 C)] 97.3 F (36.3 C) (01/12 0543) Pulse Rate:  [89-144] 100 (01/12 0736) Resp:  [12-35] 18 (01/12 0736) BP: (79-121)/(61-79) 110/74 (01/12 0543) SpO2:  [88 %-100 %] 100 % (01/12 0736) Weight:  [81.2 kg] 81.2 kg (01/12 0543)    Physical Exam General:   Alert, pleasant, altered Head:  Normocephalic and atraumatic. Eyes:  No icterus, sclera clear. Conjuctiva pink.  Heart:  irregularly irregular.  Lungs: Rhonchi bilaterally. 5L [place]. Congested cough  Abdomen:  Bowel sounds present, soft, non-distended. Generalized tenderness. No HSM or hernias noted. No rebound or guarding. No masses appreciated Neurologic:  Alert and oriented x2 (person and place), disoriented to time and situation;  grossly normal neurologically. Skin:  Warm and dry, intact without significant lesions.  Psych:  Alert and cooperative. Normal mood and affect.  Intake/Output from previous day: 01/11 0701 - 01/12 0700 In: 888 [I.V.:500; Blood:388] Out: 375 [Urine:375] Intake/Output this shift: No intake/output data recorded.  Lab Results  Recent Labs    04/01/22 0830 04/09/2022 0954 04/03/22 0323  WBC 7.5 6.5 6.1  HGB 6.5* 8.0* 8.1*  HCT 23.4* 27.2* 27.8*  PLT 286 236 247   BMET Recent Labs    04/01/22 0411 04/22/2022 0527 04/03/22 0323  NA 141 143 140  K 4.6 4.7 4.3  CL 98 101 98  CO2 '28 31 28  '$ GLUCOSE  119* 114* 118*  BUN 68* 72* 72*  CREATININE 3.73* 3.90* 4.12*  CALCIUM 8.5* 8.2* 7.9*   LFT No results for input(s): "PROT", "ALBUMIN", "AST", "ALT", "ALKPHOS", "BILITOT", "BILIDIR", "IBILI" in the last 72 hours. PT/INR No results for input(s): "LABPROT", "INR" in the last 72 hours. Hepatitis Panel No results for input(s): "HEPBSAG", "HCVAB", "HEPAIGM", "HEPBIGM" in the last 72 hours.   Studies/Results DG CHEST PORT 1 VIEW  Result Date: 03/24/2022 CLINICAL DATA:  Dyspnea, altered mental status. EXAM: PORTABLE CHEST 1 VIEW COMPARISON:  04/03/2022 FINDINGS: Interval complete opacification of the left hemithorax. Previously aerated left upper lobe is either collapsed or consolidated. Large left pleural effusion. Suspected underlying enlargement of the cardiopericardial silhouette Interstitial accentuation in the right lung probably from interstitial edema. Mild blunting of the right lateral costophrenic angle compatible with a small right pleural effusion. A stent projects over the right cardiac shadow. Thoracic spondylosis. Prior median sternotomy. Atherosclerotic calcification of the aortic arch. IMPRESSION: 1. Interval completion of opacification of the left hemithorax with a large left pleural effusion. Previously aerated left upper lobe is either collapsed or consolidated. 2. Interstitial accentuation in the right lung favors interstitial edema. 3. Suspected enlargement of the cardiopericardial silhouette. 4. Small right pleural effusion. 5. Prior median sternotomy. 6.  Aortic Atherosclerosis (ICD10-I70.0). Electronically Signed   By: Van Clines M.D.   On: 04/10/2022 07:41   DG CHEST PORT 1 VIEW  Result Date: 03/29/2022 CLINICAL DATA:  Diastolic heart failure. Abdominal pain. Altered mental status EXAM:  PORTABLE CHEST 1 VIEW COMPARISON:  Two-view chest x-ray 11/05/2021 and 01/10/2022. Numerous other exams as well. FINDINGS: Enlarged cardiopericardial silhouette with sternal wires.  Increasing vascular congestion and interstitial changes. Moderate left effusion. No pneumothorax. Question tiny right effusion. Overlapping cardiac leads. IMPRESSION: Increasing vascular congestion and edema. Electronically Signed   By: Jill Side M.D.   On: 03/26/2022 17:40   CT Head Wo Contrast  Result Date: 04/22/2022 CLINICAL DATA:  Delirium.  Altered mental status. EXAM: CT HEAD WITHOUT CONTRAST TECHNIQUE: Contiguous axial images were obtained from the base of the skull through the vertex without intravenous contrast. RADIATION DOSE REDUCTION: This exam was performed according to the departmental dose-optimization program which includes automated exposure control, adjustment of the mA and/or kV according to patient size and/or use of iterative reconstruction technique. COMPARISON:  December 25, 2014. FINDINGS: Brain: No evidence of acute large vascular territory infarction, hemorrhage, hydrocephalus, extra-axial collection or mass lesion/mass effect. Patchy white matter hypodensities are nonspecific but compatible with chronic microvascular ischemic disease. Vascular: No hyperdense vessel.  Calcific atherosclerosis. Skull: No acute fracture. Sinuses/Orbits: Mild paranasal sinus mucosal thickening. Remote left medial orbital wall fracture. Other: No mastoid effusions. IMPRESSION: No evidence of acute intracranial abnormality. Electronically Signed   By: Margaretha Sheffield M.D.   On: 04/12/2022 14:48   CT ABDOMEN PELVIS WO CONTRAST  Result Date: 03/26/2022 CLINICAL DATA:  Abdominal pain. Altered mental status. Recent diagnosis of pneumonia. EXAM: CT ABDOMEN AND PELVIS WITHOUT CONTRAST TECHNIQUE: Multidetector CT imaging of the abdomen and pelvis was performed following the standard protocol without IV contrast. RADIATION DOSE REDUCTION: This exam was performed according to the departmental dose-optimization program which includes automated exposure control, adjustment of the mA and/or kV according to patient  size and/or use of iterative reconstruction technique. COMPARISON:  07/22/2008. FINDINGS: Lower chest: Heart mildly enlarged. Dense three-vessel coronary artery calcifications. Previous CABG surgery. No pericardial effusion. Small left and trace right pleural effusions. Calcified pleural plaque posteriorly at the right lung base. Left lung base, predominantly lower lobe, atelectasis. Bilateral interstitial thickening with intervening hazy airspace opacities suspected to be pulmonary edema. Hepatobiliary: Normal liver. Dependent gallstone. No findings of acute cholecystitis. No bile duct dilation. Pancreas: Unremarkable. No pancreatic ductal dilatation or surrounding inflammatory changes. Spleen: Normal in size without focal abnormality. Adrenals/Urinary Tract: 1.1 cm low-attenuation left adrenal mass, average Hounsfield units of 9, consistent with an adenoma, with without significant change. No follow-up recommended. Normal right adrenal gland. Bilateral renal cortical thinning. A few small low-attenuation renal masses consistent with cysts. No follow-up recommended. Nonobstructing stone in the midpole the right kidney. No hydronephrosis. Ureters normal in course and in caliber. Bladder is unremarkable. Stomach/Bowel: Stomach is within normal limits. Appendix appears normal. No evidence of bowel wall thickening, distention, or inflammatory changes. Vascular/Lymphatic: Dense aortic atherosclerotic calcifications. No aneurysm. No enlarged lymph nodes. Reproductive: Unremarkable. Other: Small amount of ascites.  Diffuse subcutaneous edema. Musculoskeletal: No fracture or acute finding.  No bone lesion. IMPRESSION: 1. Lung base findings consistent with congestive heart failure with pulmonary edema, small left and trace right pleural effusions. Additional left lung base opacity is consistent with atelectasis. A component of pneumonia is not excluded, however. 2. Small amount of ascites and diffuse subcutaneous edema  consistent with anasarca. 3. No convincing acute abnormality within the abdomen or pelvis. 4. Cholelithiasis. Small nonobstructing stone the right kidney. Dense aortic atherosclerotic calcifications. Electronically Signed   By: Lajean Manes M.D.   On: 03/28/2022 13:21    Assessment  82 y.o.  male with a history of IDA due to chronic GI blood loss in setting of gastric and small bowel AVMs, COPD, diabetes, HTN, TIA, A-fib not anticoagulated due to recurrent GI bleeding, CAD who presented 1/8 with hypothermia and hypoxia on room air and altered mental status. Admitted with acute on chronic heart failure, acute metabolic encephalopathy, blood cultures negative. GI consulted for hemoglobin 7.1, down from 9 in October 21, 2021, last hospitalized.  Anemia: Hemoglobin 7.1 on admission, down to 6.5 on 04/01/2022.  Ferritin low at 20, iron 133, saturation 30%.  Anemia likely multifactorial in the setting of acute illness and known AVMs.  No hematochezia or melena per nursing.  Started on octreotide 100 mcg twice daily on 03/31/2022.  Previous recommendation for outpatient EGD with push enteroscopy for duodenal polyp removal as well as possible colonoscopy however given ongoing decline in hemoglobin he was recommended undergo EGD with enteroscopy while inpatient.  He has had slightly tenuous respiratory status this admission with elevated BNP.  He underwent EGD with enteroscopy yesterday 04/03/2022  with abnormal gastric mucosa of uncertain significance, proximal duodenal AVM s/p APC therapy, pedunculated duodenal polyp resected with hot snare, distal duodenum to mid jejunum appeared normal.  Plan was to have clip placement polypectomy site however given patient's instability with hypotension and hypoxemia during procedure this was not performed as reinspection of polypectomy site area revealed no bleeding. Hgb today stable at 8.1.  Patient has mild generalized abdominal tenderness today on exam and states he was able to  tolerate breakfast.  Will need to continue to monitor H/H and should continue octreotide 100 mcg twice daily.  May advance diet as tolerated.  Close monitoring for any melena or hematochezia needed.  Plan / Recommendations  Advance diet as tolerated Monitor H/H, transfuse hgb <7 Continue Octreotide 100 mcg BID Continue to monitor for overt GI bleeding. Continue daily PPI    LOS: 4 days    04/03/2022, 9:10 AM   Venetia Night, MSN, FNP-BC, AGACNP-BC [facility]

## 2022-04-03 NOTE — TOC Progression Note (Signed)
Transition of Care [facility]) - Progression Note    Patient Details  Name: Donald Gaines MRN: [id number] Date of Birth: [birth year]  [facility]) CM/SW [place], [place] Phone Number: 04/03/2022, 10:32 AM  Clinical Narrative:    CSW updated [facility] that pt will be here through the weekend. PT/OT eval is pending at this time. TOC to follow.   Expected Discharge Plan: Assisted Living Barriers to Discharge: Continued Medical Work up  Expected Discharge Plan and Services      Social Determinants of Health (SDOH) Interventions SDOH Screenings   Food Insecurity: No Food Insecurity (04/03/2022)  Housing: Low Risk  (04/03/2022)  Transportation Needs: No Transportation Needs (04/03/2022)  Utilities: Not At Risk (04/03/2022)  Alcohol Screen: Low Risk  (01/26/2022)  Depression (PHQ2-9): Low Risk  (01/26/2022)  Financial Resource Strain: Low Risk  (01/26/2022)  Physical Activity: Inactive (01/26/2022)  Social Connections: Moderately Integrated (01/26/2022)  Stress: No Stress Concern Present (01/26/2022)  Tobacco Use: High Risk (03/28/2022)    Readmission Risk Interventions     No data to display

## 2022-04-03 NOTE — Evaluation (Signed)
Physical Therapy Evaluation Patient Details Name: Donald Gaines MRN: [id number] DOB: [birth year] Today's Date: 04/03/2022  History of Present Illness  Donald Gaines is a 82 y.o. male with medical history significant for diabetes mellitus, atrial fibrillation, COPD, CHF, coronary artery disease, restrictive lung disease.  Patient was brought to the ED with complaints of altered mental status and abdominal pain.  Per charge nurse, he was diagnosed with pneumonia last week and has been on antibiotics.   At the time of my evaluation, patient is somnolent, arouses, able to tell me his name but not answering questions.   Clinical Impression  Patient limited for functional mobility as stated below secondary to BLE weakness, fatigue and impaired balance. Patient with slow, labored transition to seated EOB requiring assist and cueing to complete. He demonstrates good/fair sitting balance and good sitting tolerance at bedside. Attempted to transfer patient to standing with max assist but he was apprehensive to complete. Patient assisted back to supine at end of session. Patient will benefit from continued physical therapy in hospital and recommended venue below to increase strength, balance, endurance for safe ADLs and gait.        Recommendations for follow up therapy are one component of a multi-disciplinary discharge planning process, led by the attending physician.  Recommendations may be updated based on patient status, additional functional criteria and insurance authorization.  Follow Up Recommendations Skilled nursing-short term rehab (<3 hours/day) Can patient physically be transported by private vehicle: No    Assistance Recommended at Discharge Frequent or constant Supervision/Assistance  Patient can return home with the following  A lot of help with walking and/or transfers;A lot of help with bathing/dressing/bathroom    Equipment Recommendations None recommended by PT   Recommendations for Other Services       Functional Status Assessment Patient has had a recent decline in their functional status and demonstrates the ability to make significant improvements in function in a reasonable and predictable amount of time.     Precautions / Restrictions Precautions Precautions: Fall Restrictions Weight Bearing Restrictions: No      Mobility  Bed Mobility Overal bed mobility: Needs Assistance Bed Mobility: Supine to Sit, Sit to Supine     Supine to sit: Min assist, Mod assist, HOB elevated Sit to supine: Min assist   General bed mobility comments: Labored, requires increased time, frequent cueing, and assist to pull to seated and scoot EOB    Transfers                        Ambulation/Gait                  Stairs            Wheelchair Mobility    Modified Rankin (Stroke Patients Only)       Balance Overall balance assessment: Needs assistance Sitting-balance support: No upper extremity supported, Feet supported Sitting balance-Leahy Scale: Good Sitting balance - Comments: good/fair seated EOB                                     Pertinent Vitals/Pain Pain Assessment Pain Assessment: No/denies pain    Home Living Family/patient expects to be discharged to:: Skilled nursing facility                        Prior Function  Mobility Comments: Patient stating he was ambulating with RW until last month and lately has been using WC ADLs Comments: assisted by staff     Hand Dominance        Extremity/Trunk Assessment   Upper Extremity Assessment Upper Extremity Assessment: Generalized weakness    Lower Extremity Assessment Lower Extremity Assessment: Generalized weakness    Cervical / Trunk Assessment Cervical / Trunk Assessment: Kyphotic  Communication   Communication: No difficulties  Cognition Arousal/Alertness: Awake/alert Behavior During Therapy:  WFL for tasks assessed/performed Overall Cognitive Status: No family/caregiver present to determine baseline cognitive functioning                                          General Comments      Exercises     Assessment/Plan    PT Assessment Patient needs continued PT services  PT Problem List Decreased strength;Decreased mobility;Decreased activity tolerance;Decreased balance       PT Treatment Interventions DME instruction;Therapeutic exercise;Gait training;Balance training;Stair training;Neuromuscular re-education;Functional mobility training;Therapeutic activities;Patient/family education    PT Goals (Current goals can be found in the Care Plan section)  Acute Rehab PT Goals Patient Stated Goal: get better PT Goal Formulation: With patient Time For Goal Achievement: 04/03/22 Potential to Achieve Goals: Fair    Frequency Min 3X/week     Co-evaluation               AM-PAC PT "6 Clicks" Mobility  Outcome Measure Help needed turning from your back to your side while in a flat bed without using bedrails?: A Little Help needed moving from lying on your back to sitting on the side of a flat bed without using bedrails?: A Little Help needed moving to and from a bed to a chair (including a wheelchair)?: A Lot Help needed standing up from a chair using your arms (e.g., wheelchair or bedside chair)?: A Lot Help needed to walk in hospital room?: A Lot Help needed climbing 3-5 steps with a railing? : A Lot 6 Click Score: 14    End of Session Equipment Utilized During Treatment: Oxygen Activity Tolerance: Patient tolerated treatment well;Patient limited by fatigue Patient left: in bed;with call bell/phone within reach;with bed alarm set Nurse Communication: Mobility status PT Visit Diagnosis: Unsteadiness on feet (R26.81);Other abnormalities of gait and mobility (R26.89);Muscle weakness (generalized) (M62.81);History of falling (Z91.81)    Time:  9794-8016 PT Time Calculation (min) (ACUTE ONLY): 26 min   Charges:   PT Evaluation $PT Eval Low Complexity: 1 Low PT Treatments $Therapeutic Activity: 8-22 mins        12:11 PM, 04/03/22 Mearl Latin PT, DPT Physical Therapist at [facility]

## 2022-04-03 NOTE — Progress Notes (Signed)
This Probation officer went into patients room at 1703, patient nonresponsive and his respirations were rigid and uneven, patient had pulled off his oxygen and had been previously in shift. Slid patient up in bed and placed on 15 L nonrebreather mask and he remained unresponsive to verbal commands, eyes open but no verbal response noted.  Telemetry had not called and said he was off of telemetry , myself and RN Verita Lamb were at bedside and patients eyes rolled in the back of his head and we immediately called a code blue when we noted there was no pulse. CPR initiated at 1712, 1714 patient given EPI by ACLS nurse, pulse checked by physician.  patient was then shocked with AED pads at 5:15pm. , intubated by Physician at 5:16 Epi was  also administered and then  pulse was checked and he was shocked again at 1717 at 200  jueles. Pulse noted at 1718 , Dr. Sabra Heck performed thoracentesis at bedside at 1725 post paralytic  , 280cc removed and  sample taken to lab. 20 Gauge was placed to right Jugular ,labs drawn . Bolus was started in the 22 in patients right arm . Levaphid given at 1735. Vitals checked at 1738 and patients BP 107/81, Pulse was 153. We then rechecked at 1741 BP 95/81 and Pulse then 155bpm.   Patient then transported via bed to ICU room 12. High quality CPR was maintained and guided by team leader. Dr. Joesph Fillers called patients sister and notified her of situation.

## 2022-04-03 NOTE — ED Provider Notes (Signed)
Department of Emergency Medicine CODE BLUE CONSULTATION    Code Blue CONSULT NOTE  Chief Complaint: Cardiac arrest/unresponsive   Level V Caveat: Unresponsive  History of present illness: I was contacted by the hospital for a CODE BLUE cardiac arrest upstairs and presented to the patient's bedside.  The patient was found to be in cardiac arrest and what appeared to be ventricular fibrillation actively getting CPR by nursing staff.  Evidently the patient had been taking his oxygen off throughout the day and is confused state and ultimately likely had a hypoxic cardiac arrest.  ROS: Unable to obtain, Level V caveat  Scheduled Meds:  [START ON 05-02-22] albuterol  2.5 mg Inhalation TID   Chlorhexidine Gluconate Cloth  6 each Topical Q0600   docusate  100 mg Per Tube BID   fluticasone furoate-vilanterol  1 puff Inhalation Daily   metoprolol tartrate  2.5 mg Intravenous Q8H   octreotide  100 mcg Subcutaneous BID   [START ON 05-02-2022] pantoprazole (PROTONIX) IV  40 mg Intravenous Q12H   polyethylene glycol  17 g Per Tube Daily   umeclidinium bromide  1 puff Inhalation Daily   Continuous Infusions:  sodium chloride 10 mL/hr at 04/03/22 2138   furosemide (LASIX) 200 mg in dextrose 5 % 100 mL (2 mg/mL) infusion 4 mg/hr (04/03/22 2138)   norepinephrine (LEVOPHED) Adult infusion 7 mcg/min (04/03/22 2138)   propofol (DIPRIVAN) infusion 35 mcg/kg/min (04/03/22 2138)   PRN Meds:.acetaminophen **OR** acetaminophen, albuterol, fentaNYL (SUBLIMAZE) injection, fentaNYL (SUBLIMAZE) injection Past Medical History:  Diagnosis Date   Arteriovenous malformation small bowel    Capsule study 3/10   Bilateral carotid artery stenosis    BPH (benign prostatic hypertrophy)    Candida esophagitis (HCC)    Cataract    Chronic anemia    Chronic diarrhea    COPD (chronic obstructive pulmonary disease) (HCC)    Coronary atherosclerosis of native coronary artery    Multivessel, BMS SVG TO RCA  2000, reportedly  "2" additional stents in interim   Dementia ([place])    Depression    Diabetes mellitus, type 2 ([place])    Essential hypertension    GERD (gastroesophageal reflux disease)    GI bleeding    Cecal ulcers, arteriovenous malformations   Glaucoma    Hemorrhoids    Hyperlipidemia    Myocardial infarction ([place])    1994   Portal hypertensive gastropathy ([place]) 11/08/2012   EGD. Dr. Laural Golden   Restrictive lung disease    Sleep apnea    TIA (transient ischemic attack)    2005   Past Surgical History:  Procedure Laterality Date   CAPSULE ENDO  03/10   cardiac catherization  04/2012   CARDIAC CATHETERIZATION  04/05/1996 [name]   EF 45%, occluded SVG to circumflex, patent SVG to D1 and PDA and patent LIMA to LAD with severe native vessel disease.   CARDIAC CATHETERIZATION  08/22/1998 [name]   Mild LM, severe LAD,severe CX, occlude RCA, patient  SVG to diatal RCA, patent vein graft to D1 and patent LIMA to LAD.    CATARACT EXTRACTION W/PHACO Right 09/22/2012   Procedure: CATARACT EXTRACTION PHACO AND INTRAOCULAR LENS PLACEMENT (IOC);  Surgeon: Tonny Branch, MD;  Location: [facility];  Service: Ophthalmology;  Laterality: Right;  CDE: 11.43   CATARACT EXTRACTION W/PHACO Left 10/17/2012   Procedure: CATARACT EXTRACTION PHACO AND INTRAOCULAR LENS PLACEMENT (IOC);  Surgeon: Tonny Branch, MD;  Location: [facility];  Service: Ophthalmology;  Laterality: Left;  CDE: 8.06  COLONOSCOPY  APR 2008   SIMPLE ADENOMA, [place] TICS, IH   COLONOSCOPY  FEB 2008 ANEMIA. [place]LE[place]    2 LARGE ILEOCEAL ULCERS 2o to ASA, [place] TICS, IH   COLONOSCOPY  SEP 2009 TRANSFUSION DEP ANEMIA   AC AVM-ABLATED,  TICS, IH   COLONOSCOPY WITH ESOPHAGOGASTRODUODENOSCOPY (EGD) N/A 11/08/2012   Procedure: COLONOSCOPY WITH ESOPHAGOGASTRODUODENOSCOPY (EGD);  Surgeon: Rogene Houston, MD;  [facility];  Service: Endoscopy;  Laterality: N/A;  250-rescheduled to 7:30 Ann to notify pt   CORONARY ANGIOPLASTY WITH STENT  PLACEMENT  08/22/1998 [name]   TEC stenting of the SVG to RCA-Last seen by cardiologist in 2010.   CORONARY ARTERY BYPASS GRAFT  1995-triple bypass   LIMA-LAD, LIMA to intermediate branch, SVG to PD and second OM,   ENTEROSCOPY N/A 11/07/2021   Procedure: ENTEROSCOPY;  Surgeon: Harvel Quale, MD;  [facility];  Service: Gastroenterology;  Laterality: N/A;  PUSH ENTEROSCOPY   ESOPHAGOGASTRODUODENOSCOPY  SEP 09   DUODENAL LIPOMA   ESOPHAGOGASTRODUODENOSCOPY N/A 09/27/2013   Procedure: ESOPHAGOGASTRODUODENOSCOPY (EGD) Push enteroscopy;  Surgeon: Rogene Houston, MD;  [facility];  Service: Endoscopy;  Laterality: N/A;   ESOPHAGOGASTRODUODENOSCOPY (EGD) WITH PROPOFOL N/A 11/06/2021   Procedure: ESOPHAGOGASTRODUODENOSCOPY (EGD) WITH PROPOFOL;  Surgeon: Eloise Harman, DO;  [facility];  Service: Endoscopy;  Laterality: N/A;   GIVENS CAPSULE STUDY N/A 11/24/2012   Procedure: GIVENS CAPSULE STUDY;  Surgeon: Rogene Houston, MD;  [facility];  Service: Endoscopy;  Laterality: N/A;  [place] 11/06/2021   Procedure: GIVENS CAPSULE STUDY;  Surgeon: Eloise Harman, DO;  [facility];  Service: Endoscopy;  Laterality: N/A;   HEMOSTASIS CLIP PLACEMENT  11/07/2021   Procedure: HEMOSTASIS CLIP PLACEMENT;  Surgeon: Harvel Quale, MD;  [facility];  Service: Gastroenterology;;   HOT HEMOSTASIS  09/27/2013   Procedure: HOT HEMOSTASIS (ARGON PLASMA COAGULATION/BICAP);  Surgeon: Rogene Houston, MD;  [facility];  Service: Endoscopy;;   HOT HEMOSTASIS  11/07/2021   Procedure: HOT HEMOSTASIS (ARGON PLASMA COAGULATION/BICAP);  Surgeon: Montez Morita, Quillian Quince, MD;  [facility];  Service: Gastroenterology;;   HYDROGEN BREATH TEST  2009   RIGHT INGUINAL HERNIA REPAIR     UPPER GASTROINTESTINAL ENDOSCOPY  SEP 2009   GASTRIC AVM ABLATED, NL DUO Bx   Social History    Socioeconomic History   Marital status: Divorced    Spouse name: Not on file   Number of children: Not on file   Years of education: 12   Highest education level: 12th grade  Occupational History   Not on file  Tobacco Use   Smoking status: Every Day    Packs/day: 1.00    Years: 60.00    Total pack years: 60.00    Types: Cigarettes    Start date: 01/02/1949    Passive exposure: Current   Smokeless tobacco: Never   Tobacco comments:    1 pack a day    Smoking Cessation Offered  Vaping Use   Vaping Use: Never used  Substance and Sexual Activity   Alcohol use: No    Alcohol/week: 0.0 standard drinks of alcohol    Comment: No etoh in 12 yrs.   Drug use: Yes    Types: Marijuana    Comment: last use 10/25/19   Sexual activity: Yes    Partners: Female    Birth control/protection: None  Other  Topics Concern   Not on file  Social History Narrative   Not on file   Social Determinants of Health   Financial Resource Strain: Low Risk  (01/26/2022)   Overall Financial Resource Strain (CARDIA)    Difficulty of Paying Living Expenses: Not hard at all  Food Insecurity: No Food Insecurity (04/03/2022)   Hunger Vital Sign    Worried About Running Out of Food in the Last Year: Never true    Ran Out of Food in the Last Year: Never true  Transportation Needs: No Transportation Needs (04/03/2022)   PRAPARE - Hydrologist (Medical): No    Lack of Transportation (Non-Medical): No  Physical Activity: Inactive (01/26/2022)   Exercise Vital Sign    Days of Exercise per Week: 0 days    Minutes of Exercise per Session: 0 min  Stress: No Stress Concern Present (01/26/2022)   [place]    Feeling of Stress : Only a little  Social Connections: Moderately Integrated (01/26/2022)   Social Connection and Isolation Panel [NHANES]    Frequency of Communication with Friends and Family: More than three times  a week    Frequency of Social Gatherings with Friends and Family: More than three times a week    Attends Religious Services: More than 4 times per year    Active Member of [employer] or Organizations: Yes    Attends Music therapist: More than 4 times per year    Marital Status: Divorced  Intimate Partner Violence: Not At Risk (04/03/2022)   Humiliation, Afraid, Rape, and Kick questionnaire    Fear of Current or Ex-Partner: No    Emotionally Abused: No    Physically Abused: No    Sexually Abused: No   Allergies  Allergen Reactions   Penicillins Rash    Has patient had a PCN reaction causing immediate rash, facial/tongue/throat swelling, SOB or lightheadedness with hypotension: Yes Has patient had a PCN reaction causing severe rash involving mucus membranes or skin necrosis: No Has patient had a PCN reaction that required hospitalization No Has patient had a PCN reaction occurring within the last 10 years: No If all of the above answers are "NO", then may proceed with Cephalosporin use.     Last set of Vital Signs (not current) Vitals:   04/03/22 2145 04/03/22 2200  BP: (!) 101/45 (!) 105/55  Pulse: (!) 115 (!) 104  Resp: 20 20  Temp:    SpO2: 100% 100%      Physical Exam  Gen: unresponsive Cardiovascular: pulseless, pulses palpable with CPR Resp: apneic. Breath sounds equal bilaterally with bagging  Abd: nondistended  Neuro: GCS 3, unresponsive to pain  HEENT: No blood in posterior pharynx, gag reflex absent  Neck: No crepitus  Musculoskeletal: No deformity, edema of all 4 extremities Skin: warm  Procedures  INTUBATION Performed by: Johnna Acosta Required items: required blood products, implants, devices, and special equipment available Patient identity confirmed: provided demographic data and hospital-assigned identification number Time out: Immediately prior to procedure a "time out" was called to verify the correct patient, procedure, equipment, support  staff and site/side marked as required. Indications: Apnea Intubation method: Direct visualization, direct laryngoscopy Preoxygenation: BVM Sedatives: None Paralytic: None Tube Size: 7.5 cuffed Post-procedure assessment: chest rise and ETCO2 monitor Breath sounds: equal and absent over the epigastrium Tube secured by Respiratory Therapy Patient tolerated the procedure well with no immediate complications.  CRITICAL CARE Performed  by: Johnna Acosta Total critical care time: 30 Critical care time was exclusive of separately billable procedures and treating other patients. Critical care was necessary to treat or prevent imminent or life-threatening deterioration. Critical care was time spent personally by me on the following activities: development of treatment plan with patient and/or surrogate as well as nursing, discussions with consultants, evaluation of patient's response to treatment, examination of patient, obtaining history from patient or surrogate, ordering and performing treatments and interventions, ordering and review of laboratory studies, ordering and review of radiographic studies, pulse oximetry and re-evaluation of patient's condition.  Cardiopulmonary Resuscitation (CPR) Procedure Note I personally directed CPR for approximately 10 minutes Directed/Performed by: Johnna Acosta I personally directed ancillary staff and/or performed CPR in an effort to regain return of spontaneous circulation and to maintain cardiac, neuro and systemic perfusion.   Defibrillation: The patient was found to be in ventricular fibrillation, I personally performed defibrillation at 200 J, biphasic, this was performed twice, each time the patient converted to normal sinus rhythm, after the first time he went back into ventricular fibrillation, then converted again with a second defibrillation and stayed in sinus rhythm  Thoracentesis: Patient was placed in the semirecumbent position and the left  side of the chest was prepped with ChloraPrep, an 18-gauge needle was inserted in the intercostal space, the intrathoracic cavity was reached as fluid was withdrawn, approximately 300 cc with with withdrawn and the sample was sent to the lab.    Medical Decision making  This patient had what appeared to be a cardiac arrest, the cause is not exact however the patient was able to be resuscitated with a pulse and only 2 doses of epinephrine which I personally gave.  I also placed an Angiocath in the patient's right external jugular vein as he had poor IV access and needed some vasopressors.  I was asked by the hospitalist to draw off fluid from the patient's pleural effusion.  After examining the x-ray with the hospitalist it was found that the patient had a large pleural effusion.  After prepping the skin I was able to remove approximately 300 cc of clear fluid.    Assessment and Plan  Unfortunately this patient has a very poor prognosis, the hospitalist team with Dr. Denton Brick will discuss with family regarding neck steps.    Noemi Chapel, MD 04/03/22 2330

## 2022-04-03 NOTE — Plan of Care (Signed)
  Problem: Acute Rehab PT Goals(only PT should resolve) Goal: Pt Will Go Supine/Side To Sit Outcome: Progressing Flowsheets (Taken 04/03/2022 1212) Pt will go Supine/Side to Sit:  with min guard assist  with minimal assist Goal: Pt Will Go Sit To Supine/Side Outcome: Progressing Flowsheets (Taken 04/03/2022 1212) Pt will go Sit to Supine/Side:  with min guard assist  with minimal assist Goal: Patient Will Transfer Sit To/From Stand Outcome: Progressing Flowsheets (Taken 04/03/2022 1212) Patient will transfer sit to/from stand:  with minimal assist  with moderate assist Goal: Pt Will Transfer Bed To Chair/Chair To Bed Outcome: Progressing Flowsheets (Taken 04/03/2022 1212) Pt will Transfer Bed to Chair/Chair to Bed:  with min assist  with mod assist Goal: Pt/caregiver will Perform Home Exercise Program Outcome: Progressing Flowsheets (Taken 04/03/2022 1212) Pt/caregiver will Perform Home Exercise Program:  For increased strengthening  For improved balance  With Supervision, verbal cues required/provided  12:13 PM, 04/03/22 Mearl Latin PT, DPT Physical Therapist at [facility]

## 2022-04-03 NOTE — Plan of Care (Signed)
  Problem: Education: Goal: Ability to verbalize understanding of medication therapies will improve Outcome: Progressing   Problem: Education: Goal: Individualized Educational Video(s) Outcome: Progressing

## 2022-04-03 NOTE — Progress Notes (Signed)
Progress Note   Patient: Donald Gaines NLG:[id number] DOB: 11/10/1940 DOA: 03/31/2022     4 DOS: the patient was seen and examined on 04/03/2022   Brief hospital course: As per H&P written by Dr. Denton Brick on 04/03/2022 Donald Gaines is a 82 y.o. male with medical history significant for diabetes mellitus, atrial fibrillation, COPD, CHF, coronary artery disease, restrictive lung disease. Patient was brought to the ED with complaints of altered mental status and abdominal pain.  Per charge nurse, he was diagnosed with pneumonia last week and has been on antibiotics.  At the time of my evaluation, patient is somnolent, arouses, able to tell me his name but not answering questions.   ED Course: Hypothermic temperature 94.6.  Heart rate 76-94, respiratory rate 10-18.  Blood pressure systolic 1 08-144.  O2 sats 93 to 100% on 2 L. BNP elevated at 901. CT abdomen and pelvis lung base consistent with CHF, pulmonary edema, left lung base atelectasis, pneumonia not excluded, small amount of ascites and diffuse subcutaneous edema consistent with anasarca. Head CT negative for acute abnormality. 1 L bolus initially given, Protonix, morphine 4 mg, then 40 mg Lasix given. Hospitalist to admit for decompensated CHF and encephalopathy.  Assessment and Plan: 1)HFpEF/Acute on chronic diastolic (congestive) heart failure /with bilateral left pleural effusion left more than right -Presenting with abdominal distention, CT showing small ascites, diffuse subcutaneous edema, + trace bilateral lower extremity edema.  BNP elevated at 901.   -Currently on 3 L  -Echo shows-EF of 60 to 65%,  shows significant hypertrophy with basal lateral  hypokinesis and basal inferior hypokinesis.  Severe LVH, consider other imaging modality (MRI) to evaluate for possible amyloid, when clinically improved.  Severe left atrial enlargement and moderate left atrial enlargement, no aortic stenosis -Change IV Lasix from bolus to  continuous drip due to BP concerns -Status post thoracic transfusion on 04/03/2022 with removal of 280 mL of yellow fluid -Follow strict I's and O's and daily weights.    2)Acute metabolic encephalopathy - Head CT negative for acute abnormality.  -Hypothermia has resolved  -No evidence of acute infection -??  If uremia related with worsening renal function -Patient reported recently finishing a week course of oral antibiotic for outpatient pneumonia.  -Continue supportive care and hold on further antibiotics. -continue Holding Xanax, gabapentin  3)Acute on chronic iron deficiency anemia due to chronic blood loss -Hgb is up to 8.0 from 6.5 after transfusion of 1 unit of PRBC on 04/01/2022 -No overt bleeding appreciated. -History of chronic GI blood loss-gastric and small bowel AVM's.  Push enteroscopy 11/07/2021 - several nonbleeding angioplastic in duodenum, treated with APC, clip.  Normal endoscopy 11/06/2021.   EGD on 03/27/2022 shows  Abnormal gastric mucosa of uncertain significance, Proximal duodenal AVM active bleeding?"sealed with APC. Pedunculated polyp proximal duodenum resected  -HGB stable around 8 -Continue to monitor H&H -Continue IV Protonix every 12 hours  4)Chronic kidney disease (CKD), stage IV (severe) (HCC) -Creatinine trending up currently 3.73, baseline usually just under 3 -Monitor  renal function closely with diuresis - renally adjust medications, avoid nephrotoxic agents / dehydration  / hypotension  5)Permanent atrial fibrillation -post endoscopy patient went into RVR -Continue metoprolol for rate control -Severe left atrial and moderate right atrial enlargement noted -EF is preserved as above -Continue metoprolol as BP allows for rate control -No anticoagulation secondary to GIB.  6)COPD (chronic obstructive pulmonary disease) (HCC) -Stable overall. -Intubated and sedated on 04/03/2022  7)Essential hypertension -PTA was on metoprolol and  Lasix -Currently on  IV Levophed postintubation  8)Type 2 diabetes mellitus (HCC) -A1c 5.4.   -d/c metformin at discharge Use Novolog/Humalog Sliding scale insulin with Accu-Cheks/Fingersticks as ordered   9)Generalized weakness and deconditioning--- physical therapy initially recommended SNF rehab  10)Social/Ethics- Intubated and sedated on 04/03/2022 Discussed with Patient's sister Donald Gaines and patient's niece Donald Gaines face-to-face in ICU , also updated patient's  Sister Donald Gaines by phone -Patient currently intubated and sedated -Family request no further escalation of care and DNR status -They will consider compassionate extubation over the next day or 2 if patient fails to improve  -Procedures 1) intubated on 04/03/2021 2) left-sided thoracentesis at bedside by Dr. Sabra Heck with 280 mL of yellow pleural fluid removed 3) EGD on 04/21/2022  CRITICAL CARE Performed by: Roxan Hockey   Total critical care time: 78 minutes  Critical care time was exclusive of separately billable procedures and treating other patients. - Currently intubated, sedated and on Levophed for pressure support Critical care was necessary to treat or prevent imminent or life-threatening deterioration.  Critical care was time spent personally by me on the following activities: development of treatment plan with patient and/or surrogate as well as nursing, discussions with consultants, evaluation of patient's response to treatment, examination of patient, obtaining history from patient or surrogate, ordering and performing treatments and interventions, ordering and review of laboratory studies, ordering and review of radiographic studies, pulse oximetry and re-evaluation of patient's condition.   Subjective:  -Patient has been noted to be taking off his oxygen from time to time and desaturating... Despite redirections and encouragement to keep O2 on -*1 patient unresponsive with his oxygen off again--patient lost his pulse CODE BLUE  was called =-Please see separate documentation for CODE BLUE -Intubated by Dr. Sabra Heck from the ED- -resuscitative efforts were successful and patient was transferred to ICU on the vent with IV Levophed running through peripheral line  Physical Exam: Vitals:   04/03/22 1300 04/03/22 1400 04/03/22 1510 04/03/22 1752  BP: 112/77 101/69  122/88  Pulse: (!) 106 82  (!) 141  Resp:    20  Temp:      TempSrc:      SpO2:   95% 94%  Weight:      Height:       General exam: intubated and sedated HEENT -ET tube Lungs--diminished on the left, scattered wheezes Heart-S1-S2, irregularly irregular and tachycardic Gastrointestinal system: Abdomen is nondistended, soft and  +BS NeuroPsych--intubated and sedated   Extremities:-Good pedal pulses, pitting edema noted GU-Foley to be removed on 04/01/2022, re-inserted on  04/03/2022 after intubation  Family Communication: -Patient's sister Miles Leyda, patient's older Sister Donald Gaines and patient's niece Donald Gaines also updated  Disposition: Currently intubated and sedated  status is: Inpatient Remains inpatient appropriate because: Continue IV diuresis for CHF exacerbation.  Author: Roxan Hockey, MD 04/03/2022 7:14 PM  For on call review [email or web address].

## 2022-04-03 NOTE — Care Management Important Message (Signed)
Important Message  Patient Details  Name: Donald Gaines MRN: [id number] Date of Birth: 11/06/1940   Medicare Important Message Given:  Yes (reviewed letter with sister [name] at [phone])     [name] 04/03/2022, 11:44 AM

## 2022-04-04 DIAGNOSIS — K5521 Angiodysplasia of colon with hemorrhage: Secondary | ICD-10-CM

## 2022-04-04 DIAGNOSIS — I5033 Acute on chronic diastolic (congestive) heart failure: Secondary | ICD-10-CM | POA: Diagnosis not present

## 2022-04-04 DIAGNOSIS — D62 Acute posthemorrhagic anemia: Secondary | ICD-10-CM

## 2022-04-04 LAB — BLOOD GAS, ARTERIAL
Acid-Base Excess: 2.5 mmol/L — ABNORMAL HIGH (ref 0.0–2.0)
Bicarbonate: 27.9 mmol/L (ref 20.0–28.0)
Drawn by: 22223
FIO2: 100 %
O2 Saturation: 99.1 %
Patient temperature: 37
pCO2 arterial: 45 mmHg (ref 32–48)
pH, Arterial: 7.4 (ref 7.35–7.45)
pO2, Arterial: 146 mmHg — ABNORMAL HIGH (ref 83–108)

## 2022-04-04 LAB — BASIC METABOLIC PANEL
Anion gap: 17 — ABNORMAL HIGH (ref 5–15)
BUN: 68 mg/dL — ABNORMAL HIGH (ref 8–23)
CO2: 23 mmol/L (ref 22–32)
Calcium: 7.8 mg/dL — ABNORMAL LOW (ref 8.9–10.3)
Chloride: 100 mmol/L (ref 98–111)
Creatinine, Ser: 4.15 mg/dL — ABNORMAL HIGH (ref 0.61–1.24)
GFR, Estimated: 14 mL/min — ABNORMAL LOW (ref >60)
Glucose, Bld: 157 mg/dL — ABNORMAL HIGH (ref 70–99)
Potassium: 3.8 mmol/L (ref 3.5–5.1)
Sodium: 140 mmol/L (ref 135–145)

## 2022-04-04 LAB — GLUCOSE, CAPILLARY
Glucose-Capillary: 132 mg/dL — ABNORMAL HIGH (ref 70–99)
Glucose-Capillary: 137 mg/dL — ABNORMAL HIGH (ref 70–99)
Glucose-Capillary: 151 mg/dL — ABNORMAL HIGH (ref 70–99)
Glucose-Capillary: 152 mg/dL — ABNORMAL HIGH (ref 70–99)

## 2022-04-04 LAB — CBC
HCT: 29 % — ABNORMAL LOW (ref 39.0–52.0)
Hemoglobin: 8.7 g/dL — ABNORMAL LOW (ref 13.0–17.0)
MCH: 30.6 pg (ref 26.0–34.0)
MCHC: 30 g/dL (ref 30.0–36.0)
MCV: 102.1 fL — ABNORMAL HIGH (ref 80.0–100.0)
Platelets: 232 10*3/uL (ref 150–400)
RBC: 2.84 MIL/uL — ABNORMAL LOW (ref 4.22–5.81)
RDW: 17.6 % — ABNORMAL HIGH (ref 11.5–15.5)
WBC: 10.4 10*3/uL (ref 4.0–10.5)
nRBC: 0.3 % — ABNORMAL HIGH (ref 0.0–0.2)

## 2022-04-04 LAB — LACTATE DEHYDROGENASE: LDH: 284 U/L — ABNORMAL HIGH (ref 98–192)

## 2022-04-04 LAB — CULTURE, BLOOD (ROUTINE X 2)
Culture: NO GROWTH
Culture: NO GROWTH
Special Requests: ADEQUATE

## 2022-04-04 LAB — TRIGLYCERIDES: Triglycerides: 148 mg/dL (ref <150)

## 2022-04-04 MED ORDER — ALBUTEROL SULFATE (2.5 MG/3ML) 0.083% IN NEBU: 2.5000 mg | INHALATION_SOLUTION | RESPIRATORY_TRACT | Status: DC | PRN

## 2022-04-04 MED ORDER — GLYCOPYRROLATE 0.2 MG/ML IJ SOLN
0.2000 mg | INTRAMUSCULAR | Status: DC | PRN
  Administered 2022-04-04: 0.2 mg via INTRAVENOUS
  Filled 2022-04-04: qty 1

## 2022-04-04 MED ORDER — LORAZEPAM 2 MG/ML PO CONC: 1.0000 mg | ORAL | Status: DC | PRN

## 2022-04-04 MED ORDER — LORAZEPAM 2 MG/ML IJ SOLN: 1.0000 mg | INTRAMUSCULAR | Status: DC | PRN

## 2022-04-04 MED ORDER — GLYCOPYRROLATE 0.2 MG/ML IJ SOLN: 0.2000 mg | INTRAMUSCULAR | Status: DC | PRN

## 2022-04-04 MED ORDER — FENTANYL BOLUS VIA INFUSION: 20.0000 ug | INTRAVENOUS | Status: DC | PRN

## 2022-04-04 MED ORDER — FENTANYL 2500MCG IN NS 250ML (10MCG/ML) PREMIX INFUSION: 0.0000 ug/h | INTRAVENOUS | Status: DC

## 2022-04-04 MED ORDER — GLYCOPYRROLATE 1 MG PO TABS: 1.0000 mg | ORAL_TABLET | ORAL | Status: DC | PRN

## 2022-04-04 MED ORDER — PROPOFOL 1000 MG/100ML IV EMUL
INTRAVENOUS | Status: AC
  Filled 2022-04-04: qty 100

## 2022-04-04 MED ORDER — BISACODYL 10 MG RE SUPP: 10.0000 mg | Freq: Every day | RECTAL | Status: DC | PRN

## 2022-04-04 MED ORDER — LORAZEPAM 1 MG PO TABS: 1.0000 mg | ORAL_TABLET | ORAL | Status: DC | PRN

## 2022-04-04 MED ORDER — MORPHINE 100MG IN NS 100ML (1MG/ML) PREMIX INFUSION: 2.0000 mg/h | INTRAVENOUS | Status: DC

## 2022-04-04 MED ORDER — DIPHENHYDRAMINE HCL 50 MG/ML IJ SOLN: 12.5000 mg | INTRAMUSCULAR | Status: DC | PRN

## 2022-04-04 MED ORDER — BIOTENE DRY MOUTH MT LIQD: 15.0000 mL | OROMUCOSAL | Status: DC | PRN

## 2022-04-04 MED ORDER — MORPHINE BOLUS VIA INFUSION: 2.0000 mg | INTRAVENOUS | Status: DC | PRN

## 2022-04-04 MED ORDER — ORAL CARE MOUTH RINSE
15.0000 mL | OROMUCOSAL | Status: DC
  Administered 2022-04-04 (×6): 15 mL via OROMUCOSAL

## 2022-04-04 MED ORDER — MORPHINE 100MG IN NS 100ML (1MG/ML) PREMIX INFUSION
0.0000 mg/h | INTRAVENOUS | Status: DC
  Administered 2022-04-04: 3 mg/h via INTRAVENOUS
  Filled 2022-04-04: qty 100

## 2022-04-04 MED ORDER — LORAZEPAM 2 MG/ML IJ SOLN
1.0000 mg | INTRAMUSCULAR | Status: DC | PRN
  Administered 2022-04-04 (×2): 1 mg via INTRAVENOUS
  Filled 2022-04-04 (×3): qty 1

## 2022-04-04 MED ORDER — ORAL CARE MOUTH RINSE: 15.0000 mL | OROMUCOSAL | Status: DC | PRN

## 2022-04-05 LAB — TYPE AND SCREEN
ABO/RH(D): O POS
Antibody Screen: NEGATIVE
Donor AG Type: NEGATIVE
Donor AG Type: NEGATIVE
Unit division: 0
Unit division: 0

## 2022-04-05 LAB — BPAM RBC
Blood Product Expiration Date: 202402142359
Blood Product Expiration Date: 202402152359
ISSUE DATE / TIME: 202401110421
Unit Type and Rh: 5100
Unit Type and Rh: 5100

## 2022-04-06 LAB — PATHOLOGIST SMEAR REVIEW

## 2022-04-07 DIAGNOSIS — J449 Chronic obstructive pulmonary disease, unspecified: Secondary | ICD-10-CM | POA: Diagnosis not present

## 2022-04-07 LAB — CULTURE, BODY FLUID W GRAM STAIN -BOTTLE: Special Requests: ADEQUATE

## 2022-04-09 ENCOUNTER — Encounter (HOSPITAL_COMMUNITY): Payer: Self-pay | Admitting: Internal Medicine

## 2022-04-13 MED FILL — Medication: Qty: 1 | Status: AC

## 2022-04-23 NOTE — Procedures (Signed)
Extubation Procedure Note  Patients ETT and mouth suctioned pre extubation.  Patient Details:   Name: Donald Gaines DOB: Feb 11, 1941 MRN: [id number]   Airway Documentation:     Vent end date and time: 2022/04/30 at 1521  Evaluation  O2 sats: stable throughout Complications: No apparent complications Patient did tolerate procedure well. Bilateral Breath Sounds: Diminished, Expiratory wheezes   No, patient still under sedation for comfort. Placed on 3 L/m [place] post extubation.  Pete Pelt 04/30/2022, 3:26 PM

## 2022-04-23 NOTE — Progress Notes (Signed)
Progress Note   Patient: Donald Gaines [id number]:[id number] DOB: 1940-12-02 DOA: 03/31/2022     5 DOS: the patient was seen and examined on 2022/05/04   Brief hospital course: As per H&P written by Dr. Denton Brick on 04/15/2022 Donald Gaines is a 82 y.o. male with medical history significant for diabetes mellitus, atrial fibrillation, COPD, CHF, coronary artery disease, restrictive lung disease. Patient was brought to the ED with complaints of altered mental status and abdominal pain.  Per charge nurse, he was diagnosed with pneumonia last week and has been on antibiotics.  At the time of my evaluation, patient is somnolent, arouses, able to tell me his name but not answering questions.   ED Course: Hypothermic temperature 94.6.  Heart rate 76-94, respiratory rate 10-18.  Blood pressure systolic 1 76-808.  O2 sats 93 to 100% on 2 L. BNP elevated at 901. CT abdomen and pelvis lung base consistent with CHF, pulmonary edema, left lung base atelectasis, pneumonia not excluded, small amount of ascites and diffuse subcutaneous edema consistent with anasarca. Head CT negative for acute abnormality. 1 L bolus initially given, Protonix, morphine 4 mg, then 40 mg Lasix given. Hospitalist to admit for decompensated CHF and encephalopathy.  Assessment and Plan: 1)HFpEF/Acute on chronic diastolic (congestive) heart failure /with bilateral left pleural effusion left more than right -Presenting with abdominal distention, CT showing small ascites, diffuse subcutaneous edema, + trace bilateral lower extremity edema.  BNP elevated at 901.   -Currently on 3 L  -Echo shows-EF of 60 to 65%,  shows significant hypertrophy with basal lateral  hypokinesis and basal inferior hypokinesis.  Severe LVH, consider other imaging modality (MRI) to evaluate for possible amyloid, when clinically improved.  Severe left atrial enlargement and moderate left atrial enlargement, no aortic stenosis -Treated with IV Lasix  --Status post  thoracic transfusion on 04/03/2022 with removal of 280 mL of yellow fluid --Patient's sister [name], patient's older Sister [name] and patient's niece [name] also updated -04-May-2022-family at bedside requesting one-way extubation after talking with chaplain -Requesting Transition to full comfort care anticipate in-hospital death  2)Acute metabolic encephalopathy - Head CT negative for acute abnormality.  -Hypothermia has resolved  -No evidence of acute infection -??  If uremia related with worsening renal function Patient's sister [name], patient's older Sister [name] and patient's niece [name] also updated -04-May-2022-family at bedside requesting one-way extubation after talking with chaplain -Requesting Transition to full comfort care anticipate in-hospital death  3)Acute on chronic iron deficiency anemia due to chronic blood loss -Hgb is up to 8.0 from 6.5 after transfusion of 1 unit of PRBC on 04/01/2022 -No overt bleeding appreciated. -History of chronic GI blood loss-gastric and small bowel AVM's.  Push enteroscopy 11/07/2021 - several nonbleeding angioplastic in duodenum, treated with APC, clip.  Normal endoscopy 11/06/2021.   EGD on 03/29/2022 shows  Abnormal gastric mucosa of uncertain significance, Proximal duodenal AVM active bleeding?"sealed with APC. Pedunculated polyp proximal duodenum resected  -Treated with iv Protonix Patient's sister [name], patient's older Sister [name] and patient's niece [name] also updated -May 04, 2022-family at bedside requesting one-way extubation after talking with chaplain -Requesting Transition to full comfort care anticipate in-hospital death  4)Chronic kidney disease (CKD), stage IV (severe) ([name]) -Creatinine trending up currently 3.73, baseline usually just under 3  5)Permanent atrial fibrillation -post endoscopy patient went into RVR -Continue metoprolol for rate control -Severe left atrial and moderate right atrial enlargement noted -EF  is preserved as above -Treated with metoprolol -No anticoagulation secondary to  GIB.  6)COPD (chronic obstructive pulmonary disease) (HCC) -Stable overall. -Intubated and sedated on 04/03/2022 -Patient's sister [name], patient's older Sister [name] and patient's niece [name] also updated -2022-04-18-family at bedside requesting one-way extubation after talking with chaplain -Requesting Transition to full comfort care anticipate in-hospital death  7)Essential hypertension -PTA was on metoprolol and Lasix -Comfort care  8)Type 2 diabetes mellitus ([name]) -A1c 5.4.   -Comfort care  9)Generalized weakness and deconditioning--- physical therapy initially recommended SNF rehab -Comfort care  10)Social/Ethics- Intubated and sedated on 04/03/2022 Discussed with Patient's sister [name] and patient's niece [name] face-to-face in ICU , also updated patient's  Sister [name] by phone -Patient currently intubated and sedated -Family request no further escalation of care and DNR status -They will consider compassionate extubation over the next day or 2 if patient fails to improve  -Procedures 1)Intubated on 04/03/2022 2)Left-sided thoracentesis at bedside by Dr. Sabra Heck with 280 mL of yellow pleural fluid removed 3) EGD on 04/06/2022 4)Extubated on April 18, 2022  CRITICAL CARE Performed by: Roxan Hockey  Total critical care time: 57 minutes  Critical care time was exclusive of separately billable procedures and treating other patients. - Currently intubated, sedated and on Levophed for pressure support Critical care was necessary to treat or prevent imminent or life-threatening deterioration.  Critical care was time spent personally by me on the following activities: development of treatment plan with patient and/or surrogate as well as nursing, discussions with consultants, evaluation of patient's response to treatment, examination of patient, obtaining history from patient or surrogate,  ordering and performing treatments and interventions, ordering and review of laboratory studies, ordering and review of radiographic studies, pulse oximetry and re-evaluation of patient's condition.   Subjective:  --Patient's sister Matthe Sloane, patient's older Sister [name] and patient's niece [name] also updated -04-18-2022-family at bedside requesting one-way extubation after talking with chaplain -Requesting transition to full comfort care anticipate in-hospital death  Physical Exam: Vitals:   2022-04-18 1600 04-18-2022 1700 04/18/22 1800 04-18-2022 1841  BP:    (!) 71/53  Pulse:   (!) 164 (!) 140  Resp: 10 (!) 8 (!) 5 (!) 5  Temp:      TempSrc:      SpO2:   (!) 79% (!) 58%  Weight:      Height:       General exam: Extubated on April 18, 2022 at family request  Lungs--diminished on the left, scattered wheezes Heart-S1-S2, irregularly irregular and tachycardic Gastrointestinal system: Abdomen is nondistended, soft and  +BS NeuroPsych--receiving sedation under comfort care orders Extremities:-Good pedal pulses, pitting edema noted GU-Foley to be removed on 04/01/2022, re-inserted on  04/03/2022 after intubation  Family Communication: -Patient's sister Adham Johnson, patient's older Sister [name] and patient's niece [name] also updated -2022-04-18-family at bedside requesting one-way extubation after talking with chaplain -Requesting transition to full comfort care anticipate in-hospital death  Disposition: Currently sedated , comfort care anticipate in-hospital death status is: Inpatient  Author: Roxan Hockey, MD 04-18-2022 7:05 PM  For on call review [email or web address].

## 2022-04-23 NOTE — Discharge Summary (Signed)
Please see Death Summary  Patient's sister [name], patient's Sister [name] and patient's niece [name] updated -04/20/2022-family at bedside requesting one-way extubation after talking with chaplain -RequestedTransition to full comfort care anticipate in-hospital death -Patient expired on 2022-04-20 for at 1901 p.m.  Please see Death Summary  Roxan Hockey, MD

## 2022-04-23 NOTE — Progress Notes (Signed)
   Apr 06, 2022 1350  Spiritual Encounters  Type of Visit Initial  Care provided to: Pt and family  Conversation partners present during encounter Nurse  Reason for visit End-of-life  OnCall Visit Yes   Chaplain called to support family in end of life for pt with prayer.  Chaplain provided prayer with family.  Hubert Azure, Chaplain

## 2022-04-23 NOTE — Death Summary Note (Signed)
DEATH SUMMARY   Patient Details  Name: Donald Gaines MRN: [id number] DOB: [birth year] YQM:VHQI, Edwinna Areola, MD Admission/Discharge Information   Admit Date:  2022-03-31  Date of Death: Date of Death: Apr 05, 2022  Time of Death: Time of Death: 06/04/99  Length of Stay: 5   Principle Cause of death: Diastolic congestive heart failure  Hospital Diagnoses: Principal Problem:   Acute on chronic diastolic (congestive) heart failure (HCC) Active Problems:   Iron deficiency anemia due to chronic blood loss   Acute metabolic encephalopathy   COPD (chronic obstructive pulmonary disease) ([place])   Permanent atrial fibrillation (HCC)   Chronic kidney disease (CKD), stage IV (severe) ([place])   Type 2 diabetes mellitus ([place])   Essential hypertension   AVM (arteriovenous malformation) of small bowel, acquired with hemorrhage   ABLA (acute blood loss anemia)   Hospital Course: Patient's sister Miran Kautzman, patient's Sister [name] and patient's niece [name] updated -04/05/22-family at bedside requesting one-way extubation after talking with chaplain -RequestedTransition to full comfort care anticipate in-hospital death -Patient expired on Apr 05, 2022 for at 1901 p.m.  A/p 1)HFpEF/Acute on chronic Diastolic (congestive) heart failure /with bilateral left pleural effusion left more than right -Presenting with abdominal distention, CT showing small ascites, diffuse subcutaneous edema, + trace bilateral lower extremity edema.  BNP elevated at 901.   -Currently on 3 L  -Echo shows-EF of 60 to 65%,  shows significant hypertrophy with basal lateral  hypokinesis and basal inferior hypokinesis.  Severe LVH, consider other imaging modality (MRI) to evaluate for possible amyloid, when clinically improved.  Severe left atrial enlargement and moderate left atrial enlargement, no aortic stenosis -Treated with IV Lasix  --Status post thoracentensis on 04/03/2022 with removal of 280 mL of yellow Pleural  fluid --Patient's sister Muneer Leider, patient's Sister [name] and patient's niece [name] updated -05-Apr-2022-family at bedside requesting one-way extubation after talking with chaplain -RequestedTransition to full comfort care anticipate in-hospital death -Patient expired on Apr 05, 2022 for at 1901 p.m.   2)Acute metabolic encephalopathy - Head CT negative for acute abnormality.  -Hypothermia has resolved  -No evidence of acute infection -??  If uremia related with worsening renal function Patient's sister Rehan Holness, patient's Sister [name] and patient's niece [name] updated -04-05-22-family at bedside requesting one-way extubation after talking with chaplain -RequestedTransition to full comfort care anticipate in-hospital death -Patient expired on Apr 05, 2022 for at 1901 p.m.   3)Acute on chronic iron deficiency anemia due to chronic blood loss -Hgb is up to 8.0 from 6.5 after transfusion of 1 unit of PRBC on 04/01/2022 -No overt bleeding appreciated. -History of chronic GI blood loss-gastric and small bowel AVM's.  Push enteroscopy 11/07/2021 - several nonbleeding angioplastic in duodenum, treated with APC, clip.  Normal endoscopy 11/06/2021.   EGD on 04/05/2022 shows  Abnormal gastric mucosa of uncertain significance, Proximal duodenal AVM active bleeding?"sealed with APC. Pedunculated polyp proximal duodenum resected  -Treated with iv Protonix Patient's sister Kesean Serviss, patient's Sister [name] and patient's niece [name] updated -04/05/2022-family at bedside requesting one-way extubation after talking with chaplain -RequestedTransition to full comfort care anticipate in-hospital death -Patient expired on 04-05-22 for at 1901 p.m.   4)Chronic kidney disease (CKD), stage IV (severe) (HCC) -baseline usually just under 3 - comfort care   5)Permanent atrial fibrillation -post endoscopy patient went into RVR -Continue metoprolol for rate control -Severe left atrial and moderate right atrial  enlargement noted -EF is preserved as above -Treated with metoprolol -No anticoagulation secondary to GIB.   6)COPD (chronic obstructive pulmonary  disease) ([place]) -Stable overall. -Intubated and sedated on 04/03/2022 Patient's sister Jeanluc Wegman, patient's Sister [name] and patient's niece [name] updated -04/29/22-family at bedside requesting one-way extubation after talking with chaplain -RequestedTransition to full comfort care anticipate in-hospital death -Patient expired on 29-Apr-2022 for at 1901 p.m. 7)Essential hypertension -PTA was on metoprolol and Lasix -Comfort care   8)Type 2 diabetes mellitus (HCC) -A1c 5.4.   -Comfort care   9)Generalized weakness and deconditioning--- physical therapy initially recommended SNF rehab -Comfort care   10)Social/Ethics- Intubated and sedated on 04/03/2022 Discussed with Patient's sister Dominico Rod and patient's niece [name] face-to-face in ICU , also updated patient's  Sister [name] by phone -April 29, 2022-family at bedside requesting one-way extubation after talking with chaplain -RequestedTransition to full comfort care anticipate in-hospital death -Patient expired on April 29, 2022 for at 1901 p.m.   -Procedures 1)Intubated on 04/03/2022 2)Left-sided thoracentesis at bedside by Dr. Sabra Heck with 280 mL of yellow pleural fluid removed 3) EGD on 03/25/2022 4)Extubated on April 29, 2022   The results of significant diagnostics from this hospitalization (including imaging, microbiology, ancillary and laboratory) are listed below for reference.   Significant Diagnostic Studies: DG Abd Portable 1V  Result Date: 04/03/2022 CLINICAL DATA:  Check gastric catheter placement EXAM: PORTABLE ABDOMEN - 1 VIEW COMPARISON:  None Available. FINDINGS: Gastric catheter extends into the stomach. Scattered large and small bowel gas is noted. No free air is seen. IMPRESSION: Gastric catheter within the stomach. Electronically Signed   By: Inez Catalina M.D.   On: 04/03/2022  19:12   DG CHEST PORT 1 VIEW  Result Date: 04/03/2022 CLINICAL DATA:  Shortness of breath, post intubation EXAM: PORTABLE CHEST 1 VIEW COMPARISON:  03/23/2022 FINDINGS: Endotracheal tube is 3 cm above the carina. Heart is normal size. Improved aeration in the left lung. Small bilateral pleural effusions suspected. Diffuse bilateral interstitial and airspace opacities could reflect edema or infection. IMPRESSION: Endotracheal tube 3 cm above the carina. Diffuse bilateral interstitial and airspace opacities, edema versus infection. Small bilateral effusions. Electronically Signed   By: Rolm Baptise M.D.   On: 04/03/2022 18:32   ECHOCARDIOGRAM COMPLETE  Result Date: 04/03/2022    ECHOCARDIOGRAM REPORT   Patient Name:   Donald Gaines Date of Exam: 04/03/2022 Medical Rec #:  [id number]        Height:       65.0 in Accession #:    [id number]       Weight:       179.0 lb Date of Birth:  October 06, 1940       BSA:          1.887 m Patient Age:    48 years         BP:           110/74 mmHg Patient Gender: M                HR:           104 bpm. Exam Location:  [facility] Procedure: 2D Echo, Cardiac Doppler, Color Doppler and Intracardiac            Opacification Agent Indications:    CHF  History:        Patient has prior history of Echocardiogram examinations, most                 recent 06/03/2020. CHF, CAD, COPD, Arrythmias:Atrial                 Fibrillation, Signs/Symptoms:Chest Pain and Bacteremia; Risk  Factors:Hypertension, Diabetes, Dyslipidemia and Current Smoker.  Sonographer:    Wenda Low Referring Phys: [phone] Leanne Chang [facility]  Sonographer Comments: Image acquisition challenging due to COPD. IMPRESSIONS  1. Left ventricular ejection fraction, by estimation, is 55 to 60%. The left ventricle has grossly normal function. Left ventricular endocardial border not optimally defined to evaluate regional wall motion despite the use of Definity contrast. There is  severe left ventricular  hypertrophy. Consider outpatient cardiac MRI for further work-up of left ventricular hypertrophy. Left ventricular diastolic function could not be evaluated.  2. Right ventricular systolic function was not well visualized. The right ventricular size is not well visualized. There is mildly elevated pulmonary artery systolic pressure. The estimated right ventricular systolic pressure is 95.0 mmHg.  3. Left atrial size was severely dilated.  4. Large pleural effusion in the left lateral region.  5. The mitral valve was not well visualized. Trivial mitral valve regurgitation. No evidence of mitral stenosis.  6. The aortic valve was not well visualized. There is mild calcification of the aortic valve. Aortic valve regurgitation is not visualized. No aortic stenosis is present.  7. The inferior vena cava is dilated in size with <50% respiratory variability, suggesting right atrial pressure of 15 mmHg. Comparison(s): No significant change from prior study. FINDINGS  Left Ventricle: Left ventricular ejection fraction, by estimation, is 55 to 60%. The left ventricle has normal function. Left ventricular endocardial border not optimally defined to evaluate regional wall motion. Definity contrast agent was given IV to delineate the left ventricular endocardial borders. The left ventricular internal cavity size was normal in size. There is severe left ventricular hypertrophy. Left ventricular diastolic function could not be evaluated due to atrial fibrillation. Left ventricular diastolic function could not be evaluated. Right Ventricle: The right ventricular size is not well visualized. Right vetricular wall thickness was not well visualized. Right ventricular systolic function was not well visualized. There is mildly elevated pulmonary artery systolic pressure. The tricuspid regurgitant velocity is 2.42 m/s, and with an assumed right atrial pressure of 15 mmHg, the estimated right ventricular systolic pressure is 93.2 mmHg.  Left Atrium: Left atrial size was severely dilated. Right Atrium: Right atrial size was not well visualized. Pericardium: There is no evidence of pericardial effusion. Mitral Valve: The mitral valve was not well visualized. Trivial mitral valve regurgitation. No evidence of mitral valve stenosis. MV peak gradient, 5.2 mmHg. The mean mitral valve gradient is 2.0 mmHg. Tricuspid Valve: The tricuspid valve is not well visualized. Tricuspid valve regurgitation is mild . No evidence of tricuspid stenosis. Aortic Valve: The aortic valve was not well visualized. There is mild calcification of the aortic valve. Aortic valve regurgitation is not visualized. No aortic stenosis is present. Aortic valve mean gradient measures 3.0 mmHg. Aortic valve peak gradient  measures 6.4 mmHg. Aortic valve area, by VTI measures 2.40 cm. Pulmonic Valve: The pulmonic valve was not well visualized. Pulmonic valve regurgitation is not visualized. No evidence of pulmonic stenosis. Aorta: The aortic root is normal in size and structure. Venous: The inferior vena cava is dilated in size with less than 50% respiratory variability, suggesting right atrial pressure of 15 mmHg. IAS/Shunts: The interatrial septum was not well visualized. Additional Comments: There is a large pleural effusion in the left lateral region. Mild ascites is present.  LEFT VENTRICLE PLAX 2D LVIDd:         5.40 cm   Diastology LVIDs:         3.30 cm   LV  e' lateral:   10.20 cm/s LV PW:         1.60 cm   LV E/e' lateral: 8.2 LV IVS:        1.50 cm LVOT diam:     2.00 cm LV SV:         57 LV SV Index:   30 LVOT Area:     3.14 cm  LEFT ATRIUM              Index        RIGHT ATRIUM           Index LA diam:        5.40 cm  2.86 cm/m   RA Area:     19.20 cm LA Vol (A2C):   95.9 ml  50.82 ml/m  RA Volume:   49.00 ml  25.97 ml/m LA Vol (A4C):   124.0 ml 65.71 ml/m LA Biplane Vol: 119.0 ml 63.06 ml/m  AORTIC VALVE                    PULMONIC VALVE AV Area (Vmax):    2.74 cm      PV Vmax:       0.81 m/s AV Area (Vmean):   2.37 cm     PV Peak grad:  2.6 mmHg AV Area (VTI):     2.40 cm AV Vmax:           126.50 cm/s AV Vmean:          80.250 cm/s AV VTI:            0.238 m AV Peak Grad:      6.4 mmHg AV Mean Grad:      3.0 mmHg LVOT Vmax:         110.30 cm/s LVOT Vmean:        60.450 cm/s LVOT VTI:          0.182 m LVOT/AV VTI ratio: 0.76  AORTA Ao Root diam: 3.40 cm MITRAL VALVE               TRICUSPID VALVE MV Area (PHT): 4.04 cm    TR Peak grad:   23.4 mmHg MV Area VTI:   2.89 cm    TR Vmax:        242.00 cm/s MV Peak grad:  5.2 mmHg MV Mean grad:  2.0 mmHg    SHUNTS MV Vmax:       1.14 m/s    Systemic VTI:  0.18 m MV Vmean:      52.2 cm/s   Systemic Diam: 2.00 cm MV Decel Time: 188 msec MV E velocity: 84.00 cm/s Vishnu Priya Mallipeddi Electronically signed by Lorelee Cover Mallipeddi Signature Date/Time: 04/03/2022/3:58:26 PM    Final    DG CHEST PORT 1 VIEW  Result Date: 04/05/2022 CLINICAL DATA:  Dyspnea, altered mental status. EXAM: PORTABLE CHEST 1 VIEW COMPARISON:  04/10/2022 FINDINGS: Interval complete opacification of the left hemithorax. Previously aerated left upper lobe is either collapsed or consolidated. Large left pleural effusion. Suspected underlying enlargement of the cardiopericardial silhouette Interstitial accentuation in the right lung probably from interstitial edema. Mild blunting of the right lateral costophrenic angle compatible with a small right pleural effusion. A stent projects over the right cardiac shadow. Thoracic spondylosis. Prior median sternotomy. Atherosclerotic calcification of the aortic arch. IMPRESSION: 1. Interval completion of opacification of the left hemithorax with a large left pleural effusion. Previously aerated left upper lobe is either  collapsed or consolidated. 2. Interstitial accentuation in the right lung favors interstitial edema. 3. Suspected enlargement of the cardiopericardial silhouette. 4. Small right pleural effusion. 5.  Prior median sternotomy. 6.  Aortic Atherosclerosis (ICD10-I70.0). Electronically Signed   By: Van Clines M.D.   On: 04/10/2022 07:41   DG CHEST PORT 1 VIEW  Result Date: 04/12/2022 CLINICAL DATA:  Diastolic heart failure. Abdominal pain. Altered mental status EXAM: PORTABLE CHEST 1 VIEW COMPARISON:  Two-view chest x-ray 11/05/2021 and 01/10/2022. Numerous other exams as well. FINDINGS: Enlarged cardiopericardial silhouette with sternal wires. Increasing vascular congestion and interstitial changes. Moderate left effusion. No pneumothorax. Question tiny right effusion. Overlapping cardiac leads. IMPRESSION: Increasing vascular congestion and edema. Electronically Signed   By: Jill Side M.D.   On: 04/07/2022 17:40   CT Head Wo Contrast  Result Date: 04/19/2022 CLINICAL DATA:  Delirium.  Altered mental status. EXAM: CT HEAD WITHOUT CONTRAST TECHNIQUE: Contiguous axial images were obtained from the base of the skull through the vertex without intravenous contrast. RADIATION DOSE REDUCTION: This exam was performed according to the departmental dose-optimization program which includes automated exposure control, adjustment of the mA and/or kV according to patient size and/or use of iterative reconstruction technique. COMPARISON:  December 25, 2014. FINDINGS: Brain: No evidence of acute large vascular territory infarction, hemorrhage, hydrocephalus, extra-axial collection or mass lesion/mass effect. Patchy white matter hypodensities are nonspecific but compatible with chronic microvascular ischemic disease. Vascular: No hyperdense vessel.  Calcific atherosclerosis. Skull: No acute fracture. Sinuses/Orbits: Mild paranasal sinus mucosal thickening. Remote left medial orbital wall fracture. Other: No mastoid effusions. IMPRESSION: No evidence of acute intracranial abnormality. Electronically Signed   By: Margaretha Sheffield M.D.   On: 04/19/2022 14:48   CT ABDOMEN PELVIS WO CONTRAST  Result Date:  04/22/2022 CLINICAL DATA:  Abdominal pain. Altered mental status. Recent diagnosis of pneumonia. EXAM: CT ABDOMEN AND PELVIS WITHOUT CONTRAST TECHNIQUE: Multidetector CT imaging of the abdomen and pelvis was performed following the standard protocol without IV contrast. RADIATION DOSE REDUCTION: This exam was performed according to the departmental dose-optimization program which includes automated exposure control, adjustment of the mA and/or kV according to patient size and/or use of iterative reconstruction technique. COMPARISON:  07/22/2008. FINDINGS: Lower chest: Heart mildly enlarged. Dense three-vessel coronary artery calcifications. Previous CABG surgery. No pericardial effusion. Small left and trace right pleural effusions. Calcified pleural plaque posteriorly at the right lung base. Left lung base, predominantly lower lobe, atelectasis. Bilateral interstitial thickening with intervening hazy airspace opacities suspected to be pulmonary edema. Hepatobiliary: Normal liver. Dependent gallstone. No findings of acute cholecystitis. No bile duct dilation. Pancreas: Unremarkable. No pancreatic ductal dilatation or surrounding inflammatory changes. Spleen: Normal in size without focal abnormality. Adrenals/Urinary Tract: 1.1 cm low-attenuation left adrenal mass, average Hounsfield units of 9, consistent with an adenoma, with without significant change. No follow-up recommended. Normal right adrenal gland. Bilateral renal cortical thinning. A few small low-attenuation renal masses consistent with cysts. No follow-up recommended. Nonobstructing stone in the midpole the right kidney. No hydronephrosis. Ureters normal in course and in caliber. Bladder is unremarkable. Stomach/Bowel: Stomach is within normal limits. Appendix appears normal. No evidence of bowel wall thickening, distention, or inflammatory changes. Vascular/Lymphatic: Dense aortic atherosclerotic calcifications. No aneurysm. No enlarged lymph nodes.  Reproductive: Unremarkable. Other: Small amount of ascites.  Diffuse subcutaneous edema. Musculoskeletal: No fracture or acute finding.  No bone lesion. IMPRESSION: 1. Lung base findings consistent with congestive heart failure with pulmonary edema, small left and trace right pleural effusions. Additional  left lung base opacity is consistent with atelectasis. A component of pneumonia is not excluded, however. 2. Small amount of ascites and diffuse subcutaneous edema consistent with anasarca. 3. No convincing acute abnormality within the abdomen or pelvis. 4. Cholelithiasis. Small nonobstructing stone the right kidney. Dense aortic atherosclerotic calcifications. Electronically Signed   By: Lajean Manes M.D.   On: 03/26/2022 13:21    Microbiology: Recent Results (from the past 240 hour(s))  Blood culture (routine x 2)     Status: None   Collection Time: 04/12/2022 12:03 PM   Specimen: BLOOD  Result Value Ref Range Status   Specimen Description BLOOD RIGHT ASSIST CONTROL  Final   Special Requests   Final    Blood Culture adequate volume BOTTLES DRAWN AEROBIC AND ANAEROBIC   Culture   Final    NO GROWTH 5 DAYS Performed at [facility], [address] [place], [place] [address]    Report Status 2022-04-15 FINAL  Final  Blood culture (routine x 2)     Status: None   Collection Time: 03/29/2022 12:03 PM   Specimen: BLOOD  Result Value Ref Range Status   Specimen Description BLOOD LEFT ASSIST CONTROL  Final   Special Requests   Final    BOTTLES DRAWN AEROBIC AND ANAEROBIC Blood Culture results may not be optimal due to an inadequate volume of blood received in culture bottles   Culture   Final    NO GROWTH 5 DAYS Performed at [facility], [address] [place], [place] [address]    Report Status Apr 15, 2022 FINAL  Final  MRSA Next Gen by PCR, Nasal     Status: None   Collection Time: 03/31/22  3:00 PM   Specimen: Nasal Mucosa; Nasal Swab  Result Value Ref Range Status   MRSA by PCR Next Gen  NOT DETECTED NOT DETECTED Final    Comment: (NOTE) The GeneXpert MRSA Assay (FDA approved for NASAL specimens only), is one component of a comprehensive MRSA colonization surveillance program. It is not intended to diagnose MRSA infection nor to guide or monitor treatment for MRSA infections. Test performance is not FDA approved in patients less than 50 years old. Performed at [facility], [address] [place], [place] [address]   Culture, body fluid w Gram Stain-bottle     Status: None (Preliminary result)   Collection Time: 04/03/22  5:35 PM   Specimen: Pleura  Result Value Ref Range Status   Specimen Description PLEURAL BOTTLES DRAWN AEROBIC AND ANAEROBIC  Final   Special Requests Blood Culture adequate volume  Final   Gram Stain   Final    GRAM POSITIVE COCCI Gram Stain Report Called to,Read Back By and Verified With: G. ROOS @ [id number] BY STEPHTR 04/05/22 Performed at [facility], [address] [place],  [address]    Culture PENDING  Incomplete   Report Status PENDING  Incomplete   Total time 39 Minutes Signed: Roxan Hockey, MD April 15, 2022

## 2022-04-23 NOTE — Progress Notes (Signed)
Patient's sister Leib Elahi, patient's older Sister [name] and patient's niece [name] also updated -04-09-2022-family at bedside requesting one-way extubation after talking with chaplain -Requesting Transition to full comfort care anticipate in-hospital death  Roxan Hockey, MD

## 2022-04-23 NOTE — Progress Notes (Signed)
Patient previously transitioned to comfort care. Patients monitor read asystole.  Patient had 2 nurses, Juleen China and Iona Beard RN, listen and feel for signs of life for one minute.  No pulse or chest rise was palpated and no heart beat or lung sounds auscultated.  Patient pronounced dead at 52.  Family was at bedside.  Physician notified and [facility] contacted.  Post mortem care began.

## 2022-04-23 DEATH — deceased

## 2022-04-30 ENCOUNTER — Ambulatory Visit: Payer: Medicare HMO | Admitting: Cardiology
